# Patient Record
Sex: Female | Born: 1975
Health system: Southern US, Community
[De-identification: ages and names within clinical notes are randomized; demographics above are authoritative.]

## PROBLEM LIST (undated history)

## (undated) DIAGNOSIS — E039 Hypothyroidism, unspecified: Secondary | ICD-10-CM

## (undated) DIAGNOSIS — F329 Major depressive disorder, single episode, unspecified: Secondary | ICD-10-CM

## (undated) DIAGNOSIS — G4730 Sleep apnea, unspecified: Secondary | ICD-10-CM

## (undated) DIAGNOSIS — I10 Essential (primary) hypertension: Secondary | ICD-10-CM

## (undated) DIAGNOSIS — E079 Disorder of thyroid, unspecified: Secondary | ICD-10-CM

## (undated) DIAGNOSIS — IMO0001 Reserved for inherently not codable concepts without codable children: Secondary | ICD-10-CM

## (undated) DIAGNOSIS — F419 Anxiety disorder, unspecified: Secondary | ICD-10-CM

## (undated) DIAGNOSIS — Z460 Encounter for fitting and adjustment of spectacles and contact lenses: Secondary | ICD-10-CM

## (undated) DIAGNOSIS — Z9221 Personal history of antineoplastic chemotherapy: Secondary | ICD-10-CM

## (undated) DIAGNOSIS — IMO0002 Reserved for concepts with insufficient information to code with codable children: Secondary | ICD-10-CM

## (undated) DIAGNOSIS — D219 Benign neoplasm of connective and other soft tissue, unspecified: Secondary | ICD-10-CM

## (undated) DIAGNOSIS — E669 Obesity, unspecified: Secondary | ICD-10-CM

## (undated) DIAGNOSIS — D649 Anemia, unspecified: Secondary | ICD-10-CM

## (undated) DIAGNOSIS — C801 Malignant (primary) neoplasm, unspecified: Secondary | ICD-10-CM

## (undated) DIAGNOSIS — K219 Gastro-esophageal reflux disease without esophagitis: Secondary | ICD-10-CM

## (undated) DIAGNOSIS — F32A Depression, unspecified: Secondary | ICD-10-CM

## (undated) DIAGNOSIS — Z923 Personal history of irradiation: Secondary | ICD-10-CM

## (undated) HISTORY — DX: Reserved for concepts with insufficient information to code with codable children: IMO0002

## (undated) HISTORY — DX: Anemia, unspecified: D64.9

## (undated) HISTORY — PX: BREAST SURGERY: SHX581

## (undated) HISTORY — DX: Reserved for inherently not codable concepts without codable children: IMO0001

## (undated) HISTORY — DX: Disorder of thyroid, unspecified: E07.9

## (undated) HISTORY — DX: Obesity, unspecified: E66.9

## (undated) HISTORY — PX: WISDOM TOOTH EXTRACTION: SHX21

## (undated) HISTORY — DX: Gastro-esophageal reflux disease without esophagitis: K21.9

---

## 2001-01-26 HISTORY — PX: WISDOM TOOTH EXTRACTION: SHX21

## 2007-09-18 ENCOUNTER — Emergency Department (HOSPITAL_COMMUNITY): Admission: EM | Admit: 2007-09-18 | Discharge: 2007-09-18 | Payer: Self-pay | Admitting: Emergency Medicine

## 2009-10-21 ENCOUNTER — Other Ambulatory Visit: Payer: Self-pay | Admitting: Emergency Medicine

## 2009-10-21 ENCOUNTER — Other Ambulatory Visit: Payer: Self-pay | Admitting: Family Medicine

## 2009-10-21 ENCOUNTER — Observation Stay (HOSPITAL_COMMUNITY)
Admission: AD | Admit: 2009-10-21 | Discharge: 2009-10-23 | Payer: Self-pay | Source: Home / Self Care | Admitting: Obstetrics and Gynecology

## 2010-01-26 HISTORY — PX: UTERINE FIBROID SURGERY: SHX826

## 2010-01-26 HISTORY — PX: OTHER SURGICAL HISTORY: SHX169

## 2010-01-28 ENCOUNTER — Encounter
Admission: RE | Admit: 2010-01-28 | Discharge: 2010-01-28 | Payer: Self-pay | Source: Home / Self Care | Attending: Obstetrics and Gynecology | Admitting: Obstetrics and Gynecology

## 2010-02-06 ENCOUNTER — Encounter
Admission: RE | Admit: 2010-02-06 | Discharge: 2010-02-06 | Payer: Self-pay | Source: Home / Self Care | Attending: Obstetrics and Gynecology | Admitting: Obstetrics and Gynecology

## 2010-02-24 ENCOUNTER — Other Ambulatory Visit (HOSPITAL_COMMUNITY): Payer: Self-pay | Admitting: Interventional Radiology

## 2010-02-24 DIAGNOSIS — D219 Benign neoplasm of connective and other soft tissue, unspecified: Secondary | ICD-10-CM

## 2010-02-26 ENCOUNTER — Other Ambulatory Visit (HOSPITAL_COMMUNITY): Payer: Self-pay

## 2010-03-05 ENCOUNTER — Ambulatory Visit (HOSPITAL_COMMUNITY): Admission: RE | Admit: 2010-03-05 | Payer: 59 | Source: Home / Self Care | Admitting: Obstetrics and Gynecology

## 2010-03-07 ENCOUNTER — Ambulatory Visit (HOSPITAL_COMMUNITY)
Admission: RE | Admit: 2010-03-07 | Discharge: 2010-03-07 | Disposition: A | Discharge status: Home / Self Care | Payer: 59 | Source: Ambulatory Visit | Attending: Interventional Radiology | Admitting: Interventional Radiology

## 2010-03-07 DIAGNOSIS — D259 Leiomyoma of uterus, unspecified: Secondary | ICD-10-CM | POA: Insufficient documentation

## 2010-03-07 DIAGNOSIS — Z01812 Encounter for preprocedural laboratory examination: Secondary | ICD-10-CM | POA: Insufficient documentation

## 2010-03-07 LAB — CBC
HCT: 42.1 % (ref 36.0–46.0)
Hemoglobin: 14.4 g/dL (ref 12.0–15.0)
WBC: 5.9 10*3/uL (ref 4.0–10.5)

## 2010-03-07 LAB — CREATININE, SERUM: GFR calc non Af Amer: >60 mL/min (ref >60)

## 2010-03-10 ENCOUNTER — Observation Stay (HOSPITAL_COMMUNITY)
Admission: RE | Admit: 2010-03-10 | Discharge: 2010-03-11 | Disposition: A | Discharge status: Home / Self Care | Payer: 59 | Source: Ambulatory Visit | Attending: Interventional Radiology | Admitting: Interventional Radiology

## 2010-03-10 ENCOUNTER — Encounter (HOSPITAL_COMMUNITY): Payer: Self-pay

## 2010-03-10 DIAGNOSIS — N920 Excessive and frequent menstruation with regular cycle: Secondary | ICD-10-CM | POA: Insufficient documentation

## 2010-03-10 DIAGNOSIS — D259 Leiomyoma of uterus, unspecified: Principal | ICD-10-CM | POA: Insufficient documentation

## 2010-03-10 DIAGNOSIS — D219 Benign neoplasm of connective and other soft tissue, unspecified: Secondary | ICD-10-CM

## 2010-03-10 HISTORY — DX: Benign neoplasm of connective and other soft tissue, unspecified: D21.9

## 2010-03-10 MED ORDER — IOHEXOL 300 MG/ML  SOLN
200.0000 mL | Freq: Once | INTRAMUSCULAR | Status: AC | PRN
  Administered 2010-03-10: 150 mL via INTRAVENOUS

## 2010-03-10 MED ORDER — IOHEXOL 300 MG/ML  SOLN: 150.0000 mL | Freq: Once | INTRAMUSCULAR | Status: AC | PRN

## 2010-03-12 ENCOUNTER — Other Ambulatory Visit: Payer: Self-pay | Admitting: Interventional Radiology

## 2010-03-12 DIAGNOSIS — D219 Benign neoplasm of connective and other soft tissue, unspecified: Secondary | ICD-10-CM

## 2010-03-13 ENCOUNTER — Telehealth: Payer: Self-pay | Admitting: Radiology

## 2010-03-25 ENCOUNTER — Ambulatory Visit
Admission: RE | Admit: 2010-03-25 | Discharge: 2010-03-25 | Disposition: A | Discharge status: Home / Self Care | Payer: 59 | Source: Ambulatory Visit | Attending: Interventional Radiology | Admitting: Interventional Radiology

## 2010-03-25 VITALS — BP 153/89 | HR 62 | Temp 99.3°F | Resp 14

## 2010-03-25 DIAGNOSIS — D219 Benign neoplasm of connective and other soft tissue, unspecified: Secondary | ICD-10-CM

## 2010-03-26 NOTE — Progress Notes (Signed)
Patient states that she has been taking IBU 400 mg x 7 days for mild discomfort.  Has been experiencing some spotting, denies foul odor.  Afebrile.

## 2010-04-10 LAB — URINALYSIS, ROUTINE W REFLEX MICROSCOPIC
Bilirubin Urine: NEGATIVE
Glucose, UA: NEGATIVE mg/dL
Ketones, ur: 15 mg/dL — AB
pH: 6 (ref 5.0–8.0)

## 2010-04-10 LAB — BASIC METABOLIC PANEL
CO2: 15 mEq/L — ABNORMAL LOW (ref 19–32)
Chloride: 110 mEq/L (ref 96–112)
GFR calc Af Amer: >60 mL/min (ref >60)
Potassium: 3.3 mEq/L — ABNORMAL LOW (ref 3.5–5.1)
Sodium: 138 mEq/L (ref 135–145)

## 2010-04-10 LAB — CBC
HCT: 12.3 % — ABNORMAL LOW (ref 36.0–46.0)
HCT: 22.4 % — ABNORMAL LOW (ref 36.0–46.0)
Hemoglobin: 1.7 g/dL — CL (ref 12.0–15.0)
MCH: 13.5 pg — ABNORMAL LOW (ref 26.0–34.0)
MCH: 25.4 pg — ABNORMAL LOW (ref 26.0–34.0)
MCHC: 32.4 g/dL (ref 30.0–36.0)
MCV: 50.8 fL — ABNORMAL LOW (ref 78.0–100.0)
MCV: 77.1 fL — ABNORMAL LOW (ref 78.0–100.0)
Platelets: 353 10*3/uL (ref 150–400)
Platelets: 327 10*3/uL (ref 150–400)
Platelets: 364 10*3/uL (ref 150–400)
RBC: 1.24 MIL/uL — ABNORMAL LOW (ref 3.87–5.11)
RBC: 2.91 MIL/uL — ABNORMAL LOW (ref 3.87–5.11)
RDW: 37.9 % — ABNORMAL HIGH (ref 11.5–15.5)
RDW: 32 % — ABNORMAL HIGH (ref 11.5–15.5)
RDW: 36 % — ABNORMAL HIGH (ref 11.5–15.5)
WBC: 4.3 10*3/uL (ref 4.0–10.5)
WBC: 4.3 10*3/uL (ref 4.0–10.5)
WBC: 4.7 10*3/uL (ref 4.0–10.5)

## 2010-04-10 LAB — DIFFERENTIAL
Basophils Absolute: 0 10*3/uL (ref 0.0–0.1)
Basophils Relative: 0 % (ref 0–1)
Lymphocytes Relative: 26 % (ref 12–46)
Neutro Abs: 3.1 10*3/uL (ref 1.7–7.7)
Neutrophils Relative %: 67 % (ref 43–77)

## 2010-04-10 LAB — COMPREHENSIVE METABOLIC PANEL
Albumin: 3.1 g/dL — ABNORMAL LOW (ref 3.5–5.2)
Alkaline Phosphatase: 49 U/L (ref 39–117)
BUN: 3 mg/dL — ABNORMAL LOW (ref 6–23)
Chloride: 116 mEq/L — ABNORMAL HIGH (ref 96–112)
Creatinine, Ser: 0.72 mg/dL (ref 0.4–1.2)
GFR calc non Af Amer: >60 mL/min (ref >60)
Glucose, Bld: 90 mg/dL (ref 70–99)
Potassium: 3.5 mEq/L (ref 3.5–5.1)
Total Bilirubin: 2.9 mg/dL — ABNORMAL HIGH (ref 0.3–1.2)

## 2010-04-10 LAB — CROSSMATCH
ABO/RH(D): O POS
Antibody Screen: NEGATIVE

## 2010-04-10 LAB — ABO/RH
ABO/RH(D): O POS
ABO/RH(D): O POS

## 2010-04-10 LAB — HEMOGLOBINOPATHY EVALUATION
Hemoglobin Other: 0 % (ref 0.0–0.0)
Hgb A2 Quant: 2.5 % (ref 2.2–3.2)

## 2010-04-10 LAB — PROTIME-INR
INR: 1.14 (ref 0.00–1.49)
Prothrombin Time: 14.8 seconds (ref 11.6–15.2)

## 2010-04-10 LAB — POCT PREGNANCY, URINE: Preg Test, Ur: NEGATIVE

## 2010-05-14 ENCOUNTER — Telehealth: Payer: Self-pay | Admitting: Radiology

## 2010-06-05 ENCOUNTER — Telehealth: Payer: Self-pay | Admitting: Radiology

## 2010-06-05 NOTE — Telephone Encounter (Signed)
Left phone message requesting pt call re: s/p [place]

## 2010-06-10 ENCOUNTER — Other Ambulatory Visit: Payer: Self-pay | Admitting: Interventional Radiology

## 2010-06-10 DIAGNOSIS — D219 Benign neoplasm of connective and other soft tissue, unspecified: Secondary | ICD-10-CM

## 2010-06-17 ENCOUNTER — Ambulatory Visit
Admission: RE | Admit: 2010-06-17 | Discharge: 2010-06-17 | Disposition: A | Discharge status: Home / Self Care | Payer: 59 | Source: Ambulatory Visit | Attending: Interventional Radiology | Admitting: Interventional Radiology

## 2010-06-17 VITALS — BP 140/85 | HR 59 | Resp 14

## 2010-06-17 DIAGNOSIS — D219 Benign neoplasm of connective and other soft tissue, unspecified: Secondary | ICD-10-CM

## 2010-07-04 NOTE — Telephone Encounter (Signed)
See above telephone encounter note

## 2010-07-17 NOTE — Telephone Encounter (Signed)
See above note

## 2010-08-19 ENCOUNTER — Other Ambulatory Visit: Payer: Self-pay | Admitting: Interventional Radiology

## 2010-08-19 DIAGNOSIS — D219 Benign neoplasm of connective and other soft tissue, unspecified: Secondary | ICD-10-CM

## 2010-09-01 ENCOUNTER — Other Ambulatory Visit: Payer: Self-pay | Admitting: Obstetrics and Gynecology

## 2010-09-01 DIAGNOSIS — D219 Benign neoplasm of connective and other soft tissue, unspecified: Secondary | ICD-10-CM

## 2010-09-23 ENCOUNTER — Ambulatory Visit
Admission: RE | Admit: 2010-09-23 | Discharge: 2010-09-23 | Disposition: A | Discharge status: Home / Self Care | Payer: 59 | Source: Ambulatory Visit | Attending: Interventional Radiology | Admitting: Interventional Radiology

## 2010-09-23 ENCOUNTER — Ambulatory Visit
Admission: RE | Admit: 2010-09-23 | Discharge: 2010-09-23 | Disposition: A | Discharge status: Home / Self Care | Payer: 59 | Source: Ambulatory Visit | Attending: Obstetrics and Gynecology | Admitting: Obstetrics and Gynecology

## 2010-09-23 VITALS — BP 155/88 | HR 61 | Temp 98.1°F | Resp 16

## 2010-09-23 DIAGNOSIS — D219 Benign neoplasm of connective and other soft tissue, unspecified: Secondary | ICD-10-CM

## 2010-09-23 MED ORDER — GADOBENATE DIMEGLUMINE 529 MG/ML IV SOLN
20.0000 mL | Freq: Once | INTRAVENOUS | Status: AC | PRN
  Administered 2010-09-23: 20 mL via INTRAVENOUS

## 2010-09-23 NOTE — Progress Notes (Signed)
LMP:  09/11/2010.  Length of menstrual cycles post [place]: 5-6 days, minimal small clots.  Minimal cramping w/ cycles.  Minimal occasional bloating.    States that she often experiences some cramping discomfort the week after cycles for several days.  Takes Ibuprofen as needed.

## 2011-01-27 HISTORY — PX: EYE SURGERY: SHX253

## 2011-01-28 ENCOUNTER — Encounter (INDEPENDENT_AMBULATORY_CARE_PROVIDER_SITE_OTHER): Payer: 59 | Admitting: Ophthalmology

## 2011-01-28 DIAGNOSIS — H43819 Vitreous degeneration, unspecified eye: Secondary | ICD-10-CM

## 2011-01-28 DIAGNOSIS — H33309 Unspecified retinal break, unspecified eye: Secondary | ICD-10-CM

## 2011-01-28 DIAGNOSIS — H2510 Age-related nuclear cataract, unspecified eye: Secondary | ICD-10-CM

## 2011-01-28 DIAGNOSIS — H35419 Lattice degeneration of retina, unspecified eye: Secondary | ICD-10-CM

## 2011-01-28 DIAGNOSIS — H5210 Myopia, unspecified eye: Secondary | ICD-10-CM

## 2011-02-04 ENCOUNTER — Ambulatory Visit (INDEPENDENT_AMBULATORY_CARE_PROVIDER_SITE_OTHER): Payer: 59 | Admitting: Ophthalmology

## 2011-02-04 DIAGNOSIS — H33309 Unspecified retinal break, unspecified eye: Secondary | ICD-10-CM

## 2011-02-09 ENCOUNTER — Encounter (INDEPENDENT_AMBULATORY_CARE_PROVIDER_SITE_OTHER): Payer: 59 | Admitting: Ophthalmology

## 2011-02-09 DIAGNOSIS — H33309 Unspecified retinal break, unspecified eye: Secondary | ICD-10-CM

## 2011-02-19 ENCOUNTER — Other Ambulatory Visit: Payer: Self-pay | Admitting: Obstetrics and Gynecology

## 2011-02-19 ENCOUNTER — Ambulatory Visit (INDEPENDENT_AMBULATORY_CARE_PROVIDER_SITE_OTHER): Payer: 59 | Admitting: Ophthalmology

## 2011-02-19 DIAGNOSIS — H33309 Unspecified retinal break, unspecified eye: Secondary | ICD-10-CM

## 2011-02-19 DIAGNOSIS — R928 Other abnormal and inconclusive findings on diagnostic imaging of breast: Secondary | ICD-10-CM

## 2011-02-20 ENCOUNTER — Ambulatory Visit
Admission: RE | Admit: 2011-02-20 | Discharge: 2011-02-20 | Disposition: A | Discharge status: Home / Self Care | Payer: 59 | Source: Ambulatory Visit | Attending: Obstetrics and Gynecology | Admitting: Obstetrics and Gynecology

## 2011-02-20 DIAGNOSIS — R928 Other abnormal and inconclusive findings on diagnostic imaging of breast: Secondary | ICD-10-CM

## 2011-06-19 ENCOUNTER — Ambulatory Visit (INDEPENDENT_AMBULATORY_CARE_PROVIDER_SITE_OTHER): Payer: 59 | Admitting: Ophthalmology

## 2011-06-26 ENCOUNTER — Ambulatory Visit (INDEPENDENT_AMBULATORY_CARE_PROVIDER_SITE_OTHER): Payer: 59 | Admitting: Ophthalmology

## 2011-06-26 DIAGNOSIS — H33309 Unspecified retinal break, unspecified eye: Secondary | ICD-10-CM

## 2011-06-26 DIAGNOSIS — H2510 Age-related nuclear cataract, unspecified eye: Secondary | ICD-10-CM

## 2011-06-26 DIAGNOSIS — H5210 Myopia, unspecified eye: Secondary | ICD-10-CM

## 2011-06-26 DIAGNOSIS — H43819 Vitreous degeneration, unspecified eye: Secondary | ICD-10-CM

## 2011-08-14 ENCOUNTER — Encounter: Payer: Self-pay | Admitting: Obstetrics and Gynecology

## 2012-06-30 ENCOUNTER — Ambulatory Visit (INDEPENDENT_AMBULATORY_CARE_PROVIDER_SITE_OTHER): Payer: 59 | Admitting: Ophthalmology

## 2012-07-06 ENCOUNTER — Ambulatory Visit (INDEPENDENT_AMBULATORY_CARE_PROVIDER_SITE_OTHER): Payer: 59 | Admitting: Ophthalmology

## 2012-07-06 DIAGNOSIS — H5210 Myopia, unspecified eye: Secondary | ICD-10-CM

## 2012-07-06 DIAGNOSIS — H43819 Vitreous degeneration, unspecified eye: Secondary | ICD-10-CM

## 2012-07-06 DIAGNOSIS — H33309 Unspecified retinal break, unspecified eye: Secondary | ICD-10-CM

## 2012-07-06 DIAGNOSIS — H35419 Lattice degeneration of retina, unspecified eye: Secondary | ICD-10-CM

## 2013-01-26 DIAGNOSIS — C801 Malignant (primary) neoplasm, unspecified: Secondary | ICD-10-CM

## 2013-01-26 HISTORY — DX: Malignant (primary) neoplasm, unspecified: C80.1

## 2013-06-26 ENCOUNTER — Other Ambulatory Visit: Payer: Self-pay | Admitting: Obstetrics and Gynecology

## 2013-06-26 DIAGNOSIS — R928 Other abnormal and inconclusive findings on diagnostic imaging of breast: Secondary | ICD-10-CM

## 2013-07-06 ENCOUNTER — Encounter (INDEPENDENT_AMBULATORY_CARE_PROVIDER_SITE_OTHER): Payer: Self-pay

## 2013-07-06 ENCOUNTER — Other Ambulatory Visit: Payer: Self-pay | Admitting: Obstetrics and Gynecology

## 2013-07-06 ENCOUNTER — Ambulatory Visit
Admission: RE | Admit: 2013-07-06 | Discharge: 2013-07-06 | Disposition: A | Discharge status: Home / Self Care | Payer: 59 | Source: Ambulatory Visit | Attending: Obstetrics and Gynecology | Admitting: Obstetrics and Gynecology

## 2013-07-06 DIAGNOSIS — R928 Other abnormal and inconclusive findings on diagnostic imaging of breast: Secondary | ICD-10-CM

## 2013-07-17 ENCOUNTER — Ambulatory Visit
Admission: RE | Admit: 2013-07-17 | Discharge: 2013-07-17 | Disposition: A | Discharge status: Home / Self Care | Payer: 59 | Source: Ambulatory Visit | Attending: Obstetrics and Gynecology | Admitting: Obstetrics and Gynecology

## 2013-07-17 DIAGNOSIS — R928 Other abnormal and inconclusive findings on diagnostic imaging of breast: Secondary | ICD-10-CM

## 2013-07-18 ENCOUNTER — Other Ambulatory Visit: Payer: Self-pay | Admitting: Obstetrics and Gynecology

## 2013-07-18 DIAGNOSIS — C50919 Malignant neoplasm of unspecified site of unspecified female breast: Secondary | ICD-10-CM

## 2013-07-24 ENCOUNTER — Telehealth: Payer: Self-pay | Admitting: Genetic Counselor

## 2013-07-24 ENCOUNTER — Ambulatory Visit (INDEPENDENT_AMBULATORY_CARE_PROVIDER_SITE_OTHER): Payer: 59 | Admitting: Surgery

## 2013-07-24 ENCOUNTER — Encounter (INDEPENDENT_AMBULATORY_CARE_PROVIDER_SITE_OTHER): Payer: Self-pay | Admitting: Surgery

## 2013-07-24 VITALS — BP 118/85 | HR 77 | Temp 97.8°F | Resp 16 | Ht 69.0 in | Wt 219.0 lb

## 2013-07-24 DIAGNOSIS — D0590 Unspecified type of carcinoma in situ of unspecified breast: Secondary | ICD-10-CM

## 2013-07-24 DIAGNOSIS — D0511 Intraductal carcinoma in situ of right breast: Secondary | ICD-10-CM

## 2013-07-24 NOTE — Patient Instructions (Signed)
Sentinel Lymph Node Biopsy Sentinel lymph node biopsy is a procedure in which a single lymph node is identified, removed, and examined for cancer. Lymph nodes are collections of tissue that help filter infections, cancer cells, and other waste substances from the bloodstream. Certain types of cancer can spread to nearby lymph nodes. The cancer spreads to one lymph node first, and then to others. The first lymph node that your cancer could spread to is called the sentinel lymph node. Examining the sentinel lymph node for cancer can help your caregiver plan future treatment for you. LET YOUR CAREGIVER KNOW ABOUT:   Allergies to food or medicine.  Medicines taken, including vitamins, herbs, eyedrops, over-the-counter medicines, and creams.  Use of steroids (by mouth or creams).  Previous problems with numbing medicines.  History of bleeding problems or blood clots.  Previous surgery.  Other health problems, including diabetes and kidney problems.  Possibility of pregnancy, if this applies. RISKS AND COMPLICATIONS   Infection.  Bleeding.  Allergic reaction to the dye used for the procedure.  Blue staining of the skin where the dye is injected.  Damaged lymph vessels, causing a buildup of fluid (lymphedema).  Pain or bruising at the biopsy site. BEFORE THE PROCEDURE   Stop smoking at least 2 weeks before the procedure. Not smoking will improve your health after the procedure and decrease the chance of getting a wound infection.  You may have blood tests to make sure your blood clots normally.  Ask your caregiver about changing or stopping your regular medicines.  Do not eat or drink anything for 8 hours before the procedure. PROCEDURE   You will be given medicine that makes you sleep (general anesthetic).  A blue, radioactive dye will be injected near the tumor. The dye will then spread into the sentinel lymph node.  A scanner will identify the sentinel lymph node.  A  small cut (incision) will be made, and the sentinel lymph node will be removed.  The sentinel lymph node will be examined in a lab. Sometimes, a sentinel lymph node biopsy is performed during another surgery, such as a mastectomy or lumpectomy for breast cancer.  AFTER THE PROCEDURE   You will go to a recovery room.  You will be monitored for several hours.  If complications do not occur, you will be allowed to go home a few hours after the procedure.  Your urine may be blue for the next 24 hours. This is normal. It is caused by the dye used during the procedure.  Your skin where the dye was injected may be blue for up to 8 weeks. Document Released: 04/06/2011 Document Reviewed: 04/06/2011 [facility] Patient Information 2015 [place]. This information is not intended to replace advice given to you by your health care provider. Make sure you discuss any questions you have with your health care provider. Lumpectomy A lumpectomy is a form of "breast conserving" or "breast preservation" surgery. It may also be referred to as a partial mastectomy. During a lumpectomy, the portion of the breast that contains the cancerous tumor or breast mass (the lump) is removed. Some normal tissue around the lump may also be removed to make sure all the tumor has been removed. This surgery should take 40 minutes or less. LET [facility] CARE PROVIDER KNOW ABOUT:  Any allergies you have.  All medicines you are taking, including vitamins, herbs, eye drops, creams, and over-the-counter medicines.  Previous problems you or members of your family have had with the  use of anesthetics.  Any blood disorders you have.  Previous surgeries you have had.  Medical conditions you have. RISKS AND COMPLICATIONS Generally, this is a safe procedure. However, as with any procedure, complications can occur. Possible complications include:  Bleeding.  Infection.  Pain.  Temporary swelling.  Change in the  shape of the breast, particularly if a large portion is removed. BEFORE THE PROCEDURE  Ask your health care provider about changing or stopping your regular medicines.  Do not eat or drink anything for 7-8 hours before the surgery or as directed by your health care provider. Ask your health care provider if you can take a sip of water with any approved medicines.  On the day of surgery, your healthcare provider will use a mammogram or ultrasound to locate and mark the tumor in your breast. These markings on your breast will show where the cut (incision) will be made. PROCEDURE   An IV tube will be put into one of your veins.  You may be given medicine to help you relax before the surgery (sedative). You will be given one of the following:  A medicine that numbs the area (local anesthesia).  A medicine that makes you go to sleep (general anesthesia).  Your health care provider will use a kind of electric scalpel that uses heat to minimize bleeding (electrocautery knife).  A curved incision (like a smile or frown) that follows the natural curve of your breast is made, to allow for minimal scarring and better healing.  The tumor will be removed with some of the surrounding tissue. This will be sent to the lab for analysis. Your health care provider may also remove your lymph nodes at this time if needed.  Sometimes, but not always, a rubber tube called a drain will be surgically inserted into your breast area or armpit to collect excess fluid that may accumulate in the space where the tumor was. This drain is connected to a plastic bulb on the outside of your body. This drain creates suction to help remove the fluid.  The incisions will be closed with stitches (sutures).  A bandage may be placed over the incisions. AFTER THE PROCEDURE  You will be taken to the recovery area.  You will be given medicine for pain.  A small rubber drain may be placed in the breast for 2-3 days to prevent  a collection of blood (hematoma) from developing in the breast. You will be given instructions on caring for the drain before you go home.  A pressure bandage (dressing) will be applied for 1-2 days to prevent bleeding. Ask your health care provider how to care for your bandage at home. Document Released: 02/23/2006 Document Revised: 09/14/2012 Document Reviewed: 06/17/2012 [facility] Patient Information 2015 [place], [place]. This information is not intended to replace advice given to you by your health care provider. Make sure you discuss any questions you have with your health care provider.

## 2013-07-24 NOTE — Progress Notes (Signed)
Patient ID: Jennifer Frazier, female   DOB: [birth year], 38 y.o.   MRN: [id number]  Chief Complaint  Patient presents with  . Mass    rt breast    HPI Jennifer Frazier is a 38 y.o. female.   HPI Patient sent at the request of Dr. Lovey Newcomer of the [place] for right breast microcalcifications. This is picked up on her second mammogram. The area measures 1 cm in maximal diameter in the upper-outer quadrant. These were felt to be suspicious were biopsied. Results showed DCIS with comedonecrosis high grade. Patient denies any history of breast pain, nipple discharge or breast mass. Patient has first degree and with breast cancer. She is to grandmothers with ovarian cancer.MRI set up for Tuesday.  Past Medical History  Diagnosis Date  . Fibroids     uterine fibroids  . Anemia   . Thyroid disease     Past Surgical History  Procedure Laterality Date  . [place]  2012  . Eye surgery  2013    Family History  Problem Relation Age of Onset  . Cancer Father     colon  . Cancer Maternal Aunt     breast cancer  . Cancer Paternal Aunt     breast cancer  . Cancer Maternal Grandmother     ovarian cancer  . Cancer Paternal Grandmother     ovarian cancer    Social History History  Substance Use Topics  . Smoking status: Never Smoker   . Smokeless tobacco: Not on file  . Alcohol Use: No    Allergies  Allergen Reactions  . Gadolinium      Desc: NAUSEA WITH MRI CONTRAST-NO VOMITING   . Tramadol Hives and Nausea Only    Current Outpatient Prescriptions  Medication Sig Dispense Refill  . Barberry-Oreg Grape-Goldenseal (BERBERINE COMPLEX PO) Take 500 mg by mouth 3 (three) times daily.      . Cholecalciferol (VITAMIN D3) 3000 UNITS TABS Take 10,000 Units by mouth daily.      . magnesium gluconate (MAGONATE) 500 MG tablet Take 400 mg by mouth Nightly.      . progesterone 200 MG SUPP Place 200 mg vaginally at bedtime.      . promethazine (PHENERGAN) 25 MG tablet Take 25 mg by  mouth every 6 (six) hours as needed for nausea or vomiting.      . thyroid (ARMOUR) 130 MG tablet Take 130 mg by mouth daily.      . vitamin B-12 (CYANOCOBALAMIN) 1000 MCG tablet Take 5,000 mcg by mouth daily.       No current facility-administered medications for this visit.    Review of Systems Review of Systems  Constitutional: Negative for fever, chills and unexpected weight change.  HENT: Negative for congestion, hearing loss, sore throat, trouble swallowing and voice change.   Eyes: Negative for visual disturbance.  Respiratory: Negative for cough and wheezing.   Cardiovascular: Negative for chest pain, palpitations and leg swelling.  Gastrointestinal: Negative for nausea, vomiting, abdominal pain, diarrhea, constipation, blood in stool, abdominal distention and anal bleeding.  Genitourinary: Negative for hematuria, vaginal bleeding and difficulty urinating.  Musculoskeletal: Negative for arthralgias.  Skin: Negative for rash and wound.  Neurological: Negative for seizures, syncope and headaches.  Hematological: Negative for adenopathy. Does not bruise/bleed easily.  Psychiatric/Behavioral: Negative for confusion.    Blood pressure 118/85, pulse 77, temperature 97.8 F (36.6 C), resp. rate 16, height 5\' 9"  (1.753 m), weight 219 lb (99.338 kg).  Physical  Exam Physical Exam  Constitutional: She is oriented to person, place, and time. She appears well-developed and well-nourished.  HENT:  Head: Normocephalic and atraumatic.  Eyes: Pupils are equal, round, and reactive to light. No scleral icterus.  Neck: Normal range of motion. Neck supple.  Cardiovascular: Normal rate and regular rhythm.   Pulmonary/Chest: Effort normal and breath sounds normal. Right breast exhibits no inverted nipple, no mass, no nipple discharge, no skin change and no tenderness. Left breast exhibits no inverted nipple, no mass, no nipple discharge, no skin change and no tenderness. Breasts are symmetrical.      Musculoskeletal: Normal range of motion.  Lymphadenopathy:    She has no cervical adenopathy.  Neurological: She is alert and oriented to person, place, and time.  Skin: Skin is warm and dry.  Psychiatric: She has a normal mood and affect. Her behavior is normal. Judgment and thought content normal.    Data Reviewed CLINICAL DATA: Patient recalled from screening for right breast  calcifications.  EXAM:  DIGITAL DIAGNOSTIC RIGHT MAMMOGRAM  COMPARISON: [facility]  ACR Breast Density Category c: The breast tissue is heterogeneously  dense, which may obscure small masses.  FINDINGS:  Within the upper-outer right breast posterior depth there is a 12 x  5 x 4 mm group of a amorphous calcifications, further evaluated with  spot compression magnification views.  IMPRESSION:  Suspicious right breast calcifications.  RECOMMENDATION:  Stereotactic guided core needle biopsy.  Biopsy scheduled for 07/17/2013 at 3 p.m.  I have discussed the findings and recommendations with the patient.  Results were also provided in writing at the conclusion of the  visit. If applicable, a reminder letter will be sent to the patient  regarding the next appointment.  BI-RADS CATEGORY 4: Suspicious.  Electronically Signed  By: Lovey Newcomer M.D.  On: 07/06/2013 15:32    Assessment    Right breast DCIS with comedonecrosis high-grade upper outer quadrant  Family history of breast cancer    Plan    Discussed breast conservation versus mastectomy and reconstruction. I feel she is a good lumpectomy candidate. MRI is for tomorrow. As long as there are no new findings on her MRI, she would like to proceed with breast conservation. We'll set up for right breast lumpectomy seed localized and sentinel lymph node mapping given the location and high-grade features.The procedure has been discussed with the patient. Alternatives to surgery have been discussed with the patient.  Risks of surgery include bleeding,   Infection,  Seroma formation, death,  and the need for further surgery.   The patient understands and wishes to proceed.Sentinel lymph node mapping and dissection has been discussed with the patient.  Risk of bleeding,  Infection,  Seroma formation,  Additional procedures,,  Shoulder weakness ,  Shoulder stiffness,  Nerve and blood vessel injury and reaction to the mapping dyes have been discussed.  Alternatives to surgery have been discussed with the patient.  The patient agrees to proceed.   Needs genetics.     CORNETT,THOMAS A. 07/24/2013, 12:23 PM

## 2013-07-24 NOTE — Telephone Encounter (Signed)
LEFT MESSAGE FOR PATIENT RETURN CALL TO SCHEDULE GENETIC APPT.

## 2013-07-25 ENCOUNTER — Ambulatory Visit
Admission: RE | Admit: 2013-07-25 | Discharge: 2013-07-25 | Disposition: A | Discharge status: Home / Self Care | Payer: 59 | Source: Ambulatory Visit | Attending: Obstetrics and Gynecology | Admitting: Obstetrics and Gynecology

## 2013-07-25 ENCOUNTER — Telehealth: Payer: Self-pay | Admitting: *Deleted

## 2013-07-25 DIAGNOSIS — C50919 Malignant neoplasm of unspecified site of unspecified female breast: Secondary | ICD-10-CM

## 2013-07-25 MED ORDER — GADOBENATE DIMEGLUMINE 529 MG/ML IV SOLN
20.0000 mL | Freq: Once | INTRAVENOUS | Status: AC | PRN
  Administered 2013-07-25: 20 mL via INTRAVENOUS

## 2013-07-25 NOTE — Telephone Encounter (Signed)
Left message for pt to return my call so I can schedule a genetic appt.  

## 2013-07-26 ENCOUNTER — Telehealth: Payer: Self-pay | Admitting: *Deleted

## 2013-07-26 NOTE — Telephone Encounter (Signed)
Pt returned my call and I confirmed 08/02/13 genetic appt w/ pt.  Emailed Arts development officer at [employer] to make her aware.

## 2013-07-31 ENCOUNTER — Other Ambulatory Visit (INDEPENDENT_AMBULATORY_CARE_PROVIDER_SITE_OTHER): Payer: Self-pay | Admitting: Surgery

## 2013-07-31 DIAGNOSIS — D0511 Intraductal carcinoma in situ of right breast: Secondary | ICD-10-CM

## 2013-08-01 ENCOUNTER — Telehealth (INDEPENDENT_AMBULATORY_CARE_PROVIDER_SITE_OTHER): Payer: Self-pay

## 2013-08-01 NOTE — Telephone Encounter (Signed)
Message copied by [name] Coria on Tue Aug 01, 2013  1:57 PM ------      Message from: Joya San      Created: Mon Jul 31, 2013 10:37 AM      Contact: [phone]       She has a question concerning sx as health wise. ------

## 2013-08-01 NOTE — Telephone Encounter (Signed)
Called pt back regarding her concerns. She asked what the seed placement was for. Explained the radiologist will place seed next to the clip that was left behind when she had her biopsy done. When she gets to the OR, Dr Brantley Stage has a special tool that will let him know where the seed it and that is how he will know where the incision is and the area that needs to be taken out. She asked how much time she would need to be out of work. Advised it is normally 1 to 2 weeks but can be sooner if she feels up to it. Asked about her being estrogen positive if she would need her ovaries out. I let her know the oncologists will address that with her when she gets in to see them. Let her know she needs to have someone drive her and take her home after surgery. Advised her to bring FMLA paperwork up here and we would be glad to fill them out. Let her know she could call me back if she has anymore questions.

## 2013-08-02 ENCOUNTER — Other Ambulatory Visit: Payer: 59

## 2013-08-02 ENCOUNTER — Ambulatory Visit (HOSPITAL_BASED_OUTPATIENT_CLINIC_OR_DEPARTMENT_OTHER): Payer: 59 | Admitting: Genetic Counselor

## 2013-08-02 DIAGNOSIS — Z803 Family history of malignant neoplasm of breast: Secondary | ICD-10-CM

## 2013-08-02 DIAGNOSIS — IMO0002 Reserved for concepts with insufficient information to code with codable children: Secondary | ICD-10-CM

## 2013-08-02 DIAGNOSIS — D0510 Intraductal carcinoma in situ of unspecified breast: Secondary | ICD-10-CM | POA: Insufficient documentation

## 2013-08-02 DIAGNOSIS — D0511 Intraductal carcinoma in situ of right breast: Secondary | ICD-10-CM

## 2013-08-02 DIAGNOSIS — D0590 Unspecified type of carcinoma in situ of unspecified breast: Secondary | ICD-10-CM

## 2013-08-02 DIAGNOSIS — Z800 Family history of malignant neoplasm of digestive organs: Secondary | ICD-10-CM

## 2013-08-02 NOTE — Progress Notes (Signed)
HISTORY OF PRESENT ILLNESS: Dr. Hilma Favors requested a cancer genetics consultation for Jennifer Frazier, a 38 y.o. female, due to a personal and family history of cancer.  Jennifer Frazier presents to clinic today to discuss the possibility of a hereditary predisposition to cancer, genetic testing, and to further clarify her future cancer risks, as well as potential cancer risk for family members. Jennifer Frazier was recently diagnosed with DCIS of the right breast. She is planning lumpectomy on 08/08/2013 and radiation will follow. She has no history of other cancer.   Past Medical History  Diagnosis Date   Fibroids     uterine fibroids   Anemia    Thyroid disease     Past Surgical History  Procedure Laterality Date   [place]  2012   Eye surgery  2013    History   Social History   Marital Status: Single    Spouse Name: N/A    Number of Children: N/A   Years of Education: N/A   Social History Main Topics   Smoking status: Never Smoker    Smokeless tobacco: Not on file   Alcohol Use: No   Drug Use: No   Sexual Activity: Not on file   Other Topics Concern   Not on file   Social History Narrative   No narrative on file     FAMILY HISTORY:  During the visit, a 4-generation pedigree was obtained. Significant diagnoses include the following:  Family History  Problem Relation Age of Onset   Cancer Father 17    colon   Cancer Maternal Aunt 39    breast cancer   Cancer Paternal Aunt 69    breast cancer   Cancer Maternal Grandmother 71    ovarian or uterine cancer   Cancer Paternal Grandmother     unknown primary    Jennifer Frazier's ancestry is of African American descent. There is no known Jewish ancestry or consanguinity.  GENETIC COUNSELING ASSESSMENT: Jennifer Frazier is a 38 y.o. female with a personal and family history of cancer suggestive of a hereditary predisposition to cancer. We, therefore, discussed and recommended the following at today's visit.   DISCUSSION: We  reviewed the characteristics, features and inheritance patterns of hereditary cancer syndromes. We also discussed genetic testing, including the appropriate family members to test, the process of testing, insurance coverage and turn-around-time for results. We discussed the implications of a negative, positive and/or variant of uncertain significant result. We recommended Jennifer Frazier pursue genetic testing for the [employer] gene panel.   PLAN: Based on our above recommendation, Jennifer Frazier wished to pursue genetic testing and the blood sample was drawn and will be sent to [facility] for analysis. Results should be available within approximately 6 weeks time, at which point they will be disclosed by telephone to Jennifer Frazier, as will any additional recommendations warranted by these results. We also encouraged Jennifer Frazier to remain in contact with cancer genetics annually so that we can continuously update the family history and inform her of any changes in cancer genetics and testing that may be of benefit for this family. Ms.  Frazier's questions were answered to her satisfaction today. Our contact information was provided should additional questions or concerns arise.   Thank you for the referral and allowing [name] to share in the care of your patient.   The patient was seen for a total of 45 minutes in face-to-face genetic counseling.  This patient was discussed with Magrinat who agrees with the  above.    _______________________________________________________________________ For Office Staff:  Number of people involved in session: 3 Was an Intern/ student involved with case: not applicable

## 2013-08-03 ENCOUNTER — Encounter (HOSPITAL_BASED_OUTPATIENT_CLINIC_OR_DEPARTMENT_OTHER): Payer: Self-pay | Admitting: *Deleted

## 2013-08-03 ENCOUNTER — Ambulatory Visit
Admission: RE | Admit: 2013-08-03 | Discharge: 2013-08-03 | Disposition: A | Discharge status: Home / Self Care | Payer: 59 | Source: Ambulatory Visit | Attending: Surgery | Admitting: Surgery

## 2013-08-03 DIAGNOSIS — D0511 Intraductal carcinoma in situ of right breast: Secondary | ICD-10-CM

## 2013-08-03 HISTORY — PX: BREAST LUMPECTOMY: SHX2

## 2013-08-03 NOTE — Progress Notes (Signed)
To go to [facility] for labs-

## 2013-08-03 NOTE — Progress Notes (Signed)
To bring cpap and overnight bag in case she has to stay

## 2013-08-04 ENCOUNTER — Encounter (HOSPITAL_COMMUNITY)
Admission: RE | Admit: 2013-08-04 | Discharge: 2013-08-04 | Disposition: A | Discharge status: Home / Self Care | Payer: 59 | Source: Ambulatory Visit | Attending: Surgery | Admitting: Surgery

## 2013-08-04 DIAGNOSIS — D0511 Intraductal carcinoma in situ of right breast: Secondary | ICD-10-CM

## 2013-08-04 DIAGNOSIS — Z01812 Encounter for preprocedural laboratory examination: Secondary | ICD-10-CM | POA: Insufficient documentation

## 2013-08-04 LAB — CBC WITH DIFFERENTIAL/PLATELET
BASOS ABS: 0 10*3/uL (ref 0.0–0.1)
Basophils Relative: 0 % (ref 0–1)
Eosinophils Absolute: 0.1 10*3/uL (ref 0.0–0.7)
Eosinophils Relative: 1 % (ref 0–5)
HEMATOCRIT: 41.8 % (ref 36.0–46.0)
Hemoglobin: 14.2 g/dL (ref 12.0–15.0)
LYMPHS ABS: 1.8 10*3/uL (ref 0.7–4.0)
Lymphocytes Relative: 29 % (ref 12–46)
MCH: 28.6 pg (ref 26.0–34.0)
MCHC: 34 g/dL (ref 30.0–36.0)
MCV: 84.1 fL (ref 78.0–100.0)
MONO ABS: 0.3 10*3/uL (ref 0.1–1.0)
Monocytes Relative: 5 % (ref 3–12)
NEUTROS ABS: 3.9 10*3/uL (ref 1.7–7.7)
Neutrophils Relative %: 65 % (ref 43–77)
PLATELETS: 246 10*3/uL (ref 150–400)
RBC: 4.97 MIL/uL (ref 3.87–5.11)
RDW: 13 % (ref 11.5–15.5)
WBC: 6 10*3/uL (ref 4.0–10.5)

## 2013-08-04 LAB — COMPREHENSIVE METABOLIC PANEL
ALT: 12 U/L (ref 0–35)
AST: 15 U/L (ref 0–37)
Albumin: 4 g/dL (ref 3.5–5.2)
Alkaline Phosphatase: 106 U/L (ref 39–117)
Anion gap: 13 (ref 5–15)
BILIRUBIN TOTAL: 0.2 mg/dL — AB (ref 0.3–1.2)
BUN: 20 mg/dL (ref 6–23)
CHLORIDE: 103 meq/L (ref 96–112)
CO2: 23 meq/L (ref 19–32)
Calcium: 10 mg/dL (ref 8.4–10.5)
Creatinine, Ser: 0.68 mg/dL (ref 0.50–1.10)
GFR calc Af Amer: >90 mL/min (ref >90)
Glucose, Bld: 76 mg/dL (ref 70–99)
Potassium: 4.4 mEq/L (ref 3.7–5.3)
SODIUM: 139 meq/L (ref 137–147)
Total Protein: 7.4 g/dL (ref 6.0–8.3)

## 2013-08-08 ENCOUNTER — Encounter (HOSPITAL_BASED_OUTPATIENT_CLINIC_OR_DEPARTMENT_OTHER)
Admission: RE | Disposition: A | Discharge status: Home / Self Care | Payer: Self-pay | Source: Ambulatory Visit | Attending: Surgery

## 2013-08-08 ENCOUNTER — Encounter (HOSPITAL_BASED_OUTPATIENT_CLINIC_OR_DEPARTMENT_OTHER): Payer: Self-pay | Admitting: Anesthesiology

## 2013-08-08 ENCOUNTER — Ambulatory Visit (HOSPITAL_BASED_OUTPATIENT_CLINIC_OR_DEPARTMENT_OTHER)
Admission: RE | Admit: 2013-08-08 | Discharge: 2013-08-08 | Disposition: A | Discharge status: Home / Self Care | Payer: 59 | Source: Ambulatory Visit | Attending: Surgery | Admitting: Surgery

## 2013-08-08 ENCOUNTER — Ambulatory Visit (HOSPITAL_BASED_OUTPATIENT_CLINIC_OR_DEPARTMENT_OTHER): Payer: 59 | Admitting: Anesthesiology

## 2013-08-08 ENCOUNTER — Encounter (HOSPITAL_COMMUNITY)
Admission: RE | Admit: 2013-08-08 | Discharge: 2013-08-08 | Disposition: A | Discharge status: Home / Self Care | Payer: 59 | Source: Ambulatory Visit | Attending: Surgery | Admitting: Surgery

## 2013-08-08 ENCOUNTER — Ambulatory Visit
Admission: RE | Admit: 2013-08-08 | Discharge: 2013-08-08 | Disposition: A | Discharge status: Home / Self Care | Payer: 59 | Source: Ambulatory Visit | Attending: Surgery | Admitting: Surgery

## 2013-08-08 ENCOUNTER — Encounter (HOSPITAL_BASED_OUTPATIENT_CLINIC_OR_DEPARTMENT_OTHER): Payer: 59 | Admitting: Anesthesiology

## 2013-08-08 DIAGNOSIS — D0590 Unspecified type of carcinoma in situ of unspecified breast: Secondary | ICD-10-CM | POA: Insufficient documentation

## 2013-08-08 DIAGNOSIS — E039 Hypothyroidism, unspecified: Secondary | ICD-10-CM | POA: Insufficient documentation

## 2013-08-08 DIAGNOSIS — G4730 Sleep apnea, unspecified: Secondary | ICD-10-CM | POA: Insufficient documentation

## 2013-08-08 DIAGNOSIS — D649 Anemia, unspecified: Secondary | ICD-10-CM | POA: Insufficient documentation

## 2013-08-08 DIAGNOSIS — D0511 Intraductal carcinoma in situ of right breast: Secondary | ICD-10-CM

## 2013-08-08 DIAGNOSIS — Z79899 Other long term (current) drug therapy: Secondary | ICD-10-CM | POA: Insufficient documentation

## 2013-08-08 HISTORY — DX: Sleep apnea, unspecified: G47.30

## 2013-08-08 HISTORY — DX: Encounter for fitting and adjustment of spectacles and contact lenses: Z46.0

## 2013-08-08 SURGERY — RADIOACTIVE SEED GUIDED PARTIAL MASTECTOMY WITH AXILLARY SENTINEL LYMPH NODE BIOPSY
Anesthesia: General | Site: Breast | Laterality: Right

## 2013-08-08 MED ORDER — OXYCODONE-ACETAMINOPHEN 5-325 MG PO TABS: 1.0000 | ORAL_TABLET | ORAL | Status: DC | PRN

## 2013-08-08 MED ORDER — CEFAZOLIN SODIUM-DEXTROSE 2-3 GM-% IV SOLR
INTRAVENOUS | Status: AC
  Filled 2013-08-08: qty 50

## 2013-08-08 MED ORDER — HYDROMORPHONE HCL PF 1 MG/ML IJ SOLN
0.2500 mg | INTRAMUSCULAR | Status: DC | PRN
  Administered 2013-08-08 (×2): 0.25 mg via INTRAVENOUS
  Administered 2013-08-08: 0.5 mg via INTRAVENOUS

## 2013-08-08 MED ORDER — MIDAZOLAM HCL 2 MG/2ML IJ SOLN
INTRAMUSCULAR | Status: AC
  Filled 2013-08-08: qty 2

## 2013-08-08 MED ORDER — DEXAMETHASONE SODIUM PHOSPHATE 4 MG/ML IJ SOLN
INTRAMUSCULAR | Status: DC | PRN
  Administered 2013-08-08: 10 mg via INTRAVENOUS

## 2013-08-08 MED ORDER — LIDOCAINE HCL (CARDIAC) 20 MG/ML IV SOLN
INTRAVENOUS | Status: DC | PRN
  Administered 2013-08-08: 30 mg via INTRAVENOUS

## 2013-08-08 MED ORDER — METHYLENE BLUE 1 % INJ SOLN
INTRAMUSCULAR | Status: AC
Start: 2013-08-08 — End: 2013-08-08
  Filled 2013-08-08: qty 10

## 2013-08-08 MED ORDER — SODIUM CHLORIDE 0.9 % IJ SOLN
INTRAMUSCULAR | Status: AC
  Filled 2013-08-08: qty 10

## 2013-08-08 MED ORDER — CEFAZOLIN SODIUM-DEXTROSE 2-3 GM-% IV SOLR
2.0000 g | INTRAVENOUS | Status: AC
  Administered 2013-08-08: 2 g via INTRAVENOUS

## 2013-08-08 MED ORDER — ONDANSETRON HCL 4 MG/2ML IJ SOLN
INTRAMUSCULAR | Status: DC | PRN
  Administered 2013-08-08: 4 mg via INTRAVENOUS

## 2013-08-08 MED ORDER — FENTANYL CITRATE 0.05 MG/ML IJ SOLN
INTRAMUSCULAR | Status: AC
  Filled 2013-08-08: qty 2

## 2013-08-08 MED ORDER — FENTANYL CITRATE 0.05 MG/ML IJ SOLN
50.0000 ug | INTRAMUSCULAR | Status: DC | PRN
  Administered 2013-08-08 (×2): 50 ug via INTRAVENOUS

## 2013-08-08 MED ORDER — LACTATED RINGERS IV SOLN
INTRAVENOUS | Status: DC
  Administered 2013-08-08 (×2): via INTRAVENOUS

## 2013-08-08 MED ORDER — BUPIVACAINE-EPINEPHRINE (PF) 0.25% -1:200000 IJ SOLN
INTRAMUSCULAR | Status: AC
  Filled 2013-08-08: qty 30

## 2013-08-08 MED ORDER — METOCLOPRAMIDE HCL 5 MG/ML IJ SOLN: 10.0000 mg | Freq: Once | INTRAMUSCULAR | Status: DC | PRN

## 2013-08-08 MED ORDER — FENTANYL CITRATE 0.05 MG/ML IJ SOLN
INTRAMUSCULAR | Status: AC
  Filled 2013-08-08: qty 4

## 2013-08-08 MED ORDER — MIDAZOLAM HCL 5 MG/5ML IJ SOLN
INTRAMUSCULAR | Status: DC | PRN
  Administered 2013-08-08 (×2): 1 mg via INTRAVENOUS

## 2013-08-08 MED ORDER — BUPIVACAINE HCL (PF) 0.5 % IJ SOLN
INTRAMUSCULAR | Status: DC | PRN
  Administered 2013-08-08: 20 mL

## 2013-08-08 MED ORDER — FENTANYL CITRATE 0.05 MG/ML IJ SOLN
INTRAMUSCULAR | Status: DC | PRN
  Administered 2013-08-08: 50 ug via INTRAVENOUS
  Administered 2013-08-08: 25 ug via INTRAVENOUS
  Administered 2013-08-08: 50 ug via INTRAVENOUS
  Administered 2013-08-08: 25 ug via INTRAVENOUS
  Administered 2013-08-08: 50 ug via INTRAVENOUS

## 2013-08-08 MED ORDER — PROPOFOL 10 MG/ML IV BOLUS
INTRAVENOUS | Status: DC | PRN
  Administered 2013-08-08: 200 mg via INTRAVENOUS

## 2013-08-08 MED ORDER — CHLORHEXIDINE GLUCONATE 4 % EX LIQD: 1.0000 "application " | Freq: Once | CUTANEOUS | Status: DC

## 2013-08-08 MED ORDER — HYDROMORPHONE HCL PF 1 MG/ML IJ SOLN
INTRAMUSCULAR | Status: AC
  Filled 2013-08-08: qty 1

## 2013-08-08 MED ORDER — TECHNETIUM TC 99M SULFUR COLLOID FILTERED
1.0000 | Freq: Once | INTRAVENOUS | Status: AC | PRN
Start: 2013-08-08 — End: 2013-08-08
  Administered 2013-08-08: 1 via INTRADERMAL

## 2013-08-08 MED ORDER — OXYCODONE HCL 5 MG PO TABS: 5.0000 mg | ORAL_TABLET | Freq: Once | ORAL | Status: DC | PRN

## 2013-08-08 MED ORDER — PROPOFOL 10 MG/ML IV BOLUS
INTRAVENOUS | Status: AC
  Filled 2013-08-08: qty 20

## 2013-08-08 MED ORDER — OXYCODONE HCL 5 MG/5ML PO SOLN: 5.0000 mg | Freq: Once | ORAL | Status: DC | PRN

## 2013-08-08 MED ORDER — MIDAZOLAM HCL 2 MG/2ML IJ SOLN
1.0000 mg | INTRAMUSCULAR | Status: DC | PRN
  Administered 2013-08-08: 2 mg via INTRAVENOUS

## 2013-08-08 SURGICAL SUPPLY — 53 items
ADH SKN CLS APL DERMABOND .7 (GAUZE/BANDAGES/DRESSINGS) ×1
APPLIER CLIP 9.375 MED OPEN (MISCELLANEOUS) ×2
BINDER BREAST LRG (GAUZE/BANDAGES/DRESSINGS) IMPLANT
BINDER BREAST MEDIUM (GAUZE/BANDAGES/DRESSINGS) IMPLANT
BINDER BREAST XLRG (GAUZE/BANDAGES/DRESSINGS) IMPLANT
BINDER BREAST XXLRG (GAUZE/BANDAGES/DRESSINGS) ×2 IMPLANT
BLADE SURG 15 STRL LF DISP TIS (BLADE) ×2 IMPLANT
BLADE SURG 15 STRL SS (BLADE) ×2
CANISTER SUC SOCK COL 7IN (MISCELLANEOUS) IMPLANT
CANISTER SUCT 1200ML W/VALVE (MISCELLANEOUS) ×2 IMPLANT
CHLORAPREP W/TINT 26ML (MISCELLANEOUS) ×2 IMPLANT
CLIP APPLIE 9.375 MED OPEN (MISCELLANEOUS) ×1 IMPLANT
CLIP TI WIDE RED SMALL 6 (CLIP) ×2 IMPLANT
COVER MAYO STAND STRL (DRAPES) ×2 IMPLANT
COVER PROBE W GEL 5X96 (DRAPES) ×2 IMPLANT
COVER TABLE BACK 60X90 (DRAPES) ×2 IMPLANT
DECANTER SPIKE VIAL GLASS SM (MISCELLANEOUS) IMPLANT
DERMABOND ADVANCED (GAUZE/BANDAGES/DRESSINGS) ×1
DERMABOND ADVANCED .7 DNX12 (GAUZE/BANDAGES/DRESSINGS) ×1 IMPLANT
DEVICE DUBIN W/COMP PLATE 8390 (MISCELLANEOUS) ×2 IMPLANT
DRAPE LAPAROSCOPIC ABDOMINAL (DRAPES) ×2 IMPLANT
DRAPE UTILITY XL STRL (DRAPES) ×2 IMPLANT
ELECT COATED BLADE 2.86 ST (ELECTRODE) ×2 IMPLANT
ELECT REM PT RETURN 9FT ADLT (ELECTROSURGICAL) ×2
ELECTRODE REM PT RTRN 9FT ADLT (ELECTROSURGICAL) ×1 IMPLANT
GLOVE BIO SURGEON STRL SZ 6.5 (GLOVE) ×4 IMPLANT
GLOVE BIOGEL PI IND STRL 7.0 (GLOVE) ×2 IMPLANT
GLOVE BIOGEL PI IND STRL 8 (GLOVE) ×1 IMPLANT
GLOVE BIOGEL PI INDICATOR 7.0 (GLOVE) ×2
GLOVE BIOGEL PI INDICATOR 8 (GLOVE) ×1
GLOVE ECLIPSE 8.0 STRL XLNG CF (GLOVE) ×4 IMPLANT
GOWN STRL REUS W/ TWL LRG LVL3 (GOWN DISPOSABLE) ×3 IMPLANT
GOWN STRL REUS W/TWL LRG LVL3 (GOWN DISPOSABLE) ×3
HEMOSTAT SNOW SURGICEL 2X4 (HEMOSTASIS) ×2 IMPLANT
KIT MARKER MARGIN INK (KITS) ×2 IMPLANT
NDL SAFETY ECLIPSE 18X1.5 (NEEDLE) IMPLANT
NEEDLE HYPO 18GX1.5 SHARP (NEEDLE)
NEEDLE HYPO 25X1 1.5 SAFETY (NEEDLE) ×2 IMPLANT
NS IRRIG 1000ML POUR BTL (IV SOLUTION) ×2 IMPLANT
PACK BASIN DAY SURGERY FS (CUSTOM PROCEDURE TRAY) ×2 IMPLANT
PENCIL BUTTON HOLSTER BLD 10FT (ELECTRODE) ×2 IMPLANT
SLEEVE SCD COMPRESS KNEE MED (MISCELLANEOUS) ×2 IMPLANT
SPONGE LAP 4X18 X RAY DECT (DISPOSABLE) ×4 IMPLANT
STAPLER VISISTAT 35W (STAPLE) IMPLANT
SUT MNCRL AB 4-0 PS2 18 (SUTURE) ×2 IMPLANT
SUT SILK 2 0 SH (SUTURE) IMPLANT
SUT VIC AB 3-0 SH 27 (SUTURE) ×2
SUT VIC AB 3-0 SH 27X BRD (SUTURE) ×2 IMPLANT
SYRINGE CONTROL L 12CC (SYRINGE) ×2 IMPLANT
TOWEL OR 17X24 6PK STRL BLUE (TOWEL DISPOSABLE) ×2 IMPLANT
TOWEL OR NON WOVEN STRL DISP B (DISPOSABLE) ×2 IMPLANT
TUBE CONNECTING 20X1/4 (TUBING) ×2 IMPLANT
YANKAUER SUCT BULB TIP NO VENT (SUCTIONS) ×2 IMPLANT

## 2013-08-08 NOTE — Op Note (Signed)
Preoperative diagnosis: RIGHT breast DCIS 3 CM upper outer quadrant  Postoperative diagnosis: Same  Procedure: right  Partial lumpectomy with seed localization and sentinel lymph node mapping  Surgeon: [name] M.D.  Anesthesia: LMA with 0.25% Sensorcaine with epinephrine  EBL: minimal   IV fluids: 600 cc  Specimens:right  Breast tissue and axillary lymph node plus 6 margins  Drains:none  Indications for procedure: Pt diagnosed  with right breast DCIS Upper outer quadrant.  Discussed breast conservation vs mastectomy and reconstruction.  Pt opted for lumpectomy and SLN mapping due to size and location of DCIS. The procedure has been discussed with the patient. Alternatives to surgery have been discussed with the patient.  Risks of surgery include bleeding,  Infection,  Seroma formation, death,  and the need for further surgery.   The patient understands and wishes to proceed.Sentinel lymph node mapping and dissection has been discussed with the patient.  Risk of bleeding,  Infection,  Seroma formation,  Additional procedures,,  Shoulder weakness ,  Shoulder stiffness,  Nerve and blood vessel injury and reaction to the mapping dyes have been discussed.  Alternatives to surgery have been discussed with the patient.  The patient agrees to proceed.  Description of procedure: The patient was seen in the holding area and identified. Questions are answered and the side was marked as the correct surgical site. Patient underwent seed  localization and injection of technetium sulfur colloid by radiology preoperatively. She was taken back to the operating room and placed supine on the operating room table. After induction of general anesthesia,  Timeout was done and she received preoperative antibiotics. Curvilinear incision was made in the right upper quadrant with the neoprobe as a guide the breast. All tissue around the seed was excised and margins were grossly negative. All margins reexcised and  oriented.  The cavity was irrigated and found to be hemostatic. Lymph node was done next through same incision.  Neoprobe was used and the appropriate hot spot was identified. All nodal basins were examined the neoprobe background counts were taken. 3 cm incision was made in the axilla and dissection was carried down. Hot and were identified times 3 . These were excised. Hemostasis achieved with small clips and electrocautery. These were sent for permanent section. Cavity is irrigated and found to be hemostatic. Wound was closed with 3-0 Vicryl and 4-0 Monocryl. Dermabond applied. All sponge needle and she is found to be correct this portion the case. Patient awoke extubated taken recovery in satisfactory condition.

## 2013-08-08 NOTE — Progress Notes (Signed)
Assisted Dr. Ola Spurr with right, ultrasound guided, pectoralis block. Side rails up, monitors on throughout procedure. See vital signs in flow sheet. Tolerated Procedure well.

## 2013-08-08 NOTE — Anesthesia Procedure Notes (Addendum)
Anesthesia Regional Block:  Pectoralis block  Pre-Anesthetic Checklist: ,, timeout performed, Correct Patient, Correct Site, Correct Laterality, Correct Procedure, Correct Position, site marked, Risks and benefits discussed,  Surgical consent,  Pre-op evaluation,  At surgeon's request and post-op pain management  Laterality: Right  Prep: chloraprep       Needles:  Injection technique: Single-shot  Needle Type: Echogenic Needle     Needle Length: 9cm 9 cm Needle Gauge: 21 and 21 G    Additional Needles:  Procedures: ultrasound guided (picture in chart) Pectoralis block Narrative:  Start time: 08/08/2013 7:08 AM End time: 08/08/2013 7:16 AM Injection made incrementally with aspirations every 5 mL.  Performed by: Personally  Anesthesiologist: Nada Libman, MD  Additional Notes: Ultrasound guidance used to: id relevant anatomy, confirm needle position, local anesthetic spread, avoidance of vascular puncture. Picture saved. No complications. Block performed personally by Jessy Oto. Albertina Parr, MD     Procedure Name: LMA Insertion Date/Time: 08/08/2013 7:45 AM Performed by: Maryella Shivers Pre-anesthesia Checklist: Patient identified, Emergency Drugs available, Suction available and Patient being monitored Patient Re-evaluated:Patient Re-evaluated prior to inductionOxygen Delivery Method: Circle System Utilized Preoxygenation: Pre-oxygenation with 100% oxygen Intubation Type: IV induction Ventilation: Mask ventilation without difficulty LMA: LMA inserted LMA Size: 4.0 Number of attempts: 1 Airway Equipment and Method: bite block Placement Confirmation: positive ETCO2 Tube secured with: Tape Dental Injury: Teeth and Oropharynx as per pre-operative assessment

## 2013-08-08 NOTE — Transfer of Care (Signed)
Immediate Anesthesia Transfer of Care Note  Patient: Jennifer Frazier  Procedure(s) Performed: Procedure(s): RIGHT BREAST RADIOACTIVE SEED GUIDED LUMPECTOMY WITH AXILLARY SENTINEL MAPPING (Right)  Patient Location: PACU  Anesthesia Type:GA combined with regional for post-op pain  Level of Consciousness: sedated  Airway & Oxygen Therapy: Patient Spontanous Breathing and Patient connected to face mask oxygen  Post-op Assessment: Report given to PACU RN and Post -op Vital signs reviewed and stable  Post vital signs: Reviewed and stable  Complications: No apparent anesthesia complications

## 2013-08-08 NOTE — Progress Notes (Signed)
Radiology staff present for nuc med inj. Additional fentanyl given for comfort. Pt tol procedure well. See doc flowsheets for VS. Family called to bedside... Updated and emot support provided

## 2013-08-08 NOTE — Discharge Instructions (Signed)
[facility] Office Phone Number [phone]  BREAST BIOPSY/ Lumpctomy: POST OP INSTRUCTIONS  Always review your discharge instruction sheet given to you by the facility where your surgery was performed.  IF YOU HAVE DISABILITY OR FAMILY LEAVE FORMS, YOU MUST BRING THEM TO THE OFFICE FOR PROCESSING.  DO NOT GIVE THEM TO YOUR DOCTOR.  1. A prescription for pain medication may be given to you upon discharge.  Take your pain medication as prescribed, if needed.  If narcotic pain medicine is not needed, then you may take acetaminophen (Tylenol) or ibuprofen (Advil) as needed. 2. Take your usually prescribed medications unless otherwise directed 3. If you need a refill on your pain medication, please contact your pharmacy.  They will contact our office to request authorization.  Prescriptions will not be filled after 5pm or on week-ends. 4. You should eat very light the first 24 hours after surgery, such as soup, crackers, pudding, etc.  Resume your normal diet the day after surgery. 5. Most patients will experience some swelling and bruising in the breast.  Ice packs and a good support bra will help.  Swelling and bruising can take several days to resolve.  6. It is common to experience some constipation if taking pain medication after surgery.  Increasing fluid intake and taking a stool softener will usually help or prevent this problem from occurring.  A mild laxative (Milk of Magnesia or Miralax) should be taken according to package directions if there are no bowel movements after 48 hours. 7. Unless discharge instructions indicate otherwise, you may remove your bandages 24-48 hours after surgery, and you may shower at that time.  You may have steri-strips (small skin tapes) in place directly over the incision.  These strips should be left on the skin for 7-10 days.  If your surgeon used skin glue on the incision, you may shower in 24 hours.  The glue will flake off over the next 2-3  weeks.  Any sutures or staples will be removed at the office during your follow-up visit. 8. ACTIVITIES:  You may resume regular daily activities (gradually increasing) beginning the next day.  Wearing a good support bra or sports bra minimizes pain and swelling.  You may have sexual intercourse when it is comfortable. a. You may drive when you no longer are taking prescription pain medication, you can comfortably wear a seatbelt, and you can safely maneuver your car and apply brakes. b. RETURN TO WORK:  ______________________________________________________________________________________ 9. You should see your doctor in the office for a follow-up appointment approximately two weeks after your surgery.  Your doctors nurse will typically make your follow-up appointment when she calls you with your pathology report.  Expect your pathology report 2-3 business days after your surgery.  You may call to check if you do not hear from [name] after three days. 10. OTHER INSTRUCTIONS: _______________________________________________________________________________________________ _____________________________________________________________________________________________________________________________________ _____________________________________________________________________________________________________________________________________ _____________________________________________________________________________________________________________________________________  WHEN TO CALL YOUR DOCTOR: 1. Fever over 101.0 2. Nausea and/or vomiting. 3. Extreme swelling or bruising. 4. Continued bleeding from incision. 5. Increased pain, redness, or drainage from the incision.  The clinic staff is available to answer your questions during regular business hours.  Please dont hesitate to call and ask to speak to one of the nurses for clinical concerns.  If you have a medical emergency, go to the nearest emergency  room or call 911.  A surgeon from [facility] is always on call at the hospital.  For further questions, please visit [email or web address]  Post Anesthesia Home Care Instructions ° °Activity: °Get plenty of rest for the remainder of the day. A responsible adult should stay with you for 24 hours following the procedure.  °For the next 24 hours, DO NOT: °-Drive a car °-Operate machinery °-Drink alcoholic beverages °-Take any medication unless instructed by your physician °-Make any legal decisions or sign important papers. ° °Meals: °Start with liquid foods such as gelatin or soup. Progress to regular foods as tolerated. Avoid greasy, spicy, heavy foods. If nausea and/or vomiting occur, drink only clear liquids until the nausea and/or vomiting subsides. Call your physician if vomiting continues. ° °Special Instructions/Symptoms: °Your throat may feel dry or sore from the anesthesia or the breathing tube placed in your throat during surgery. If this causes discomfort, gargle with warm salt water. The discomfort should disappear within 24 hours. ° °

## 2013-08-08 NOTE — Interval H&P Note (Signed)
History and Physical Interval Note:  08/08/2013 7:09 AM  Jennifer Frazier  has presented today for surgery, with the diagnosis of right dcis  The various methods of treatment have been discussed with the patient and family. After consideration of risks, benefits and other options for treatment, the patient has consented to  Procedure(s): RIGHT BREAST RADIOACTIVE SEED GUIUIDED LUMPECTOMY WITH AXILLARY SENTINEL MAPPING (Right) as a surgical intervention .  The patient's history has been reviewed, patient examined, no change in status, stable for surgery.  I have reviewed the patient's chart and labs.  Questions were answered to the patient's satisfaction.     Nihar Klus A.

## 2013-08-08 NOTE — H&P (View-Only) (Signed)
Patient ID: Jennifer Frazier, female   DOB: [birth year], 38 y.o.   MRN: [id number]  Chief Complaint  Patient presents with  . Mass    rt breast    HPI Jennifer Frazier is a 38 y.o. female.   HPI Patient sent at the request of Dr. Lovey Newcomer of the [facility] for right breast microcalcifications. This is picked up on her second mammogram. The area measures 1 cm in maximal diameter in the upper-outer quadrant. These were felt to be suspicious were biopsied. Results showed DCIS with comedonecrosis high grade. Patient denies any history of breast pain, nipple discharge or breast mass. Patient has first degree and with breast cancer. She is to grandmothers with ovarian cancer.MRI set up for Tuesday.  Past Medical History  Diagnosis Date  . Fibroids     uterine fibroids  . Anemia   . Thyroid disease     Past Surgical History  Procedure Laterality Date  . [place]  2012  . Eye surgery  2013    Family History  Problem Relation Age of Onset  . Cancer Father     colon  . Cancer Maternal Aunt     breast cancer  . Cancer Paternal Aunt     breast cancer  . Cancer Maternal Grandmother     ovarian cancer  . Cancer Paternal Grandmother     ovarian cancer    Social History History  Substance Use Topics  . Smoking status: Never Smoker   . Smokeless tobacco: Not on file  . Alcohol Use: No    Allergies  Allergen Reactions  . Gadolinium      Desc: NAUSEA WITH MRI CONTRAST-NO VOMITING   . Tramadol Hives and Nausea Only    Current Outpatient Prescriptions  Medication Sig Dispense Refill  . Barberry-Oreg Grape-Goldenseal (BERBERINE COMPLEX PO) Take 500 mg by mouth 3 (three) times daily.      . Cholecalciferol (VITAMIN D3) 3000 UNITS TABS Take 10,000 Units by mouth daily.      . magnesium gluconate (MAGONATE) 500 MG tablet Take 400 mg by mouth Nightly.      . progesterone 200 MG SUPP Place 200 mg vaginally at bedtime.      . promethazine (PHENERGAN) 25 MG tablet Take 25 mg by  mouth every 6 (six) hours as needed for nausea or vomiting.      . thyroid (ARMOUR) 130 MG tablet Take 130 mg by mouth daily.      . vitamin B-12 (CYANOCOBALAMIN) 1000 MCG tablet Take 5,000 mcg by mouth daily.       No current facility-administered medications for this visit.    Review of Systems Review of Systems  Constitutional: Negative for fever, chills and unexpected weight change.  HENT: Negative for congestion, hearing loss, sore throat, trouble swallowing and voice change.   Eyes: Negative for visual disturbance.  Respiratory: Negative for cough and wheezing.   Cardiovascular: Negative for chest pain, palpitations and leg swelling.  Gastrointestinal: Negative for nausea, vomiting, abdominal pain, diarrhea, constipation, blood in stool, abdominal distention and anal bleeding.  Genitourinary: Negative for hematuria, vaginal bleeding and difficulty urinating.  Musculoskeletal: Negative for arthralgias.  Skin: Negative for rash and wound.  Neurological: Negative for seizures, syncope and headaches.  Hematological: Negative for adenopathy. Does not bruise/bleed easily.  Psychiatric/Behavioral: Negative for confusion.    Blood pressure 118/85, pulse 77, temperature 97.8 F (36.6 C), resp. rate 16, height 5\' 9"  (1.753 m), weight 219 lb (99.338 kg).  Physical  Exam Physical Exam  Constitutional: She is oriented to person, place, and time. She appears well-developed and well-nourished.  HENT:  Head: Normocephalic and atraumatic.  Eyes: Pupils are equal, round, and reactive to light. No scleral icterus.  Neck: Normal range of motion. Neck supple.  Cardiovascular: Normal rate and regular rhythm.   Pulmonary/Chest: Effort normal and breath sounds normal. Right breast exhibits no inverted nipple, no mass, no nipple discharge, no skin change and no tenderness. Left breast exhibits no inverted nipple, no mass, no nipple discharge, no skin change and no tenderness. Breasts are symmetrical.      Musculoskeletal: Normal range of motion.  Lymphadenopathy:    She has no cervical adenopathy.  Neurological: She is alert and oriented to person, place, and time.  Skin: Skin is warm and dry.  Psychiatric: She has a normal mood and affect. Her behavior is normal. Judgment and thought content normal.    Data Reviewed CLINICAL DATA: Patient recalled from screening for right breast  calcifications.  EXAM:  DIGITAL DIAGNOSTIC RIGHT MAMMOGRAM  COMPARISON: [facility]  ACR Breast Density Category c: The breast tissue is heterogeneously  dense, which may obscure small masses.  FINDINGS:  Within the upper-outer right breast posterior depth there is a 12 x  5 x 4 mm group of a amorphous calcifications, further evaluated with  spot compression magnification views.  IMPRESSION:  Suspicious right breast calcifications.  RECOMMENDATION:  Stereotactic guided core needle biopsy.  Biopsy scheduled for 07/17/2013 at 3 p.m.  I have discussed the findings and recommendations with the patient.  Results were also provided in writing at the conclusion of the  visit. If applicable, a reminder letter will be sent to the patient  regarding the next appointment.  BI-RADS CATEGORY 4: Suspicious.  Electronically Signed  By: Lovey Newcomer M.D.  On: 07/06/2013 15:32    Assessment    Right breast DCIS with comedonecrosis high-grade upper outer quadrant  Family history of breast cancer    Plan    Discussed breast conservation versus mastectomy and reconstruction. I feel she is a good lumpectomy candidate. MRI is for tomorrow. As long as there are no new findings on her MRI, she would like to proceed with breast conservation. We'll set up for right breast lumpectomy seed localized and sentinel lymph node mapping given the location and high-grade features.The procedure has been discussed with the patient. Alternatives to surgery have been discussed with the patient.  Risks of surgery include bleeding,   Infection,  Seroma formation, death,  and the need for further surgery.   The patient understands and wishes to proceed.Sentinel lymph node mapping and dissection has been discussed with the patient.  Risk of bleeding,  Infection,  Seroma formation,  Additional procedures,,  Shoulder weakness ,  Shoulder stiffness,  Nerve and blood vessel injury and reaction to the mapping dyes have been discussed.  Alternatives to surgery have been discussed with the patient.  The patient agrees to proceed.   Needs genetics.     CORNETT,THOMAS A. 07/24/2013, 12:23 PM

## 2013-08-08 NOTE — Anesthesia Postprocedure Evaluation (Signed)
Anesthesia Post Note  Patient: Jennifer Frazier  Procedure(s) Performed: Procedure(s) (LRB): RIGHT BREAST RADIOACTIVE SEED GUIDED LUMPECTOMY WITH AXILLARY SENTINEL MAPPING (Right)  Anesthesia type: General  Patient location: PACU  Post pain: Pain level controlled  Post assessment: Patient's Cardiovascular Status Stable  Last Vitals:  Filed Vitals:   08/08/13 0945  BP: 147/79  Pulse: 74  Temp:   Resp: 15    Post vital signs: Reviewed and stable  Level of consciousness: alert  Complications: No apparent anesthesia complications

## 2013-08-08 NOTE — Anesthesia Preprocedure Evaluation (Signed)
Anesthesia Evaluation  Patient identified by MRN, date of birth, ID band Patient awake    Reviewed: Allergy & Precautions, H&P , NPO status , Patient's Chart, lab work & pertinent test results, reviewed documented beta blocker date and time   Airway Mallampati: II TM Distance: >3 FB Neck ROM: full    Dental   Pulmonary neg pulmonary ROS, sleep apnea and Continuous Positive Airway Pressure Ventilation ,  breath sounds clear to auscultation        Cardiovascular negative cardio ROS  Rhythm:regular     Neuro/Psych negative neurological ROS  negative psych ROS   GI/Hepatic negative GI ROS, Neg liver ROS,   Endo/Other  Hypothyroidism   Renal/GU negative Renal ROS  negative genitourinary   Musculoskeletal   Abdominal   Peds  Hematology  (+) anemia ,   Anesthesia Other Findings See surgeon's H&P   Reproductive/Obstetrics negative OB ROS                           Anesthesia Physical Anesthesia Plan  ASA: III  Anesthesia Plan: General   Post-op Pain Management:    Induction: Intravenous  Airway Management Planned: LMA  Additional Equipment:   Intra-op Plan:   Post-operative Plan:   Informed Consent: I have reviewed the patients History and Physical, chart, labs and discussed the procedure including the risks, benefits and alternatives for the proposed anesthesia with the patient or authorized representative who has indicated his/her understanding and acceptance.   Dental Advisory Given  Plan Discussed with: CRNA and Surgeon  Anesthesia Plan Comments:         Anesthesia Quick Evaluation

## 2013-08-10 ENCOUNTER — Telehealth (INDEPENDENT_AMBULATORY_CARE_PROVIDER_SITE_OTHER): Payer: Self-pay

## 2013-08-10 ENCOUNTER — Other Ambulatory Visit (INDEPENDENT_AMBULATORY_CARE_PROVIDER_SITE_OTHER): Payer: Self-pay | Admitting: Surgery

## 2013-08-10 DIAGNOSIS — D0511 Intraductal carcinoma in situ of right breast: Secondary | ICD-10-CM

## 2013-08-10 NOTE — Telephone Encounter (Signed)
LMOM> path shows neg margins and nodes. Will put referrals in. Cancer center will call with appts.

## 2013-08-10 NOTE — Telephone Encounter (Signed)
Message copied by [name] Coria on Thu Aug 10, 2013  3:22 PM ------      Message from: [name] A      Created: Wed Aug 09, 2013  5:16 PM       Margins negative node negative.  Needs referral to medical and rad onc ------

## 2013-08-11 ENCOUNTER — Telehealth (INDEPENDENT_AMBULATORY_CARE_PROVIDER_SITE_OTHER): Payer: Self-pay | Admitting: General Surgery

## 2013-08-11 ENCOUNTER — Telehealth: Payer: Self-pay | Admitting: *Deleted

## 2013-08-11 ENCOUNTER — Encounter: Payer: Self-pay | Admitting: *Deleted

## 2013-08-11 DIAGNOSIS — Z170 Estrogen receptor positive status [ER+]: Secondary | ICD-10-CM | POA: Insufficient documentation

## 2013-08-11 DIAGNOSIS — C50411 Malignant neoplasm of upper-outer quadrant of right female breast: Secondary | ICD-10-CM | POA: Insufficient documentation

## 2013-08-11 NOTE — Telephone Encounter (Signed)
Pt left message stating discharge from wound.  Attempted to call back but phone number unable to connect.

## 2013-08-11 NOTE — Telephone Encounter (Signed)
Pt called stating brown tinged drainage.  No erythema or fevers.  Reassured pt.  Will notify if develops signs of infection

## 2013-08-11 NOTE — Telephone Encounter (Signed)
Spoke with patient and confirmed new patient appointment for 08/24/13 at 1230pm for fin, lab and Dr. Jana Hakim. Instructions and contact information given to patient.  Mailed letter and packet.  Paperwork to medical records for chart.

## 2013-08-13 ENCOUNTER — Telehealth (INDEPENDENT_AMBULATORY_CARE_PROVIDER_SITE_OTHER): Payer: Self-pay | Admitting: General Surgery

## 2013-08-13 NOTE — Telephone Encounter (Signed)
Patient called stating wound edge was yellowish green in color.  Denied erythema or edema.  Having some brown discharge.  Recommended she have the wound evaluated in the office tomorrow.

## 2013-08-14 ENCOUNTER — Encounter (INDEPENDENT_AMBULATORY_CARE_PROVIDER_SITE_OTHER): Payer: 59 | Admitting: General Surgery

## 2013-08-14 ENCOUNTER — Encounter (INDEPENDENT_AMBULATORY_CARE_PROVIDER_SITE_OTHER): Payer: 59 | Admitting: Surgery

## 2013-08-14 NOTE — Telephone Encounter (Signed)
Calling pt to see if she would like to come in today to see Dr Brantley Stage. Pt states that she went to the urgent clinic. Pt states that they told her that wound looks good and no sign of infection. Pt states they has not been any swelling, redness or tenderness. Informed pt that if she notices any redness, swelling, drainage or tenderness. Pt verbalized understanding.

## 2013-08-15 ENCOUNTER — Encounter (INDEPENDENT_AMBULATORY_CARE_PROVIDER_SITE_OTHER): Payer: Self-pay | Admitting: General Surgery

## 2013-08-15 ENCOUNTER — Other Ambulatory Visit: Payer: Self-pay | Admitting: *Deleted

## 2013-08-15 ENCOUNTER — Encounter: Payer: Self-pay | Admitting: *Deleted

## 2013-08-15 ENCOUNTER — Ambulatory Visit (INDEPENDENT_AMBULATORY_CARE_PROVIDER_SITE_OTHER): Payer: 59 | Admitting: General Surgery

## 2013-08-15 ENCOUNTER — Other Ambulatory Visit (INDEPENDENT_AMBULATORY_CARE_PROVIDER_SITE_OTHER): Payer: Self-pay | Admitting: General Surgery

## 2013-08-15 VITALS — BP 118/74 | HR 80 | Temp 97.5°F | Ht 69.0 in | Wt 221.0 lb

## 2013-08-15 DIAGNOSIS — T792XXA Traumatic secondary and recurrent hemorrhage and seroma, initial encounter: Secondary | ICD-10-CM

## 2013-08-15 DIAGNOSIS — N611 Abscess of the breast and nipple: Secondary | ICD-10-CM

## 2013-08-15 DIAGNOSIS — T888XXA Other specified complications of surgical and medical care, not elsewhere classified, initial encounter: Principal | ICD-10-CM

## 2013-08-15 DIAGNOSIS — C50411 Malignant neoplasm of upper-outer quadrant of right female breast: Secondary | ICD-10-CM

## 2013-08-15 DIAGNOSIS — IMO0002 Reserved for concepts with insufficient information to code with codable children: Secondary | ICD-10-CM

## 2013-08-15 NOTE — Progress Notes (Signed)
Completed chart, labs entered, added to spreadsheet and placed in Dr. Magrinat's box.  

## 2013-08-15 NOTE — Patient Instructions (Signed)
Expect some blood tinged or straw colored drainage on gauze. Change gauze as needed for drainage  Seroma A seroma is a collection of fluid that looks like swelling or a mass on the body. Seromas form on the body where tissue has been injured or cut. They are most common after surgeries. Seromas vary in size. Some are small and painless. Others may become large and cause pain or discomfort. Many seromas go away on their own; the fluid is naturally absorbed by the body. Some may require the fluid to be drained through medical procedures.  CAUSES  Seromas form as the result of damage to tissue or the removal of tissue. This tissue damage may occur during surgery or because of an injury or trauma. When tissue is disrupted or removed, empty space is created. The body's natural defense system causes fluid to enter the empty space and form a seroma. SYMPTOMS   Swelling at the site of a surgical cut (incision) or an injury.  Drainage of clear fluid at the surgery or injury site.  Possible discomfort or pain. DIAGNOSIS  Your caregiver will perform a physical exam. During the exam, the caregiver will press on the seroma using a hand or fingers (palpation). Various tests may be ordered to help confirm the diagnosis. These tests may include:  Blood tests.  Imaging tests such as ultrasonography or computed tomography (CT). TREATMENT  Sometimes seromas resolve on their own and drain naturally in the body. Your caregiver may monitor you to make sure the seroma does not cause any complications. If your seroma does not resolve on its own, treatment may include:  Using a needle to drain the fluid from the seroma (needle aspiration).  Inserting a flexible tube (catheter) to drain the fluid.  Applying a dressing, such as an elastic bandage or binder.  Use of antibiotic medicines if the seroma becomes infected.  In rare cases, surgery may be done to remove the seroma and repair the area. HOME CARE  INSTRUCTIONS  Follow your caregiver's instructions regarding activity levels and any limitations on movements.  Only take over-the-counter or prescription medicines as directed by your caregiver.  If your caregiver prescribes antibiotics, take them as directed. Finish them even if you start to feel better.  Check your seroma every day for redness, warmth, or yellow drainage.  Follow up with your caregiver as directed. SEEK MEDICAL CARE IF:  You develop a fever.  You have pain, tenderness, redness, or warmth at the site of the seroma.  You notice yellow drainage coming from the site of the seroma.  Your seroma is getting bigger. Document Released: 05/09/2012 Document Reviewed: 05/09/2012 [facility] Patient Information 2015 [place]. This information is not intended to replace advice given to you by your health care provider. Make sure you discuss any questions you have with your health care provider.

## 2013-08-15 NOTE — Progress Notes (Signed)
Completed chart, labs entered, added to spreadsheet & placed in Dr. Magrinat's box.  

## 2013-08-15 NOTE — Progress Notes (Signed)
Subjective:     Patient ID: TAMIEKA RANCOURT, female   DOB: [birth year], 38 y.o.   MRN: [id number]  HPI 38 year old female comes in because of swelling and tightness around her right breast lumpectomy incision in the upper outer quadrant of her breast. She had surgery done on July 14 by Dr. Brantley Stage. She denies any fevers or chills. She went to an urgent clinic yesterday and was told there was no evidence of infection. However she developed worsening tightness and swelling the area.  Review of Systems     Objective:   Physical Exam BP 118/74  Pulse 80  Temp(Src) 97.5 F (36.4 C)  Ht 5\' 9"  (1.753 m)  Wt 221 lb (100.245 kg)  BMI 32.62 kg/m2  LMP 08/07/2013 Alert, no apparent distress Incision in the upper-outer quadrant of right breast. No cellulitis, induration or fluctuance. She has a small amount of serous drainage from the lateral aspect of the incision it feels a bit boggy consistent with a seroma    Assessment:     Status post right breast lumpectomy with sentinel lymph node biopsy Postoperative seroma     Plan:     After obtaining verbal consent, the area was prepped with chlor prep. 2 cc of 1% Xylocaine was infiltrated. 100 cc of serosanguineous fluid was drained. On the initial aspiration there was some yellowish cloudy fluid. Therefore I am sending the fluid off for analysis. Probably consistent with more like fat necrosis. Keep appointment with Dr. Brantley Stage. We discussed local wound care. The patient was given educational material.  Leighton Ruff. Redmond Pulling, MD, FACS General, Bariatric, & Minimally Invasive Surgery [facility], [place]

## 2013-08-15 NOTE — Telephone Encounter (Signed)
Spoke to patient regarding incision concerns.  Patient scheduled in urgent office today @ 3:45pm w/Dr. Redmond Pulling

## 2013-08-16 ENCOUNTER — Telehealth (INDEPENDENT_AMBULATORY_CARE_PROVIDER_SITE_OTHER): Payer: Self-pay | Admitting: General Surgery

## 2013-08-16 NOTE — Telephone Encounter (Signed)
Pt called for wound care advice.  Advised her to shower and let the warm water help loosen the tape (she is reluctant to pull it loose.)  If necessary, can trim tape with scissors and let the remainder come off later.  Cover with gauze for comfort.  Pt understands and will comply.

## 2013-08-18 ENCOUNTER — Other Ambulatory Visit (INDEPENDENT_AMBULATORY_CARE_PROVIDER_SITE_OTHER): Payer: Self-pay | Admitting: Surgery

## 2013-08-18 ENCOUNTER — Telehealth (INDEPENDENT_AMBULATORY_CARE_PROVIDER_SITE_OTHER): Payer: Self-pay

## 2013-08-18 LAB — WOUND CULTURE
GRAM STAIN: NONE SEEN
Gram Stain: NONE SEEN

## 2013-08-18 MED ORDER — CIPROFLOXACIN HCL 500 MG PO TABS: 500.0000 mg | ORAL_TABLET | Freq: Two times a day (BID) | ORAL | Status: DC

## 2013-08-18 NOTE — Telephone Encounter (Signed)
Pt notified of rx called in. Pt will call if drainage or wd changes or does not improve on antibiotic.

## 2013-08-18 NOTE — Telephone Encounter (Signed)
Message copied by Dois Davenport on Fri Aug 18, 2013  9:55 AM ------      Message from: Jennifer Frazier      Created: Fri Aug 18, 2013  9:43 AM       Please call and let her know that we are starting this antibiotic.       thx ------

## 2013-08-18 NOTE — Progress Notes (Signed)
Culture grew out pseudomonas from aspiration.  Sensitive to many drugs.  Place on cipro 500 mg po bid.  For 10 days.

## 2013-08-24 ENCOUNTER — Encounter: Payer: Self-pay | Admitting: *Deleted

## 2013-08-24 ENCOUNTER — Encounter: Payer: Self-pay | Admitting: Oncology

## 2013-08-24 ENCOUNTER — Ambulatory Visit: Payer: 59

## 2013-08-24 ENCOUNTER — Ambulatory Visit (HOSPITAL_BASED_OUTPATIENT_CLINIC_OR_DEPARTMENT_OTHER): Payer: 59 | Admitting: Oncology

## 2013-08-24 ENCOUNTER — Other Ambulatory Visit (HOSPITAL_BASED_OUTPATIENT_CLINIC_OR_DEPARTMENT_OTHER): Payer: 59

## 2013-08-24 ENCOUNTER — Telehealth: Payer: Self-pay | Admitting: Oncology

## 2013-08-24 VITALS — BP 124/79 | HR 91 | Temp 98.1°F | Resp 18 | Ht 69.0 in | Wt 222.8 lb

## 2013-08-24 DIAGNOSIS — C50419 Malignant neoplasm of upper-outer quadrant of unspecified female breast: Secondary | ICD-10-CM

## 2013-08-24 DIAGNOSIS — C50411 Malignant neoplasm of upper-outer quadrant of right female breast: Secondary | ICD-10-CM

## 2013-08-24 DIAGNOSIS — Z170 Estrogen receptor positive status [ER+]: Secondary | ICD-10-CM

## 2013-08-24 DIAGNOSIS — D0511 Intraductal carcinoma in situ of right breast: Secondary | ICD-10-CM

## 2013-08-24 LAB — CBC WITH DIFFERENTIAL/PLATELET
BASO%: 0.4 % (ref 0.0–2.0)
Basophils Absolute: 0 10*3/uL (ref 0.0–0.1)
EOS%: 1.1 % (ref 0.0–7.0)
Eosinophils Absolute: 0.1 10*3/uL (ref 0.0–0.5)
HCT: 43.3 % (ref 34.8–46.6)
HGB: 13.9 g/dL (ref 11.6–15.9)
LYMPH%: 25.5 % (ref 14.0–49.7)
MCH: 27 pg (ref 25.1–34.0)
MCHC: 32 g/dL (ref 31.5–36.0)
MCV: 84.3 fL (ref 79.5–101.0)
MONO#: 0.2 10*3/uL (ref 0.1–0.9)
MONO%: 4.2 % (ref 0.0–14.0)
NEUT#: 4.1 10*3/uL (ref 1.5–6.5)
NEUT%: 68.8 % (ref 38.4–76.8)
PLATELETS: 275 10*3/uL (ref 145–400)
RBC: 5.14 10*6/uL (ref 3.70–5.45)
RDW: 13.4 % (ref 11.2–14.5)
WBC: 5.9 10*3/uL (ref 3.9–10.3)
lymph#: 1.5 10*3/uL (ref 0.9–3.3)

## 2013-08-24 LAB — COMPREHENSIVE METABOLIC PANEL (CC13)
ALK PHOS: 89 U/L (ref 40–150)
ALT: 9 U/L (ref 0–55)
AST: 14 U/L (ref 5–34)
Albumin: 3.7 g/dL (ref 3.5–5.0)
Anion Gap: 9 mEq/L (ref 3–11)
BILIRUBIN TOTAL: 0.37 mg/dL (ref 0.20–1.20)
BUN: 17.3 mg/dL (ref 7.0–26.0)
CO2: 22 mEq/L (ref 22–29)
Calcium: 9.9 mg/dL (ref 8.4–10.4)
Chloride: 108 mEq/L (ref 98–109)
Creatinine: 0.9 mg/dL (ref 0.6–1.1)
Glucose: 79 mg/dl (ref 70–140)
Potassium: 3.9 mEq/L (ref 3.5–5.1)
SODIUM: 139 meq/L (ref 136–145)
TOTAL PROTEIN: 7.8 g/dL (ref 6.4–8.3)

## 2013-08-24 NOTE — Progress Notes (Signed)
Checked in new patient with no financial issues prior to seeing the dr. She has appt card and has not been out of the country. I advised her of the alight fund if/when she has to have treatment.

## 2013-08-24 NOTE — Progress Notes (Signed)
[place]  [phone] Fax:([phone]     ID: ZARIYA MINNER DOB: 15-Jun-1975  MR#: [id number]  WEX#:[id number]  Patient Care Team: Halford Chessman, MD as PCP - General (Family Medicine) Chauncey Cruel, MD as Consulting Physician (Oncology) Thea Silversmith, MD as Consulting Physician (Radiation Oncology) Joyice Faster. Cornett, MD as Consulting Physician (General Surgery) OTHER MD:  CHIEF COMPLAINT: Estrogen receptor positive stage I breast cancer  CURRENT TREATMENT: Awaiting definitive local and systemic therapy   BREAST CANCER HISTORY: The patient had screening mammography at [facility] showing calcifications in the right breast, and was referred to the breast Center 07/06/2013 for additional views. Right diagnostic mammography confirmed a group of amorphous calcifications in the upper outer right breast measuring 1.2 cm.  Biopsy of this area 07/17/2013 showed ([id number]) ductal carcinoma in situ, high-grade, estrogen receptor 100% positive, and progesterone receptor 96% positive, both with strong staining intensity.  The patient was then referred to surgery and on 07/25/2013 underwent bilateral breast MRI. This showed a breast density category CEA. In the right breast there was an area of clumped nodular enhancement measuring 3 cm. There was also a 1.5 cm circumscribed oval mass in the medial portion of the right breast consistent with a fibroadenoma. The left breast was unremarkable, and there were no abnormal appearing lymph nodes.  On 08/08/2013 the patient underwent right lumpectomy and sentinel lymph node sampling. The pathology ([id number] was (showed, in addition to ductal carcinoma in situ, invasive ductal carcinoma, measuring 0.7 cm, grade 3, estrogen receptor 86% positive, progesterone receptor 89% positive, both with strong staining intensity, with an MIB-1 of 62%, and no HER-2 amplification. Both sentinel lymph nodes were clear. Margins  were ample.   The patient's subsequent history is as detailed below  INTERVAL HISTORY: When he was evaluated in the breast clinic 08/24/2013 accompanied by her mother. Her case was also discussed in the multidisciplinary breast cancer conference 08/23/2013  REVIEW OF SYSTEMS: She tolerated her lumpectomy well, with no significant pain, bleeding, or fever. She is concerned about her weight. She is not exercising except occasionally, mostly by walking. Otherwise there were no specific symptoms leading to the original mammogram, which was routinely scheduled. The patient denies unusual headaches, visual changes, nausea, vomiting, stiff neck, dizziness, or gait imbalance. There has been no cough, phlegm production, or pleurisy, no chest pain or pressure, and no change in bowel or bladder habits. The patient denies fever, rash, bleeding, unexplained fatigue or unexplained weight loss. A detailed review of systems was otherwise entirely negative.  PAST MEDICAL HISTORY: Past Medical History  Diagnosis Date  . Fibroids     uterine fibroids  . Anemia   . Thyroid disease   . Contact lens/glasses fitting     wears contacts or glasses  . Sleep apnea     has a cpap-does not always use it    PAST SURGICAL HISTORY: Past Surgical History  Procedure Laterality Date  . [place]  2012    urerine ablasion  . Eye surgery  2013    retinal tear-lazer-both  . Wisdom tooth extraction      FAMILY HISTORY Family History  Problem Relation Age of Onset  . Cancer Father 51    colon  . Cancer Maternal Aunt 50    breast cancer  . Cancer Paternal Aunt 13    breast cancer  . Cancer Maternal Grandmother 13    ovarian or uterine cancer  . Cancer Paternal Grandmother  unknown primary   the patient's father died at the age of 40, been diagnosed with colon cancer at the age of 40. The patient's mother is living, currently 67 years old. The patient has one brother, no sisters. One of the patient's mother's  sisters was diagnosed with breast cancer at the age of 50 in one of the patient's father's mother was diagnosed with breast cancer at the age of 34. There is no history of ovarian cancer in the family.  GYNECOLOGIC HISTORY:  Patient's last menstrual period was 08/07/2013. Menarche age 49, for the patient is GX P0. She still having regular periods. 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 She is single and her mother lives with her.(She is wheelchair-bound secondary to her prior stroke).    ADVANCED DIRECTIVES: Not in place; at the 08/24/2013 visit the patient was given the appropriate documents to come feel notarize associate may declare healthcare power of attorney.   HEALTH MAINTENANCE: History  Substance Use Topics  . Smoking status: Never Smoker   . Smokeless tobacco: Not on file  . Alcohol Use: No     Colonoscopy:  PAP:  Bone density:  Lipid panel:  Allergies  Allergen Reactions  . Gadolinium      Desc: NAUSEA WITH MRI CONTRAST-NO VOMITING   . Tramadol Hives and Nausea Only    Current Outpatient Prescriptions  Medication Sig Dispense Refill  . Barberry-Oreg Grape-Goldenseal (BERBERINE COMPLEX PO) Take 500 mg by mouth 3 (three) times daily.      . Cholecalciferol (VITAMIN D3) 3000 UNITS TABS Take 10,000 Units by mouth daily.      . ciprofloxacin (CIPRO) 500 MG tablet Take 1 tablet (500 mg total) by mouth 2 (two) times daily.  20 tablet  0  . magnesium gluconate (MAGONATE) 500 MG tablet Take 400 mg by mouth Nightly.      . progesterone 200 MG SUPP Place 200 mg vaginally at bedtime.      . promethazine (PHENERGAN) 25 MG tablet Take 25 mg by mouth every 6 (six) hours as needed for nausea or vomiting.      . thyroid (ARMOUR) 130 MG tablet Take 130 mg by mouth daily.      [name] UNABLE TO FIND Take 1 tablet by mouth daily. DIM - per pt - obtain  supplement at [employer]- used to " get rid of extra estrogen in the body "      . vitamin B-12 (CYANOCOBALAMIN) 1000 MCG tablet Take 5,000 mcg by mouth daily.       No current facility-administered medications for this visit.    OBJECTIVE: Middle-aged [place] American woman in no acute distress Filed Vitals:   08/24/13 1311  BP: 124/79  Pulse: 91  Temp: 98.1 F (36.7 C)  Resp: 18     Body mass index is 32.89 kg/(m^2).    ECOG FS:0 - Asymptomatic  Ocular: Sclerae unicteric, pupils equal, round and reactive to light Ear-nose-throat: Oropharynx clear and moist Lymphatic: No cervical or supraclavicular adenopathy Lungs no rales or rhonchi, good excursion bilaterally Heart regular rate and rhythm, no murmur appreciated Abd soft, obese, nontender, positive bowel sounds MSK no focal spinal tenderness, no joint edema Neuro: non-focal, well-oriented, appropriate affect Breasts: The right breast is status post recent lumpectomy. The cosmetic result is excellent. There is no dehiscence, erythema, swelling, or unusual tenderness. The right axilla is benign. The left breast is  unremarkable   LAB RESULTS:  CMP     Component Value Date/Time   NA 139 08/24/2013 1229   NA 139 08/04/2013 1245   K 3.9 08/24/2013 1229   K 4.4 08/04/2013 1245   CL 103 08/04/2013 1245   CO2 22 08/24/2013 1229   CO2 23 08/04/2013 1245   GLUCOSE 79 08/24/2013 1229   GLUCOSE 76 08/04/2013 1245   BUN 17.3 08/24/2013 1229   BUN 20 08/04/2013 1245   CREATININE 0.9 08/24/2013 1229   CREATININE 0.68 08/04/2013 1245   CALCIUM 9.9 08/24/2013 1229   CALCIUM 10.0 08/04/2013 1245   PROT 7.8 08/24/2013 1229   PROT 7.4 08/04/2013 1245   ALBUMIN 3.7 08/24/2013 1229   ALBUMIN 4.0 08/04/2013 1245   AST 14 08/24/2013 1229   AST 15 08/04/2013 1245   ALT 9 08/24/2013 1229   ALT 12 08/04/2013 1245   ALKPHOS 89 08/24/2013 1229   ALKPHOS 106 08/04/2013 1245   BILITOT 0.37 08/24/2013 1229   BILITOT 0.2* 08/04/2013 1245   GFRNONAA >90 08/04/2013  1245   GFRAA >90 08/04/2013 1245    I No results found for this basename: SPEP,  UPEP,   kappa and lambda light chains    Lab Results  Component Value Date   WBC 5.9 08/24/2013   NEUTROABS 4.1 08/24/2013   HGB 13.9 08/24/2013   HCT 43.3 08/24/2013   MCV 84.3 08/24/2013   PLT 275 08/24/2013      Chemistry      Component Value Date/Time   NA 139 08/24/2013 1229   NA 139 08/04/2013 1245   K 3.9 08/24/2013 1229   K 4.4 08/04/2013 1245   CL 103 08/04/2013 1245   CO2 22 08/24/2013 1229   CO2 23 08/04/2013 1245   BUN 17.3 08/24/2013 1229   BUN 20 08/04/2013 1245   CREATININE 0.9 08/24/2013 1229   CREATININE 0.68 08/04/2013 1245      Component Value Date/Time   CALCIUM 9.9 08/24/2013 1229   CALCIUM 10.0 08/04/2013 1245   ALKPHOS 89 08/24/2013 1229   ALKPHOS 106 08/04/2013 1245   AST 14 08/24/2013 1229   AST 15 08/04/2013 1245   ALT 9 08/24/2013 1229   ALT 12 08/04/2013 1245   BILITOT 0.37 08/24/2013 1229   BILITOT 0.2* 08/04/2013 1245       No results found for this basename: LABCA2    No components found with this basename: LABCA125    No results found for this basename: INR,  in the last 168 hours  Urinalysis    Component Value Date/Time   COLORURINE YELLOW 10/21/2009 1440   APPEARANCEUR CLEAR 10/21/2009 1440   LABSPEC 1.012 10/21/2009 1440   PHURINE 6.0 10/21/2009 1440   GLUCOSEU NEGATIVE 10/21/2009 1440   HGBUR NEGATIVE 10/21/2009 1440   BILIRUBINUR NEGATIVE 10/21/2009 1440   KETONESUR 15* 10/21/2009 1440   PROTEINUR NEGATIVE 10/21/2009 1440   UROBILINOGEN 0.2 10/21/2009 1440   NITRITE NEGATIVE 10/21/2009 1440   LEUKOCYTESUR NEGATIVE MICROSCOPIC NOT DONE ON URINES WITH NEGATIVE PROTEIN, BLOOD, LEUKOCYTES, NITRITE, OR GLUCOSE <1000 mg/dL. 10/21/2009 1440    STUDIES: Mr Breast Bilateral W Wo Contrast  07/25/2013   CLINICAL DATA:  Recently diagnosed right breast DCIS on stereotactic core biopsy. Pre-surgical evaluation.  EXAM: BILATERAL BREAST MRI WITH AND WITHOUT CONTRAST  TECHNIQUE:  Multiplanar, multisequence MR images of both breasts were obtained prior to and following the intravenous administration of 20 Dml of MultiHance  THREE-DIMENSIONAL MR IMAGE RENDERING ON INDEPENDENT WORKSTATION:  Three-dimensional MR images were rendered by post-processing of the original MR data on an independent workstation. The three-dimensional MR images were interpreted, and findings are reported in the following complete MRI report for this study. Three dimensional images were evaluated at the independent DynaCad workstation  COMPARISON:  07/17/2013, 07/06/2013, 06/22/2013 mammograms.  FINDINGS: Breast composition: c:  Heterogeneous fibroglandular tissue  Background parenchymal enhancement: Marked  Right breast: There is clumped nodular enhancement with central clip artifact associated with the posterior 1/3 of the upper-outer quadrant of the right breast. This is in the area the patient's recent stereotactic core biopsy. The enhancement measures 3.0 x 1.7 x 1.5 cm in size. There is marked background parenchymal enhancement. There is a 1.5 cm circumscribed, oval mass located within the medial portion of the right breast with central dense calcification on the mammogram. This does not significantly enhance and is consistent with a benign fibroadenoma.  Left breast: There is marked background parenchymal enhancement. There are no worrisome enhancing foci.  Lymph nodes: No abnormal appearing lymph nodes.  Ancillary findings:  None.  IMPRESSION: 1. 3.0 cm area of clumped nodular non mass enhancement with associated central clip artifact located within the posterior 1/3 of the right upper outer quadrant corresponding to the recently diagnosed right breast DCIS. There is marked bilateral parenchymal background enhancement. There are no additional worrisome enhancing foci and there is no evidence for adenopathy.  RECOMMENDATION: Treatment plan  BI-RADS CATEGORY  6: Known biopsy-proven malignancy.   Electronically  Signed   By: [name] Robertson M.D.   On: 07/25/2013 11:49   Nm Sentinel Node Inj-no Rpt (breast)  08/08/2013   CLINICAL DATA: dcis   Sulfur colloid was injected intradermally by the nuclear medicine  technologist for breast cancer sentinel node localization.    Mm Breast Surgical Specimen  08/08/2013   CLINICAL DATA:  Intraoperative localization, Previous radioactive seed placement  EXAM: SPECIMEN RADIOGRAPH OF THE right BREAST  COMPARISON:  Previous exam(s)  FINDINGS: Status post excision of the right breast. The radioactive seed and biopsy marker clip are present and are marked for pathology.  IMPRESSION: Specimen radiograph of the right breast.   Electronically Signed   By: Conchita Paris M.D.   On: 08/08/2013 08:25   Mm Rt Plc Breast Loc Dev   1st Lesion  Inc Mammo Guide  08/03/2013   CLINICAL DATA:  Patient with recent diagnosis right breast ductal carcinoma in situ, for preoperative localization.  EXAM: MAMMOGRAPHIC GUIDED RADIOACTIVE SEED LOCALIZATION OF THE RIGHT BREAST  COMPARISON:  Previous exam(s)  FINDINGS: Patient presents for radioactive seed localization prior to right breast lumpectomy. I met with the patient and we discussed the procedure of seed localization including benefits and alternatives. We discussed the high likelihood of a successful procedure. We discussed the risks of the procedure including infection, bleeding, tissue injury and further surgery. We discussed the low dose of radioactivity involved in the procedure. Informed, written consent was given.  The usual time-out protocol was performed immediately prior to the procedure.  Using mammographic guidance, sterile technique, 2% lidocaine and an I-125 radioactive seed, biopsy marking clip within the lateral right breast was localized using a lateral approach. The follow-up mammogram images confirm the seed in the expected location and are marked for Dr. Brantley Stage.  Follow-up survey of the patient confirms presence of radioactive  seed adjacent to the biopsy marking clip.  Order number of I-125 seed:  [id number].  Dose of I-125 seed:  0.252 mCi.  The patient tolerated the  procedure well and was released from the Breast Center. She was given instructions regarding seed removal.  IMPRESSION: Radioactive seed localization right breast. No apparent complications.   Electronically Signed   By: Lovey Newcomer M.D.   On: 08/03/2013 13:33    ASSESSMENT: 38 y.o. [place] woman status post right breast biopsy 07/17/2013 for high-grade ductal carcinoma in situ, estrogen and progesterone receptor positive  (1) status post right lumpectomy and sentinel lymph node sampling 08/08/2013 for a pT1b pN0, stage IA invasive ductal carcinoma, grade 3, estrogen and progesterone receptor positive, with an MIB-1 of 62% and no HER-2 amplification  (2) genetics testing sent 08/02/2013, results pending  (3) Oncotype sent 08/24/2013, results pending  PLAN: We spent approximately 1 hour going over when these situation in detail today. She had understood her tumor to be noninvasive, and she had been told somewhere that she would need no treatment other than radiation. He was very distressing for her to learn that she had invasive disease and the chemotherapy, while unlikely, was not out of the question.  She understands the difference between local and systemic treatment, and also that surgery and radiation while providing excellent local control would not control any microscopic disease that might already be present elsewhere in her body. For that reason she needs to consider systemic therapy. Certainly she will benefit from antiestrogen 6 grade we briefly discussed the possible toxicities, side effects and complications of tamoxifen versus the aromatase inhibitors, and of course with her being premenopausal tamoxifen would be the drug of choice.  The decision chemotherapy is more complex. She is a young woman with an aggressive but small breast cancer. NCCN  guidelines suggest an Oncotype, and this was sent today. If it shows an intermediate score, then we will consider chemotherapy, but I would think the benefit would be marginal. Of course if it is in the low risk "box" she would not get chemotherapy and if it falls in the high-risk box she would need chemotherapy.  Kitana has a good understanding of all this. She already has an appointment with Dr. Pablo Ledger to discuss radiation therapy, which of course would start after her chemotherapy if chemotherapy turns out to be necessary. I have made the patient's a return appointment with me in approximately 8 weeks, with the expectation that she will not need chemotherapy, but we will discuss the results of her Oncotype as soon as it becomes available.  There is also genetic testing in process. If positive of course she will need bilateral salpingo-oophorectomy and that would opened door to the possibility of using aromatase inhibitors.  The patient has a good understanding of the overall plan. She agrees with it. She knows the goal of treatment in her case is cure. She will call with any problems that may develop before her next visit here.  Chauncey Cruel, MD   08/24/2013 6:51 PM

## 2013-08-24 NOTE — Progress Notes (Signed)
Location of Breast Cancer:Right breast  Invasive ductal carcinoma right upper quadrant 10 o'clock  Histology per Pathology Report: 08/08/2013 Diagnosis 1. Breast, lumpectomy, Right - INVASIVE DUCTAL CARCINOMA, SEE COMMENT. - NEGATIVE FOR LYMPH VASCULAR INVASION. - INVASIVE TUMOR IS 1.2 CM FROM THE NEAREST MARGIN (LATERAL). - HIGH GRADE DUCTAL CARCINOMA IN SITU. - PREVIOUS BIOPSY SITE. - SEE TUMOR SYNOPTIC TEMPLATE BELOW. 2. Breast, excision, Right anterior additional margin - BENIGN BREAST TISSUE, SEE COMMENT. - NEGATIVE FOR ATYPIA OR MALIGNANCY. - SURGICAL MARGIN, NEGATIVE FOR ATYPIA OR MALIGNANCY. 3. Breast, excision, Right posterior additional margin - BENIGN BREAST TISSUE, SEE COMMENT. - NEGATIVE FOR ATYPIA OR MALIGNANCY. - SURGICAL MARGIN, NEGATIVE FOR ATYPIA OR MALIGNANCY. 4. Breast, excision, Right superior additional margin - BENIGN BREAST TISSUE, SEE COMMENT. - NEGATIVE FOR ATYPIA OR MALIGNANCY. - SURGICAL MARGINS, NEGATIVE FOR ATYPIA OR MALIGNANCY. 5. Breast, excision, Right inferior additional margin - BENIGN BREAST TISSUE, SEE COMMENT. - NEGATIVE FOR ATYPIA OR MALIGNANCY. - SURGICAL MARGIN, NEGATIVE FOR ATYPIA OR MALIGNANCY. 6. Breast, excision, Right medial additional margin - BENIGN BREAST TISSUE, SEE COMMENT. - NEGATIVE FOR ATYPIA OR MALIGNANCY. - SURGICAL MARGIN, NEGATIVE FOR ATYPIA OR MALIGNANCY. 7. Breast, excision, Right lateral additional margin - DUCTAL CARCINOMA IN SITU, SEE COMMENT. - IN SITU CARCINOMA IS 6 MM FROM THE NEAREST MARGIN. 8. Lymph node, sentinel, biopsy, Right axillary - ONE LYMPH NODE, NEGATIVE FOR TUMOR (0/1). 9. Lymph node, sentinel, biopsy, right axillary - ONE LYMPH NODE, NEGATIVE FOR TUMOR (0/1).  07/17/2013 Diagnosis Breast, right, needle core biopsy, 10 o'clock, posterior - HIGH GRADE DUCTAL CARCINOMA IN SITU WITH COMEDO-TYPE NECROSIS. SEE COMMENT.  Receptor Status: ER(+), PR (+), Her2-neu ( no amplification)  Did patient  present with symptoms (if so, please note symptoms) or was this found on screening mammography?:Screening mamography performed t [facility]  Past/Anticipated interventions by surgeon, if any:08/08/2013 RADIOACTIVE SEED GUIDED PARTIAL MASTECTOMY WITH AXILLARY SENTINEL LYMPH NODE BIOPSY by Dr.Thomas Cornett.  Past/Anticipated interventions by medical oncology, if any: Chemotherapy  Oncotype pending, sent on 08/24/13. Genetic testing sent 08/02/13.  Lymphedema issues, if any:No  Pain issues, if any: No   SAFETY ISSUES:  Prior radiation? No  Pacemaker/ICD? No  Possible current pregnancy?No last menstrual period 08/07/13  Is the patient on methotrexate?No   Current Complaints / other details: Single,Menarche age 18, GX PO.Last menstrual period 08/07/13.  Allergies:Galladium and tramadol Maternal aunt diagnosed with breast cancer in her 29's. Right breast seroma drained with 10 cc's blood-tinged  fluid on 08/28/13 by Dr.Cornett, additional week of treatment with cipro.    Arlyss Repress, RN 08/24/2013,10:51 AM

## 2013-08-24 NOTE — Assessment & Plan Note (Signed)
38 y.o.  [place] woman status post right breast biopsy 07/17/2013 for high-grade ductal carcinoma in situ, estrogen and progesterone receptor positive  (1) status post right lumpectomy and sentinel lymph node sampling 08/08/2013 for a pT1b pN0, stage IA invasive ductal carcinoma, grade 3, estrogen and progesterone receptor positive, with an MIB-1 of 62% and no HER-2 amplification  (2) genetics testing sent 08/02/2013, results pending  (3) Oncotype sent 08/24/2013, results pending

## 2013-08-24 NOTE — Progress Notes (Signed)
Received order from Dr. Jana Hakim for oncotype testing. Requisition sent to pathology.

## 2013-08-25 ENCOUNTER — Telehealth (INDEPENDENT_AMBULATORY_CARE_PROVIDER_SITE_OTHER): Payer: Self-pay

## 2013-08-25 NOTE — Telephone Encounter (Signed)
Pt calling in b/c she is wondering if it's to be expected to still have drainage coming from the rt br seroma.Pt is a  s/p rt seed lumpectomy done 7/14 by Dr Brantley Stage and ended up seeing Dr Redmond Pulling on 7/21 to have a br seroma drained of 100cc's. The pt has a f/u appt with Dr Brantley Stage on 8/3 but the pt said she notices sometimes that when she moves around more the drainage will be increased. The pt said she when she lies down at night she can her the sloshing sound of the fluid in the breast. I asked if the pt felt any discomfort today with the drainage that she would need to be seen by one of the partners but pt felt fine waiting till Monday. I explained to pt that sometimes with a seroma it takes a while for the cavity to close down to completely go away. I advised pt that Dr Brantley Stage will access the issue when she comes in Monday and he will advise her if the seroma needs to be aspirated again. Pt understands.

## 2013-08-28 ENCOUNTER — Encounter (INDEPENDENT_AMBULATORY_CARE_PROVIDER_SITE_OTHER): Payer: Self-pay | Admitting: Surgery

## 2013-08-28 ENCOUNTER — Ambulatory Visit (INDEPENDENT_AMBULATORY_CARE_PROVIDER_SITE_OTHER): Payer: 59 | Admitting: Surgery

## 2013-08-28 VITALS — BP 124/82 | HR 78 | Resp 14 | Ht 69.0 in | Wt 224.0 lb

## 2013-08-28 DIAGNOSIS — T792XXA Traumatic secondary and recurrent hemorrhage and seroma, initial encounter: Secondary | ICD-10-CM

## 2013-08-28 DIAGNOSIS — IMO0002 Reserved for concepts with insufficient information to code with codable children: Secondary | ICD-10-CM

## 2013-08-28 DIAGNOSIS — T888XXA Other specified complications of surgical and medical care, not elsewhere classified, initial encounter: Principal | ICD-10-CM

## 2013-08-28 MED ORDER — CIPROFLOXACIN HCL 500 MG PO TABS: 500.0000 mg | ORAL_TABLET | Freq: Two times a day (BID) | ORAL | Status: DC

## 2013-08-28 NOTE — Progress Notes (Signed)
Patient returns 3 weeks after right breast lumpectomy and sentinel lymph node mapping for stage I right breast cancer and DCIS. She has issues with postoperative seroma and this was drained by Dr. Redmond Pulling 2 weeks ago. Cultures did grow Pseudomonas which was pansensitive and she was placed on Cipro. The wound had been draining but now postop. She is doing much better. She is followed by medical oncology and Oncotype and genetics pending.   Exam: Right breast incision clean dry and intact. Mild seroma noted.  This was aspirated a sterile conditions and 10 cc of blood-tinged fluid noted. No evidence of redness or fluctuance.  Pathology:1. Breast, lumpectomy, Right - INVASIVE DUCTAL CARCINOMA, SEE COMMENT. - NEGATIVE FOR LYMPH VASCULAR INVASION. - INVASIVE TUMOR IS 1.2 CM FROM THE NEAREST MARGIN (LATERAL). - HIGH GRADE DUCTAL CARCINOMA IN SITU. - PREVIOUS BIOPSY SITE. - SEE TUMOR SYNOPTIC TEMPLATE BELOW. 2. Breast, excision, Right anterior additional margin - BENIGN BREAST TISSUE, SEE COMMENT. - NEGATIVE FOR ATYPIA OR MALIGNANCY. - SURGICAL MARGIN, NEGATIVE FOR ATYPIA OR MALIGNANCY. 3. Breast, excision, Right posterior additional margin - BENIGN BREAST TISSUE, SEE COMMENT. - NEGATIVE FOR ATYPIA OR MALIGNANCY. - SURGICAL MARGIN, NEGATIVE FOR ATYPIA OR MALIGNANCY. 4. Breast, excision, Right superior additional margin - BENIGN BREAST TISSUE, SEE COMMENT. - NEGATIVE FOR ATYPIA OR MALIGNANCY. - SURGICAL MARGINS, NEGATIVE FOR ATYPIA OR MALIGNANCY. 5. Breast, excision, Right inferior additional margin - BENIGN BREAST TISSUE, SEE COMMENT. - NEGATIVE FOR ATYPIA OR MALIGNANCY. - SURGICAL MARGIN, NEGATIVE FOR ATYPIA OR MALIGNANCY. 6. Breast, excision, Right medial additional margin - BENIGN BREAST TISSUE, SEE COMMENT. - NEGATIVE FOR ATYPIA OR MALIGNANCY. - SURGICAL MARGIN, NEGATIVE FOR ATYPIA OR MALIGNANCY. 7. Breast, excision, Right lateral additional margin - DUCTAL CARCINOMA IN SITU, SEE  COMMENT. - IN SITU CARCINOMA IS 6 MM FROM THE NEAREST MARGIN. 8. Lymph node, sentinel, biopsy, Right axillary - ONE LYMPH NODE, NEGATIVE FOR TUMOR (0/1). 9. Lymph node, sentinel, biopsy, right axillary - ONE LYMPH NODE, NEGATIVE FOR TUMOR (0/1). Microscopic Comment 1. BREAST, INVASIVE TUMOR, WITH LYMPH NODE SAMPLING Specimen, including laterality and lymph node sampling (sentinel, non-sentinel): Right breast with sentinel lymph node sampling 2 of 5 FINAL for DORRENE, BENTLY D ([id number]) Microscopic Comment(continued) Procedure: Lumpectomy Histologic type: Ductal Grade: III of III Tubule formation: 3 Nuclear pleomorphism: 2 Mitotic: 2 Tumor size (glass slide measurement): 0.7 cm Margins: Invasive, distance to closest margin: 1.2 cm In-situ, distance to closest margin: 1.2 cm (lateral) If margin positive, focally or broadly: N/A Lymphovascular invasion: Absent Ductal carcinoma in situ: Present Grade: III of III Extensive intraductal component: Absent Lobular neoplasia: Absent Tumor focality: Unifocal Treatment effect: None If present, treatment effect in breast tissue, lymph nodes or both: N/A Extent of tumor: Skin: N/A Nipple: N/A Skeletal muscle: N/A Lymph nodes: Examined: 2 Sentinel 0 Non-sentinel 2 Total Lymph nodes with metastasis: 0 Isolated tumor cells (< 0.2 mm): N/A Micrometastasis: (> 0.2 mm and < 2.0 mm): N/A Macrometastasis: (> 2.0 mm): N/A Extracapsular extension: N/A Breast prognostic profile: (performed on invasive carcinoma) Estrogen receptor: Pending and will be reported in an addendum. Progesterone receptor: Pending and will be reported in an addendum. Her 2 neu: Pending and will be reported in an addendum. Ki-67: Pending and will be reported in an addendum. Non-neoplastic breast: Previous biopsy site, fibrocystic change, columnar cell hyperplasia/change without atypia. TNM: pT1b, pN0, pMX Comments: Within the 2.0 cm localized area grossly  identified, there is a 7 mm focus of high grade invasive ductal carcinoma associated with high  grade ductal carcinoma in situ with comedo-type necrosis. Please see parts 2-7 for the additional anterior, posterior, superior, inferior, medial and lateral margins. (CR:kh 08-09-13) 2. The surgical resection margin(s) of the specimen were inked and microscopically evaluated. 3. The surgical resection margin(s) of the specimen were inked and microscopically evaluated. 4. The surgical resection margin(s) of the specimen were inked and microscopically evaluated. 6. The surgical resection margin(s) of the specimen were inked and microscopically evaluated. 7. There is no mass grossly identified. Multiple representative sections demonstrate a solitary focus of high grade ductal carcinoma in situ (slide 7C). The focus is 1 mm in greatest dimension and is 6 mm from the nearest surgical margin. [place]i RUND DO Pathologist, Electronic Signature (Case signed 08/09/2013)       Impression: Stage I right breast cancer and DCIS 3 weeks status post lumpectomy and sentinel lymph node mapping  Plan: Return in 2 weeks. Increase activity as tolerated. 5 more days of Cipro. Follow up with oncology.

## 2013-08-28 NOTE — Patient Instructions (Signed)
Ok to resume activity as tolerated.  5 more days of cipro. Return 2 weeks.

## 2013-08-30 ENCOUNTER — Ambulatory Visit
Admission: RE | Admit: 2013-08-30 | Discharge: 2013-08-30 | Disposition: A | Discharge status: Home / Self Care | Payer: 59 | Source: Ambulatory Visit | Attending: Radiation Oncology | Admitting: Radiation Oncology

## 2013-08-30 ENCOUNTER — Encounter: Payer: Self-pay | Admitting: Radiation Oncology

## 2013-08-30 VITALS — BP 135/82 | HR 83 | Temp 98.1°F | Wt 223.3 lb

## 2013-08-30 DIAGNOSIS — C50419 Malignant neoplasm of upper-outer quadrant of unspecified female breast: Secondary | ICD-10-CM | POA: Insufficient documentation

## 2013-08-30 DIAGNOSIS — Z510 Encounter for antineoplastic radiation therapy: Secondary | ICD-10-CM | POA: Insufficient documentation

## 2013-08-30 DIAGNOSIS — Z803 Family history of malignant neoplasm of breast: Secondary | ICD-10-CM | POA: Diagnosis not present

## 2013-08-30 DIAGNOSIS — C50411 Malignant neoplasm of upper-outer quadrant of right female breast: Secondary | ICD-10-CM

## 2013-08-30 DIAGNOSIS — Z170 Estrogen receptor positive status [ER+]: Secondary | ICD-10-CM | POA: Diagnosis not present

## 2013-08-30 NOTE — Progress Notes (Signed)
Radiation Oncology         [phone]) [phone] ________________________________  Initial outpatient Consultation - Date: 08/30/2013   Name: Jennifer Frazier MRN: [id number]   DOB: [birth year]  REFERRING PHYSICIAN: Brantley Stage Joyice Faster., MD  DIAGNOSIS:    ICD-9-CM  1. Breast cancer of upper-outer quadrant of right female breast 174.4    STAGE: T1bN0 Invasive Ductal Carcinoma of the Right Breast  HISTORY OF PRESENT ILLNESS::Jennifer Frazier is a 38 y.o. female  Who underwent a screening mammogram and was found to have calcifications. These were biopsied and found to be DCIS which was high grade with comedonecrosis. She elected for breast conservation and underwent a lumpectomy and sentinel lymph node biopsy on 08/08/13. Margins were negative and a 7 mm area of invasive disease was found with associated high grade DCIS. The tumor was strongly ER and PR psotive but the Ki67 was 64%. She has been genetically tested and an Oncotype has been sent. She is accompanied by her mother today who is wheelchair bound since a stroke.  She is GxP0 and is currently menstruating. She had menarche at age 62. She relays difficulties with "high estrogen levels" and did not respond to Lupron when she was given this injection for fibroids.  She is also followed by [facility].Her postoperative course has been complicated by a seroma cavity infected with pseudomonas for which she is finishing antibiotics.   PREVIOUS RADIATION THERAPY: No  PAST MEDICAL HISTORY:  has a past medical history of Fibroids; Anemia; Thyroid disease; Contact lens/glasses fitting; and Sleep apnea.    PAST SURGICAL HISTORY: Past Surgical History  Procedure Laterality Date  . [place]  2012    urerine ablasion  . Eye surgery  2013    retinal tear-lazer-both  . Wisdom tooth extraction    . Breast surgery      lumpectomy 7/14    FAMILY HISTORY:  Family History  Problem Relation Age of Onset  . Cancer Father 71    colon  . Cancer Maternal Aunt 50      breast cancer  . Cancer Paternal Aunt 41    breast cancer  . Cancer Maternal Grandmother 72    ovarian or uterine cancer  . Cancer Paternal Grandmother     unknown primary    SOCIAL HISTORY:  History  Substance Use Topics  . Smoking status: Never Smoker   . Smokeless tobacco: Not on file  . Alcohol Use: No    ALLERGIES: Gadolinium and Tramadol  MEDICATIONS:  Current Outpatient Prescriptions  Medication Sig Dispense Refill  . Barberry-Oreg Grape-Goldenseal (BERBERINE COMPLEX PO) Take 500 mg by mouth 3 (three) times daily.      . Cholecalciferol (VITAMIN D3) 3000 UNITS TABS Take 10,000 Units by mouth daily.      . ciprofloxacin (CIPRO) 500 MG tablet Take 1 tablet (500 mg total) by mouth 2 (two) times daily.  10 tablet  0  . magnesium gluconate (MAGONATE) 500 MG tablet Take 400 mg by mouth Nightly.      . Omega-3 Fatty Acids (FISH OIL CONCENTRATE PO) Take by mouth.      . progesterone 200 MG SUPP Place 200 mg vaginally at bedtime.      . promethazine (PHENERGAN) 25 MG tablet Take 25 mg by mouth every 6 (six) hours as needed for nausea or vomiting.      . thyroid (ARMOUR) 130 MG tablet Take 130 mg by mouth daily.      [name] UNABLE TO FIND Take 1  tablet by mouth daily. DIM - per pt - obtain supplement at [facility]- used to " get rid of extra estrogen in the body "      . vitamin B-12 (CYANOCOBALAMIN) 1000 MCG tablet Take 5,000 mcg by mouth daily.       No current facility-administered medications for this encounter.    REVIEW OF SYSTEMS:  A 15 point review of systems is documented in the electronic medical record. This was obtained by the nursing staff. However, I reviewed this with the patient to discuss relevant findings and make appropriate changes.  Pertinent items are noted in HPI.  PHYSICAL EXAM:  Filed Vitals:   08/30/13 1045  BP: 135/82  Pulse: 83  Temp: 98.1 F (36.7 C)  .223 lb 4.8 oz (101.288 kg). Pleasant female. Right breast is slightly swollen. Bandage in  place over incision. No palpable abnormalities of the left breast. No palpable axillary adenopathy. Alert and oriented.   LABORATORY DATA:  Lab Results  Component Value Date   WBC 5.9 08/24/2013   HGB 13.9 08/24/2013   HCT 43.3 08/24/2013   MCV 84.3 08/24/2013   PLT 275 08/24/2013   Lab Results  Component Value Date   NA 139 08/24/2013   K 3.9 08/24/2013   CL 103 08/04/2013   CO2 22 08/24/2013   Lab Results  Component Value Date   ALT 9 08/24/2013   AST 14 08/24/2013   ALKPHOS 89 08/24/2013   BILITOT 0.37 08/24/2013     RADIOGRAPHY: Nm Sentinel Node Inj-no Rpt (breast)  08/08/2013   CLINICAL DATA: dcis   Sulfur colloid was injected intradermally by the nuclear medicine  technologist for breast cancer sentinel node localization.    Mm Breast Surgical Specimen  08/08/2013   CLINICAL DATA:  Intraoperative localization, Previous radioactive seed placement  EXAM: SPECIMEN RADIOGRAPH OF THE right BREAST  COMPARISON:  Previous exam(s)  FINDINGS: Status post excision of the right breast. The radioactive seed and biopsy marker clip are present and are marked for pathology.  IMPRESSION: Specimen radiograph of the right breast.   Electronically Signed   By: Conchita Paris M.D.   On: 08/08/2013 08:25   Mm Rt Plc Breast Loc Dev   1st Lesion  Inc Mammo Guide  08/03/2013   CLINICAL DATA:  Patient with recent diagnosis right breast ductal carcinoma in situ, for preoperative localization.  EXAM: MAMMOGRAPHIC GUIDED RADIOACTIVE SEED LOCALIZATION OF THE RIGHT BREAST  COMPARISON:  Previous exam(s)  FINDINGS: Patient presents for radioactive seed localization prior to right breast lumpectomy. I met with the patient and we discussed the procedure of seed localization including benefits and alternatives. We discussed the high likelihood of a successful procedure. We discussed the risks of the procedure including infection, bleeding, tissue injury and further surgery. We discussed the low dose of radioactivity involved  in the procedure. Informed, written consent was given.  The usual time-out protocol was performed immediately prior to the procedure.  Using mammographic guidance, sterile technique, 2% lidocaine and an I-125 radioactive seed, biopsy marking clip within the lateral right breast was localized using a lateral approach. The follow-up mammogram images confirm the seed in the expected location and are marked for Dr. Brantley Stage.  Follow-up survey of the patient confirms presence of radioactive seed adjacent to the biopsy marking clip.  Order number of I-125 seed:  [id number].  Dose of I-125 seed:  0.252 mCi.  The patient tolerated the procedure well and was released from the [facility]. She was given  instructions regarding seed removal.  IMPRESSION: Radioactive seed localization right breast. No apparent complications.   Electronically Signed   By: Lovey Newcomer M.D.   On: 08/03/2013 13:33      IMPRESSION: T1bN0 Invasive Ductal Carcinoma of the Right Breast  PLAN:I spoke to the patient today regarding her diagnosis and options for treatment. We discussed the equivalence in terms of survival and local failure between mastectomy and breast conservation. We discussed the role of radiation in decreasing local failures in patients who undergo lumpectomy. We discussed the process of simulation and the placement tattoos. We discussed 4-6 weeks of treatment as an outpatient. We discussed the possibility of asymptomatic lung damage. We discussed the low likelihood of secondary malignancies. We discussed the possible side effects including but not limited to skin redness, fatigue, permanent skin darkening, and breast swelling.I will see her back after her Oncotype score. I did clarify with her that if she needed chemotherapy this would be performed prior to radiation. We also discussed that if her genetic testing came back positive and she elected for a mastectomy, she would not require post mastectomy radiation. I will plan on  seeing her back after her surgery.  I spent 40 minutes face to face with the patient and more than 50% of that time was spent in counseling and/or coordination of care.   ------------------------------------------------  Thea Silversmith, MD

## 2013-08-30 NOTE — Progress Notes (Signed)
Please see the Nurse Progress Note in the MD Initial Consult Encounter for this patient. 

## 2013-09-01 ENCOUNTER — Telehealth: Payer: Self-pay | Admitting: *Deleted

## 2013-09-01 NOTE — Telephone Encounter (Signed)
Left message for a return phone.  Awaiting patient response.  

## 2013-09-06 ENCOUNTER — Encounter: Payer: Self-pay | Admitting: Genetic Counselor

## 2013-09-06 DIAGNOSIS — D0511 Intraductal carcinoma in situ of right breast: Secondary | ICD-10-CM

## 2013-09-06 DIAGNOSIS — Z800 Family history of malignant neoplasm of digestive organs: Secondary | ICD-10-CM

## 2013-09-06 DIAGNOSIS — Z803 Family history of malignant neoplasm of breast: Secondary | ICD-10-CM

## 2013-09-06 NOTE — Progress Notes (Signed)
HPI:  Jennifer Frazier was previously seen in the [facility] clinic due to a personal and family history of cancer and concerns regarding a hereditary predisposition to cancer. Please refer to our prior cancer genetics clinic note for more information regarding Jennifer Frazier's medical, social and family histories, and our assessment and recommendations, at the time. Jennifer Frazier's recent genetic test results were disclosed to her, as were recommendations warranted by these results. These results and recommendations are discussed in more detail below.  GENETIC TEST RESULTS: At the time of Jennifer Frazier's visit, we recommended she pursue genetic testing of the OvaNext gene panel. This test, which included sequencing and deletion/duplication analysis of the genes, was performed at [facility]. Genetic testing was normal, and did not reveal a deleterious mutation in these genes. A complete list of all genes tested is located on the test report scanned into EPIC.  Genetic testing did identify a variant of uncertain significance called BRCA2, [id number]. At this time, it is unknown if this variant is associated with an increased risk for cancer or if this is a normal finding. With time, we suspect the lab will reclassify this variant and when they do, we will try to re-contact Jennifer Frazier to discuss the reclassification further.   We discussed with Jennifer Frazier that since the current genetic testing is not perfect, it is possible there may be a gene mutation in one of these genes that current testing cannot detect, but that chance is small.  We also discussed, that it is possible that another gene that has not yet been discovered, or that we have not yet tested, is responsible for the cancer diagnoses in the family, and it is, therefore, important to remain in touch with cancer genetics in the future so that we can continue to offer Jennifer Frazier the most up to date genetic testing.   CANCER SCREENING  RECOMMENDATIONS: This result is generally reassuring and suggests that Jennifer Frazier's cancer was most likely not due to an inherited predisposition associated with one of these genes.  Most cancers happen by chance and this negative test suggests that her cancer falls into this category.  We, therefore, recommended she continue to follow the cancer management and screening guidelines provided by her oncology and primary providers.   RECOMMENDATIONS FOR FAMILY MEMBERS:  Women in this family might be at some increased risk of developing cancer, over the general population risk, simply due to the family history of cancer.  We recommended women in this family have a yearly mammogram beginning at age 24, an an annual clinical breast exam, and perform monthly breast self-exams. Women in this family should also have a gynecological exam as recommended by their primary provider. All family members should have a colonoscopy by age 49.  FOLLOW-UP: Lastly, we discussed with Jennifer Frazier that cancer genetics is a rapidly advancing field and it is possible that new genetic tests will be appropriate for her and/or her family members in the future. We encouraged her to remain in contact with cancer genetics on an annual basis so we can update her personal and family histories and let her know of advances in cancer genetics that may benefit this family.   Our contact number was provided. Jennifer Frazier's questions were answered to her satisfaction, and she knows she is welcome to call us at anytime with additional questions or concerns.   Catherine A. Fine, MS, CGC Certified Genetic Counseor [email or web address]

## 2013-09-11 ENCOUNTER — Encounter (HOSPITAL_COMMUNITY): Payer: Self-pay

## 2013-09-11 ENCOUNTER — Encounter: Payer: Self-pay | Admitting: *Deleted

## 2013-09-11 NOTE — Progress Notes (Signed)
Received Oncotype Dx results of 23.  Emailed Dr. Jana Hakim results and placed a copy in his box. Took a copy to HIM to scan.

## 2013-09-12 ENCOUNTER — Other Ambulatory Visit: Payer: Self-pay | Admitting: Oncology

## 2013-09-12 ENCOUNTER — Telehealth: Payer: Self-pay | Admitting: Oncology

## 2013-09-12 ENCOUNTER — Encounter: Payer: Self-pay | Admitting: *Deleted

## 2013-09-12 NOTE — Telephone Encounter (Signed)
, °

## 2013-09-13 ENCOUNTER — Ambulatory Visit (INDEPENDENT_AMBULATORY_CARE_PROVIDER_SITE_OTHER): Payer: 59 | Admitting: Surgery

## 2013-09-13 ENCOUNTER — Encounter (INDEPENDENT_AMBULATORY_CARE_PROVIDER_SITE_OTHER): Payer: Self-pay | Admitting: Surgery

## 2013-09-13 VITALS — BP 160/100 | HR 73 | Temp 98.2°F | Ht 69.0 in | Wt 222.2 lb

## 2013-09-13 DIAGNOSIS — Z9889 Other specified postprocedural states: Secondary | ICD-10-CM

## 2013-09-13 NOTE — Patient Instructions (Signed)
Return 3 months

## 2013-09-13 NOTE — Progress Notes (Signed)
Patient returns 3 weeks after right breast lumpectomy and sentinel lymph node mapping for stage I right breast cancer and DCIS. She has issues with postoperative seroma and this was draine. Cultures did grow Pseudomonas which was pansensitive and she was placed on Cipro. No drainage noted. She is doing much better. She is followed by medical oncology and Oncotype score is 23  Exam: Right breast incision clean dry and intact. No seroma noted. No evidence of redness or fluctuance.   Pathology  :1. Breast, lumpectomy, Right - INVASIVE DUCTAL CARCINOMA, SEE COMMENT. - NEGATIVE FOR LYMPH VASCULAR INVASION. - INVASIVE TUMOR IS 1.2 CM FROM THE NEAREST MARGIN (LATERAL). - HIGH GRADE DUCTAL CARCINOMA IN SITU. - PREVIOUS BIOPSY SITE. - SEE TUMOR SYNOPTIC TEMPLATE BELOW. 2. Breast, excision, Right anterior additional margin - BENIGN BREAST TISSUE, SEE COMMENT. - NEGATIVE FOR ATYPIA OR MALIGNANCY. - SURGICAL MARGIN, NEGATIVE FOR ATYPIA OR MALIGNANCY. 3. Breast, excision, Right posterior additional margin - BENIGN BREAST TISSUE, SEE COMMENT. - NEGATIVE FOR ATYPIA OR MALIGNANCY. - SURGICAL MARGIN, NEGATIVE FOR ATYPIA OR MALIGNANCY. 4. Breast, excision, Right superior additional margin - BENIGN BREAST TISSUE, SEE COMMENT. - NEGATIVE FOR ATYPIA OR MALIGNANCY. - SURGICAL MARGINS, NEGATIVE FOR ATYPIA OR MALIGNANCY. 5. Breast, excision, Right inferior additional margin - BENIGN BREAST TISSUE, SEE COMMENT. - NEGATIVE FOR ATYPIA OR MALIGNANCY. - SURGICAL MARGIN, NEGATIVE FOR ATYPIA OR MALIGNANCY. 6. Breast, excision, Right medial additional margin - BENIGN BREAST TISSUE, SEE COMMENT. - NEGATIVE FOR ATYPIA OR MALIGNANCY. - SURGICAL MARGIN, NEGATIVE FOR ATYPIA OR MALIGNANCY. 7. Breast, excision, Right lateral additional margin - DUCTAL CARCINOMA IN SITU, SEE COMMENT. - IN SITU CARCINOMA IS 6 MM FROM THE NEAREST MARGIN. 8. Lymph node, sentinel, biopsy, Right axillary - ONE LYMPH NODE, NEGATIVE FOR TUMOR  (0/1). 9. Lymph node, sentinel, biopsy, right axillary - ONE LYMPH NODE, NEGATIVE FOR TUMOR (0/1). Microscopic Comment 1. BREAST, INVASIVE TUMOR, WITH LYMPH NODE SAMPLING Specimen, including laterality and lymph node sampling (sentinel, non-sentinel): Right breast with sentinel lymph node sampling 2 of 5 FINAL for Jennifer Frazier, Jennifer Frazier ([id number]) Microscopic Comment(continued) Procedure: Lumpectomy Histologic type: Ductal Grade: III of III Tubule formation: 3 Nuclear pleomorphism: 2 Mitotic: 2 Tumor size (glass slide measurement): 0.7 cm Margins: Invasive, distance to closest margin: 1.2 cm In-situ, distance to closest margin: 1.2 cm (lateral) If margin positive, focally or broadly: N/A Lymphovascular invasion: Absent Ductal carcinoma in situ: Present Grade: III of III Extensive intraductal component: Absent Lobular neoplasia: Absent Tumor focality: Unifocal Treatment effect: None If present, treatment effect in breast tissue, lymph nodes or both: N/A Extent of tumor: Skin: N/A Nipple: N/A Skeletal muscle: N/A Lymph nodes: Examined: 2 Sentinel 0 Non-sentinel 2 Total Lymph nodes with metastasis: 0 Isolated tumor cells (< 0.2 mm): N/A Micrometastasis: (> 0.2 mm and < 2.0 mm): N/A Macrometastasis: (> 2.0 mm): N/A Extracapsular extension: N/A Breast prognostic profile: (performed on invasive carcinoma) Estrogen receptor: Pending and will be reported in an addendum. Progesterone receptor: Pending and will be reported in an addendum. Her 2 neu: Pending and will be reported in an addendum. Ki-67: Pending and will be reported in an addendum. Non-neoplastic breast: Previous biopsy site, fibrocystic change, columnar cell hyperplasia/change without atypia. TNM: pT1b, pN0, pMX Comments: Within the 2.0 cm localized area grossly identified, there is a 7 mm focus of high grade invasive ductal carcinoma associated with high grade ductal carcinoma in situ with comedo-type  necrosis. Please see parts 2-7 for the additional anterior, posterior, superior, inferior, medial and lateral margins. (  CR:kh 08-09-13) 2. The surgical resection margin(s) of the specimen were inked and microscopically evaluated. 3. The surgical resection margin(s) of the specimen were inked and microscopically evaluated. 4. The surgical resection margin(s) of the specimen were inked and microscopically evaluated. 6. The surgical resection margin(s) of the specimen were inked and microscopically evaluated. 7. There is no mass grossly identified. Multiple representative sections demonstrate a solitary focus of high grade ductal carcinoma in situ (slide 7C). The focus is 1 mm in greatest dimension and is 6 mm from the nearest surgical margin. Jennifer RUND DO Pathologist, Electronic Signature (Case signed 08/09/2013)       Impression: Stage I right breast cancer and DCIS 3 weeks status post lumpectomy and sentinel lymph node mapping Oncotype 23.  Plan: Return in 3 months.  Follow up with oncology.

## 2013-09-15 ENCOUNTER — Telehealth: Payer: Self-pay | Admitting: *Deleted

## 2013-09-15 ENCOUNTER — Ambulatory Visit (HOSPITAL_BASED_OUTPATIENT_CLINIC_OR_DEPARTMENT_OTHER): Payer: 59 | Admitting: Oncology

## 2013-09-15 DIAGNOSIS — C50411 Malignant neoplasm of upper-outer quadrant of right female breast: Secondary | ICD-10-CM

## 2013-09-15 DIAGNOSIS — Z170 Estrogen receptor positive status [ER+]: Secondary | ICD-10-CM

## 2013-09-15 DIAGNOSIS — Z803 Family history of malignant neoplasm of breast: Secondary | ICD-10-CM

## 2013-09-15 DIAGNOSIS — Z800 Family history of malignant neoplasm of digestive organs: Secondary | ICD-10-CM

## 2013-09-15 DIAGNOSIS — C50419 Malignant neoplasm of upper-outer quadrant of unspecified female breast: Secondary | ICD-10-CM

## 2013-09-15 DIAGNOSIS — D0510 Intraductal carcinoma in situ of unspecified breast: Secondary | ICD-10-CM

## 2013-09-15 NOTE — Telephone Encounter (Signed)
Per staff message and POF I have scheduled appts. Advised scheduler of appts. JMW  

## 2013-09-15 NOTE — Progress Notes (Signed)
Jennifer Frazier  [phone] Fax:([phone]) [phone]     ID: Jennifer Frazier DOB: 1975/11/05  MR#: [id number]  YPP#:[id number]  Patient Care Team: Sharilyn Sites, MD as PCP - General (Family Medicine) Chauncey Cruel, MD as Consulting Physician (Oncology) Thea Silversmith, MD as Consulting Physician (Radiation Oncology) Joyice Faster. Cornett, MD as Consulting Physician (General Surgery)   CHIEF COMPLAINT: Estrogen receptor positive stage I breast cancer  CURRENT TREATMENT: To start systemic adjuvant chemotherapy   BREAST CANCER HISTORY: From the original intake note:  The patient had screening mammography at [facility] showing calcifications in the right breast, and was referred to the breast Center 07/06/2013 for additional views. Right diagnostic mammography confirmed a group of amorphous calcifications in the upper outer right breast measuring 1.2 cm.  Biopsy of this area 07/17/2013 showed ([id number]) ductal carcinoma in situ, high-grade, estrogen receptor 100% positive, and progesterone receptor 96% positive, both with strong staining intensity.  The patient was then referred to surgery and on 07/25/2013 underwent bilateral breast MRI. This showed a breast density category CEA. In the right breast there was an area of clumped nodular enhancement measuring 3 cm. There was also a 1.5 cm circumscribed oval mass in the medial portion of the right breast consistent with a fibroadenoma. The left breast was unremarkable, and there were no abnormal appearing lymph nodes.  On 08/08/2013 the patient underwent right lumpectomy and sentinel lymph node sampling. The pathology ([id number] was (showed, in addition to ductal carcinoma in situ, invasive ductal carcinoma, measuring 0.7 cm, grade 3, estrogen receptor 86% positive, progesterone receptor 89% positive, both with strong staining intensity, with an MIB-1 of 62%, and no HER-2 amplification. Both sentinel lymph nodes  were clear. Margins were ample.   The patient's subsequent history is as detailed below  INTERVAL HISTORY: Jennifer Frazier returns today for followup of her breast cancer accompanied by her mother. Since her last visit here she underwent right lumpectomy and sentinel lymph node sampling (08/08/2013, [id number]) showing a 0.7 cm invasive ductal carcinoma, grade 3, with ample margins. Both sentinel lymph nodes were clear. The prognostic profile was repeated, the estrogen receptor being 86% positive, progesterone receptor 89% positive, both with strong staining intensity, the MIB-1 was 62%, and HER-2 was again negative, with this signals ratio of 1.07 and the number per cell 1.60.  The patient also had an Oncotype since from the lumpectomy sample. This showed a score of 23, predicting a 15% chance of recurrence outside the breast within the next 10 years if the patient's only systemic treatment is tamoxifen for 5 years.  REVIEW OF SYSTEMS: [name] has done well with her surgery. She is concerned that her scar is slightly raised secondary to keloids and wonders if that will resolve on its own or if it requires some intervention. She describes herself as mildly fatigued. She has occasional headaches. She feels anxious, but not depressed. She tells me her periods are now pretty irregular, and scant. She is having occasional mild hot flashes. A detailed review of systems today was otherwise stable  PAST MEDICAL HISTORY: Past Medical History  Diagnosis Date  . Fibroids     uterine fibroids  . Anemia   . Thyroid disease   . Contact lens/glasses fitting     wears contacts or glasses  . Sleep apnea     has a cpap-does not always use it    PAST SURGICAL HISTORY: Past Surgical History  Procedure Laterality Date  . [place]  2012    urerine ablasion  . Eye surgery  2013    retinal tear-lazer-both  . Wisdom tooth extraction    . Breast surgery      lumpectomy 7/14    FAMILY HISTORY Family History  Problem  Relation Age of Onset  . Cancer Father 20    colon  . Cancer Maternal Aunt 50    breast cancer  . Cancer Paternal Aunt 73    breast cancer  . Cancer Maternal Grandmother 21    ovarian or uterine cancer  . Cancer Paternal Grandmother     unknown primary   the patient's father died at the age of 15, been diagnosed with colon cancer at the age of 59. The patient's mother is living, currently 12 years old. The patient has one brother, no sisters. One of the patient's mother's sisters was diagnosed with breast cancer at the age of 76 in one of the patient's father's mother was diagnosed with breast cancer at the age of 61. There is no history of ovarian cancer in the family.  GYNECOLOGIC HISTORY:  No LMP recorded. Menarche age 41, for the patient is GX P0. She still having regular periods. 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 She is single and her mother lives with her.(The patient's mother is wheelchair-bound secondary to a stroke).    ADVANCED DIRECTIVES: Not in place; at the 08/24/2013 visit the patient was given the appropriate documents to come feel notarize associate may declare healthcare power of attorney.   HEALTH MAINTENANCE: History  Substance Use Topics  . Smoking status: Never Smoker   . Smokeless tobacco: Not on file  . Alcohol Use: No     Colonoscopy:  PAP:  Bone density:  Lipid panel:  Allergies  Allergen Reactions  . Gadolinium      Desc: NAUSEA WITH MRI CONTRAST-NO VOMITING   . Tramadol Hives and Nausea Only    Current Outpatient Prescriptions  Medication Sig Dispense Refill  . Barberry-Oreg Grape-Goldenseal (BERBERINE COMPLEX PO) Take 500 mg by mouth 3 (three) times daily.      . Cholecalciferol (VITAMIN D3) 3000 UNITS TABS Take 10,000 Units by mouth daily.      [name] ibuprofen (ADVIL,MOTRIN) 100 MG/5ML suspension Take 200 mg by  mouth every 4 (four) hours as needed.      . magnesium gluconate (MAGONATE) 500 MG tablet Take 400 mg by mouth Nightly.      . Omega-3 Fatty Acids (FISH OIL CONCENTRATE PO) Take by mouth.      . progesterone 200 MG SUPP Place 200 mg vaginally at bedtime.      . promethazine (PHENERGAN) 25 MG tablet Take 25 mg by mouth every 6 (six) hours as needed for nausea or vomiting.      . thyroid (ARMOUR) 130 MG tablet Take 130 mg by mouth daily.      [name] UNABLE TO FIND Take 1 tablet by mouth daily. DIM - per pt - obtain supplement [facility]- used to " get rid of extra estrogen in the body "      . vitamin B-12 (CYANOCOBALAMIN) 1000 MCG tablet Take 5,000 mcg by mouth daily.       No current facility-administered medications for this visit.    OBJECTIVE: Middle-aged [place] American woman in no acute distress There were no vitals filed for this visit.   There is no weight on  file to calculate BMI.    ECOG FS:1 - Symptomatic but completely ambulatory Vitals - 1 value per visit 1/69/4503  SYSTOLIC 888  DIASTOLIC 280  Pulse 73  Temperature 98.2  Respirations   Weight (lb) 222.25  Height '5\' 9"'   BMI 32.81  VISIT REPORT    Sclerae unicteric, pupils equal and reactive Oropharynx clear and moist No cervical or supraclavicular adenopathy Lungs no rales or rhonchi Heart regular rate and rhythm Abd soft, obese, nontender, positive bowel sounds MSK no focal spinal tenderness, no right upper extremity lymphedema Neuro: nonfocal, well oriented, appropriate affect Breasts: The right breast is status post recent surgery. The cosmetic result is good. The incisions are healing nicely, with mild to moderate keloid formation, but no dehiscence, swelling, or erythema. The right axilla is benign. Left breast is unremarkable  LAB RESULTS:  CMP     Component Value Date/Time   NA 139 08/24/2013 1229   NA 139 08/04/2013 1245   K 3.9 08/24/2013 1229   K 4.4 08/04/2013 1245   CL 103 08/04/2013 1245   CO2 22  08/24/2013 1229   CO2 23 08/04/2013 1245   GLUCOSE 79 08/24/2013 1229   GLUCOSE 76 08/04/2013 1245   BUN 17.3 08/24/2013 1229   BUN 20 08/04/2013 1245   CREATININE 0.9 08/24/2013 1229   CREATININE 0.68 08/04/2013 1245   CALCIUM 9.9 08/24/2013 1229   CALCIUM 10.0 08/04/2013 1245   PROT 7.8 08/24/2013 1229   PROT 7.4 08/04/2013 1245   ALBUMIN 3.7 08/24/2013 1229   ALBUMIN 4.0 08/04/2013 1245   AST 14 08/24/2013 1229   AST 15 08/04/2013 1245   ALT 9 08/24/2013 1229   ALT 12 08/04/2013 1245   ALKPHOS 89 08/24/2013 1229   ALKPHOS 106 08/04/2013 1245   BILITOT 0.37 08/24/2013 1229   BILITOT 0.2* 08/04/2013 1245   GFRNONAA >90 08/04/2013 1245   GFRAA >90 08/04/2013 1245    I No results found for this basename: SPEP,  UPEP,   kappa and lambda light chains    Lab Results  Component Value Date   WBC 5.9 08/24/2013   NEUTROABS 4.1 08/24/2013   HGB 13.9 08/24/2013   HCT 43.3 08/24/2013   MCV 84.3 08/24/2013   PLT 275 08/24/2013      Chemistry      Component Value Date/Time   NA 139 08/24/2013 1229   NA 139 08/04/2013 1245   K 3.9 08/24/2013 1229   K 4.4 08/04/2013 1245   CL 103 08/04/2013 1245   CO2 22 08/24/2013 1229   CO2 23 08/04/2013 1245   BUN 17.3 08/24/2013 1229   BUN 20 08/04/2013 1245   CREATININE 0.9 08/24/2013 1229   CREATININE 0.68 08/04/2013 1245      Component Value Date/Time   CALCIUM 9.9 08/24/2013 1229   CALCIUM 10.0 08/04/2013 1245   ALKPHOS 89 08/24/2013 1229   ALKPHOS 106 08/04/2013 1245   AST 14 08/24/2013 1229   AST 15 08/04/2013 1245   ALT 9 08/24/2013 1229   ALT 12 08/04/2013 1245   BILITOT 0.37 08/24/2013 1229   BILITOT 0.2* 08/04/2013 1245       No results found for this basename: LABCA2    No components found with this basename: LABCA125    No results found for this basename: INR,  in the last 168 hours  Urinalysis    Component Value Date/Time   COLORURINE YELLOW 10/21/2009 1440   APPEARANCEUR CLEAR 10/21/2009 1440   LABSPEC 1.012 10/21/2009 1440  PHURINE 6.0 10/21/2009  1440   GLUCOSEU NEGATIVE 10/21/2009 1440   HGBUR NEGATIVE 10/21/2009 1440   BILIRUBINUR NEGATIVE 10/21/2009 1440   KETONESUR 15* 10/21/2009 1440   PROTEINUR NEGATIVE 10/21/2009 1440   UROBILINOGEN 0.2 10/21/2009 1440   NITRITE NEGATIVE 10/21/2009 1440   LEUKOCYTESUR NEGATIVE MICROSCOPIC NOT DONE ON URINES WITH NEGATIVE PROTEIN, BLOOD, LEUKOCYTES, NITRITE, OR GLUCOSE <1000 mg/dL. 10/21/2009 1440    STUDIES: No results found.   ASSESSMENT: 38 y.o. BRCA negative  woman status post right breast biopsy 07/17/2013 for high-grade ductal carcinoma in situ, estrogen and progesterone receptor positive  (1) status post right lumpectomy and sentinel lymph node sampling 08/08/2013 for a pT1b pN0, stage IA invasive ductal carcinoma, grade 3, estrogen and progesterone receptor positive, with an MIB-1 of 62% and no HER-2 amplification  (2) genetics testing sent 08/02/2013 was normal but did identify a variant of uncertain significance called BRCA2, [id number].  (3) Oncotype score of 23 predicts a risk of outside the breast recurrence within 10 years of 15% if the patient's only systemic treatment is tamoxifen for 5 years. Adjuvant chemotherapy was recommended  (4) doxorubicin and cyclophosphamide in dose dense fashion x4 with Neulasta support to start 10/05/2013, to be followed by paclitaxel weekly x12  (5) adjuvant radiation to follow chemotherapy  (6) anti-estrogens for 5-10 years to follow radiation  PLAN: We spent approximately 1 hour going over Arryanna's situation today. She understands chemotherapy generally reduces risk by about one third, and therefore in a, based on the Oncotype results, this would bring her risk down from 15% to 10%. We might be able to do a little better if instead of using tamoxifen for 5 years when we get to the antiestrogen therapy we do in either tamoxifen for 10 years or some combination of tamoxifen and aromatase inhibitors. She understands that recurrence outside of the  breast mean stage IV disease and that that would be incurable.  Even though some patients might consider a 5% risk reduction marginal, and that would not be unreasonable, nevertheless our standard of care in [place] is to recommend chemotherapy if the benefit is expected to be 5% or greater. Certainly NA 38 year old woman more is likely to be better. All this was discussed extensively with Zanae and she has a good understanding of the possible toxicities, side effects and complications of chemotherapy including concerns regarding weakening the heart muscle, peripheral neuropathy, and temporary compromise of her immune system.  Given all this, Haya is very clear in her mind that she wants to do everything possible to reduce her risk of distant recurrence. She understands chemotherapy may well make her permanently infertile. We have given her information regarding fertility preservation and she will followup on this at her discretion. Also she is concerned regarding self image issues. This shows a good deal of inside in this young woman. Today we wrote her prescriptions for cranial prostheses and offered the patient counseling, which she declines at present.  She will need an echocardiogram and a port and this is being set up. Our targets start date is 10/05/2013, and she will come to chemotherapy school prior to that date. She will see [name] again 10/03/2013 and at that visit we will discuss antinausea and other supportive meds, give her a "map" on how to take them, and also place all the relevant prescriptions.  The patient has a good understanding of the overall plan. She agrees with it. She knows the goal of treatment in her case is cure. She will  call with any problems that may develop before her next visit here.  Chauncey Cruel, MD   09/16/2013 9:05 AM

## 2013-09-18 ENCOUNTER — Other Ambulatory Visit (INDEPENDENT_AMBULATORY_CARE_PROVIDER_SITE_OTHER): Payer: Self-pay | Admitting: Surgery

## 2013-09-19 ENCOUNTER — Telehealth: Payer: Self-pay | Admitting: *Deleted

## 2013-09-19 ENCOUNTER — Encounter: Payer: Self-pay | Admitting: *Deleted

## 2013-09-19 ENCOUNTER — Other Ambulatory Visit: Payer: 59

## 2013-09-20 ENCOUNTER — Telehealth: Payer: Self-pay | Admitting: Oncology

## 2013-09-20 DIAGNOSIS — N83202 Unspecified ovarian cyst, left side: Secondary | ICD-10-CM | POA: Insufficient documentation

## 2013-09-22 ENCOUNTER — Telehealth: Payer: Self-pay | Admitting: Oncology

## 2013-09-22 NOTE — Telephone Encounter (Signed)
Error

## 2013-09-22 NOTE — Telephone Encounter (Signed)
s/w pt are appt for echo 9/1 @ [facility]. also confirmed next appt for 9/8 @ 1:45pm and pt will get new schedule when she comes in. pre auth for echo #[id number].

## 2013-09-25 ENCOUNTER — Telehealth: Payer: Self-pay | Admitting: *Deleted

## 2013-09-25 NOTE — Telephone Encounter (Signed)
Received cal from patient stating she may be interested in a second opinion.  She also requests to see Dr. Jana Hakim tomorrow if possible. She has a lot of questions that she would like to discuss with Dr. Jana Hakim.  Confirmed appointment with Dr. Jana Hakim 09/26/13 at 2pm.  Informed her she could also get his thoughts of who to see for a second opinion and we could make that referral.  She verbalized understanding.

## 2013-09-26 ENCOUNTER — Ambulatory Visit (HOSPITAL_COMMUNITY)
Admission: RE | Admit: 2013-09-26 | Discharge: 2013-09-26 | Disposition: A | Discharge status: Home / Self Care | Payer: 59 | Source: Ambulatory Visit | Attending: Oncology | Admitting: Oncology

## 2013-09-26 ENCOUNTER — Telehealth: Payer: Self-pay | Admitting: Oncology

## 2013-09-26 ENCOUNTER — Encounter (HOSPITAL_BASED_OUTPATIENT_CLINIC_OR_DEPARTMENT_OTHER): Payer: Self-pay | Admitting: *Deleted

## 2013-09-26 ENCOUNTER — Ambulatory Visit (HOSPITAL_BASED_OUTPATIENT_CLINIC_OR_DEPARTMENT_OTHER): Payer: 59 | Admitting: Oncology

## 2013-09-26 VITALS — BP 149/83 | HR 63 | Temp 98.0°F | Resp 18 | Ht 69.0 in | Wt 222.4 lb

## 2013-09-26 DIAGNOSIS — I517 Cardiomegaly: Secondary | ICD-10-CM

## 2013-09-26 DIAGNOSIS — D0590 Unspecified type of carcinoma in situ of unspecified breast: Secondary | ICD-10-CM | POA: Diagnosis not present

## 2013-09-26 DIAGNOSIS — D0510 Intraductal carcinoma in situ of unspecified breast: Secondary | ICD-10-CM

## 2013-09-26 DIAGNOSIS — C50419 Malignant neoplasm of upper-outer quadrant of unspecified female breast: Secondary | ICD-10-CM | POA: Diagnosis not present

## 2013-09-26 DIAGNOSIS — Z01818 Encounter for other preprocedural examination: Secondary | ICD-10-CM | POA: Insufficient documentation

## 2013-09-26 DIAGNOSIS — Z170 Estrogen receptor positive status [ER+]: Secondary | ICD-10-CM

## 2013-09-26 DIAGNOSIS — C50919 Malignant neoplasm of unspecified site of unspecified female breast: Secondary | ICD-10-CM

## 2013-09-26 DIAGNOSIS — C50411 Malignant neoplasm of upper-outer quadrant of right female breast: Secondary | ICD-10-CM

## 2013-09-26 NOTE — Telephone Encounter (Signed)
per pof to CX pt appts-per pt she decided that enroute to sch"after using the bathroom" she decided to move forward with trmts and ALL sch appts as is-Adv Val that pt wanted ALL appts to stay-gave pt copy of sch

## 2013-09-26 NOTE — Progress Notes (Signed)
Jennifer Frazier  [phone] [phone]     ID: Jennifer Frazier DOB: 12/03/1975  MR#: [id number]  TGY#:[id number]  Patient Care Team: Sharilyn Sites, MD as PCP - General (Family Medicine) Chauncey Cruel, MD as Consulting Physician (Oncology) Thea Silversmith, MD as Consulting Physician (Radiation Oncology) Joyice Faster. Cornett, MD as Consulting Physician (General Surgery)   CHIEF COMPLAINT: Estrogen receptor positive stage I breast cancer  CURRENT TREATMENT: To start systemic adjuvant chemotherapy   BREAST CANCER HISTORY: From the original intake note:  The patient had screening mammography at [facility] showing calcifications in the right breast, and was referred to the breast Center 07/06/2013 for additional views. Right diagnostic mammography confirmed a group of amorphous calcifications in the upper outer right breast measuring 1.2 cm.  Biopsy of this area 07/17/2013 showed ([id number]) ductal carcinoma in situ, high-grade, estrogen receptor 100% positive, and progesterone receptor 96% positive, both with strong staining intensity.  The patient was then referred to surgery and on 07/25/2013 underwent bilateral breast MRI. This showed a breast density category CEA. In the right breast there was an area of clumped nodular enhancement measuring 3 cm. There was also a 1.5 cm circumscribed oval mass in the medial portion of the right breast consistent with a fibroadenoma. The left breast was unremarkable, and there were no abnormal appearing lymph nodes.  On 08/08/2013 the patient underwent right lumpectomy and sentinel lymph node sampling. The pathology ([id number] was (showed, in addition to ductal carcinoma in situ, invasive ductal carcinoma, measuring 0.7 cm, grade 3, estrogen receptor 86% positive, progesterone receptor 89% positive, both with strong staining intensity, with an MIB-1 of 62%, and no HER-2 amplification. Both sentinel lymph nodes  were clear. Margins were ample.   The patient's subsequent history is as detailed below  INTERVAL HISTORY: Jennifer Frazier returns today for followup of her breast cancer accompanied by her mother. After the visit here she has become very anxious and worried. She had many questions today. She was given information on fertility preservation at the last visit, and she understands she may become permanently infertile through chemotherapy, but at this point she is "accepting that". She feels very insecure, she tells me that it is very difficult for her to make decisions, and she wondered if she should not have a second opinion before starting her treatments  REVIEW OF SYSTEMS: A detailed review of systems today was negative except for concerns regarding mild fatigue, insomnia, anxiety, and depression.  PAST MEDICAL HISTORY: Past Medical History  Diagnosis Date  . Fibroids     uterine fibroids  . Anemia   . Thyroid disease   . Contact lens/glasses fitting     wears contacts or glasses  . Sleep apnea     has a cpap-does not always use it    PAST SURGICAL HISTORY: Past Surgical History  Procedure Laterality Date  . [place]  2012    urerine ablasion  . Eye surgery  2013    retinal tear-lazer-both  . Wisdom tooth extraction    . Breast surgery      lumpectomy 7/14    FAMILY HISTORY Family History  Problem Relation Age of Onset  . Cancer Father 55    colon  . Cancer Maternal Aunt 50    breast cancer  . Cancer Paternal Aunt 81    breast cancer  . Cancer Maternal Grandmother 21    ovarian or uterine cancer  . Cancer Paternal Grandmother  unknown primary   the patient's father died at the age of 65, been diagnosed with colon cancer at the age of 81. The patient's mother is living, currently 80 years old. The patient has one brother, no sisters. One of the patient's mother's sisters was diagnosed with breast cancer at the age of 69 in one of the patient's father's mother was diagnosed with  breast cancer at the age of 19. There is no history of ovarian cancer in the family.  GYNECOLOGIC HISTORY:  No LMP recorded. Menarche age 63, for the patient is GX P0. She still having regular periods. 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 She is single and her mother lives with her.(The patient's mother is wheelchair-bound secondary to a stroke).    ADVANCED DIRECTIVES: Not in place; at the 08/24/2013 visit the patient was given the appropriate documents to come feel notarize associate may declare healthcare power of attorney.   HEALTH MAINTENANCE: History  Substance Use Topics  . Smoking status: Never Smoker   . Smokeless tobacco: Not on file  . Alcohol Use: No     Colonoscopy:  PAP:  Bone density:  Lipid panel:  Allergies  Allergen Reactions  . Gadolinium      Desc: NAUSEA WITH MRI CONTRAST-NO VOMITING   . Tramadol Hives and Nausea Only    Current Outpatient Prescriptions  Medication Sig Dispense Refill  . Barberry-Oreg Grape-Goldenseal (BERBERINE COMPLEX PO) Take 500 mg by mouth 3 (three) times daily.      . Cholecalciferol (VITAMIN D3) 3000 UNITS TABS Take 10,000 Units by mouth daily.      [name] ibuprofen (ADVIL,MOTRIN) 100 MG/5ML suspension Take 200 mg by mouth every 4 (four) hours as needed.      . magnesium gluconate (MAGONATE) 500 MG tablet Take 400 mg by mouth Nightly.      . Omega-3 Fatty Acids (FISH OIL CONCENTRATE PO) Take by mouth.      . progesterone 200 MG SUPP Place 200 mg vaginally at bedtime.      . promethazine (PHENERGAN) 25 MG tablet Take 25 mg by mouth every 6 (six) hours as needed for nausea or vomiting.      . thyroid (ARMOUR) 130 MG tablet Take 130 mg by mouth daily.      [name] UNABLE TO FIND Take 1 tablet by mouth daily. DIM - per pt - obtain supplement [facility]- used to " get rid of extra estrogen in the body "        . vitamin B-12 (CYANOCOBALAMIN) 1000 MCG tablet Take 5,000 mcg by mouth daily.       No current facility-administered medications for this visit.    OBJECTIVE: Middle-aged [place] American woman who appears stated age 38 Vitals:   09/26/13 1409  BP: 149/83  Pulse: 63  Temp: 98 F (36.7 C)  Resp: 18     Body mass index is 32.83 kg/(m^2).    ECOG FS:1 - Symptomatic but completely ambulatory  Sclerae unicteric, EOMs intact Oropharynx clear and moist No cervical or supraclavicular adenopathy Lungs no rales or rhonchi Heart regular rate and rhythm Abd soft, obese, nontender, positive bowel sounds MSK no focal spinal tenderness, no right upper extremity lymphedema Neuro: nonfocal, well oriented, anxious affect Breasts: Deferred  LAB RESULTS:  CMP     Component Value Date/Time   NA 139 08/24/2013 1229   NA 139 08/04/2013 1245  K 3.9 08/24/2013 1229   K 4.4 08/04/2013 1245   CL 103 08/04/2013 1245   CO2 22 08/24/2013 1229   CO2 23 08/04/2013 1245   GLUCOSE 79 08/24/2013 1229   GLUCOSE 76 08/04/2013 1245   BUN 17.3 08/24/2013 1229   BUN 20 08/04/2013 1245   CREATININE 0.9 08/24/2013 1229   CREATININE 0.68 08/04/2013 1245   CALCIUM 9.9 08/24/2013 1229   CALCIUM 10.0 08/04/2013 1245   PROT 7.8 08/24/2013 1229   PROT 7.4 08/04/2013 1245   ALBUMIN 3.7 08/24/2013 1229   ALBUMIN 4.0 08/04/2013 1245   AST 14 08/24/2013 1229   AST 15 08/04/2013 1245   ALT 9 08/24/2013 1229   ALT 12 08/04/2013 1245   ALKPHOS 89 08/24/2013 1229   ALKPHOS 106 08/04/2013 1245   BILITOT 0.37 08/24/2013 1229   BILITOT 0.2* 08/04/2013 1245   GFRNONAA >90 08/04/2013 1245   GFRAA >90 08/04/2013 1245    I No results found for this basename: SPEP,  UPEP,   kappa and lambda light chains    Lab Results  Component Value Date   WBC 5.9 08/24/2013   NEUTROABS 4.1 08/24/2013   HGB 13.9 08/24/2013   HCT 43.3 08/24/2013   MCV 84.3 08/24/2013   PLT 275 08/24/2013      Chemistry      Component Value Date/Time   NA 139  08/24/2013 1229   NA 139 08/04/2013 1245   K 3.9 08/24/2013 1229   K 4.4 08/04/2013 1245   CL 103 08/04/2013 1245   CO2 22 08/24/2013 1229   CO2 23 08/04/2013 1245   BUN 17.3 08/24/2013 1229   BUN 20 08/04/2013 1245   CREATININE 0.9 08/24/2013 1229   CREATININE 0.68 08/04/2013 1245      Component Value Date/Time   CALCIUM 9.9 08/24/2013 1229   CALCIUM 10.0 08/04/2013 1245   ALKPHOS 89 08/24/2013 1229   ALKPHOS 106 08/04/2013 1245   AST 14 08/24/2013 1229   AST 15 08/04/2013 1245   ALT 9 08/24/2013 1229   ALT 12 08/04/2013 1245   BILITOT 0.37 08/24/2013 1229   BILITOT 0.2* 08/04/2013 1245       No results found for this basename: LABCA2    No components found with this basename: LABCA125    No results found for this basename: INR,  in the last 168 hours  Urinalysis    Component Value Date/Time   COLORURINE YELLOW 10/21/2009 1440   APPEARANCEUR CLEAR 10/21/2009 1440   LABSPEC 1.012 10/21/2009 1440   PHURINE 6.0 10/21/2009 1440   GLUCOSEU NEGATIVE 10/21/2009 1440   HGBUR NEGATIVE 10/21/2009 1440   BILIRUBINUR NEGATIVE 10/21/2009 1440   KETONESUR 15* 10/21/2009 1440   PROTEINUR NEGATIVE 10/21/2009 1440   UROBILINOGEN 0.2 10/21/2009 1440   NITRITE NEGATIVE 10/21/2009 1440   LEUKOCYTESUR NEGATIVE MICROSCOPIC NOT DONE ON URINES WITH NEGATIVE PROTEIN, BLOOD, LEUKOCYTES, NITRITE, OR GLUCOSE <1000 mg/dL. 10/21/2009 1440    STUDIES: No new results found.   ASSESSMENT: 38 y.o. BRCA negative [place] woman status post right breast biopsy 07/17/2013 for high-grade ductal carcinoma in situ, estrogen and progesterone receptor positive  (1) status post right lumpectomy and sentinel lymph node sampling 08/08/2013 for a pT1b pN0, stage IA invasive ductal carcinoma, grade 3, estrogen and progesterone receptor positive, with an MIB-1 of 62% and no HER-2 amplification  (2) genetics sent 08/02/2013 was normal but did identify a variant of uncertain significance called BRCA2, [id number].  (3) Oncotype score of  23 predicts a  risk of outside the breast recurrence within 10 years of 15% if the patient's only systemic treatment is tamoxifen for 5 years. Adjuvant chemotherapy was recommended  (4) doxorubicin and cyclophosphamide in dose dense fashion x4 with Neulasta support to start 10/05/2013, to be followed by paclitaxel weekly x12  (5) adjuvant radiation to follow chemotherapy  (6) anti-estrogens for 5-10 years to follow radiation  PLAN: We spent approximately one hour today reviewing when the situation. She had many questions regarding her treatment plan. She had thought that her cancer had become invasive between the time of the initial diagnosis of ductal carcinoma in situ in her lumpectomy. We cleared that misunderstanding. She was afraid she might be allergic to some other cancer medications. But of course as a possibility, especially with carboplatin, but it is unusual and we do skin testing as per the usual protocol.  She is terrified that she may have cancer in other parts of her body, and of course she is at risk of having microscopic spread of disease elsewhere, which is the reason we recommend systemic therapy. She understands that we generally do not recommend extensive staging studies except for stage III patients because of the risk of false positives and radiation exposure. For her however it would be an enormous relief to have some scans that do not show stage IV disease. I think this is reasonable and will help her and I am writing the orders. I cautioned her that a negative PET scan does not mean she does not have microscopic disease. She understands that.  She requested a second opinion, and I reassured her that that is perfectly legitimate. She wished to postpone her treatment and I went ahead and wrote the appropriate orders. She has been given information on fertility preservation, but she has not followed up on that and has accepted the fact that she very likely will not have children.  If she overdoes [facility] she plans to adopt.  ADDENDUM: I placed a referral to Dr. Philipp Ovens at [facility] for a second opinion and canceled the patient's treatment orders. However later on the same day the patient called to tell me that she had changed her mind, decided she really did need to chemotherapy, did not want to delay starting therapy, and did not want a second opinion. Accordingly we are back to the original plan. She will see [name] again 10/03/2013 to go over her nausea and other supportive medications, and she will start her chemotherapy 10/05/2013.    Chauncey Cruel, MD   09/26/2013 2:31 PM

## 2013-09-26 NOTE — Progress Notes (Signed)
  Echocardiogram 2D Echocardiogram has been performed.  Jennifer Frazier 09/26/2013, 12:38 PM

## 2013-09-28 ENCOUNTER — Telehealth: Payer: Self-pay | Admitting: Oncology

## 2013-09-28 NOTE — Telephone Encounter (Signed)
per pof to CX inj on 9/9-appt CX

## 2013-09-29 ENCOUNTER — Encounter (HOSPITAL_BASED_OUTPATIENT_CLINIC_OR_DEPARTMENT_OTHER)
Admission: RE | Disposition: A | Discharge status: Home / Self Care | Payer: Self-pay | Source: Ambulatory Visit | Attending: Surgery

## 2013-09-29 ENCOUNTER — Encounter (HOSPITAL_BASED_OUTPATIENT_CLINIC_OR_DEPARTMENT_OTHER): Payer: 59 | Admitting: Anesthesiology

## 2013-09-29 ENCOUNTER — Ambulatory Visit (HOSPITAL_BASED_OUTPATIENT_CLINIC_OR_DEPARTMENT_OTHER)
Admission: RE | Admit: 2013-09-29 | Discharge: 2013-09-29 | Disposition: A | Discharge status: Home / Self Care | Payer: 59 | Source: Ambulatory Visit | Attending: Surgery | Admitting: Surgery

## 2013-09-29 ENCOUNTER — Ambulatory Visit (HOSPITAL_BASED_OUTPATIENT_CLINIC_OR_DEPARTMENT_OTHER): Payer: 59 | Admitting: Anesthesiology

## 2013-09-29 ENCOUNTER — Ambulatory Visit (HOSPITAL_COMMUNITY): Payer: 59

## 2013-09-29 ENCOUNTER — Encounter (HOSPITAL_BASED_OUTPATIENT_CLINIC_OR_DEPARTMENT_OTHER): Payer: Self-pay | Admitting: *Deleted

## 2013-09-29 DIAGNOSIS — C50411 Malignant neoplasm of upper-outer quadrant of right female breast: Secondary | ICD-10-CM

## 2013-09-29 DIAGNOSIS — C50919 Malignant neoplasm of unspecified site of unspecified female breast: Secondary | ICD-10-CM | POA: Insufficient documentation

## 2013-09-29 HISTORY — PX: PORTACATH PLACEMENT: SHX2246

## 2013-09-29 SURGERY — INSERTION, TUNNELED CENTRAL VENOUS DEVICE, WITH PORT
Anesthesia: General | Site: Neck | Laterality: Right

## 2013-09-29 MED ORDER — ONDANSETRON HCL 4 MG/2ML IJ SOLN: 4.0000 mg | Freq: Once | INTRAMUSCULAR | Status: DC | PRN

## 2013-09-29 MED ORDER — FENTANYL CITRATE 0.05 MG/ML IJ SOLN
INTRAMUSCULAR | Status: AC
  Filled 2013-09-29: qty 4

## 2013-09-29 MED ORDER — CEFAZOLIN SODIUM-DEXTROSE 2-3 GM-% IV SOLR
INTRAVENOUS | Status: AC
Start: 2013-09-29 — End: 2013-09-29
  Filled 2013-09-29: qty 50

## 2013-09-29 MED ORDER — HEPARIN (PORCINE) IN NACL 2-0.9 UNIT/ML-% IJ SOLN
INTRAMUSCULAR | Status: DC | PRN
  Administered 2013-09-29: 500 mL via INTRAVENOUS

## 2013-09-29 MED ORDER — CEFAZOLIN SODIUM-DEXTROSE 2-3 GM-% IV SOLR
2.0000 g | INTRAVENOUS | Status: AC
  Administered 2013-09-29: 2 g via INTRAVENOUS

## 2013-09-29 MED ORDER — LIDOCAINE HCL (CARDIAC) 20 MG/ML IV SOLN
INTRAVENOUS | Status: DC | PRN
  Administered 2013-09-29: 80 mg via INTRAVENOUS

## 2013-09-29 MED ORDER — BUPIVACAINE-EPINEPHRINE (PF) 0.25% -1:200000 IJ SOLN
INTRAMUSCULAR | Status: AC
Start: 2013-09-29 — End: 2013-09-29
  Filled 2013-09-29: qty 30

## 2013-09-29 MED ORDER — HEPARIN (PORCINE) IN NACL 2-0.9 UNIT/ML-% IJ SOLN
INTRAMUSCULAR | Status: AC
  Filled 2013-09-29: qty 500

## 2013-09-29 MED ORDER — DEXAMETHASONE SODIUM PHOSPHATE 4 MG/ML IJ SOLN
INTRAMUSCULAR | Status: DC | PRN
  Administered 2013-09-29: 10 mg via INTRAVENOUS

## 2013-09-29 MED ORDER — MIDAZOLAM HCL 2 MG/2ML IJ SOLN: 1.0000 mg | INTRAMUSCULAR | Status: DC | PRN

## 2013-09-29 MED ORDER — OXYCODONE HCL 5 MG/5ML PO SOLN: 5.0000 mg | Freq: Once | ORAL | Status: DC | PRN

## 2013-09-29 MED ORDER — MIDAZOLAM HCL 5 MG/5ML IJ SOLN
INTRAMUSCULAR | Status: DC | PRN
  Administered 2013-09-29: 2 mg via INTRAVENOUS

## 2013-09-29 MED ORDER — ONDANSETRON HCL 4 MG/2ML IJ SOLN
INTRAMUSCULAR | Status: DC | PRN
  Administered 2013-09-29: 4 mg via INTRAVENOUS

## 2013-09-29 MED ORDER — LACTATED RINGERS IV SOLN
INTRAVENOUS | Status: DC
  Administered 2013-09-29 (×2): via INTRAVENOUS

## 2013-09-29 MED ORDER — OXYCODONE HCL 5 MG PO TABS: 5.0000 mg | ORAL_TABLET | Freq: Once | ORAL | Status: DC | PRN

## 2013-09-29 MED ORDER — PROPOFOL 10 MG/ML IV BOLUS
INTRAVENOUS | Status: DC | PRN
  Administered 2013-09-29: 200 mg via INTRAVENOUS

## 2013-09-29 MED ORDER — FENTANYL CITRATE 0.05 MG/ML IJ SOLN
INTRAMUSCULAR | Status: DC | PRN
Start: 2013-09-29 — End: 2013-09-29
  Administered 2013-09-29: 100 ug via INTRAVENOUS

## 2013-09-29 MED ORDER — HEPARIN SOD (PORK) LOCK FLUSH 100 UNIT/ML IV SOLN
INTRAVENOUS | Status: AC
  Filled 2013-09-29: qty 5

## 2013-09-29 MED ORDER — MIDAZOLAM HCL 2 MG/2ML IJ SOLN
INTRAMUSCULAR | Status: AC
  Filled 2013-09-29: qty 2

## 2013-09-29 MED ORDER — HYDROMORPHONE HCL PF 1 MG/ML IJ SOLN: 0.2500 mg | INTRAMUSCULAR | Status: DC | PRN

## 2013-09-29 MED ORDER — BUPIVACAINE-EPINEPHRINE 0.25% -1:200000 IJ SOLN
INTRAMUSCULAR | Status: DC | PRN
  Administered 2013-09-29: 10 mL

## 2013-09-29 MED ORDER — FENTANYL CITRATE 0.05 MG/ML IJ SOLN: 50.0000 ug | INTRAMUSCULAR | Status: DC | PRN

## 2013-09-29 MED ORDER — OXYCODONE-ACETAMINOPHEN 5-325 MG PO TABS: 1.0000 | ORAL_TABLET | ORAL | Status: DC | PRN

## 2013-09-29 MED ORDER — HEPARIN SOD (PORK) LOCK FLUSH 100 UNIT/ML IV SOLN
INTRAVENOUS | Status: DC | PRN
  Administered 2013-09-29: 500 [IU] via INTRAVENOUS

## 2013-09-29 MED ORDER — EPHEDRINE SULFATE 50 MG/ML IJ SOLN
INTRAMUSCULAR | Status: DC | PRN
  Administered 2013-09-29: 10 mg via INTRAVENOUS

## 2013-09-29 SURGICAL SUPPLY — 58 items
ADH SKN CLS APL DERMABOND .7 (GAUZE/BANDAGES/DRESSINGS) ×1
APL SKNCLS STERI-STRIP NONHPOA (GAUZE/BANDAGES/DRESSINGS)
BAG DECANTER FOR FLEXI CONT (MISCELLANEOUS) ×2 IMPLANT
BENZOIN TINCTURE PRP APPL 2/3 (GAUZE/BANDAGES/DRESSINGS) IMPLANT
BLADE SURG 11 STRL SS (BLADE) ×2 IMPLANT
BLADE SURG 15 STRL LF DISP TIS (BLADE) ×1 IMPLANT
BLADE SURG 15 STRL SS (BLADE) ×2
CANISTER SUCT 1200ML W/VALVE (MISCELLANEOUS) IMPLANT
CHLORAPREP W/TINT 26ML (MISCELLANEOUS) ×4 IMPLANT
CLEANER CAUTERY TIP 5X5 PAD (MISCELLANEOUS) ×1 IMPLANT
COVER MAYO STAND STRL (DRAPES) ×2 IMPLANT
COVER PROBE 5X48 (MISCELLANEOUS) ×2
COVER TABLE BACK 60X90 (DRAPES) ×2 IMPLANT
DECANTER SPIKE VIAL GLASS SM (MISCELLANEOUS) IMPLANT
DERMABOND ADVANCED (GAUZE/BANDAGES/DRESSINGS) ×1
DERMABOND ADVANCED .7 DNX12 (GAUZE/BANDAGES/DRESSINGS) ×1 IMPLANT
DRAPE C-ARM 42X72 X-RAY (DRAPES) ×2 IMPLANT
DRAPE LAPAROSCOPIC ABDOMINAL (DRAPES) ×2 IMPLANT
DRAPE UTILITY XL STRL (DRAPES) ×2 IMPLANT
DRSG TEGADERM 2-3/8X2-3/4 SM (GAUZE/BANDAGES/DRESSINGS) IMPLANT
ELECT REM PT RETURN 9FT ADLT (ELECTROSURGICAL) ×2
ELECTRODE REM PT RTRN 9FT ADLT (ELECTROSURGICAL) ×1 IMPLANT
GLOVE BIO SURGEON STRL SZ7 (GLOVE) ×2 IMPLANT
GLOVE BIOGEL PI IND STRL 8 (GLOVE) ×1 IMPLANT
GLOVE BIOGEL PI INDICATOR 8 (GLOVE) ×1
GLOVE ECLIPSE 8.0 STRL XLNG CF (GLOVE) ×2 IMPLANT
GOWN STRL REUS W/ TWL LRG LVL3 (GOWN DISPOSABLE) ×2 IMPLANT
GOWN STRL REUS W/TWL LRG LVL3 (GOWN DISPOSABLE) ×4
IV KIT MINILOC 20X1 SAFETY (NEEDLE) IMPLANT
KIT CVR 48X5XPRB PLUP LF (MISCELLANEOUS) ×1 IMPLANT
KIT PORT POWER 8FR ISP CVUE (Catheter) ×2 IMPLANT
NDL SAFETY ECLIPSE 18X1.5 (NEEDLE) IMPLANT
NEEDLE HYPO 18GX1.5 SHARP (NEEDLE)
NEEDLE HYPO 22GX1.5 SAFETY (NEEDLE) IMPLANT
NEEDLE HYPO 25X1 1.5 SAFETY (NEEDLE) ×2 IMPLANT
NEEDLE SPNL 22GX3.5 QUINCKE BK (NEEDLE) IMPLANT
PACK BASIN DAY SURGERY FS (CUSTOM PROCEDURE TRAY) ×2 IMPLANT
PAD CLEANER CAUTERY TIP 5X5 (MISCELLANEOUS) ×1
PENCIL BUTTON HOLSTER BLD 10FT (ELECTRODE) ×2 IMPLANT
SET SHEATH INTRODUCER 10FR (MISCELLANEOUS) IMPLANT
SHEATH COOK PEEL AWAY SET 9F (SHEATH) IMPLANT
SLEEVE SCD COMPRESS KNEE MED (MISCELLANEOUS) ×2 IMPLANT
SPONGE GAUZE 4X4 12PLY STER LF (GAUZE/BANDAGES/DRESSINGS) IMPLANT
SPONGE LAP 4X18 X RAY DECT (DISPOSABLE) ×2 IMPLANT
STRIP CLOSURE SKIN 1/2X4 (GAUZE/BANDAGES/DRESSINGS) IMPLANT
SUT MON AB 4-0 PC3 18 (SUTURE) ×2 IMPLANT
SUT PROLENE 2 0 CT2 30 (SUTURE) IMPLANT
SUT PROLENE 2 0 SH DA (SUTURE) ×2 IMPLANT
SUT SILK 2 0 TIES 17X18 (SUTURE)
SUT SILK 2-0 18XBRD TIE BLK (SUTURE) IMPLANT
SUT VIC AB 3-0 SH 27 (SUTURE) ×2
SUT VIC AB 3-0 SH 27X BRD (SUTURE) ×1 IMPLANT
SYR 5ML LUER SLIP (SYRINGE) ×2 IMPLANT
SYR CONTROL 10ML LL (SYRINGE) ×2 IMPLANT
TOWEL OR 17X24 6PK STRL BLUE (TOWEL DISPOSABLE) ×2 IMPLANT
TOWEL OR NON WOVEN STRL DISP B (DISPOSABLE) IMPLANT
TUBE CONNECTING 20X1/4 (TUBING) IMPLANT
YANKAUER SUCT BULB TIP NO VENT (SUCTIONS) IMPLANT

## 2013-09-29 NOTE — Interval H&P Note (Signed)
History and Physical Interval Note:  09/29/2013 7:04 AM  Jennifer Frazier  has presented today for surgery, with the diagnosis of poor venous access  The various methods of treatment have been discussed with the patient and family. After consideration of risks, benefits and other options for treatment, the patient has consented to  Procedure(s): INSERTION PORT-A-CATH WITH ULTRA SOUND (N/A) as a surgical intervention .  The patient's history has been reviewed, patient examined, no change in status, stable for surgery.  I have reviewed the patient's chart and labs.  Questions were answered to the patient's satisfaction.   Pt in need of chemotherapy.  Discussed port placement.  Risk of bleeding,  Infection,  Pneumothorax,  Hemothorax,  Death ,  Catheter malfunction,  Migration, puncture of adjacent structures and need for other operations.  She agrees to proceed.   Bernette Seeman A.

## 2013-09-29 NOTE — H&P (View-Only) (Signed)
Patient returns 3 weeks after right breast lumpectomy and sentinel lymph node mapping for stage I right breast cancer and DCIS. She has issues with postoperative seroma and this was draine. Cultures did grow Pseudomonas which was pansensitive and she was placed on Cipro. No drainage noted. She is doing much better. She is followed by medical oncology and Oncotype score is 23  Exam: Right breast incision clean dry and intact. No seroma noted. No evidence of redness or fluctuance.   Pathology  :1. Breast, lumpectomy, Right - INVASIVE DUCTAL CARCINOMA, SEE COMMENT. - NEGATIVE FOR LYMPH VASCULAR INVASION. - INVASIVE TUMOR IS 1.2 CM FROM THE NEAREST MARGIN (LATERAL). - HIGH GRADE DUCTAL CARCINOMA IN SITU. - PREVIOUS BIOPSY SITE. - SEE TUMOR SYNOPTIC TEMPLATE BELOW. 2. Breast, excision, Right anterior additional margin - BENIGN BREAST TISSUE, SEE COMMENT. - NEGATIVE FOR ATYPIA OR MALIGNANCY. - SURGICAL MARGIN, NEGATIVE FOR ATYPIA OR MALIGNANCY. 3. Breast, excision, Right posterior additional margin - BENIGN BREAST TISSUE, SEE COMMENT. - NEGATIVE FOR ATYPIA OR MALIGNANCY. - SURGICAL MARGIN, NEGATIVE FOR ATYPIA OR MALIGNANCY. 4. Breast, excision, Right superior additional margin - BENIGN BREAST TISSUE, SEE COMMENT. - NEGATIVE FOR ATYPIA OR MALIGNANCY. - SURGICAL MARGINS, NEGATIVE FOR ATYPIA OR MALIGNANCY. 5. Breast, excision, Right inferior additional margin - BENIGN BREAST TISSUE, SEE COMMENT. - NEGATIVE FOR ATYPIA OR MALIGNANCY. - SURGICAL MARGIN, NEGATIVE FOR ATYPIA OR MALIGNANCY. 6. Breast, excision, Right medial additional margin - BENIGN BREAST TISSUE, SEE COMMENT. - NEGATIVE FOR ATYPIA OR MALIGNANCY. - SURGICAL MARGIN, NEGATIVE FOR ATYPIA OR MALIGNANCY. 7. Breast, excision, Right lateral additional margin - DUCTAL CARCINOMA IN SITU, SEE COMMENT. - IN SITU CARCINOMA IS 6 MM FROM THE NEAREST MARGIN. 8. Lymph node, sentinel, biopsy, Right axillary - ONE LYMPH NODE, NEGATIVE FOR TUMOR  (0/1). 9. Lymph node, sentinel, biopsy, right axillary - ONE LYMPH NODE, NEGATIVE FOR TUMOR (0/1). Microscopic Comment 1. BREAST, INVASIVE TUMOR, WITH LYMPH NODE SAMPLING Specimen, including laterality and lymph node sampling (sentinel, non-sentinel): Right breast with sentinel lymph node sampling 2 of 5 FINAL for Mette, Keslee D ([id number]) Microscopic Comment(continued) Procedure: Lumpectomy Histologic type: Ductal Grade: III of III Tubule formation: 3 Nuclear pleomorphism: 2 Mitotic: 2 Tumor size (glass slide measurement): 0.7 cm Margins: Invasive, distance to closest margin: 1.2 cm In-situ, distance to closest margin: 1.2 cm (lateral) If margin positive, focally or broadly: N/A Lymphovascular invasion: Absent Ductal carcinoma in situ: Present Grade: III of III Extensive intraductal component: Absent Lobular neoplasia: Absent Tumor focality: Unifocal Treatment effect: None If present, treatment effect in breast tissue, lymph nodes or both: N/A Extent of tumor: Skin: N/A Nipple: N/A Skeletal muscle: N/A Lymph nodes: Examined: 2 Sentinel 0 Non-sentinel 2 Total Lymph nodes with metastasis: 0 Isolated tumor cells (< 0.2 mm): N/A Micrometastasis: (> 0.2 mm and < 2.0 mm): N/A Macrometastasis: (> 2.0 mm): N/A Extracapsular extension: N/A Breast prognostic profile: (performed on invasive carcinoma) Estrogen receptor: Pending and will be reported in an addendum. Progesterone receptor: Pending and will be reported in an addendum. Her 2 neu: Pending and will be reported in an addendum. Ki-67: Pending and will be reported in an addendum. Non-neoplastic breast: Previous biopsy site, fibrocystic change, columnar cell hyperplasia/change without atypia. TNM: pT1b, pN0, pMX Comments: Within the 2.0 cm localized area grossly identified, there is a 7 mm focus of high grade invasive ductal carcinoma associated with high grade ductal carcinoma in situ with comedo-type  necrosis. Please see parts 2-7 for the additional anterior, posterior, superior, inferior, medial and lateral margins. (  CR:kh 08-09-13) 2. The surgical resection margin(s) of the specimen were inked and microscopically evaluated. 3. The surgical resection margin(s) of the specimen were inked and microscopically evaluated. 4. The surgical resection margin(s) of the specimen were inked and microscopically evaluated. 6. The surgical resection margin(s) of the specimen were inked and microscopically evaluated. 7. There is no mass grossly identified. Multiple representative sections demonstrate a solitary focus of high grade ductal carcinoma in situ (slide 7C). The focus is 1 mm in greatest dimension and is 6 mm from the nearest surgical margin. CHAD RUND DO Pathologist, Electronic Signature (Case signed 08/09/2013)       Impression: Stage I right breast cancer and DCIS 3 weeks status post lumpectomy and sentinel lymph node mapping Oncotype 23.  Plan: Return in 3 months.  Follow up with oncology.  

## 2013-09-29 NOTE — Transfer of Care (Signed)
Immediate Anesthesia Transfer of Care Note  Patient: Jennifer Frazier  Procedure(s) Performed: Procedure(s): INSERTION PORT-A-CATH WITH ULTRA SOUND (Right)  Patient Location: PACU  Anesthesia Type:General  Level of Consciousness: awake, alert  and oriented  Airway & Oxygen Therapy: Patient Spontanous Breathing and Patient connected to face mask oxygen  Post-op Assessment: Report given to PACU RN and Post -op Vital signs reviewed and stable  Post vital signs: Reviewed and stable  Complications: No apparent anesthesia complications

## 2013-09-29 NOTE — Anesthesia Preprocedure Evaluation (Addendum)
Anesthesia Evaluation  Patient identified by MRN, date of birth, ID band Patient awake    Reviewed: Allergy & Precautions, H&P , NPO status , Patient's Chart, lab work & pertinent test results  History of Anesthesia Complications Negative for: history of anesthetic complications  Airway       Dental   Pulmonary sleep apnea ,          Cardiovascular negative cardio ROS      Neuro/Psych negative neurological ROS  negative psych ROS   GI/Hepatic   Endo/Other    Renal/GU      Musculoskeletal   Abdominal   Peds  Hematology   Anesthesia Other Findings   Reproductive/Obstetrics                           Anesthesia Physical Anesthesia Plan Anesthesia Quick Evaluation

## 2013-09-29 NOTE — Anesthesia Procedure Notes (Signed)
Procedure Name: LMA Insertion Date/Time: 09/29/2013 7:41 AM Performed by: Maryella Shivers Pre-anesthesia Checklist: Patient identified, Emergency Drugs available, Suction available and Patient being monitored Patient Re-evaluated:Patient Re-evaluated prior to inductionOxygen Delivery Method: Circle System Utilized Preoxygenation: Pre-oxygenation with 100% oxygen Intubation Type: IV induction Ventilation: Mask ventilation without difficulty LMA: LMA inserted LMA Size: 4.0 Number of attempts: 1 Airway Equipment and Method: bite block Placement Confirmation: positive ETCO2 Tube secured with: Tape Dental Injury: Teeth and Oropharynx as per pre-operative assessment

## 2013-09-29 NOTE — Op Note (Signed)
[facility] D Chaudoin 1975-03-18 [id number] 09/29/2013   Preoperative diagnosis: PAC needed  Postoperative diagnosis: Same  Procedure: Portacath Placement 8 F clearview with U/S and fluoroscopic guidance right internal jugular vein.  Surgeon: Turner Daniels, MD, FACS  Anesthesia: LMA with 0.25 % local   Clinical History and Indications: The patient is getting ready to begin chemotherapy for her cancer. She  needs a Port-A-Cath for venous access.  Description of Procedure: I have seen the patient in the holding area and confirmed the plans for the procedure as noted above. I reviewed the risks and complications again and the patient has no further questions. She wishes to proceed.   The patient was then taken to the operating room. After satisfactory LMA anesthesia had been obtained the upper chest and lower neck were prepped and draped as a sterile field. The timeout was done. Pt placed into Trendelenberg position.   The right INTERNAL JUGULAR  vein was  Visualize using  10 MEGAHERTZ  Probe and entered under U/S guidence with  the guidewire threaded into the superior vena cava right atrial area under fluoroscopic guidance. An incision was then made on the anterior chest wall and a subcutaneous pocket fashioned for the port reservoir.  The port tubing was then brought through a subcutaneous tunnel from the port site to the guidewire site. The dilator and peel-away sheath were then advanced over the guidewire while monitoring this with fluoroscopy. The guidewire and dilator were removed and the tubing threaded to approximately 20 cm. The peel-away sheath was then removed. The catheter aspirated and flushed easily. Using fluoroscopy the tip was backed out into the superior vena cava right atrial junction area. It aspirated and flushed easily. The reservoir was attached and the locking mechanism engaged. That aspirated and flushed easily.  The reservoir was secured to the fascia with 2 sutures of 2-0  Prolene. A final check with fluoroscopy was done to make sure we had no kinks and good positioning of the tip of the catheter. Everything appeared to be okay. The catheter was aspirated, flushed with dilute heparin and then concentrated aqueous heparin.  The incision was then closed with interrupted 3-0 Vicryl, and 4-0 Monocryl subcuticular with Dermabond on the skin.  There were no operative complications. Estimated blood loss was minimal. All counts were correct. The patient tolerated the procedure well.  Turner Daniels, MD, FACS 09/29/2013 8:38 AM

## 2013-09-29 NOTE — Anesthesia Postprocedure Evaluation (Signed)
  Anesthesia Post-op Note  Patient: Jennifer Frazier  Procedure(s) Performed: Procedure(s): INSERTION PORT-A-CATH WITH ULTRA SOUND (Right)  Patient Location: PACU  Anesthesia Type: General   Level of Consciousness: awake, alert  and oriented  Airway and Oxygen Therapy: Patient Spontanous Breathing  Post-op Pain: mild  Post-op Assessment: Post-op Vital signs reviewed  Post-op Vital Signs: Reviewed  Last Vitals:  Filed Vitals:   09/29/13 0900  BP: 155/83  Pulse: 83  Temp:   Resp: 19    Complications: No apparent anesthesia complications

## 2013-09-29 NOTE — Discharge Instructions (Signed)
Implanted Port Insertion, Care After °Refer to this sheet in the next few weeks. These instructions provide you with information on caring for yourself after your procedure. Your health care provider may also give you more specific instructions. Your treatment has been planned according to current medical practices, but problems sometimes occur. Call your health care provider if you have any problems or questions after your procedure. °WHAT TO EXPECT AFTER THE PROCEDURE °After your procedure, it is typical to have the following:  °· Discomfort at the port insertion site. Ice packs to the area will help. °· Bruising on the skin over the port. This will subside in 3-4 days. °HOME CARE INSTRUCTIONS °· After your port is placed, you will get a manufacturer's information card. The card has information about your port. Keep this card with you at all times.   °· Know what kind of port you have. There are many types of ports available.   °· Wear a medical alert bracelet in case of an emergency. This can help alert health care workers that you have a port.   °· The port can stay in for as long as your health care provider believes it is necessary.   °· A home health care nurse may give medicines and take care of the port.   °· You or a family member can get special training and directions for giving medicine and taking care of the port at home.   °SEEK MEDICAL CARE IF:  °· Your port does not flush or you are unable to get a blood return.   °· You have a fever or chills. °SEEK IMMEDIATE MEDICAL CARE IF: °· You have new fluid or pus coming from your incision.   °· You notice a bad smell coming from your incision site.   °· You have swelling, pain, or more redness at the incision or port site.   °· You have chest pain or shortness of breath. °Document Released: 11/02/2012 Document Revised: 01/17/2013 Document Reviewed: 11/02/2012 °ExitCare® Patient Information ©2015 [employer]. This information is not intended to replace  advice given to you by your health care provider. Make sure you discuss any questions you have with your health care provider. ° ° °Post Anesthesia Home Care Instructions ° °Activity: °Get plenty of rest for the remainder of the day. A responsible adult should stay with you for 24 hours following the procedure.  °For the next 24 hours, DO NOT: °-Drive a car °-Operate machinery °-Drink alcoholic beverages °-Take any medication unless instructed by your physician °-Make any legal decisions or sign important papers. ° °Meals: °Start with liquid foods such as gelatin or soup. Progress to regular foods as tolerated. Avoid greasy, spicy, heavy foods. If nausea and/or vomiting occur, drink only clear liquids until the nausea and/or vomiting subsides. Call your physician if vomiting continues. ° °Special Instructions/Symptoms: °Your throat may feel dry or sore from the anesthesia or the breathing tube placed in your throat during surgery. If this causes discomfort, gargle with warm salt water. The discomfort should disappear within 24 hours. ° °

## 2013-10-01 ENCOUNTER — Telehealth (INDEPENDENT_AMBULATORY_CARE_PROVIDER_SITE_OTHER): Payer: Self-pay | Admitting: General Surgery

## 2013-10-01 NOTE — Telephone Encounter (Signed)
She had a Port-A-Cath placed 2 days ago. She took a Percocet pill at 6:30 this morning. She wants to know when she can drive. I told her is okay to drive four hours after taking Percocet. I told her, also, she should be able to turn her head without pain before driving.

## 2013-10-03 ENCOUNTER — Encounter (HOSPITAL_BASED_OUTPATIENT_CLINIC_OR_DEPARTMENT_OTHER): Payer: Self-pay | Admitting: Surgery

## 2013-10-03 ENCOUNTER — Ambulatory Visit (HOSPITAL_BASED_OUTPATIENT_CLINIC_OR_DEPARTMENT_OTHER): Payer: 59 | Admitting: Nurse Practitioner

## 2013-10-03 ENCOUNTER — Other Ambulatory Visit (HOSPITAL_BASED_OUTPATIENT_CLINIC_OR_DEPARTMENT_OTHER): Payer: 59

## 2013-10-03 VITALS — BP 139/82 | HR 70 | Temp 98.0°F | Resp 18 | Ht 69.0 in | Wt 222.8 lb

## 2013-10-03 DIAGNOSIS — C50411 Malignant neoplasm of upper-outer quadrant of right female breast: Secondary | ICD-10-CM

## 2013-10-03 DIAGNOSIS — D0510 Intraductal carcinoma in situ of unspecified breast: Secondary | ICD-10-CM

## 2013-10-03 DIAGNOSIS — C50419 Malignant neoplasm of upper-outer quadrant of unspecified female breast: Secondary | ICD-10-CM

## 2013-10-03 DIAGNOSIS — Z170 Estrogen receptor positive status [ER+]: Secondary | ICD-10-CM

## 2013-10-03 DIAGNOSIS — Z800 Family history of malignant neoplasm of digestive organs: Secondary | ICD-10-CM

## 2013-10-03 DIAGNOSIS — Z803 Family history of malignant neoplasm of breast: Secondary | ICD-10-CM

## 2013-10-03 LAB — CBC WITH DIFFERENTIAL/PLATELET
BASO%: 0.3 % (ref 0.0–2.0)
Basophils Absolute: 0 10*3/uL (ref 0.0–0.1)
EOS%: 1 % (ref 0.0–7.0)
Eosinophils Absolute: 0.1 10*3/uL (ref 0.0–0.5)
HEMATOCRIT: 40.1 % (ref 34.8–46.6)
HGB: 13.3 g/dL (ref 11.6–15.9)
LYMPH#: 1.9 10*3/uL (ref 0.9–3.3)
LYMPH%: 27.8 % (ref 14.0–49.7)
MCH: 27.1 pg (ref 25.1–34.0)
MCHC: 33.2 g/dL (ref 31.5–36.0)
MCV: 81.7 fL (ref 79.5–101.0)
MONO#: 0.4 10*3/uL (ref 0.1–0.9)
MONO%: 5.7 % (ref 0.0–14.0)
NEUT#: 4.4 10*3/uL (ref 1.5–6.5)
NEUT%: 65.2 % (ref 38.4–76.8)
PLATELETS: 220 10*3/uL (ref 145–400)
RBC: 4.91 10*6/uL (ref 3.70–5.45)
RDW: 12.8 % (ref 11.2–14.5)
WBC: 6.7 10*3/uL (ref 3.9–10.3)

## 2013-10-03 MED ORDER — LIDOCAINE-PRILOCAINE 2.5-2.5 % EX CREA: TOPICAL_CREAM | CUTANEOUS | Status: DC | PRN

## 2013-10-03 MED ORDER — ONDANSETRON HCL 8 MG PO TABS: 8.0000 mg | ORAL_TABLET | Freq: Two times a day (BID) | ORAL | Status: DC | PRN

## 2013-10-03 MED ORDER — PROCHLORPERAZINE MALEATE 10 MG PO TABS: 10.0000 mg | ORAL_TABLET | Freq: Four times a day (QID) | ORAL | Status: DC | PRN

## 2013-10-03 MED ORDER — DEXAMETHASONE 4 MG PO TABS: ORAL_TABLET | ORAL | Status: DC

## 2013-10-03 MED ORDER — LORAZEPAM 0.5 MG PO TABS: 0.5000 mg | ORAL_TABLET | Freq: Four times a day (QID) | ORAL | Status: DC | PRN

## 2013-10-03 NOTE — Progress Notes (Signed)
[place]  Telephone:([phone]) [phone] [phone]) [phone]     ID: [id number] FLAVELL DOB: [birth year]  MR#: [id number]  HQI#:[id number]  Patient Care Team: Sharilyn Sites, MD as PCP - General (Family Medicine) Chauncey Cruel, MD as Consulting Physician (Oncology) Thea Silversmith, MD as Consulting Physician (Radiation Oncology) [name], MD as Consulting Physician (General Surgery)   CHIEF COMPLAINT: Estrogen receptor positive stage I breast cancer  CURRENT TREATMENT: To start systemic adjuvant chemotherapy   BREAST CANCER HISTORY: From the original intake note:  The patient had screening mammography at [facility] showing calcifications in the right breast, and was referred to the breast Center 07/06/2013 for additional views. Right diagnostic mammography confirmed a group of amorphous calcifications in the upper outer right breast measuring 1.2 cm.  Biopsy of this area 07/17/2013 showed ([id number]) ductal carcinoma in situ, high-grade, estrogen receptor 100% positive, and progesterone receptor 96% positive, both with strong staining intensity.  The patient was then referred to surgery and on 07/25/2013 underwent bilateral breast MRI. This showed a breast density category CEA. In the right breast there was an area of clumped nodular enhancement measuring 3 cm. There was also a 1.5 cm circumscribed oval mass in the medial portion of the right breast consistent with a fibroadenoma. The left breast was unremarkable, and there were no abnormal appearing lymph nodes.  On 08/08/2013 the patient underwent right lumpectomy and sentinel lymph node sampling. The pathology ([id number] was (showed, in addition to ductal carcinoma in situ, invasive ductal carcinoma, measuring 0.7 cm, grade 3, estrogen receptor 86% positive, progesterone receptor 89% positive, both with strong staining intensity, with an MIB-1 of 62%, and no HER-2 amplification. Both sentinel lymph nodes were  clear. Margins were ample.   The patient's subsequent history is as detailed below  INTERVAL HISTORY: Jennifer Frazier returns to clinic today for follow up of her breast cancer, accompanied by her mother. Her first round of doxorubicin and cyclophosphamide starts on Thursday. She is very anxious about this and has lots of questions. She is worried about her fertility and loss of hair. She states she will be taking the next few weeks off from work to adjust, though she is aware that patients commonly work while on treatment.   REVIEW OF SYSTEMS: Jennifer Frazier endorses insomnia, anxiety, and depression. A detailed review of systems was otherwise entirely negative.  PAST MEDICAL HISTORY: Past Medical History  Diagnosis Date  . Fibroids     uterine fibroids  . Anemia   . Thyroid disease   . Contact lens/glasses fitting     wears contacts or glasses  . Sleep apnea     has a cpap-does not always use it    PAST SURGICAL HISTORY: Past Surgical History  Procedure Laterality Date  . [place]  2012    urerine ablasion  . Eye surgery  2013    retinal tear-lazer-both  . Wisdom tooth extraction    . Breast surgery      lumpectomy 7/14  . Portacath placement Right 09/29/2013    Procedure: INSERTION PORT-A-CATH WITH ULTRA SOUND;  Surgeon: [name], MD;  Location: [facility];  Service: General;  Laterality: Right;    FAMILY HISTORY Family History  Problem Relation Age of Onset  . Cancer Father 44    colon  . Cancer Maternal Aunt 50    breast cancer  . Cancer Paternal Aunt 10    breast cancer  . Cancer Maternal Grandmother 88    ovarian  or uterine cancer  . Cancer Paternal Grandmother     unknown primary   the patient's father died at the age of 6, been diagnosed with colon cancer at the age of 6. The patient's mother is living, currently 14 years old. The patient has one brother, no sisters. One of the patient's mother's sisters was diagnosed with breast cancer at the age of 22 in one  of the patient's father's mother was diagnosed with breast cancer at the age of 13. There is no history of ovarian cancer in the family.  GYNECOLOGIC HISTORY:  Patient's last menstrual period was 09/11/2013. Menarche age 57, for the patient is GX P0. She still having regular periods. 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    ADVANCED DIRECTIVES: Not in place; at the 08/24/2013 visit the patient was given the appropriate documents to come feel notarize associate may declare healthcare power of attorney.   HEALTH MAINTENANCE: History  Substance Use Topics  . Smoking status: Never Smoker   . Smokeless tobacco: Not on file  . Alcohol Use: No     Colonoscopy:  PAP:  Bone density:  Lipid panel:  Allergies  Allergen Reactions  . Gadolinium      Desc: NAUSEA WITH MRI CONTRAST-NO VOMITING   . Tramadol Hives and Nausea Only    Current Outpatient Prescriptions  Medication Sig Dispense Refill  . Barberry-Oreg Grape-Goldenseal (BERBERINE COMPLEX PO) Take 500 mg by mouth 3 (three) times daily.      . Cholecalciferol (VITAMIN D3) 3000 UNITS TABS Take 10,000 Units by mouth daily.      [name] thyroid (ARMOUR) 130 MG tablet Take 130 mg by mouth daily.      . vitamin B-12 (CYANOCOBALAMIN) 1000 MCG tablet Take 5,000 mcg by mouth daily.      [name] dexamethasone (DECADRON) 4 MG tablet Take 2 tablets by mouth once a day on the day after chemotherapy and then take 2 tablets two times a day for 2 days. Take with food.  30 tablet  1  . ibuprofen (ADVIL,MOTRIN) 100 MG/5ML suspension Take 200 mg by mouth every 4 (four) hours as needed.      [name] LORazepam (ATIVAN) 0.5 MG tablet Take 1 tablet (0.5 mg total) by mouth every 6 (six) hours as needed (Nausea or vomiting).  30 tablet   0  . magnesium gluconate (MAGONATE) 500 MG tablet Take 400 mg by mouth Nightly.      . Omega-3 Fatty Acids (FISH OIL CONCENTRATE PO) Take by mouth.      . ondansetron (ZOFRAN) 8 MG tablet Take 1 tablet (8 mg total) by mouth 2 (two) times daily as needed. Start on the third day after chemotherapy.  30 tablet  1  . oxyCODONE-acetaminophen (ROXICET) 5-325 MG per tablet Take 1 tablet by mouth every 4 (four) hours as needed.  30 tablet  0  . prochlorperazine (COMPAZINE) 10 MG tablet Take 1 tablet (10 mg total) by mouth every 6 (six) hours as needed (Nausea or vomiting).  30 tablet  1  . promethazine (PHENERGAN) 25 MG tablet Take 25 mg by mouth every 6 (six) hours as needed for nausea or vomiting.       Current Facility-Administered Medications  Medication Dose Route Frequency Provider Last Rate Last Dose  . lidocaine-prilocaine (EMLA) cream  Topical PRN Jennifer Duster, NP        OBJECTIVE: Middle-aged [place] American woman who appears stated age 38 Vitals:   10/03/13 1426  BP: 139/82  Pulse: 70  Temp: 98 F (36.7 C)  Resp: 18     Body mass index is 32.89 kg/(m^2).    ECOG FS:1 - Symptomatic but completely ambulatory  Skin: warm, dry  HEENT: sclerae anicteric, conjunctivae pink, oropharynx clear. No thrush or mucositis.  Lymph Nodes: No cervical or supraclavicular lymphadenopathy  Lungs: clear to auscultation bilaterally, no rales, wheezes, or rhonci  Heart: regular rate and rhythm  Abdomen: round, soft, non tender, positive bowel sounds  Musculoskeletal: No focal spinal tenderness, no peripheral edema  Neuro: non focal, well oriented, anxious affect  Breasts: deferred  LAB RESULTS:  CMP     Component Value Date/Time   NA 139 08/24/2013 1229   NA 139 08/04/2013 1245   K 3.9 08/24/2013 1229   K 4.4 08/04/2013 1245   CL 103 08/04/2013 1245   CO2 22 08/24/2013 1229   CO2 23 08/04/2013 1245   GLUCOSE 79 08/24/2013 1229   GLUCOSE 76 08/04/2013 1245   BUN 17.3 08/24/2013 1229   BUN  20 08/04/2013 1245   CREATININE 0.9 08/24/2013 1229   CREATININE 0.68 08/04/2013 1245   CALCIUM 9.9 08/24/2013 1229   CALCIUM 10.0 08/04/2013 1245   PROT 7.8 08/24/2013 1229   PROT 7.4 08/04/2013 1245   ALBUMIN 3.7 08/24/2013 1229   ALBUMIN 4.0 08/04/2013 1245   AST 14 08/24/2013 1229   AST 15 08/04/2013 1245   ALT 9 08/24/2013 1229   ALT 12 08/04/2013 1245   ALKPHOS 89 08/24/2013 1229   ALKPHOS 106 08/04/2013 1245   BILITOT 0.37 08/24/2013 1229   BILITOT 0.2* 08/04/2013 1245   GFRNONAA >90 08/04/2013 1245   GFRAA >90 08/04/2013 1245    I No results found for this basename: SPEP,  UPEP,   kappa and lambda light chains    Lab Results  Component Value Date   WBC 6.7 10/03/2013   NEUTROABS 4.4 10/03/2013   HGB 13.3 10/03/2013   HCT 40.1 10/03/2013   MCV 81.7 10/03/2013   PLT 220 10/03/2013      Chemistry      Component Value Date/Time   NA 139 08/24/2013 1229   NA 139 08/04/2013 1245   K 3.9 08/24/2013 1229   K 4.4 08/04/2013 1245   CL 103 08/04/2013 1245   CO2 22 08/24/2013 1229   CO2 23 08/04/2013 1245   BUN 17.3 08/24/2013 1229   BUN 20 08/04/2013 1245   CREATININE 0.9 08/24/2013 1229   CREATININE 0.68 08/04/2013 1245      Component Value Date/Time   CALCIUM 9.9 08/24/2013 1229   CALCIUM 10.0 08/04/2013 1245   ALKPHOS 89 08/24/2013 1229   ALKPHOS 106 08/04/2013 1245   AST 14 08/24/2013 1229   AST 15 08/04/2013 1245   ALT 9 08/24/2013 1229   ALT 12 08/04/2013 1245   BILITOT 0.37 08/24/2013 1229   BILITOT 0.2* 08/04/2013 1245       No results found for this basename: LABCA2    No components found with this basename: LABCA125    No results found for this basename: INR,  in the last 168 hours  Urinalysis    Component Value Date/Time   COLORURINE YELLOW 10/21/2009 1440   APPEARANCEUR CLEAR 10/21/2009 1440   LABSPEC 1.012 10/21/2009 1440   PHURINE 6.0 10/21/2009 1440  GLUCOSEU NEGATIVE 10/21/2009 1440   HGBUR NEGATIVE 10/21/2009 1440   BILIRUBINUR NEGATIVE 10/21/2009 1440   KETONESUR 15* 10/21/2009  1440   PROTEINUR NEGATIVE 10/21/2009 1440   UROBILINOGEN 0.2 10/21/2009 1440   NITRITE NEGATIVE 10/21/2009 1440   LEUKOCYTESUR NEGATIVE MICROSCOPIC NOT DONE ON URINES WITH NEGATIVE PROTEIN, BLOOD, LEUKOCYTES, NITRITE, OR GLUCOSE <1000 mg/dL. 10/21/2009 1440    STUDIES: No new results found.  ASSESSMENT: 38 y.o. BRCA negative [place] woman status post right breast biopsy 07/17/2013 for high-grade ductal carcinoma in situ, estrogen and progesterone receptor positive  (1) status post right lumpectomy and sentinel lymph node sampling 08/08/2013 for a pT1b pN0, stage IA invasive ductal carcinoma, grade 3, estrogen and progesterone receptor positive, with an MIB-1 of 62% and no HER-2 amplification  (2) genetics sent 08/02/2013 was normal but did identify a variant of uncertain significance called BRCA2, [id number].  (3) Oncotype score of 23 predicts a risk of outside the breast recurrence within 10 years of 15% if the patient's only systemic treatment is tamoxifen for 5 years. Adjuvant chemotherapy was recommended  (4) doxorubicin and cyclophosphamide in dose dense fashion x4 with Neulasta support to start 10/05/2013, to be followed by paclitaxel weekly x12  (5) adjuvant radiation to follow chemotherapy  (6) anti-estrogens for 5-10 years to follow radiation  PLAN: Jennifer Frazier and I spent about 30 minutes today reviewing her chemo regimen and its most common side effects. We also went over the home anti-emetic road map in detail and a copy was given to her to take home. All of the prescriptions listed were sent to her pharmacy. A paper copy of her prescription for ativan was given as well. I advised her on how and when to apply the EMLA cream before her chemo appointment. We talked also about the use of Claritin daily x5 while on the neulasta injections.   Jennifer Frazier had lots of questions regarding hair loss and fertility. She has been given information regarding fertility preservation at a previous office  visit, but has not quite accepted that she will be unlikely to retain the ability to reproduce. As for hair loss I explained that it would not all fall out at once, but more in patches, with some areas thinning out more than others.We discussed that most patients end up cutting the remaining hair strands very low to the scalp and referred her to options such as beanies, wigs, and bandanas in the meantime. We also reviewed that body hair would also begin to thin out as well.   Jennifer Frazier will proceed with day 1, cycle 1 of doxorubicin and cyclophosphamide this Thursday, and receive neulasta on Friday. She understands and agrees with this plan. She has been encouraged to call with any issues that might arise before her next visit here.   Jennifer Duster, NP   10/03/2013 4:46 PM

## 2013-10-04 ENCOUNTER — Ambulatory Visit: Payer: 59

## 2013-10-04 ENCOUNTER — Other Ambulatory Visit: Payer: Self-pay | Admitting: Nurse Practitioner

## 2013-10-04 DIAGNOSIS — C50411 Malignant neoplasm of upper-outer quadrant of right female breast: Secondary | ICD-10-CM

## 2013-10-05 ENCOUNTER — Other Ambulatory Visit: Payer: Self-pay | Admitting: Emergency Medicine

## 2013-10-05 ENCOUNTER — Telehealth: Payer: Self-pay | Admitting: Oncology

## 2013-10-05 ENCOUNTER — Other Ambulatory Visit (HOSPITAL_BASED_OUTPATIENT_CLINIC_OR_DEPARTMENT_OTHER): Payer: 59

## 2013-10-05 ENCOUNTER — Encounter: Payer: Self-pay | Admitting: Oncology

## 2013-10-05 ENCOUNTER — Ambulatory Visit (HOSPITAL_BASED_OUTPATIENT_CLINIC_OR_DEPARTMENT_OTHER): Payer: 59

## 2013-10-05 VITALS — BP 135/68 | HR 77 | Temp 97.4°F | Resp 18

## 2013-10-05 DIAGNOSIS — C50419 Malignant neoplasm of upper-outer quadrant of unspecified female breast: Secondary | ICD-10-CM

## 2013-10-05 DIAGNOSIS — C50411 Malignant neoplasm of upper-outer quadrant of right female breast: Secondary | ICD-10-CM

## 2013-10-05 DIAGNOSIS — Z803 Family history of malignant neoplasm of breast: Secondary | ICD-10-CM

## 2013-10-05 DIAGNOSIS — Z800 Family history of malignant neoplasm of digestive organs: Secondary | ICD-10-CM

## 2013-10-05 DIAGNOSIS — D0510 Intraductal carcinoma in situ of unspecified breast: Secondary | ICD-10-CM

## 2013-10-05 DIAGNOSIS — Z170 Estrogen receptor positive status [ER+]: Secondary | ICD-10-CM

## 2013-10-05 LAB — CBC WITH DIFFERENTIAL/PLATELET
BASO%: 0.2 % (ref 0.0–2.0)
Basophils Absolute: 0 10*3/uL (ref 0.0–0.1)
EOS%: 1.5 % (ref 0.0–7.0)
Eosinophils Absolute: 0.1 10*3/uL (ref 0.0–0.5)
HCT: 41.4 % (ref 34.8–46.6)
HGB: 13.6 g/dL (ref 11.6–15.9)
LYMPH%: 28.3 % (ref 14.0–49.7)
MCH: 26.8 pg (ref 25.1–34.0)
MCHC: 32.9 g/dL (ref 31.5–36.0)
MCV: 81.5 fL (ref 79.5–101.0)
MONO#: 0.3 10*3/uL (ref 0.1–0.9)
MONO%: 6.2 % (ref 0.0–14.0)
NEUT#: 3.4 10*3/uL (ref 1.5–6.5)
NEUT%: 63.8 % (ref 38.4–76.8)
PLATELETS: 233 10*3/uL (ref 145–400)
RBC: 5.08 10*6/uL (ref 3.70–5.45)
RDW: 12.9 % (ref 11.2–14.5)
WBC: 5.3 10*3/uL (ref 3.9–10.3)
lymph#: 1.5 10*3/uL (ref 0.9–3.3)

## 2013-10-05 LAB — COMPREHENSIVE METABOLIC PANEL (CC13)
ALT: 8 U/L (ref 0–55)
AST: 12 U/L (ref 5–34)
Albumin: 3.8 g/dL (ref 3.5–5.0)
Alkaline Phosphatase: 96 U/L (ref 40–150)
Anion Gap: 9 mEq/L (ref 3–11)
BILIRUBIN TOTAL: 0.37 mg/dL (ref 0.20–1.20)
BUN: 15.6 mg/dL (ref 7.0–26.0)
CO2: 22 mEq/L (ref 22–29)
Calcium: 9.9 mg/dL (ref 8.4–10.4)
Chloride: 109 mEq/L (ref 98–109)
Creatinine: 0.9 mg/dL (ref 0.6–1.1)
Glucose: 88 mg/dl (ref 70–140)
Potassium: 3.7 mEq/L (ref 3.5–5.1)
SODIUM: 140 meq/L (ref 136–145)
TOTAL PROTEIN: 7.8 g/dL (ref 6.4–8.3)

## 2013-10-05 MED ORDER — HEPARIN SOD (PORK) LOCK FLUSH 100 UNIT/ML IV SOLN
500.0000 [IU] | Freq: Once | INTRAVENOUS | Status: AC | PRN
  Administered 2013-10-05: 500 [IU]
  Filled 2013-10-05: qty 5

## 2013-10-05 MED ORDER — CYCLOPHOSPHAMIDE CHEMO INJECTION 1 GM
600.0000 mg/m2 | Freq: Once | INTRAMUSCULAR | Status: AC
  Administered 2013-10-05: 1340 mg via INTRAVENOUS
  Filled 2013-10-05: qty 67

## 2013-10-05 MED ORDER — SODIUM CHLORIDE 0.9 % IV SOLN
150.0000 mg | Freq: Once | INTRAVENOUS | Status: AC
  Administered 2013-10-05: 150 mg via INTRAVENOUS
  Filled 2013-10-05: qty 5

## 2013-10-05 MED ORDER — DOXORUBICIN HCL CHEMO IV INJECTION 2 MG/ML
60.0000 mg/m2 | Freq: Once | INTRAVENOUS | Status: AC
  Administered 2013-10-05: 134 mg via INTRAVENOUS
  Filled 2013-10-05: qty 67

## 2013-10-05 MED ORDER — LIDOCAINE-PRILOCAINE 2.5-2.5 % EX CREA: 1.0000 "application " | TOPICAL_CREAM | CUTANEOUS | Status: DC | PRN

## 2013-10-05 MED ORDER — ACETAMINOPHEN 325 MG PO TABS
ORAL_TABLET | ORAL | Status: AC
Start: 2013-10-05 — End: 2013-10-05
  Filled 2013-10-05: qty 2

## 2013-10-05 MED ORDER — PALONOSETRON HCL INJECTION 0.25 MG/5ML
0.2500 mg | Freq: Once | INTRAVENOUS | Status: AC
  Administered 2013-10-05: 0.25 mg via INTRAVENOUS

## 2013-10-05 MED ORDER — DEXAMETHASONE SODIUM PHOSPHATE 20 MG/5ML IJ SOLN
12.0000 mg | Freq: Once | INTRAMUSCULAR | Status: AC
  Administered 2013-10-05: 12 mg via INTRAVENOUS

## 2013-10-05 MED ORDER — SODIUM CHLORIDE 0.9 % IV SOLN
Freq: Once | INTRAVENOUS | Status: AC
  Administered 2013-10-05: 13:00:00 via INTRAVENOUS

## 2013-10-05 MED ORDER — ACETAMINOPHEN 325 MG PO TABS
650.0000 mg | ORAL_TABLET | Freq: Once | ORAL | Status: AC
  Administered 2013-10-05: 650 mg via ORAL

## 2013-10-05 MED ORDER — DEXAMETHASONE SODIUM PHOSPHATE 20 MG/5ML IJ SOLN
INTRAMUSCULAR | Status: AC
  Filled 2013-10-05: qty 5

## 2013-10-05 MED ORDER — PALONOSETRON HCL INJECTION 0.25 MG/5ML
INTRAVENOUS | Status: AC
  Filled 2013-10-05: qty 5

## 2013-10-05 MED ORDER — SODIUM CHLORIDE 0.9 % IJ SOLN
10.0000 mL | INTRAMUSCULAR | Status: DC | PRN
  Administered 2013-10-05: 10 mL
  Filled 2013-10-05: qty 10

## 2013-10-05 NOTE — Progress Notes (Signed)
Per Lattie Haw with [facility] no Josem Kaufmann is needed for jcodes [id number] [id number] ref [phone]

## 2013-10-05 NOTE — Telephone Encounter (Signed)
Pt. Declined referral to Dr. Philipp Ovens

## 2013-10-05 NOTE — Patient Instructions (Signed)
[facility] Discharge Instructions for Patients Receiving Chemotherapy  Today you received the following chemotherapy agents Adriamycin and Cytoxan.  To help prevent nausea and vomiting after your treatment, we encourage you to take your nausea medication.   If you develop nausea and vomiting that is not controlled by your nausea medication, call the clinic.   BELOW ARE SYMPTOMS THAT SHOULD BE REPORTED IMMEDIATELY:  *FEVER GREATER THAN 100.5 F  *CHILLS WITH OR WITHOUT FEVER  NAUSEA AND VOMITING THAT IS NOT CONTROLLED WITH YOUR NAUSEA MEDICATION  *UNUSUAL SHORTNESS OF BREATH  *UNUSUAL BRUISING OR BLEEDING  TENDERNESS IN MOUTH AND THROAT WITH OR WITHOUT PRESENCE OF ULCERS  *URINARY PROBLEMS  *BOWEL PROBLEMS  UNUSUAL RASH Items with * indicate a potential emergency and should be followed up as soon as possible.  Feel free to call the clinic you have any questions or concerns. The clinic phone number is [phone].    

## 2013-10-06 ENCOUNTER — Telehealth: Payer: Self-pay | Admitting: *Deleted

## 2013-10-06 ENCOUNTER — Encounter: Payer: Self-pay | Admitting: Oncology

## 2013-10-06 ENCOUNTER — Ambulatory Visit (HOSPITAL_BASED_OUTPATIENT_CLINIC_OR_DEPARTMENT_OTHER): Payer: 59

## 2013-10-06 VITALS — BP 144/78 | HR 72 | Temp 98.6°F

## 2013-10-06 DIAGNOSIS — C50411 Malignant neoplasm of upper-outer quadrant of right female breast: Secondary | ICD-10-CM

## 2013-10-06 DIAGNOSIS — Z5189 Encounter for other specified aftercare: Secondary | ICD-10-CM

## 2013-10-06 DIAGNOSIS — C50419 Malignant neoplasm of upper-outer quadrant of unspecified female breast: Secondary | ICD-10-CM

## 2013-10-06 MED ORDER — PEGFILGRASTIM INJECTION 6 MG/0.6ML
6.0000 mg | Freq: Once | SUBCUTANEOUS | Status: AC
  Administered 2013-10-06: 6 mg via SUBCUTANEOUS
  Filled 2013-10-06: qty 0.6

## 2013-10-06 NOTE — Telephone Encounter (Signed)
Jennifer Frazier here for Neulasta injection following 1st AC chemo treatment.  States that she is doing OK.  No nausea, vomiting or diarrhea.  Does have a little feeling of fullness, but is trying to eat small amounts and drinking her fluids.  All questions answered.  Knows to call if she has any problems or concerns.

## 2013-10-06 NOTE — Progress Notes (Signed)
Faxed clinical information to [facility] @ [phone]

## 2013-10-06 NOTE — Patient Instructions (Signed)
Pegfilgrastim injection What is this medicine? PEGFILGRASTIM (peg fil GRA stim) is a long-acting granulocyte colony-stimulating factor that stimulates the growth of neutrophils, a type of white blood cell important in the body's fight against infection. It is used to reduce the incidence of fever and infection in patients with certain types of cancer who are receiving chemotherapy that affects the bone marrow. This medicine may be used for other purposes; ask your health care provider or pharmacist if you have questions. COMMON BRAND NAME(S): Neulasta What should I tell my health care provider before I take this medicine? They need to know if you have any of these conditions: -latex allergy -ongoing radiation therapy -sickle cell disease -skin reactions to acrylic adhesives (On-Body Injector only) -an unusual or allergic reaction to pegfilgrastim, filgrastim, other medicines, foods, dyes, or preservatives -pregnant or trying to get pregnant -breast-feeding How should I use this medicine? This medicine is for injection under the skin. If you get this medicine at home, you will be taught how to prepare and give the pre-filled syringe or how to use the On-body Injector. Refer to the patient Instructions for Use for detailed instructions. Use exactly as directed. Take your medicine at regular intervals. Do not take your medicine more often than directed. It is important that you put your used needles and syringes in a special sharps container. Do not put them in a trash can. If you do not have a sharps container, call your pharmacist or healthcare provider to get one. Talk to your pediatrician regarding the use of this medicine in children. Special care may be needed. Overdosage: If you think you have taken too much of this medicine contact a poison control center or emergency room at once. NOTE: This medicine is only for you. Do not share this medicine with others. What if I miss a dose? It is  important not to miss your dose. Call your doctor or health care professional if you miss your dose. If you miss a dose due to an On-body Injector failure or leakage, a new dose should be administered as soon as possible using a single prefilled syringe for manual use. What may interact with this medicine? Interactions have not been studied. Give your health care provider a list of all the medicines, herbs, non-prescription drugs, or dietary supplements you use. Also tell them if you smoke, drink alcohol, or use illegal drugs. Some items may interact with your medicine. This list may not describe all possible interactions. Give your health care provider a list of all the medicines, herbs, non-prescription drugs, or dietary supplements you use. Also tell them if you smoke, drink alcohol, or use illegal drugs. Some items may interact with your medicine. What should I watch for while using this medicine? You may need blood work done while you are taking this medicine. If you are going to need a MRI, CT scan, or other procedure, tell your doctor that you are using this medicine (On-Body Injector only). What side effects may I notice from receiving this medicine? Side effects that you should report to your doctor or health care professional as soon as possible: -allergic reactions like skin rash, itching or hives, swelling of the face, lips, or tongue -dizziness -fever -pain, redness, or irritation at site where injected -pinpoint red spots on the skin -shortness of breath or breathing problems -stomach or side pain, or pain at the shoulder -swelling -tiredness -trouble passing urine Side effects that usually do not require medical attention (report to your doctor   or health care professional if they continue or are bothersome): -bone pain -muscle pain This list may not describe all possible side effects. Call your doctor for medical advice about side effects. You may report side effects to FDA at  1-800-FDA-1088. Where should I keep my medicine? Keep out of the reach of children. Store pre-filled syringes in a refrigerator between 2 and 8 degrees C (36 and 46 degrees F). Do not freeze. Keep in carton to protect from light. Throw away this medicine if it is left out of the refrigerator for more than 48 hours. Throw away any unused medicine after the expiration date. NOTE: This sheet is a summary. It may not cover all possible information. If you have questions about this medicine, talk to your doctor, pharmacist, or health care provider.  2015, [employer]vier/[employer] Standard. (2013-04-13 16:14:05)  

## 2013-10-09 ENCOUNTER — Ambulatory Visit (HOSPITAL_COMMUNITY): Payer: 59

## 2013-10-10 ENCOUNTER — Other Ambulatory Visit: Payer: Self-pay | Admitting: Nurse Practitioner

## 2013-10-10 ENCOUNTER — Ambulatory Visit (HOSPITAL_COMMUNITY)
Admission: RE | Admit: 2013-10-10 | Discharge: 2013-10-10 | Disposition: A | Discharge status: Home / Self Care | Payer: 59 | Source: Ambulatory Visit | Attending: Nurse Practitioner | Admitting: Nurse Practitioner

## 2013-10-10 DIAGNOSIS — C50411 Malignant neoplasm of upper-outer quadrant of right female breast: Secondary | ICD-10-CM

## 2013-10-10 DIAGNOSIS — Z79899 Other long term (current) drug therapy: Secondary | ICD-10-CM | POA: Insufficient documentation

## 2013-10-10 DIAGNOSIS — C50419 Malignant neoplasm of upper-outer quadrant of unspecified female breast: Secondary | ICD-10-CM | POA: Insufficient documentation

## 2013-10-10 MED ORDER — IOHEXOL 300 MG/ML  SOLN
100.0000 mL | Freq: Once | INTRAMUSCULAR | Status: AC | PRN
  Administered 2013-10-10: 100 mL via INTRAVENOUS

## 2013-10-12 ENCOUNTER — Other Ambulatory Visit (HOSPITAL_BASED_OUTPATIENT_CLINIC_OR_DEPARTMENT_OTHER): Payer: 59

## 2013-10-12 ENCOUNTER — Telehealth: Payer: Self-pay | Admitting: Oncology

## 2013-10-12 ENCOUNTER — Ambulatory Visit (HOSPITAL_BASED_OUTPATIENT_CLINIC_OR_DEPARTMENT_OTHER): Payer: 59 | Admitting: Oncology

## 2013-10-12 VITALS — BP 131/77 | HR 66 | Temp 98.5°F | Resp 20 | Ht 69.0 in | Wt 228.3 lb

## 2013-10-12 DIAGNOSIS — Z170 Estrogen receptor positive status [ER+]: Secondary | ICD-10-CM

## 2013-10-12 DIAGNOSIS — C50411 Malignant neoplasm of upper-outer quadrant of right female breast: Secondary | ICD-10-CM

## 2013-10-12 DIAGNOSIS — C50419 Malignant neoplasm of upper-outer quadrant of unspecified female breast: Secondary | ICD-10-CM

## 2013-10-12 DIAGNOSIS — D0510 Intraductal carcinoma in situ of unspecified breast: Secondary | ICD-10-CM

## 2013-10-12 LAB — CBC WITH DIFFERENTIAL/PLATELET
BASO%: 0.2 % (ref 0.0–2.0)
BASOS ABS: 0 10*3/uL (ref 0.0–0.1)
EOS%: 3 % (ref 0.0–7.0)
Eosinophils Absolute: 0.1 10*3/uL (ref 0.0–0.5)
HCT: 36.3 % (ref 34.8–46.6)
HEMOGLOBIN: 11.4 g/dL — AB (ref 11.6–15.9)
LYMPH#: 0.8 10*3/uL — AB (ref 0.9–3.3)
LYMPH%: 37.3 % (ref 14.0–49.7)
MCH: 26.1 pg (ref 25.1–34.0)
MCHC: 31.5 g/dL (ref 31.5–36.0)
MCV: 82.8 fL (ref 79.5–101.0)
MONO#: 0 10*3/uL — ABNORMAL LOW (ref 0.1–0.9)
MONO%: 1.7 % (ref 0.0–14.0)
NEUT#: 1.2 10*3/uL — ABNORMAL LOW (ref 1.5–6.5)
NEUT%: 57.8 % (ref 38.4–76.8)
Platelets: 113 10*3/uL — ABNORMAL LOW (ref 145–400)
RBC: 4.39 10*6/uL (ref 3.70–5.45)
RDW: 13.3 % (ref 11.2–14.5)
WBC: 2.1 10*3/uL — ABNORMAL LOW (ref 3.9–10.3)

## 2013-10-12 LAB — COMPREHENSIVE METABOLIC PANEL
ALK PHOS: 93 U/L (ref 39–117)
ALT: 16 U/L (ref 0–35)
AST: 15 U/L (ref 0–37)
Albumin: 3.3 g/dL — ABNORMAL LOW (ref 3.5–5.2)
BUN: 15 mg/dL (ref 6–23)
CO2: 26 mEq/L (ref 19–32)
CREATININE: 0.74 mg/dL (ref 0.50–1.10)
Calcium: 9.6 mg/dL (ref 8.4–10.5)
Chloride: 104 mEq/L (ref 96–112)
Glucose, Bld: 89 mg/dL (ref 70–99)
Potassium: 4.3 mEq/L (ref 3.5–5.3)
Sodium: 140 mEq/L (ref 135–145)
Total Bilirubin: 0.4 mg/dL (ref 0.2–1.2)
Total Protein: 6.7 g/dL (ref 6.0–8.3)

## 2013-10-12 NOTE — Progress Notes (Signed)
Jennifer Frazier  [phone] [phone]     ID: Jennifer Frazier DOB: 01/20/1976  MR#: [id number]  CSN#:[id number]  Patient Care Team: Jennifer Golding, MD as PCP - General (Family Medicine) Jennifer C Magrinat, MD as Consulting Physician (Oncology) Jennifer Wentworth, MD as Consulting Physician (Radiation Oncology) Jennifer Cornett, MD as Consulting Physician (General Surgery)   CHIEF COMPLAINT: Estrogen receptor positive stage I breast cancer  CURRENT TREATMENT: receiving adjuvant chemotherapy   BREAST CANCER HISTORY: From the original intake note:  The patient had screening mammography at [facility] showing calcifications in the right breast, and Frazier referred to the breast Frazier 07/06/2013 for additional views. Right diagnostic mammography confirmed a group of amorphous calcifications in the upper outer right breast measuring 1.2 cm.  Biopsy of this area 07/17/2013 showed ([id number]) ductal carcinoma in situ, high-grade, estrogen receptor 100% positive, and progesterone receptor 96% positive, both with strong staining intensity.  The patient Frazier then referred to surgery and on 07/25/2013 underwent bilateral breast MRI. This showed a breast density category CEA. In the right breast there Frazier an area of clumped nodular enhancement measuring 3 cm. There Frazier also a 1.5 cm circumscribed oval mass in the medial portion of the right breast consistent with a fibroadenoma. The left breast Frazier unremarkable, and there were no abnormal appearing lymph nodes.  On 08/08/2013 the patient underwent right lumpectomy and sentinel lymph node sampling. The pathology ([id number] Frazier (showed, in addition to ductal carcinoma in situ, invasive ductal carcinoma, measuring 0.7 cm, grade 3, estrogen receptor 86% positive, progesterone receptor 89% positive, both with strong staining intensity, with an MIB-1 of 62%, and no HER-2 amplification. Both sentinel lymph nodes were clear.  Margins were ample.   The patient's subsequent history is as detailed below  INTERVAL HISTORY: Jennifer Frazier returns to clinic today for follow up of her breast cancer, accompanied by her mother. Today is day 8 cycle 1 of 4 planned cycles of cyclophosphamide and doxorubicin, to be followed by weekly paclitaxel x12.  REVIEW OF SYSTEMS: Jennifer Frazier did well on day 1 of chemotherapy and her port worked fine. She had a little bit of turning sensation when the Cytoxan Frazier being given. Jennifer Frazier slowed down and that helped. She got a headache about an hour to into the treatment. That Frazier controlled with Tylenol. She went from her to the vitamin store and then home. She Frazier feeling sleepy but could drive. She had absolutely no nausea then or over the next few days.  On day 2 she received the Neulasta in the afternoon. Perhaps because of that she had absolutely no aches or pains. She did fine on day 3, Saturday. On day 4 however, her back her a little and she felt strange, like her left side Frazier awake but her right side Frazier asleep and this is a day when she stopped taking steroids. She also noted a taste alteration. It feels to her like there is a film in her mouth. Jennifer Frazier slightly constipated until day 5. She slept very poorly the first night, a little better the next 2 nights, and now she is sleeping back to her normal pattern. Her eyes have been drive to the point that she cannot use contact.  This morning though she started on her treadmill and Frazier on it for an hour. A detailed review of systems today Frazier otherwise noncontributory  PAST MEDICAL HISTORY: Past Medical History  Diagnosis Date  . Fibroids       uterine fibroids  . Anemia   . Thyroid disease   . Contact lens/glasses fitting     wears contacts or glasses  . Sleep apnea     has a cpap-does not always use it    PAST SURGICAL HISTORY: Past Surgical History  Procedure Laterality Date  . [place]  2012    urerine ablasion  . Eye surgery  2013    retinal  tear-lazer-both  . Wisdom tooth extraction    . Breast surgery      lumpectomy 7/14  . Portacath placement Right 09/29/2013    Procedure: INSERTION PORT-A-CATH WITH ULTRA SOUND;  Surgeon: [name], MD;  Location: [facility];  Service: General;  Laterality: Right;    FAMILY HISTORY Family History  Problem Relation Age of Onset  . Cancer Father 45    colon  . Cancer Maternal Aunt 50    breast cancer  . Cancer Paternal Aunt 41    breast cancer  . Cancer Maternal Grandmother 65    ovarian or uterine cancer  . Cancer Paternal Grandmother     unknown primary   the patient's father died at the age of 76, been diagnosed with colon cancer at the age of 21. The patient's mother is living, currently 44 years old. The patient has one brother, no sisters. One of the patient's mother's sisters Frazier diagnosed with breast cancer at the age of 5 in one of the patient's father's mother Frazier diagnosed with breast cancer at the age of 74. There is no history of ovarian cancer in the family.  GYNECOLOGIC HISTORY:  Patient's last menstrual period Frazier 09/11/2013. Menarche age 44, for the patient is GX P0. She still having regular periods. 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    ADVANCED DIRECTIVES: Not in place; at the 08/24/2013 visit the patient Frazier given the appropriate documents to come feel notarize associate may declare healthcare power of attorney.   HEALTH MAINTENANCE: History  Substance Use Topics  . Smoking status: Never Smoker   . Smokeless tobacco: Not on file  . Alcohol Use: No     Colonoscopy:  PAP:  Bone density:  Lipid panel:  Allergies  Allergen Reactions  . Gadolinium      Desc: NAUSEA WITH MRI CONTRAST-NO VOMITING    . Tramadol Hives and Nausea Only    Current Outpatient Prescriptions  Medication Sig Dispense Refill  . Barberry-Oreg Grape-Goldenseal (BERBERINE COMPLEX PO) Take 500 mg by mouth 3 (three) times daily.      . Cholecalciferol (VITAMIN D3) 3000 UNITS TABS Take 10,000 Units by mouth daily.      [name] dexamethasone (DECADRON) 4 MG tablet Take 2 tablets by mouth once a day on the day after chemotherapy and then take 2 tablets two times a day for 2 days. Take with food.  30 tablet  1  . ibuprofen (ADVIL,MOTRIN) 100 MG/5ML suspension Take 200 mg by mouth every 4 (four) hours as needed.      . lidocaine-prilocaine (EMLA) cream Apply 1 application topically as needed.  30 g  0  . LORazepam (ATIVAN) 0.5 MG tablet Take 1 tablet (0.5 mg total) by mouth every 6 (six) hours as needed (Nausea or vomiting).  30 tablet  0  . magnesium gluconate (MAGONATE) 500 MG tablet  Take 400 mg by mouth Nightly.      . Omega-3 Fatty Acids (FISH OIL CONCENTRATE PO) Take by mouth.      . ondansetron (ZOFRAN) 8 MG tablet Take 1 tablet (8 mg total) by mouth 2 (two) times daily as needed. Start on the third day after chemotherapy.  30 tablet  1  . oxyCODONE-acetaminophen (ROXICET) 5-325 MG per tablet Take 1 tablet by mouth every 4 (four) hours as needed.  30 tablet  0  . prochlorperazine (COMPAZINE) 10 MG tablet Take 1 tablet (10 mg total) by mouth every 6 (six) hours as needed (Nausea or vomiting).  30 tablet  1  . promethazine (PHENERGAN) 25 MG tablet Take 25 mg by mouth every 6 (six) hours as needed for nausea or vomiting.      . thyroid (ARMOUR) 130 MG tablet Take 130 mg by mouth daily.      . vitamin B-12 (CYANOCOBALAMIN) 1000 MCG tablet Take 5,000 mcg by mouth daily.       No current facility-administered medications for this visit.    OBJECTIVE: Middle-aged [place] American woman in no acute distress Filed Vitals:   10/12/13 1650  BP: 131/77  Pulse: 66  Temp: 98.5 F (36.9 C)  Resp: 20     Body mass index is 33.7  kg/(m^2).    ECOG FS:1 - Symptomatic but completely ambulatory  Sclerae unicteric, eyes appear normal Jennifer moist Oropharynx clear, teeth in good repair No cervical or supraclavicular adenopathy Lungs no rales or rhonchi Heart regular rate and rhythm Abd soft, obese, nontender, positive bowel sounds MSK no focal spinal tenderness, no upper extremity lymphedema Neuro: nonfocal, well oriented, appropriate affect Breasts: The abnormality in the right breast is easily palpable in the upper outer quadrant. It is unchanged from baseline at this point. There is no erythema or nipple retraction. The right axilla is benign to the left breast is unremarkable.    LAB RESULTS:  CMP     Component Value Date/Time   NA 140 10/05/2013 1045   NA 139 08/04/2013 1245   K 3.7 10/05/2013 1045   K 4.4 08/04/2013 1245   CL 103 08/04/2013 1245   CO2 22 10/05/2013 1045   CO2 23 08/04/2013 1245   GLUCOSE 88 10/05/2013 1045   GLUCOSE 76 08/04/2013 1245   BUN 15.6 10/05/2013 1045   BUN 20 08/04/2013 1245   CREATININE 0.9 10/05/2013 1045   CREATININE 0.68 08/04/2013 1245   CALCIUM 9.9 10/05/2013 1045   CALCIUM 10.0 08/04/2013 1245   PROT 7.8 10/05/2013 1045   PROT 7.4 08/04/2013 1245   ALBUMIN 3.8 10/05/2013 1045   ALBUMIN 4.0 08/04/2013 1245   AST 12 10/05/2013 1045   AST 15 08/04/2013 1245   ALT 8 10/05/2013 1045   ALT 12 08/04/2013 1245   ALKPHOS 96 10/05/2013 1045   ALKPHOS 106 08/04/2013 1245   BILITOT 0.37 10/05/2013 1045   BILITOT 0.2* 08/04/2013 1245   GFRNONAA >90 08/04/2013 1245   GFRAA >90 08/04/2013 1245    I No results found for this basename: SPEP,  UPEP,   kappa and lambda light chains    Lab Results  Component Value Date   WBC 2.1* 10/12/2013   NEUTROABS 1.2* 10/12/2013   HGB 11.4* 10/12/2013   HCT 36.3 10/12/2013   MCV 82.8 10/12/2013   PLT 113* 10/12/2013      Chemistry      Component Value Date/Time   NA 140 10/05/2013 1045   NA 139 08/04/2013  1245   K 3.7 10/05/2013 1045   K 4.4 08/04/2013 1245    CL 103 08/04/2013 1245   CO2 22 10/05/2013 1045   CO2 23 08/04/2013 1245   BUN 15.6 10/05/2013 1045   BUN 20 08/04/2013 1245   CREATININE 0.9 10/05/2013 1045   CREATININE 0.68 08/04/2013 1245      Component Value Date/Time   CALCIUM 9.9 10/05/2013 1045   CALCIUM 10.0 08/04/2013 1245   ALKPHOS 96 10/05/2013 1045   ALKPHOS 106 08/04/2013 1245   AST 12 10/05/2013 1045   AST 15 08/04/2013 1245   ALT 8 10/05/2013 1045   ALT 12 08/04/2013 1245   BILITOT 0.37 10/05/2013 1045   BILITOT 0.2* 08/04/2013 1245       No results found for this basename: LABCA2    No components found with this basename: LABCA125    No results found for this basename: INR,  in the last 168 hours  Urinalysis    Component Value Date/Time   COLORURINE YELLOW 10/21/2009 1440   APPEARANCEUR CLEAR 10/21/2009 1440   LABSPEC 1.012 10/21/2009 1440   PHURINE 6.0 10/21/2009 1440   GLUCOSEU NEGATIVE 10/21/2009 1440   HGBUR NEGATIVE 10/21/2009 1440   BILIRUBINUR NEGATIVE 10/21/2009 1440   KETONESUR 15* 10/21/2009 1440   PROTEINUR NEGATIVE 10/21/2009 1440   UROBILINOGEN 0.2 10/21/2009 1440   NITRITE NEGATIVE 10/21/2009 1440   LEUKOCYTESUR NEGATIVE MICROSCOPIC NOT DONE ON URINES WITH NEGATIVE PROTEIN, BLOOD, LEUKOCYTES, NITRITE, OR GLUCOSE <1000 mg/dL. 10/21/2009 1440    STUDIES: Ct Chest W Contrast  10/10/2013   CLINICAL DATA:  New diagnosis breast cancer. Chemotherapy ongoing. Left breast cancer  EXAM: CT CHEST, ABDOMEN, AND PELVIS WITH CONTRAST  TECHNIQUE: Multidetector CT imaging of the chest, abdomen and pelvis Frazier performed following the standard protocol during bolus administration of intravenous contrast.  CONTRAST:  100mL OMNIPAQUE IOHEXOL 300 MG/ML  SOLN  COMPARISON:  None.  FINDINGS: CT CHEST FINDINGS  There is a port in the right chest wall. There is no axillary lymphadenopathy. There are lymphadenectomy clips in the right axilla. No mediastinal or hilar lymphadenopathy. No pericardial fluid. No central pulmonary embolism.  Review  of the lung parenchyma new demonstrates no pulmonary nodules. Airways are normal.  CT ABDOMEN AND PELVIS FINDINGS  Hepatobiliary: No focal hepatic lesion.  Gallbladder is normal.  Spleen:  Spleen is normal.  Pancreas: Pancreas is normal.  Stomach/Bowel: Stomach, small bowel wall, appendix, and colon are normal. Moderate volume stool throughout the colon. No obstructing lesion.  Adrenals/urinary tract: Adrenal glands and kidneys are normal.  Vascular/Lymphatic: Abdominal is normal caliber. No retroperitoneal or periportal lymphadenopathy. No mesenteric adenopathy. No peritoneal implants.  Muskuloskeletal: No aggressive osseous lesion.  Other: Fibroid uterus. The ovaries are normal. No pelvic lymphadenopathy.  IMPRESSION: Chest Impression:  1. No evidence of thoracic metastasis. 2. Lymphadenectomy clips in the right axilla.  Abdomen / Pelvis Impression:  1. No evidence metastasis in the abdomen or pelvis. 2. Calcified uterine fibroids.   Electronically Signed   By: Stewart  Edmunds M.D.   On: 10/10/2013 08:32   Ct Abdomen Pelvis W Contrast  10/10/2013   CLINICAL DATA:  New diagnosis breast cancer. Chemotherapy ongoing. Left breast cancer  EXAM: CT CHEST, ABDOMEN, AND PELVIS WITH CONTRAST  TECHNIQUE: Multidetector CT imaging of the chest, abdomen and pelvis Frazier performed following the standard protocol during bolus administration of intravenous contrast.  CONTRAST:  100mL OMNIPAQUE IOHEXOL 300 MG/ML  SOLN  COMPARISON:  None.  FINDINGS: CT CHEST FINDINGS    There is a port in the right chest wall. There is no axillary lymphadenopathy. There are lymphadenectomy clips in the right axilla. No mediastinal or hilar lymphadenopathy. No pericardial fluid. No central pulmonary embolism.  Review of the lung parenchyma new demonstrates no pulmonary nodules. Airways are normal.  CT ABDOMEN AND PELVIS FINDINGS  Hepatobiliary: No focal hepatic lesion.  Gallbladder is normal.  Spleen:  Spleen is normal.  Pancreas: Pancreas is  normal.  Stomach/Bowel: Stomach, small bowel wall, appendix, and colon are normal. Moderate volume stool throughout the colon. No obstructing lesion.  Adrenals/urinary tract: Adrenal glands and kidneys are normal.  Vascular/Lymphatic: Abdominal is normal caliber. No retroperitoneal or periportal lymphadenopathy. No mesenteric adenopathy. No peritoneal implants.  Muskuloskeletal: No aggressive osseous lesion.  Other: Fibroid uterus. The ovaries are normal. No pelvic lymphadenopathy.  IMPRESSION: Chest Impression:  1. No evidence of thoracic metastasis. 2. Lymphadenectomy clips in the right axilla.  Abdomen / Pelvis Impression:  1. No evidence metastasis in the abdomen or pelvis. 2. Calcified uterine fibroids.   Electronically Signed   By: Suzy Bouchard M.D.   On: 10/10/2013 08:32   Dg Chest Port 1 View  09/29/2013   CLINICAL DATA:  Line placement  EXAM: PORTABLE CHEST - 1 VIEW  COMPARISON:  Portable exam 0858 hr without priors for comparison  FINDINGS: RIGHT jugular power port with tip projecting over cavoatrial junction.  Normal heart size, mediastinal contours and pulmonary vascularity.  Lungs clear.  No pleural effusion or pneumothorax.  Bones unremarkable.  IMPRESSION: RIGHT jugular power port without pneumothorax.   Electronically Signed   By: Lavonia Dana M.D.   On: 09/29/2013 09:25   Dg Fluoro Guide Cv Line-no Report  09/29/2013   CLINICAL DATA: port placement   FLOURO GUIDE CV LINE  Fluoroscopy Frazier utilized by the requesting physician.  No radiographic  interpretation.    ASSESSMENT: 38 y.o. BRCA negative [place] woman status post right breast biopsy 07/17/2013 for high-grade ductal carcinoma in situ, estrogen and progesterone receptor positive  (1) status post right lumpectomy and sentinel lymph node sampling 08/08/2013 for a pT1b pN0, stage IA invasive ductal carcinoma, grade 3, estrogen and progesterone receptor positive, with an MIB-1 of 62% and no HER-2 amplification  (2) genetics sent  08/02/2013 Frazier normal but did identify a variant of uncertain significance called BRCA2, [id number].  (3) Oncotype score of 23 predicts a risk of outside the breast recurrence within 10 years of 15% if the patient's only systemic treatment is tamoxifen for 5 years. Adjuvant chemotherapy Frazier recommended  (4) doxorubicin and cyclophosphamide in dose dense fashion x4 with Neulasta support to start 10/05/2013, to be followed by paclitaxel weekly x12  (5) adjuvant radiation to follow chemotherapy  (6) anti-estrogens for 5-10 years to follow radiation  PLAN: Deloma did remarkably well with her first cycle and we are only making very minor changes before the second one. Since she had some headache from the palonosetron she will likely have it each time so she might as well plan to take Tylenol when she comes here for her treatment. (I am adding that to her pre-meds.).  Also she Frazier moderately constipated for about 3 days. She is using Ignacia Marvel these for this but I suggested she also try a stool softeners.  Her eyes are a little bit dry. That is going to happen again. I think the best thing to do is to just plan to use her glasses instead of contact for a few days after chemotherapy. Unfortunately  the altered sense of taste is not going to go away for a while.  She is very concerned that her weight is up 6 pounds. I suspect most of that is fluid secondary to her steroids. However she admits that yesterday she 8 all 4 of the "lights", namely past I, bread, Risa, and potatoes. We went over the optimal diet for her and she is a severely going to have to avoid those if she does not want to get a significant amount of weight, which later on we'll be hard for her to lose.  She really has her appointment for the next several weeks and she will see me again as she finishes the first part of her chemotherapy, the cyclophosphamide and doxorubicin, and before starting the paclitaxel. At this point I am very  encouraged that she will be able to complete treatment without significant delays or dose reductions.  She knows to call for any problems that may develop before the next visit. Chauncey Cruel, MD   10/12/2013 4:54 PM

## 2013-10-12 NOTE — Telephone Encounter (Signed)
per pof to CX inj 9/23 per GM-gave pt updated copy of sch

## 2013-10-18 ENCOUNTER — Ambulatory Visit: Payer: 59

## 2013-10-19 ENCOUNTER — Encounter: Payer: Self-pay | Admitting: Nurse Practitioner

## 2013-10-19 ENCOUNTER — Ambulatory Visit (HOSPITAL_BASED_OUTPATIENT_CLINIC_OR_DEPARTMENT_OTHER): Payer: 59 | Admitting: Nurse Practitioner

## 2013-10-19 ENCOUNTER — Ambulatory Visit: Payer: 59

## 2013-10-19 ENCOUNTER — Other Ambulatory Visit (HOSPITAL_BASED_OUTPATIENT_CLINIC_OR_DEPARTMENT_OTHER): Payer: 59

## 2013-10-19 ENCOUNTER — Other Ambulatory Visit: Payer: 59

## 2013-10-19 ENCOUNTER — Ambulatory Visit (HOSPITAL_BASED_OUTPATIENT_CLINIC_OR_DEPARTMENT_OTHER): Payer: 59

## 2013-10-19 VITALS — BP 139/90 | HR 75 | Temp 98.0°F | Resp 18 | Ht 69.0 in | Wt 222.1 lb

## 2013-10-19 DIAGNOSIS — Z803 Family history of malignant neoplasm of breast: Secondary | ICD-10-CM

## 2013-10-19 DIAGNOSIS — Z800 Family history of malignant neoplasm of digestive organs: Secondary | ICD-10-CM

## 2013-10-19 DIAGNOSIS — D0510 Intraductal carcinoma in situ of unspecified breast: Secondary | ICD-10-CM

## 2013-10-19 DIAGNOSIS — Z170 Estrogen receptor positive status [ER+]: Secondary | ICD-10-CM

## 2013-10-19 DIAGNOSIS — C50419 Malignant neoplasm of upper-outer quadrant of unspecified female breast: Secondary | ICD-10-CM

## 2013-10-19 DIAGNOSIS — Z5111 Encounter for antineoplastic chemotherapy: Secondary | ICD-10-CM

## 2013-10-19 DIAGNOSIS — C50411 Malignant neoplasm of upper-outer quadrant of right female breast: Secondary | ICD-10-CM

## 2013-10-19 LAB — CBC WITH DIFFERENTIAL/PLATELET
BASO%: 0.5 % (ref 0.0–2.0)
BASOS ABS: 0 10*3/uL (ref 0.0–0.1)
EOS%: 0.2 % (ref 0.0–7.0)
Eosinophils Absolute: 0 10*3/uL (ref 0.0–0.5)
HEMATOCRIT: 39.2 % (ref 34.8–46.6)
HEMOGLOBIN: 12.4 g/dL (ref 11.6–15.9)
LYMPH%: 19.9 % (ref 14.0–49.7)
MCH: 26.1 pg (ref 25.1–34.0)
MCHC: 31.6 g/dL (ref 31.5–36.0)
MCV: 82.3 fL (ref 79.5–101.0)
MONO#: 0.3 10*3/uL (ref 0.1–0.9)
MONO%: 7.3 % (ref 0.0–14.0)
NEUT#: 2.9 10*3/uL (ref 1.5–6.5)
NEUT%: 72.1 % (ref 38.4–76.8)
PLATELETS: 213 10*3/uL (ref 145–400)
RBC: 4.76 10*6/uL (ref 3.70–5.45)
RDW: 13.5 % (ref 11.2–14.5)
WBC: 4 10*3/uL (ref 3.9–10.3)
lymph#: 0.8 10*3/uL — ABNORMAL LOW (ref 0.9–3.3)

## 2013-10-19 LAB — COMPREHENSIVE METABOLIC PANEL (CC13)
ALK PHOS: 94 U/L (ref 40–150)
ALT: 12 U/L (ref 0–55)
AST: 15 U/L (ref 5–34)
Albumin: 3.9 g/dL (ref 3.5–5.0)
Anion Gap: 11 mEq/L (ref 3–11)
BUN: 13 mg/dL (ref 7.0–26.0)
CALCIUM: 10.2 mg/dL (ref 8.4–10.4)
CHLORIDE: 109 meq/L (ref 98–109)
CO2: 23 mEq/L (ref 22–29)
Creatinine: 0.9 mg/dL (ref 0.6–1.1)
Glucose: 71 mg/dl (ref 70–140)
Potassium: 3.5 mEq/L (ref 3.5–5.1)
Sodium: 143 mEq/L (ref 136–145)
Total Bilirubin: 0.37 mg/dL (ref 0.20–1.20)
Total Protein: 7.7 g/dL (ref 6.4–8.3)

## 2013-10-19 MED ORDER — PALONOSETRON HCL INJECTION 0.25 MG/5ML
INTRAVENOUS | Status: AC
  Filled 2013-10-19: qty 5

## 2013-10-19 MED ORDER — ACETAMINOPHEN 325 MG PO TABS
ORAL_TABLET | ORAL | Status: AC
  Filled 2013-10-19: qty 2

## 2013-10-19 MED ORDER — ACETAMINOPHEN 325 MG PO TABS
650.0000 mg | ORAL_TABLET | Freq: Once | ORAL | Status: AC
  Administered 2013-10-19: 650 mg via ORAL

## 2013-10-19 MED ORDER — DEXAMETHASONE SODIUM PHOSPHATE 20 MG/5ML IJ SOLN
INTRAMUSCULAR | Status: AC
  Filled 2013-10-19: qty 5

## 2013-10-19 MED ORDER — PALONOSETRON HCL INJECTION 0.25 MG/5ML
0.2500 mg | Freq: Once | INTRAVENOUS | Status: AC
  Administered 2013-10-19: 0.25 mg via INTRAVENOUS

## 2013-10-19 MED ORDER — SODIUM CHLORIDE 0.9 % IJ SOLN
10.0000 mL | INTRAMUSCULAR | Status: DC | PRN
  Administered 2013-10-19: 10 mL
  Filled 2013-10-19: qty 10

## 2013-10-19 MED ORDER — HEPARIN SOD (PORK) LOCK FLUSH 100 UNIT/ML IV SOLN
500.0000 [IU] | Freq: Once | INTRAVENOUS | Status: AC | PRN
  Administered 2013-10-19: 500 [IU]
  Filled 2013-10-19: qty 5

## 2013-10-19 MED ORDER — DEXAMETHASONE SODIUM PHOSPHATE 20 MG/5ML IJ SOLN
12.0000 mg | Freq: Once | INTRAMUSCULAR | Status: AC
  Administered 2013-10-19: 12 mg via INTRAVENOUS

## 2013-10-19 MED ORDER — DOXORUBICIN HCL CHEMO IV INJECTION 2 MG/ML
60.0000 mg/m2 | Freq: Once | INTRAVENOUS | Status: AC
  Administered 2013-10-19: 134 mg via INTRAVENOUS
  Filled 2013-10-19: qty 67

## 2013-10-19 MED ORDER — SODIUM CHLORIDE 0.9 % IV SOLN
Freq: Once | INTRAVENOUS | Status: AC
  Administered 2013-10-19: 13:00:00 via INTRAVENOUS

## 2013-10-19 MED ORDER — SODIUM CHLORIDE 0.9 % IV SOLN
150.0000 mg | Freq: Once | INTRAVENOUS | Status: AC
  Administered 2013-10-19: 150 mg via INTRAVENOUS
  Filled 2013-10-19: qty 5

## 2013-10-19 MED ORDER — SODIUM CHLORIDE 0.9 % IV SOLN
600.0000 mg/m2 | Freq: Once | INTRAVENOUS | Status: AC
  Administered 2013-10-19: 1340 mg via INTRAVENOUS
  Filled 2013-10-19: qty 67

## 2013-10-19 NOTE — Patient Instructions (Signed)
[facility] Discharge Instructions for Patients Receiving Chemotherapy  Today you received the following chemotherapy agents Adriamycin and Cytoxan  To help prevent nausea and vomiting after your treatment, we encourage you to take your nausea medication  Compazine and Zofran as directed   If you develop nausea and vomiting that is not controlled by your nausea medication, call the clinic.   BELOW ARE SYMPTOMS THAT SHOULD BE REPORTED IMMEDIATELY:  *FEVER GREATER THAN 100.5 F  *CHILLS WITH OR WITHOUT FEVER  NAUSEA AND VOMITING THAT IS NOT CONTROLLED WITH YOUR NAUSEA MEDICATION  *UNUSUAL SHORTNESS OF BREATH  *UNUSUAL BRUISING OR BLEEDING  TENDERNESS IN MOUTH AND THROAT WITH OR WITHOUT PRESENCE OF ULCERS  *URINARY PROBLEMS  *BOWEL PROBLEMS  UNUSUAL RASH Items with * indicate a potential emergency and should be followed up as soon as possible.  Feel free to call the clinic you have any questions or concerns. The clinic phone number is [phone].

## 2013-10-19 NOTE — Progress Notes (Signed)
[place]  [phone] Fax:([phone]) [phone]     ID: [name] DOB: 18-Nov-1975  MR#: [id number]  UDJ#:[id number]  Patient Care Team: Sharilyn Sites, MD as PCP - General (Family Medicine) Chauncey Cruel, MD as Consulting Physician (Oncology) Thea Silversmith, MD as Consulting Physician (Radiation Oncology) [name], MD as Consulting Physician (General Surgery)   CHIEF COMPLAINT: Estrogen receptor positive stage I breast cancer  CURRENT TREATMENT: receiving adjuvant chemotherapy   BREAST CANCER HISTORY: From the original intake note:  The patient had screening mammography at [facility] showing calcifications in the right breast, and was referred to the breast Center 07/06/2013 for additional views. Right diagnostic mammography confirmed a group of amorphous calcifications in the upper outer right breast measuring 1.2 cm.  Biopsy of this area 07/17/2013 showed ([id number]) ductal carcinoma in situ, high-grade, estrogen receptor 100% positive, and progesterone receptor 96% positive, both with strong staining intensity.  The patient was then referred to surgery and on 07/25/2013 underwent bilateral breast MRI. This showed a breast density category CEA. In the right breast there was an area of clumped nodular enhancement measuring 3 cm. There was also a 1.5 cm circumscribed oval mass in the medial portion of the right breast consistent with a fibroadenoma. The left breast was unremarkable, and there were no abnormal appearing lymph nodes.  On 08/08/2013 the patient underwent right lumpectomy and sentinel lymph node sampling. The pathology ([id number] was (showed, in addition to ductal carcinoma in situ, invasive ductal carcinoma, measuring 0.7 cm, grade 3, estrogen receptor 86% positive, progesterone receptor 89% positive, both with strong staining intensity, with an MIB-1 of 62%, and no HER-2 amplification. Both sentinel lymph nodes were clear.  Margins were ample.   The patient's subsequent history is as detailed below  INTERVAL HISTORY: [name] returns to clinic today for follow up of her breast cancer, accompanied by her mother. Today is day 1 cycle 2 of 4 planned cycles of cyclophosphamide and doxorubicin, to be followed by weekly paclitaxel x12.  REVIEW OF SYSTEMS: Delcia tolerating her first round of chemo well. She had some fatigue and then body aches from the neulasta. She had a headache during the cytoxan, but tylenol will be given before chemo starts this round. She denies fevers, chills, nausea, vomiting, or changes in bowel or bladder habits. She has no shortness of breath, chest pain, cough, or palpitations. She had some difficulty sleeping, likely secondary to the steroids. A detailed review of systems is otherwise noncontributory.   PAST MEDICAL HISTORY: Past Medical History  Diagnosis Date  . Fibroids     uterine fibroids  . Anemia   . Thyroid disease   . Contact lens/glasses fitting     wears contacts or glasses  . Sleep apnea     has a cpap-does not always use it    PAST SURGICAL HISTORY: Past Surgical History  Procedure Laterality Date  . [place]  2012    urerine ablasion  . Eye surgery  2013    retinal tear-lazer-both  . Wisdom tooth extraction    . Breast surgery      lumpectomy 7/14  . Portacath placement Right 09/29/2013    Procedure: INSERTION PORT-A-CATH WITH ULTRA SOUND;  Surgeon: [name], MD;  Location: [facility];  Service: General;  Laterality: Right;    FAMILY HISTORY Family History  Problem Relation Age of Onset  . Cancer Father 59    colon  . Cancer Maternal Aunt 50  breast cancer  . Cancer Paternal Aunt 52    breast cancer  . Cancer Maternal Grandmother 16    ovarian or uterine cancer  . Cancer Paternal Grandmother     unknown primary   the patient's father died at the age of 26, been diagnosed with colon cancer at the age of 45. The patient's mother is living,  currently 47 years old. The patient has one brother, no sisters. One of the patient's mother's sisters was diagnosed with breast cancer at the age of 35 in one of the patient's father's mother was diagnosed with breast cancer at the age of 20. There is no history of ovarian cancer in the family.  GYNECOLOGIC HISTORY:  Patient's last menstrual period was 09/11/2013. Menarche age 5, for the patient is GX P0. She still having regular periods. 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 She is single and her mother lives with her.(The patient's mother is wheelchair-bound secondary to a stroke).    ADVANCED DIRECTIVES: Not in place; at the 08/24/2013 visit the patient was given the appropriate documents to come feel notarize associate may declare healthcare power of attorney.   HEALTH MAINTENANCE: History  Substance Use Topics  . Smoking status: Never Smoker   . Smokeless tobacco: Not on file  . Alcohol Use: No     Colonoscopy:  PAP:  Bone density:  Lipid panel:  Allergies  Allergen Reactions  . Gadolinium      Desc: NAUSEA WITH MRI CONTRAST-NO VOMITING   . Tramadol Hives and Nausea Only    Current Outpatient Prescriptions  Medication Sig Dispense Refill  . Cholecalciferol (VITAMIN D3) 3000 UNITS TABS Take 10,000 Units by mouth daily.      [name] dexamethasone (DECADRON) 4 MG tablet Take 2 tablets by mouth once a day on the day after chemotherapy and then take 2 tablets two times a day for 2 days. Take with food.  30 tablet  1  . ibuprofen (ADVIL,MOTRIN) 100 MG/5ML suspension Take 200 mg by mouth every 4 (four) hours as needed.      . lidocaine-prilocaine (EMLA) cream Apply 1 application topically as needed.  30 g  0  . LORazepam (ATIVAN) 0.5 MG tablet Take 1 tablet (0.5 mg total) by mouth every 6 (six) hours as needed (Nausea or vomiting).  30 tablet  0  .  ondansetron (ZOFRAN) 8 MG tablet Take 1 tablet (8 mg total) by mouth 2 (two) times daily as needed. Start on the third day after chemotherapy.  30 tablet  1  . oxyCODONE-acetaminophen (ROXICET) 5-325 MG per tablet Take 1 tablet by mouth every 4 (four) hours as needed.  30 tablet  0  . prochlorperazine (COMPAZINE) 10 MG tablet Take 1 tablet (10 mg total) by mouth every 6 (six) hours as needed (Nausea or vomiting).  30 tablet  1  . promethazine (PHENERGAN) 25 MG tablet Take 25 mg by mouth every 6 (six) hours as needed for nausea or vomiting.      . thyroid (ARMOUR) 130 MG tablet Take 130 mg by mouth daily.      . vitamin B-12 (CYANOCOBALAMIN) 1000 MCG tablet Take 5,000 mcg by mouth daily.      Jolyne Loa Grape-Goldenseal (BERBERINE COMPLEX PO) Take 500 mg by mouth 3 (three) times daily.      . magnesium gluconate (MAGONATE) 500 MG tablet Take 400 mg by mouth Nightly.      [name]  Omega-3 Fatty Acids (FISH OIL CONCENTRATE PO) Take by mouth.       No current facility-administered medications for this visit.   Facility-Administered Medications Ordered in Other Visits  Medication Dose Route Frequency Provider Last Rate Last Dose  . cyclophosphamide (CYTOXAN) 1,340 mg in sodium chloride 0.9 % 250 mL chemo infusion  600 mg/m2 (Treatment Plan Actual) Intravenous Once Chauncey Cruel, MD      . DOXOrubicin (ADRIAMYCIN) chemo injection 134 mg  60 mg/m2 (Treatment Plan Actual) Intravenous Once Chauncey Cruel, MD      . fosaprepitant (EMEND) 150 mg in sodium chloride 0.9 % 145 mL IVPB  150 mg Intravenous Once Chauncey Cruel, MD 300 mL/hr at 10/19/13 1304 150 mg at 10/19/13 1304  . heparin lock flush 100 unit/mL  500 Units Intracatheter Once PRN Chauncey Cruel, MD      . sodium chloride 0.9 % injection 10 mL  10 mL Intracatheter PRN Chauncey Cruel, MD        OBJECTIVE: Middle-aged African American woman in no acute distress Filed Vitals:   10/19/13 1150  BP: 139/90  Pulse: 75  Temp: 98 F  (36.7 C)  Resp: 18     Body mass index is 32.78 kg/(m^2).    ECOG FS:1 - Symptomatic but completely ambulatory  Skin: warm, dry, hyperpigmentation to nail beds and palms starting HEENT: sclerae anicteric, conjunctivae pink, oropharynx clear. No thrush or mucositis.  Lymph Nodes: No cervical or supraclavicular lymphadenopathy  Lungs: clear to auscultation bilaterally, no rales, wheezes, or rhonci  Heart: regular rate and rhythm  Abdomen: round, soft, non tender, positive bowel sounds  Musculoskeletal: No focal spinal tenderness, no peripheral edema  Neuro: non focal, well oriented, positive affect  Breasts: deferred   LAB RESULTS:  CMP     Component Value Date/Time   NA 143 10/19/2013 1125   NA 140 10/12/2013 1628   K 3.5 10/19/2013 1125   K 4.3 10/12/2013 1628   CL 104 10/12/2013 1628   CO2 23 10/19/2013 1125   CO2 26 10/12/2013 1628   GLUCOSE 71 10/19/2013 1125   GLUCOSE 89 10/12/2013 1628   BUN 13.0 10/19/2013 1125   BUN 15 10/12/2013 1628   CREATININE 0.9 10/19/2013 1125   CREATININE 0.74 10/12/2013 1628   CALCIUM 10.2 10/19/2013 1125   CALCIUM 9.6 10/12/2013 1628   PROT 7.7 10/19/2013 1125   PROT 6.7 10/12/2013 1628   ALBUMIN 3.9 10/19/2013 1125   ALBUMIN 3.3* 10/12/2013 1628   AST 15 10/19/2013 1125   AST 15 10/12/2013 1628   ALT 12 10/19/2013 1125   ALT 16 10/12/2013 1628   ALKPHOS 94 10/19/2013 1125   ALKPHOS 93 10/12/2013 1628   BILITOT 0.37 10/19/2013 1125   BILITOT 0.4 10/12/2013 1628   GFRNONAA >90 08/04/2013 1245   GFRAA >90 08/04/2013 1245    I No results found for this basename: SPEP,  UPEP,   kappa and lambda light chains    Lab Results  Component Value Date   WBC 4.0 10/19/2013   NEUTROABS 2.9 10/19/2013   HGB 12.4 10/19/2013   HCT 39.2 10/19/2013   MCV 82.3 10/19/2013   PLT 213 10/19/2013      Chemistry      Component Value Date/Time   NA 143 10/19/2013 1125   NA 140 10/12/2013 1628   K 3.5 10/19/2013 1125   K 4.3 10/12/2013 1628   CL 104 10/12/2013 1628   CO2 23  10/19/2013 1125  CO2 26 10/12/2013 1628   BUN 13.0 10/19/2013 1125   BUN 15 10/12/2013 1628   CREATININE 0.9 10/19/2013 1125   CREATININE 0.74 10/12/2013 1628      Component Value Date/Time   CALCIUM 10.2 10/19/2013 1125   CALCIUM 9.6 10/12/2013 1628   ALKPHOS 94 10/19/2013 1125   ALKPHOS 93 10/12/2013 1628   AST 15 10/19/2013 1125   AST 15 10/12/2013 1628   ALT 12 10/19/2013 1125   ALT 16 10/12/2013 1628   BILITOT 0.37 10/19/2013 1125   BILITOT 0.4 10/12/2013 1628       No results found for this basename: LABCA2    No components found with this basename: LABCA125    No results found for this basename: INR,  in the last 168 hours  Urinalysis    Component Value Date/Time   COLORURINE YELLOW 10/21/2009 1440   APPEARANCEUR CLEAR 10/21/2009 1440   LABSPEC 1.012 10/21/2009 1440   PHURINE 6.0 10/21/2009 1440   GLUCOSEU NEGATIVE 10/21/2009 1440   HGBUR NEGATIVE 10/21/2009 1440   BILIRUBINUR NEGATIVE 10/21/2009 1440   KETONESUR 15* 10/21/2009 1440   PROTEINUR NEGATIVE 10/21/2009 1440   UROBILINOGEN 0.2 10/21/2009 1440   NITRITE NEGATIVE 10/21/2009 1440   LEUKOCYTESUR NEGATIVE MICROSCOPIC NOT DONE ON URINES WITH NEGATIVE PROTEIN, BLOOD, LEUKOCYTES, NITRITE, OR GLUCOSE <1000 mg/dL. 10/21/2009 1440    STUDIES: Ct Chest W Contrast  10/10/2013   CLINICAL DATA:  New diagnosis breast cancer. Chemotherapy ongoing. Left breast cancer  EXAM: CT CHEST, ABDOMEN, AND PELVIS WITH CONTRAST  TECHNIQUE: Multidetector CT imaging of the chest, abdomen and pelvis was performed following the standard protocol during bolus administration of intravenous contrast.  CONTRAST:  123m OMNIPAQUE IOHEXOL 300 MG/ML  SOLN  COMPARISON:  None.  FINDINGS: CT CHEST FINDINGS  There is a port in the right chest wall. There is no axillary lymphadenopathy. There are lymphadenectomy clips in the right axilla. No mediastinal or hilar lymphadenopathy. No pericardial fluid. No central pulmonary embolism.  Review of the lung parenchyma new  demonstrates no pulmonary nodules. Airways are normal.  CT ABDOMEN AND PELVIS FINDINGS  Hepatobiliary: No focal hepatic lesion.  Gallbladder is normal.  Spleen:  Spleen is normal.  Pancreas: Pancreas is normal.  Stomach/Bowel: Stomach, small bowel wall, appendix, and colon are normal. Moderate volume stool throughout the colon. No obstructing lesion.  Adrenals/urinary tract: Adrenal glands and kidneys are normal.  Vascular/Lymphatic: Abdominal is normal caliber. No retroperitoneal or periportal lymphadenopathy. No mesenteric adenopathy. No peritoneal implants.  Muskuloskeletal: No aggressive osseous lesion.  Other: Fibroid uterus. The ovaries are normal. No pelvic lymphadenopathy.  IMPRESSION: Chest Impression:  1. No evidence of thoracic metastasis. 2. Lymphadenectomy clips in the right axilla.  Abdomen / Pelvis Impression:  1. No evidence metastasis in the abdomen or pelvis. 2. Calcified uterine fibroids.   Electronically Signed   By: SSuzy BouchardM.Jennifer.   On: 10/10/2013 08:32   Ct Abdomen Pelvis W Contrast  10/10/2013   CLINICAL DATA:  New diagnosis breast cancer. Chemotherapy ongoing. Left breast cancer  EXAM: CT CHEST, ABDOMEN, AND PELVIS WITH CONTRAST  TECHNIQUE: Multidetector CT imaging of the chest, abdomen and pelvis was performed following the standard protocol during bolus administration of intravenous contrast.  CONTRAST:  1016mOMNIPAQUE IOHEXOL 300 MG/ML  SOLN  COMPARISON:  None.  FINDINGS: CT CHEST FINDINGS  There is a port in the right chest wall. There is no axillary lymphadenopathy. There are lymphadenectomy clips in the right axilla. No mediastinal or hilar lymphadenopathy.  No pericardial fluid. No central pulmonary embolism.  Review of the lung parenchyma new demonstrates no pulmonary nodules. Airways are normal.  CT ABDOMEN AND PELVIS FINDINGS  Hepatobiliary: No focal hepatic lesion.  Gallbladder is normal.  Spleen:  Spleen is normal.  Pancreas: Pancreas is normal.  Stomach/Bowel: Stomach,  small bowel wall, appendix, and colon are normal. Moderate volume stool throughout the colon. No obstructing lesion.  Adrenals/urinary tract: Adrenal glands and kidneys are normal.  Vascular/Lymphatic: Abdominal is normal caliber. No retroperitoneal or periportal lymphadenopathy. No mesenteric adenopathy. No peritoneal implants.  Muskuloskeletal: No aggressive osseous lesion.  Other: Fibroid uterus. The ovaries are normal. No pelvic lymphadenopathy.  IMPRESSION: Chest Impression:  1. No evidence of thoracic metastasis. 2. Lymphadenectomy clips in the right axilla.  Abdomen / Pelvis Impression:  1. No evidence metastasis in the abdomen or pelvis. 2. Calcified uterine fibroids.   Electronically Signed   By: Suzy Bouchard M.Jennifer.   On: 10/10/2013 08:32   Dg Chest Port 1 View  09/29/2013   CLINICAL DATA:  Line placement  EXAM: PORTABLE CHEST - 1 VIEW  COMPARISON:  Portable exam 0858 hr without priors for comparison  FINDINGS: RIGHT jugular power port with tip projecting over cavoatrial junction.  Normal heart size, mediastinal contours and pulmonary vascularity.  Lungs clear.  No pleural effusion or pneumothorax.  Bones unremarkable.  IMPRESSION: RIGHT jugular power port without pneumothorax.   Electronically Signed   By: Lavonia Dana M.Jennifer.   On: 09/29/2013 09:25   Dg Fluoro Guide Cv Line-no Report  09/29/2013   CLINICAL DATA: port placement   FLOURO GUIDE CV LINE  Fluoroscopy was utilized by the requesting physician.  No radiographic  interpretation.     ASSESSMENT: 38 y.o. BRCA negative [place] woman status post right breast biopsy 07/17/2013 for high-grade ductal carcinoma in situ, estrogen and progesterone receptor positive  (1) status post right lumpectomy and sentinel lymph node sampling 08/08/2013 for a pT1b pN0, stage IA invasive ductal carcinoma, grade 3, estrogen and progesterone receptor positive, with an MIB-1 of 62% and no HER-2 amplification  (2) genetics sent 08/02/2013 was normal but did  identify a variant of uncertain significance called BRCA2, [id number].  (3) Oncotype score of 23 predicts a risk of outside the breast recurrence within 10 years of 15% if the patient's only systemic treatment is tamoxifen for 5 years. Adjuvant chemotherapy was recommended  (4) doxorubicin and cyclophosphamide in dose dense fashion x4 with Neulasta support to start 10/05/2013, to be followed by paclitaxel weekly x12  (5) adjuvant radiation to follow chemotherapy  (6) anti-estrogens for 5-10 years to follow radiation  PLAN: Sharlynn feels and looks great today. The labs were reviewed in detail and were entirely stable. As mentioned previously, tylenol will be given as a premed before this dose of chemotherapy.   Talisa will return next week for her nadir visit. She understands and agrees with this plan. She knows the goal of treatment in her case is cure. She has been encouraged to call with any issues that might arise before her next visit here.   Marcelino Duster, NP   10/19/2013 1:29 PM

## 2013-10-20 ENCOUNTER — Ambulatory Visit (HOSPITAL_BASED_OUTPATIENT_CLINIC_OR_DEPARTMENT_OTHER): Payer: 59

## 2013-10-20 VITALS — BP 135/67 | HR 66 | Temp 98.0°F

## 2013-10-20 DIAGNOSIS — C50419 Malignant neoplasm of upper-outer quadrant of unspecified female breast: Secondary | ICD-10-CM

## 2013-10-20 DIAGNOSIS — Z5189 Encounter for other specified aftercare: Secondary | ICD-10-CM

## 2013-10-20 DIAGNOSIS — C50411 Malignant neoplasm of upper-outer quadrant of right female breast: Secondary | ICD-10-CM

## 2013-10-20 MED ORDER — PEGFILGRASTIM INJECTION 6 MG/0.6ML
6.0000 mg | Freq: Once | SUBCUTANEOUS | Status: AC
  Administered 2013-10-20: 6 mg via SUBCUTANEOUS
  Filled 2013-10-20: qty 0.6

## 2013-10-26 ENCOUNTER — Encounter: Payer: Self-pay | Admitting: Nurse Practitioner

## 2013-10-26 ENCOUNTER — Telehealth: Payer: Self-pay | Admitting: Nurse Practitioner

## 2013-10-26 ENCOUNTER — Other Ambulatory Visit (HOSPITAL_BASED_OUTPATIENT_CLINIC_OR_DEPARTMENT_OTHER): Payer: 59

## 2013-10-26 ENCOUNTER — Ambulatory Visit (HOSPITAL_BASED_OUTPATIENT_CLINIC_OR_DEPARTMENT_OTHER): Payer: 59 | Admitting: Nurse Practitioner

## 2013-10-26 ENCOUNTER — Ambulatory Visit: Payer: 59 | Admitting: Oncology

## 2013-10-26 VITALS — BP 136/78 | HR 70 | Temp 98.2°F | Resp 18 | Ht 69.0 in | Wt 224.6 lb

## 2013-10-26 DIAGNOSIS — G4700 Insomnia, unspecified: Secondary | ICD-10-CM

## 2013-10-26 DIAGNOSIS — D0511 Intraductal carcinoma in situ of right breast: Secondary | ICD-10-CM

## 2013-10-26 DIAGNOSIS — Z170 Estrogen receptor positive status [ER+]: Secondary | ICD-10-CM

## 2013-10-26 DIAGNOSIS — R5383 Other fatigue: Secondary | ICD-10-CM

## 2013-10-26 DIAGNOSIS — C50411 Malignant neoplasm of upper-outer quadrant of right female breast: Secondary | ICD-10-CM

## 2013-10-26 DIAGNOSIS — Z803 Family history of malignant neoplasm of breast: Secondary | ICD-10-CM

## 2013-10-26 DIAGNOSIS — D0510 Intraductal carcinoma in situ of unspecified breast: Secondary | ICD-10-CM

## 2013-10-26 DIAGNOSIS — Z800 Family history of malignant neoplasm of digestive organs: Secondary | ICD-10-CM

## 2013-10-26 LAB — CBC WITH DIFFERENTIAL/PLATELET
BASO%: 0.2 % (ref 0.0–2.0)
BASOS ABS: 0 10*3/uL (ref 0.0–0.1)
EOS ABS: 0 10*3/uL (ref 0.0–0.5)
EOS%: 2 % (ref 0.0–7.0)
HCT: 34.6 % — ABNORMAL LOW (ref 34.8–46.6)
HEMOGLOBIN: 11 g/dL — AB (ref 11.6–15.9)
LYMPH#: 0.4 10*3/uL — AB (ref 0.9–3.3)
LYMPH%: 20.8 % (ref 14.0–49.7)
MCH: 26.1 pg (ref 25.1–34.0)
MCHC: 31.8 g/dL (ref 31.5–36.0)
MCV: 82.1 fL (ref 79.5–101.0)
MONO#: 0.1 10*3/uL (ref 0.1–0.9)
MONO%: 2.8 % (ref 0.0–14.0)
NEUT%: 74.2 % (ref 38.4–76.8)
NEUTROS ABS: 1.4 10*3/uL — AB (ref 1.5–6.5)
Platelets: 104 10*3/uL — ABNORMAL LOW (ref 145–400)
RBC: 4.21 10*6/uL (ref 3.70–5.45)
RDW: 14.1 % (ref 11.2–14.5)
WBC: 1.9 10*3/uL — ABNORMAL LOW (ref 3.9–10.3)

## 2013-10-26 NOTE — Progress Notes (Signed)
Jennifer Frazier  Telephone:(336) [phone] Fax:(336) [phone]     ID: [name] DOB: 10-17-1975  MR#: [id number]  NKN#:[id number]  Patient Care Team: Sharilyn Sites, MD as PCP - General (Family Medicine) Chauncey Cruel, MD as Consulting Physician (Oncology) Thea Silversmith, MD as Consulting Physician (Radiation Oncology) [name], MD as Consulting Physician (General Surgery)   CHIEF COMPLAINT: Estrogen receptor positive stage I breast cancer  CURRENT TREATMENT: receiving adjuvant chemotherapy   BREAST CANCER HISTORY: From the original intake note:  The patient had screening mammography at [facility] showing calcifications in the right breast, and was referred to the breast Center 07/06/2013 for additional views. Right diagnostic mammography confirmed a group of amorphous calcifications in the upper outer right breast measuring 1.2 cm.  Biopsy of this area 07/17/2013 showed ([id number]) ductal carcinoma in situ, high-grade, estrogen receptor 100% positive, and progesterone receptor 96% positive, both with strong staining intensity.  The patient was then referred to surgery and on 07/25/2013 underwent bilateral breast MRI. This showed a breast density category CEA. In the right breast there was an area of clumped nodular enhancement measuring 3 cm. There was also a 1.5 cm circumscribed oval mass in the medial portion of the right breast consistent with a fibroadenoma. The left breast was unremarkable, and there were no abnormal appearing lymph nodes.  On 08/08/2013 the patient underwent right lumpectomy and sentinel lymph node sampling. The pathology ([id number] was (showed, in addition to ductal carcinoma in situ, invasive ductal carcinoma, measuring 0.7 cm, grade 3, estrogen receptor 86% positive, progesterone receptor 89% positive, both with strong staining intensity, with an MIB-1 of 62%, and no HER-2 amplification. Both sentinel lymph nodes were clear.  Margins were ample.   The patient's subsequent history is as detailed below  INTERVAL HISTORY: Jennifer Frazier returns to clinic today for follow up of her breast cancer, accompanied by her mother. Today is day 8, cycle 2 of 4 planned cycles of cyclophosphamide and doxorubicin, with neulasta on day 2 given for granulocyte support. She has a new wig and is adjusting her her bare scalp.  REVIEW OF SYSTEMS: Jennifer Frazier continues to tolerate chemo well. Her fatigue is starting to weigh her down more. She isn't sleeping well even on lorazepam, likely because of steroid use and her recent fixation on [facility]. She is trying to break her habit of sugary sodas, but her taste changes has made water taste like "rolaids." She denies mouth sores, rashes, or peripheral neuropathy symptoms. She has no nausea, vomiting, or changes in bowel or bladder habits. She has no shortness of breath, chest pain, or cough.   PAST MEDICAL HISTORY: Past Medical History  Diagnosis Date  . Fibroids     uterine fibroids  . Anemia   . Thyroid disease   . Contact lens/glasses fitting     wears contacts or glasses  . Sleep apnea     has a cpap-does not always use it    PAST SURGICAL HISTORY: Past Surgical History  Procedure Laterality Date  . [place]  2012    urerine ablasion  . Eye surgery  2013    retinal tear-lazer-both  . Wisdom tooth extraction    . Breast surgery      lumpectomy 7/14  . Portacath placement Right 09/29/2013    Procedure: INSERTION PORT-A-CATH WITH ULTRA SOUND;  Surgeon: [name], MD;  Location: [facility];  Service: General;  Laterality: Right;    FAMILY HISTORY Family History  Problem Relation Age of Onset  . Cancer Father 45    colon  . Cancer Maternal Aunt 50    breast cancer  . Cancer Paternal Aunt 48    breast cancer  . Cancer Maternal Grandmother 39    ovarian or uterine cancer  . Cancer Paternal Grandmother     unknown primary   the patient's father died at the age of  23, been diagnosed with colon cancer at the age of 16. The patient's mother is living, currently 23 years old. The patient has one brother, no sisters. One of the patient's mother's sisters was diagnosed with breast cancer at the age of 55 in one of the patient's father's mother was diagnosed with breast cancer at the age of 69. There is no history of ovarian cancer in the family.  GYNECOLOGIC HISTORY:  No LMP recorded. Menarche age 49, for the patient is GX P0. She still having regular periods. 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    ADVANCED DIRECTIVES: Not in place; at the 08/24/2013 visit the patient was given the appropriate documents to come feel notarize associate may declare healthcare power of attorney.   HEALTH MAINTENANCE: History  Substance Use Topics  . Smoking status: Never Smoker   . Smokeless tobacco: Not on file  . Alcohol Use: No     Colonoscopy:  PAP:  Bone density:  Lipid panel:  Allergies  Allergen Reactions  . Gadolinium      Desc: NAUSEA WITH MRI CONTRAST-NO VOMITING   . Tramadol Hives and Nausea Only    Current Outpatient Prescriptions  Medication Sig Dispense Refill  . Cholecalciferol (VITAMIN D3) 3000 UNITS TABS Take 10,000 Units by mouth daily.      [name] dexamethasone (DECADRON) 4 MG tablet Take 2 tablets by mouth once a day on the day after chemotherapy and then take 2 tablets two times a day for 2 days. Take with food.  30 tablet  1  . ibuprofen (ADVIL,MOTRIN) 100 MG/5ML suspension Take 200 mg by mouth every 4 (four) hours as needed.      . lidocaine-prilocaine (EMLA) cream Apply 1 application topically as needed.  30 g  0  . LORazepam (ATIVAN) 0.5 MG tablet Take 1 tablet (0.5 mg total) by mouth every 6 (six)  hours as needed (Nausea or vomiting).  30 tablet  0  . ondansetron (ZOFRAN) 8 MG tablet Take 1 tablet (8 mg total) by mouth 2 (two) times daily as needed. Start on the third day after chemotherapy.  30 tablet  1  . prochlorperazine (COMPAZINE) 10 MG tablet Take 1 tablet (10 mg total) by mouth every 6 (six) hours as needed (Nausea or vomiting).  30 tablet  1  . promethazine (PHENERGAN) 25 MG tablet Take 25 mg by mouth every 6 (six) hours as needed for nausea or vomiting.      . thyroid (ARMOUR) 130 MG tablet Take 130 mg by mouth daily.      . vitamin B-12 (CYANOCOBALAMIN) 1000 MCG tablet Take 5,000 mcg by mouth daily.       No current facility-administered medications for this visit.    OBJECTIVE: Middle-aged [place] American woman in no acute distress Filed Vitals:   10/26/13 1431  BP: 136/78  Pulse: 70  Temp: 98.2 F (36.8 C)  Resp: 18     Body mass index is 33.15 kg/(m^2).    ECOG FS:1 - Symptomatic but completely ambulatory  Sclerae unicteric, pupils equal and reactive Oropharynx clear and moist-- no thrush No cervical or supraclavicular adenopathy Lungs no rales or rhonchi Heart regular rate and rhythm Abd soft, nontender, positive bowel sounds MSK no focal spinal tenderness, no upper extremity lymphedema Neuro: nonfocal, well oriented, appropriate affect Breasts: deferred  LAB RESULTS:  CMP     Component Value Date/Time   NA 143 10/19/2013 1125   NA 140 10/12/2013 1628   K 3.5 10/19/2013 1125   K 4.3 10/12/2013 1628   CL 104 10/12/2013 1628   CO2 23 10/19/2013 1125   CO2 26 10/12/2013 1628   GLUCOSE 71 10/19/2013 1125   GLUCOSE 89 10/12/2013 1628   BUN 13.0 10/19/2013 1125   BUN 15 10/12/2013 1628   CREATININE 0.9 10/19/2013 1125   CREATININE 0.74 10/12/2013 1628   CALCIUM 10.2 10/19/2013 1125   CALCIUM 9.6 10/12/2013 1628   PROT 7.7 10/19/2013 1125   PROT 6.7 10/12/2013 1628   ALBUMIN 3.9 10/19/2013 1125   ALBUMIN 3.3* 10/12/2013 1628   AST 15 10/19/2013 1125   AST 15  10/12/2013 1628   ALT 12 10/19/2013 1125   ALT 16 10/12/2013 1628   ALKPHOS 94 10/19/2013 1125   ALKPHOS 93 10/12/2013 1628   BILITOT 0.37 10/19/2013 1125   BILITOT 0.4 10/12/2013 1628   GFRNONAA >90 08/04/2013 1245   GFRAA >90 08/04/2013 1245    I No results found for this basename: SPEP,  UPEP,   kappa and lambda light chains    Lab Results  Component Value Date   WBC 1.9* 10/26/2013   NEUTROABS 1.4* 10/26/2013   HGB 11.0* 10/26/2013   HCT 34.6* 10/26/2013   MCV 82.1 10/26/2013   PLT 104* 10/26/2013      Chemistry      Component Value Date/Time   NA 143 10/19/2013 1125   NA 140 10/12/2013 1628   K 3.5 10/19/2013 1125   K 4.3 10/12/2013 1628   CL 104 10/12/2013 1628   CO2 23 10/19/2013 1125   CO2 26 10/12/2013 1628   BUN 13.0 10/19/2013 1125   BUN 15 10/12/2013 1628   CREATININE 0.9 10/19/2013 1125   CREATININE 0.74 10/12/2013 1628      Component Value Date/Time   CALCIUM 10.2 10/19/2013 1125   CALCIUM 9.6 10/12/2013 1628   ALKPHOS 94 10/19/2013 1125   ALKPHOS 93 10/12/2013 1628   AST 15 10/19/2013 1125   AST 15 10/12/2013 1628   ALT 12 10/19/2013 1125   ALT 16 10/12/2013 1628   BILITOT 0.37 10/19/2013 1125   BILITOT 0.4 10/12/2013 1628       No results found for this basename: LABCA2    No components found with this basename: LABCA125    No results found for this basename: INR,  in the last 168 hours  Urinalysis    Component Value Date/Time   COLORURINE YELLOW 10/21/2009 1440   APPEARANCEUR CLEAR 10/21/2009 1440   LABSPEC 1.012 10/21/2009 1440   PHURINE 6.0 10/21/2009 1440   GLUCOSEU NEGATIVE 10/21/2009 1440   HGBUR NEGATIVE 10/21/2009 1440   BILIRUBINUR NEGATIVE 10/21/2009 1440   KETONESUR 15* 10/21/2009 1440   PROTEINUR NEGATIVE 10/21/2009 1440   UROBILINOGEN 0.2 10/21/2009 1440   NITRITE NEGATIVE 10/21/2009 1440   LEUKOCYTESUR NEGATIVE MICROSCOPIC NOT DONE ON URINES WITH NEGATIVE PROTEIN, BLOOD, LEUKOCYTES, NITRITE, OR GLUCOSE <1000 mg/dL. 10/21/2009  1440    STUDIES: Ct Chest W  Contrast  10/10/2013   CLINICAL DATA:  New diagnosis breast cancer. Chemotherapy ongoing. Left breast cancer  EXAM: CT CHEST, ABDOMEN, AND PELVIS WITH CONTRAST  TECHNIQUE: Multidetector CT imaging of the chest, abdomen and pelvis was performed following the standard protocol during bolus administration of intravenous contrast.  CONTRAST:  198m OMNIPAQUE IOHEXOL 300 MG/ML  SOLN  COMPARISON:  None.  FINDINGS: CT CHEST FINDINGS  There is a port in the right chest wall. There is no axillary lymphadenopathy. There are lymphadenectomy clips in the right axilla. No mediastinal or hilar lymphadenopathy. No pericardial fluid. No central pulmonary embolism.  Review of the lung parenchyma new demonstrates no pulmonary nodules. Airways are normal.  CT ABDOMEN AND PELVIS FINDINGS  Hepatobiliary: No focal hepatic lesion.  Gallbladder is normal.  Spleen:  Spleen is normal.  Pancreas: Pancreas is normal.  Stomach/Bowel: Stomach, small bowel wall, appendix, and colon are normal. Moderate volume stool throughout the colon. No obstructing lesion.  Adrenals/urinary tract: Adrenal glands and kidneys are normal.  Vascular/Lymphatic: Abdominal is normal caliber. No retroperitoneal or periportal lymphadenopathy. No mesenteric adenopathy. No peritoneal implants.  Muskuloskeletal: No aggressive osseous lesion.  Other: Fibroid uterus. The ovaries are normal. No pelvic lymphadenopathy.  IMPRESSION: Chest Impression:  1. No evidence of thoracic metastasis. 2. Lymphadenectomy clips in the right axilla.  Abdomen / Pelvis Impression:  1. No evidence metastasis in the abdomen or pelvis. 2. Calcified uterine fibroids.   Electronically Signed   By: SSuzy BouchardM.D.   On: 10/10/2013 08:32   Ct Abdomen Pelvis W Contrast  10/10/2013   CLINICAL DATA:  New diagnosis breast cancer. Chemotherapy ongoing. Left breast cancer  EXAM: CT CHEST, ABDOMEN, AND PELVIS WITH CONTRAST  TECHNIQUE: Multidetector CT imaging of the chest, abdomen and pelvis was  performed following the standard protocol during bolus administration of intravenous contrast.  CONTRAST:  1049mOMNIPAQUE IOHEXOL 300 MG/ML  SOLN  COMPARISON:  None.  FINDINGS: CT CHEST FINDINGS  There is a port in the right chest wall. There is no axillary lymphadenopathy. There are lymphadenectomy clips in the right axilla. No mediastinal or hilar lymphadenopathy. No pericardial fluid. No central pulmonary embolism.  Review of the lung parenchyma new demonstrates no pulmonary nodules. Airways are normal.  CT ABDOMEN AND PELVIS FINDINGS  Hepatobiliary: No focal hepatic lesion.  Gallbladder is normal.  Spleen:  Spleen is normal.  Pancreas: Pancreas is normal.  Stomach/Bowel: Stomach, small bowel wall, appendix, and colon are normal. Moderate volume stool throughout the colon. No obstructing lesion.  Adrenals/urinary tract: Adrenal glands and kidneys are normal.  Vascular/Lymphatic: Abdominal is normal caliber. No retroperitoneal or periportal lymphadenopathy. No mesenteric adenopathy. No peritoneal implants.  Muskuloskeletal: No aggressive osseous lesion.  Other: Fibroid uterus. The ovaries are normal. No pelvic lymphadenopathy.  IMPRESSION: Chest Impression:  1. No evidence of thoracic metastasis. 2. Lymphadenectomy clips in the right axilla.  Abdomen / Pelvis Impression:  1. No evidence metastasis in the abdomen or pelvis. 2. Calcified uterine fibroids.   Electronically Signed   By: StSuzy Bouchard.D.   On: 10/10/2013 08:32   Dg Chest Port 1 View  09/29/2013   CLINICAL DATA:  Line placement  EXAM: PORTABLE CHEST - 1 VIEW  COMPARISON:  Portable exam 0858 hr without priors for comparison  FINDINGS: RIGHT jugular power port with tip projecting over cavoatrial junction.  Normal heart size, mediastinal contours and pulmonary vascularity.  Lungs clear.  No pleural effusion or pneumothorax.  Bones unremarkable.  IMPRESSION: RIGHT jugular power port without pneumothorax.   Electronically Signed   By: Lavonia Dana  M.D.   On: 09/29/2013 09:25   Dg Fluoro Guide Cv Line-no Report  09/29/2013   CLINICAL DATA: port placement   FLOURO GUIDE CV LINE  Fluoroscopy was utilized by the requesting physician.  No radiographic  interpretation.     ASSESSMENT: 38 y.o. BRCA negative  woman status post right breast biopsy 07/17/2013 for high-grade ductal carcinoma in situ, estrogen and progesterone receptor positive  (1) status post right lumpectomy and sentinel lymph node sampling 08/08/2013 for a pT1b pN0, stage IA invasive ductal carcinoma, grade 3, estrogen and progesterone receptor positive, with an MIB-1 of 62% and no HER-2 amplification  (2) genetics sent 08/02/2013 was normal but did identify a variant of uncertain significance called BRCA2, [id number].  (3) Oncotype score of 23 predicts a risk of outside the breast recurrence within 10 years of 15% if the patient's only systemic treatment is tamoxifen for 5 years. Adjuvant chemotherapy was recommended  (4) doxorubicin and cyclophosphamide in dose dense fashion x4 with Neulasta support to start 10/05/2013, to be followed by paclitaxel weekly x12  (5) adjuvant radiation to follow chemotherapy  (6) anti-estrogens for 5-10 years to follow radiation  PLAN: Jennifer Frazier continues to do a fantastic job tolerating her chemo. The labs were reviewed in detail and were entirely stable. Jennifer Frazier will cut her steroid dose in half next week to see if she will be able to sleep better, as she has not experienced any nausea whatsoever.   Jennifer Frazier will return next week for the start of cycle 3. She understands and agrees with this plan. She knows the goal of treatment in her case is cure. She has been encouraged to call with any issues that might arise before her next visit here.   Marcelino Duster, NP   10/26/2013 2:46 PM

## 2013-10-26 NOTE — Telephone Encounter (Signed)
per pof to sch appt-gafe pt copy of sch

## 2013-11-02 ENCOUNTER — Other Ambulatory Visit: Payer: Self-pay | Admitting: *Deleted

## 2013-11-02 ENCOUNTER — Other Ambulatory Visit (HOSPITAL_BASED_OUTPATIENT_CLINIC_OR_DEPARTMENT_OTHER): Payer: 59

## 2013-11-02 ENCOUNTER — Ambulatory Visit (HOSPITAL_BASED_OUTPATIENT_CLINIC_OR_DEPARTMENT_OTHER): Payer: 59

## 2013-11-02 ENCOUNTER — Encounter: Payer: Self-pay | Admitting: Nurse Practitioner

## 2013-11-02 ENCOUNTER — Ambulatory Visit (HOSPITAL_COMMUNITY)
Admission: RE | Admit: 2013-11-02 | Discharge: 2013-11-02 | Disposition: A | Discharge status: Home / Self Care | Payer: 59 | Source: Ambulatory Visit | Attending: Oncology | Admitting: Oncology

## 2013-11-02 ENCOUNTER — Ambulatory Visit (HOSPITAL_BASED_OUTPATIENT_CLINIC_OR_DEPARTMENT_OTHER): Payer: 59 | Admitting: Nurse Practitioner

## 2013-11-02 ENCOUNTER — Other Ambulatory Visit: Payer: Self-pay | Admitting: Oncology

## 2013-11-02 VITALS — BP 139/78 | HR 66 | Temp 98.6°F | Resp 18 | Ht 69.0 in | Wt 224.2 lb

## 2013-11-02 DIAGNOSIS — C50411 Malignant neoplasm of upper-outer quadrant of right female breast: Secondary | ICD-10-CM

## 2013-11-02 DIAGNOSIS — Z800 Family history of malignant neoplasm of digestive organs: Secondary | ICD-10-CM

## 2013-11-02 DIAGNOSIS — Z803 Family history of malignant neoplasm of breast: Secondary | ICD-10-CM

## 2013-11-02 DIAGNOSIS — Z170 Estrogen receptor positive status [ER+]: Secondary | ICD-10-CM

## 2013-11-02 DIAGNOSIS — Z5111 Encounter for antineoplastic chemotherapy: Secondary | ICD-10-CM

## 2013-11-02 DIAGNOSIS — D0510 Intraductal carcinoma in situ of unspecified breast: Secondary | ICD-10-CM

## 2013-11-02 LAB — CBC WITH DIFFERENTIAL/PLATELET
BASO%: 0.5 % (ref 0.0–2.0)
Basophils Absolute: 0 10*3/uL (ref 0.0–0.1)
EOS ABS: 0 10*3/uL (ref 0.0–0.5)
EOS%: 0.2 % (ref 0.0–7.0)
HCT: 35.6 % (ref 34.8–46.6)
HEMOGLOBIN: 11.7 g/dL (ref 11.6–15.9)
LYMPH%: 14.4 % (ref 14.0–49.7)
MCH: 26.8 pg (ref 25.1–34.0)
MCHC: 32.9 g/dL (ref 31.5–36.0)
MCV: 81.7 fL (ref 79.5–101.0)
MONO#: 0.4 10*3/uL (ref 0.1–0.9)
MONO%: 9.1 % (ref 0.0–14.0)
NEUT#: 3.2 10*3/uL (ref 1.5–6.5)
NEUT%: 75.8 % (ref 38.4–76.8)
PLATELETS: 168 10*3/uL (ref 145–400)
RBC: 4.36 10*6/uL (ref 3.70–5.45)
RDW: 14.9 % — ABNORMAL HIGH (ref 11.2–14.5)
WBC: 4.2 10*3/uL (ref 3.9–10.3)
lymph#: 0.6 10*3/uL — ABNORMAL LOW (ref 0.9–3.3)
nRBC: 1 % — ABNORMAL HIGH (ref 0–0)

## 2013-11-02 LAB — COMPREHENSIVE METABOLIC PANEL (CC13)
ALT: 13 U/L (ref 0–55)
ANION GAP: 5 meq/L (ref 3–11)
AST: 13 U/L (ref 5–34)
Albumin: 3.5 g/dL (ref 3.5–5.0)
Alkaline Phosphatase: 86 U/L (ref 40–150)
BUN: 10.5 mg/dL (ref 7.0–26.0)
CO2: 27 meq/L (ref 22–29)
CREATININE: 0.9 mg/dL (ref 0.6–1.1)
Calcium: 10 mg/dL (ref 8.4–10.4)
Chloride: 108 mEq/L (ref 98–109)
GLUCOSE: 85 mg/dL (ref 70–140)
Potassium: 3.7 mEq/L (ref 3.5–5.1)
Sodium: 141 mEq/L (ref 136–145)
Total Bilirubin: 0.28 mg/dL (ref 0.20–1.20)
Total Protein: 7.1 g/dL (ref 6.4–8.3)

## 2013-11-02 MED ORDER — PALONOSETRON HCL INJECTION 0.25 MG/5ML
INTRAVENOUS | Status: AC
  Filled 2013-11-02: qty 5

## 2013-11-02 MED ORDER — SODIUM CHLORIDE 0.9 % IV SOLN
600.0000 mg/m2 | Freq: Once | INTRAVENOUS | Status: AC
  Administered 2013-11-02: 1340 mg via INTRAVENOUS
  Filled 2013-11-02: qty 67

## 2013-11-02 MED ORDER — SODIUM CHLORIDE 0.9 % IV SOLN
150.0000 mg | Freq: Once | INTRAVENOUS | Status: AC
  Administered 2013-11-02: 150 mg via INTRAVENOUS
  Filled 2013-11-02: qty 5

## 2013-11-02 MED ORDER — ACETAMINOPHEN 325 MG PO TABS
650.0000 mg | ORAL_TABLET | Freq: Once | ORAL | Status: AC
  Administered 2013-11-02: 650 mg via ORAL

## 2013-11-02 MED ORDER — DEXAMETHASONE SODIUM PHOSPHATE 20 MG/5ML IJ SOLN
12.0000 mg | Freq: Once | INTRAMUSCULAR | Status: AC
  Administered 2013-11-02: 12 mg via INTRAVENOUS

## 2013-11-02 MED ORDER — PALONOSETRON HCL INJECTION 0.25 MG/5ML
0.2500 mg | Freq: Once | INTRAVENOUS | Status: AC
  Administered 2013-11-02: 0.25 mg via INTRAVENOUS

## 2013-11-02 MED ORDER — DEXAMETHASONE SODIUM PHOSPHATE 20 MG/5ML IJ SOLN
INTRAMUSCULAR | Status: AC
  Filled 2013-11-02: qty 5

## 2013-11-02 MED ORDER — SODIUM CHLORIDE 0.9 % IV SOLN
Freq: Once | INTRAVENOUS | Status: AC
  Administered 2013-11-02: 15:00:00 via INTRAVENOUS

## 2013-11-02 MED ORDER — ACETAMINOPHEN 325 MG PO TABS
ORAL_TABLET | ORAL | Status: AC
  Filled 2013-11-02: qty 2

## 2013-11-02 MED ORDER — DOXORUBICIN HCL CHEMO IV INJECTION 2 MG/ML
60.0000 mg/m2 | Freq: Once | INTRAVENOUS | Status: AC
  Administered 2013-11-02: 134 mg via INTRAVENOUS
  Filled 2013-11-02: qty 67

## 2013-11-02 NOTE — Patient Instructions (Signed)
[facility] Discharge Instructions for Patients Receiving Chemotherapy  Today you received the following chemotherapy agents Adriamycin, Cytoxan  To help prevent nausea and vomiting after your treatment, we encourage you to take your nausea medication as directed.   If you develop nausea and vomiting that is not controlled by your nausea medication, call the clinic.   BELOW ARE SYMPTOMS THAT SHOULD BE REPORTED IMMEDIATELY:  *FEVER GREATER THAN 100.5 F  *CHILLS WITH OR WITHOUT FEVER  NAUSEA AND VOMITING THAT IS NOT CONTROLLED WITH YOUR NAUSEA MEDICATION  *UNUSUAL SHORTNESS OF BREATH  *UNUSUAL BRUISING OR BLEEDING  TENDERNESS IN MOUTH AND THROAT WITH OR WITHOUT PRESENCE OF ULCERS  *URINARY PROBLEMS  *BOWEL PROBLEMS  UNUSUAL RASH Items with * indicate a potential emergency and should be followed up as soon as possible.  Feel free to call the clinic you have any questions or concerns. The clinic phone number is [phone].    

## 2013-11-02 NOTE — Progress Notes (Signed)
1345 Pt back from radiology.

## 2013-11-02 NOTE — Progress Notes (Signed)
This RN contacted radiology per need to do dye study per reading of port stating " in normal position "  Note nurses in treatment room could not access and feel port may have " flipped"- as well as noted movement of port when palpating. Pt states she feels a " fullness in my neck especially when I get up in the morning ".  This RN assessed site and palpated port site and noted movement possible with gentle manipulation. Also noted port felt as a flat triangle vs usual geographic of round port with palpable soft inner port.  Per discussion with MD and review of actual films and pt receiving vesicant drug- need to obtain dye study for further evaluation.  Order obtained and scheduled.  Pt made aware of above.

## 2013-11-02 NOTE — Progress Notes (Signed)
Jennifer Frazier  [phone] [phone]     ID: Jennifer Frazier DOB: 05-Oct-1975  MR#: [id number]  RAQ#:[id number]  Patient Care Team: Sharilyn Sites, MD as PCP - General (Family Medicine) Chauncey Cruel, MD as Consulting Physician (Oncology) Thea Silversmith, MD as Consulting Physician (Radiation Oncology) [name], MD as Consulting Physician (General Surgery)   CHIEF COMPLAINT: Estrogen receptor positive stage I breast cancer  CURRENT TREATMENT: receiving adjuvant chemotherapy   BREAST CANCER HISTORY: From the original intake note:  The patient had screening mammography at [facility] showing calcifications in the right breast, and was referred to the breast Center 07/06/2013 for additional views. Right diagnostic mammography confirmed a group of amorphous calcifications in the upper outer right breast measuring 1.2 cm.  Biopsy of this area 07/17/2013 showed ([id number]) ductal carcinoma in situ, high-grade, estrogen receptor 100% positive, and progesterone receptor 96% positive, both with strong staining intensity.  The patient was then referred to surgery and on 07/25/2013 underwent bilateral breast MRI. This showed a breast density category CEA. In the right breast there was an area of clumped nodular enhancement measuring 3 cm. There was also a 1.5 cm circumscribed oval mass in the medial portion of the right breast consistent with a fibroadenoma. The left breast was unremarkable, and there were no abnormal appearing lymph nodes.  On 08/08/2013 the patient underwent right lumpectomy and sentinel lymph node sampling. The pathology ([id number] was (showed, in addition to ductal carcinoma in situ, invasive ductal carcinoma, measuring 0.7 cm, grade 3, estrogen receptor 86% positive, progesterone receptor 89% positive, both with strong staining intensity, with an MIB-1 of 62%, and no HER-2 amplification. Both sentinel lymph nodes were clear.  Margins were ample.   The patient's subsequent history is as detailed below  INTERVAL HISTORY: Jennifer Frazier returns to clinic today for follow up of her breast cancer, accompanied by her mother. Today is day 1, cycle 3 of 4 planned cycles of cyclophosphamide and doxorubicin, with neulasta on day 2 given for granulocyte support.   REVIEW OF SYSTEMS: Jennifer Frazier is tolerating her treatments well. She continues to have insomnia on some nights, lorazepam isn't always helpful. She has some fatigue, but it is manageable. She denies fevers, chills, nausea, vomiting, or changes in bowel or bladder habits. She has no mouth sores or rashes. She has no shortness of breath, chest pain, or cough. A detailed review of systems is otherwise noncontributory.   PAST MEDICAL HISTORY: Past Medical History  Diagnosis Date  . Fibroids     uterine fibroids  . Anemia   . Thyroid disease   . Contact lens/glasses fitting     wears contacts or glasses  . Sleep apnea     has a cpap-does not always use it    PAST SURGICAL HISTORY: Past Surgical History  Procedure Laterality Date  . [place]  2012    urerine ablasion  . Eye surgery  2013    retinal tear-lazer-both  . Wisdom tooth extraction    . Breast surgery      lumpectomy 7/14  . Portacath placement Right 09/29/2013    Procedure: INSERTION PORT-A-CATH WITH ULTRA SOUND;  Surgeon: [name], MD;  Location: [facility];  Service: General;  Laterality: Right;    FAMILY HISTORY Family History  Problem Relation Age of Onset  . Cancer Father 91    colon  . Cancer Maternal Aunt 50    breast cancer  . Cancer Paternal Aunt 64  breast cancer  . Cancer Maternal Grandmother 41    ovarian or uterine cancer  . Cancer Paternal Grandmother     unknown primary   the patient's father died at the age of 58, been diagnosed with colon cancer at the age of 62. The patient's mother is living, currently 30 years old. The patient has one brother, no sisters. One of  the patient's mother's sisters was diagnosed with breast cancer at the age of 34 in one of the patient's father's mother was diagnosed with breast cancer at the age of 23. There is no history of ovarian cancer in the family.  GYNECOLOGIC HISTORY:  Patient's last menstrual period was 10/08/2013. Menarche age 45, for the patient is GX P0. She still having regular periods. 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    ADVANCED DIRECTIVES: Not in place; at the 08/24/2013 visit the patient was given the appropriate documents to come feel notarize associate may declare healthcare power of attorney.   HEALTH MAINTENANCE: History  Substance Use Topics  . Smoking status: Never Smoker   . Smokeless tobacco: Not on file  . Alcohol Use: No     Colonoscopy:  PAP:  Bone density:  Lipid panel:  Allergies  Allergen Reactions  . Gadolinium      Desc: NAUSEA WITH MRI CONTRAST-NO VOMITING   . Tramadol Hives and Nausea Only    Current Outpatient Prescriptions  Medication Sig Dispense Refill  . Cholecalciferol (VITAMIN D3) 3000 UNITS TABS Take 10,000 Units by mouth daily.      [name] dexamethasone (DECADRON) 4 MG tablet Take 2 tablets by mouth once a day on the day after chemotherapy and then take 2 tablets two times a day for 2 days. Take with food.  30 tablet  1  . ibuprofen (ADVIL,MOTRIN) 100 MG/5ML suspension Take 200 mg by mouth every 4 (four) hours as needed.      . lidocaine-prilocaine (EMLA) cream Apply 1 application topically as needed.  30 g  0  . LORazepam (ATIVAN) 0.5 MG tablet Take 1 tablet (0.5 mg total) by mouth every 6 (six) hours as needed (Nausea or vomiting).  30 tablet  0  . ondansetron (ZOFRAN) 8 MG tablet Take 1 tablet (8 mg total) by mouth 2 (two)  times daily as needed. Start on the third day after chemotherapy.  30 tablet  1  . prochlorperazine (COMPAZINE) 10 MG tablet Take 1 tablet (10 mg total) by mouth every 6 (six) hours as needed (Nausea or vomiting).  30 tablet  1  . promethazine (PHENERGAN) 25 MG tablet Take 25 mg by mouth every 6 (six) hours as needed for nausea or vomiting.      . thyroid (ARMOUR) 130 MG tablet Take 130 mg by mouth daily.      . vitamin B-12 (CYANOCOBALAMIN) 1000 MCG tablet Take 5,000 mcg by mouth daily.       No current facility-administered medications for this visit.   Facility-Administered Medications Ordered in Other Visits  Medication Dose Route Frequency Provider Last Rate Last Dose  . cyclophosphamide (CYTOXAN) 1,340 mg in sodium chloride 0.9 % 250 mL chemo infusion  600 mg/m2 (Treatment Plan Actual) Intravenous Once Chauncey Cruel, MD      . DOXOrubicin (ADRIAMYCIN) chemo injection 134 mg  60 mg/m2 (Treatment  Plan Actual) Intravenous Once Chauncey Cruel, MD        OBJECTIVE: Middle-aged African American woman in no acute distress Filed Vitals:   11/02/13 1156  BP: 139/78  Pulse: 66  Temp: 98.6 F (37 C)  Resp: 18     Body mass index is 33.09 kg/(m^2).    ECOG FS:1 - Symptomatic but completely ambulatory  Skin: warm, dry  HEENT: sclerae anicteric, conjunctivae pink, oropharynx clear. No thrush or mucositis.  Lymph Nodes: No cervical or supraclavicular lymphadenopathy  Lungs: clear to auscultation bilaterally, no rales, wheezes, or rhonci  Heart: regular rate and rhythm  Abdomen: round, soft, non tender, positive bowel sounds  Musculoskeletal: No focal spinal tenderness, no peripheral edema  Neuro: non focal, well oriented, positive affect  Breasts: deferred  LAB RESULTS:  CMP     Component Value Date/Time   NA 141 11/02/2013 1126   NA 140 10/12/2013 1628   K 3.7 11/02/2013 1126   K 4.3 10/12/2013 1628   CL 104 10/12/2013 1628   CO2 27 11/02/2013 1126   CO2 26 10/12/2013 1628    GLUCOSE 85 11/02/2013 1126   GLUCOSE 89 10/12/2013 1628   BUN 10.5 11/02/2013 1126   BUN 15 10/12/2013 1628   CREATININE 0.9 11/02/2013 1126   CREATININE 0.74 10/12/2013 1628   CALCIUM 10.0 11/02/2013 1126   CALCIUM 9.6 10/12/2013 1628   PROT 7.1 11/02/2013 1126   PROT 6.7 10/12/2013 1628   ALBUMIN 3.5 11/02/2013 1126   ALBUMIN 3.3* 10/12/2013 1628   AST 13 11/02/2013 1126   AST 15 10/12/2013 1628   ALT 13 11/02/2013 1126   ALT 16 10/12/2013 1628   ALKPHOS 86 11/02/2013 1126   ALKPHOS 93 10/12/2013 1628   BILITOT 0.28 11/02/2013 1126   BILITOT 0.4 10/12/2013 1628   GFRNONAA >90 08/04/2013 1245   GFRAA >90 08/04/2013 1245    I No results found for this basename: SPEP,  UPEP,   kappa and lambda light chains    Lab Results  Component Value Date   WBC 4.2 11/02/2013   NEUTROABS 3.2 11/02/2013   HGB 11.7 11/02/2013   HCT 35.6 11/02/2013   MCV 81.7 11/02/2013   PLT 168 11/02/2013      Chemistry      Component Value Date/Time   NA 141 11/02/2013 1126   NA 140 10/12/2013 1628   K 3.7 11/02/2013 1126   K 4.3 10/12/2013 1628   CL 104 10/12/2013 1628   CO2 27 11/02/2013 1126   CO2 26 10/12/2013 1628   BUN 10.5 11/02/2013 1126   BUN 15 10/12/2013 1628   CREATININE 0.9 11/02/2013 1126   CREATININE 0.74 10/12/2013 1628      Component Value Date/Time   CALCIUM 10.0 11/02/2013 1126   CALCIUM 9.6 10/12/2013 1628   ALKPHOS 86 11/02/2013 1126   ALKPHOS 93 10/12/2013 1628   AST 13 11/02/2013 1126   AST 15 10/12/2013 1628   ALT 13 11/02/2013 1126   ALT 16 10/12/2013 1628   BILITOT 0.28 11/02/2013 1126   BILITOT 0.4 10/12/2013 1628       No results found for this basename: LABCA2    No components found with this basename: LABCA125    No results found for this basename: INR,  in the last 168 hours  Urinalysis    Component Value Date/Time   COLORURINE YELLOW 10/21/2009 1440   APPEARANCEUR CLEAR 10/21/2009 1440   LABSPEC 1.012 10/21/2009 1440   PHURINE 6.0 10/21/2009 1440  GLUCOSEU NEGATIVE 10/21/2009 1440    HGBUR NEGATIVE 10/21/2009 1440   BILIRUBINUR NEGATIVE 10/21/2009 1440   KETONESUR 15* 10/21/2009 1440   PROTEINUR NEGATIVE 10/21/2009 1440   UROBILINOGEN 0.2 10/21/2009 1440   NITRITE NEGATIVE 10/21/2009 1440   LEUKOCYTESUR NEGATIVE MICROSCOPIC NOT DONE ON URINES WITH NEGATIVE PROTEIN, BLOOD, LEUKOCYTES, NITRITE, OR GLUCOSE <1000 mg/dL. 10/21/2009 1440    STUDIES: Dg Chest 2 View  11/02/2013   CLINICAL DATA:  Evaluation of Port-A-Cath.  Breast cancer.  EXAM: CHEST  2 VIEW  COMPARISON:  Chest CT 10/10/2013 chest radiograph 09/29/2013 ring and  FINDINGS: Right-sided IJ power Port-A-Cath is present. The distal tip projects over the expected location of the mid superior vena cava on the frontal and lateral views. The Port-A-Cath appears appropriately positioned. Heart and mediastinal contours are stable and within normal limits. Surgical clips project over the right breast and right axillary region. Negative for pleural effusion or pneumothorax. Negative for airspace disease.  Stable mild convex right scoliosis of the mid to lower thoracic spine.  IMPRESSION: Right IJ power Port-A-Cath appears appropriately positioned, and projects over the mid superior vena cava.  No acute cardiopulmonary disease.   Electronically Signed   By: Curlene Dolphin M.D.   On: 11/02/2013 14:47   Ct Chest W Contrast  10/10/2013   CLINICAL DATA:  New diagnosis breast cancer. Chemotherapy ongoing. Left breast cancer  EXAM: CT CHEST, ABDOMEN, AND PELVIS WITH CONTRAST  TECHNIQUE: Multidetector CT imaging of the chest, abdomen and pelvis was performed following the standard protocol during bolus administration of intravenous contrast.  CONTRAST:  159m OMNIPAQUE IOHEXOL 300 MG/ML  SOLN  COMPARISON:  None.  FINDINGS: CT CHEST FINDINGS  There is a port in the right chest wall. There is no axillary lymphadenopathy. There are lymphadenectomy clips in the right axilla. No mediastinal or hilar lymphadenopathy. No pericardial fluid. No central  pulmonary embolism.  Review of the lung parenchyma new demonstrates no pulmonary nodules. Airways are normal.  CT ABDOMEN AND PELVIS FINDINGS  Hepatobiliary: No focal hepatic lesion.  Gallbladder is normal.  Spleen:  Spleen is normal.  Pancreas: Pancreas is normal.  Stomach/Bowel: Stomach, small bowel wall, appendix, and colon are normal. Moderate volume stool throughout the colon. No obstructing lesion.  Adrenals/urinary tract: Adrenal glands and kidneys are normal.  Vascular/Lymphatic: Abdominal is normal caliber. No retroperitoneal or periportal lymphadenopathy. No mesenteric adenopathy. No peritoneal implants.  Muskuloskeletal: No aggressive osseous lesion.  Other: Fibroid uterus. The ovaries are normal. No pelvic lymphadenopathy.  IMPRESSION: Chest Impression:  1. No evidence of thoracic metastasis. 2. Lymphadenectomy clips in the right axilla.  Abdomen / Pelvis Impression:  1. No evidence metastasis in the abdomen or pelvis. 2. Calcified uterine fibroids.   Electronically Signed   By: SSuzy BouchardM.D.   On: 10/10/2013 08:32   Ct Abdomen Pelvis W Contrast  10/10/2013   CLINICAL DATA:  New diagnosis breast cancer. Chemotherapy ongoing. Left breast cancer  EXAM: CT CHEST, ABDOMEN, AND PELVIS WITH CONTRAST  TECHNIQUE: Multidetector CT imaging of the chest, abdomen and pelvis was performed following the standard protocol during bolus administration of intravenous contrast.  CONTRAST:  1066mOMNIPAQUE IOHEXOL 300 MG/ML  SOLN  COMPARISON:  None.  FINDINGS: CT CHEST FINDINGS  There is a port in the right chest wall. There is no axillary lymphadenopathy. There are lymphadenectomy clips in the right axilla. No mediastinal or hilar lymphadenopathy. No pericardial fluid. No central pulmonary embolism.  Review of the lung parenchyma new demonstrates no pulmonary  nodules. Airways are normal.  CT ABDOMEN AND PELVIS FINDINGS  Hepatobiliary: No focal hepatic lesion.  Gallbladder is normal.  Spleen:  Spleen is normal.   Pancreas: Pancreas is normal.  Stomach/Bowel: Stomach, small bowel wall, appendix, and colon are normal. Moderate volume stool throughout the colon. No obstructing lesion.  Adrenals/urinary tract: Adrenal glands and kidneys are normal.  Vascular/Lymphatic: Abdominal is normal caliber. No retroperitoneal or periportal lymphadenopathy. No mesenteric adenopathy. No peritoneal implants.  Muskuloskeletal: No aggressive osseous lesion.  Other: Fibroid uterus. The ovaries are normal. No pelvic lymphadenopathy.  IMPRESSION: Chest Impression:  1. No evidence of thoracic metastasis. 2. Lymphadenectomy clips in the right axilla.  Abdomen / Pelvis Impression:  1. No evidence metastasis in the abdomen or pelvis. 2. Calcified uterine fibroids.   Electronically Signed   By: Suzy Bouchard M.D.   On: 10/10/2013 08:32    ASSESSMENT: 38 y.o. BRCA negative  woman status post right breast biopsy 07/17/2013 for high-grade ductal carcinoma in situ, estrogen and progesterone receptor positive  (1) status post right lumpectomy and sentinel lymph node sampling 08/08/2013 for a pT1b pN0, stage IA invasive ductal carcinoma, grade 3, estrogen and progesterone receptor positive, with an MIB-1 of 62% and no HER-2 amplification  (2) genetics sent 08/02/2013 was normal but did identify a variant of uncertain significance called BRCA2, [id number].  (3) Oncotype score of 23 predicts a risk of outside the breast recurrence within 10 years of 15% if the patient's only systemic treatment is tamoxifen for 5 years. Adjuvant chemotherapy was recommended  (4) doxorubicin and cyclophosphamide in dose dense fashion x4 with Neulasta support to start 10/05/2013, to be followed by paclitaxel weekly x12  (5) adjuvant radiation to follow chemotherapy  (6) anti-estrogens for 5-10 years to follow radiation  PLAN: Jennifer Frazier is doing well today. The labs were reviewed in detail and were entirely stable. We will proceed with day 1, cycle 3 of  doxorubicin and cyclophosphamide.   Cintya will return next week for her nadir visit. She understands and agrees with this plan. She knows the goal of treatment in her case is cure. She has been encouraged to call with any issues that might arise before her next visit here.    Marcelino Duster, NP   11/02/2013 3:28 PM

## 2013-11-03 ENCOUNTER — Ambulatory Visit (HOSPITAL_BASED_OUTPATIENT_CLINIC_OR_DEPARTMENT_OTHER): Payer: 59

## 2013-11-03 ENCOUNTER — Other Ambulatory Visit: Payer: Self-pay | Admitting: Oncology

## 2013-11-03 ENCOUNTER — Ambulatory Visit (HOSPITAL_COMMUNITY)
Admission: RE | Admit: 2013-11-03 | Discharge: 2013-11-03 | Disposition: A | Discharge status: Home / Self Care | Payer: 59 | Source: Ambulatory Visit | Attending: Oncology | Admitting: Oncology

## 2013-11-03 VITALS — BP 144/83 | HR 69 | Temp 98.2°F

## 2013-11-03 DIAGNOSIS — C50411 Malignant neoplasm of upper-outer quadrant of right female breast: Secondary | ICD-10-CM

## 2013-11-03 DIAGNOSIS — Z5189 Encounter for other specified aftercare: Secondary | ICD-10-CM

## 2013-11-03 MED ORDER — PEGFILGRASTIM INJECTION 6 MG/0.6ML
6.0000 mg | Freq: Once | SUBCUTANEOUS | Status: AC
  Administered 2013-11-03: 6 mg via SUBCUTANEOUS
  Filled 2013-11-03: qty 0.6

## 2013-11-03 NOTE — Procedures (Signed)
Patient came to IR with portacath septum flipped upside down.  I was able to manipulate the port and successfully flip the portacath septum back into proper position.  Dr. Kathlene Cote was notified and after looking at the previous chest xray did not feel that a contrast injection of the catheter was needed.  Patient was instructed to sleep with head elevated and was discharged to the cancer center.

## 2013-11-09 ENCOUNTER — Other Ambulatory Visit (HOSPITAL_BASED_OUTPATIENT_CLINIC_OR_DEPARTMENT_OTHER): Payer: 59

## 2013-11-09 ENCOUNTER — Ambulatory Visit (HOSPITAL_BASED_OUTPATIENT_CLINIC_OR_DEPARTMENT_OTHER): Payer: 59 | Admitting: Nurse Practitioner

## 2013-11-09 ENCOUNTER — Encounter: Payer: Self-pay | Admitting: Nurse Practitioner

## 2013-11-09 ENCOUNTER — Telehealth: Payer: Self-pay | Admitting: Nurse Practitioner

## 2013-11-09 ENCOUNTER — Telehealth: Payer: Self-pay | Admitting: *Deleted

## 2013-11-09 VITALS — BP 131/77 | HR 87 | Temp 98.7°F | Resp 18 | Ht 69.0 in | Wt 224.6 lb

## 2013-11-09 DIAGNOSIS — Z803 Family history of malignant neoplasm of breast: Secondary | ICD-10-CM

## 2013-11-09 DIAGNOSIS — Z170 Estrogen receptor positive status [ER+]: Secondary | ICD-10-CM

## 2013-11-09 DIAGNOSIS — C50411 Malignant neoplasm of upper-outer quadrant of right female breast: Secondary | ICD-10-CM

## 2013-11-09 DIAGNOSIS — G4700 Insomnia, unspecified: Secondary | ICD-10-CM

## 2013-11-09 DIAGNOSIS — D0510 Intraductal carcinoma in situ of unspecified breast: Secondary | ICD-10-CM

## 2013-11-09 DIAGNOSIS — Z800 Family history of malignant neoplasm of digestive organs: Secondary | ICD-10-CM

## 2013-11-09 LAB — CBC WITH DIFFERENTIAL/PLATELET
BASO%: 0.9 % (ref 0.0–2.0)
Basophils Absolute: 0 10*3/uL (ref 0.0–0.1)
EOS%: 0.8 % (ref 0.0–7.0)
Eosinophils Absolute: 0 10*3/uL (ref 0.0–0.5)
HCT: 33.9 % — ABNORMAL LOW (ref 34.8–46.6)
HEMOGLOBIN: 10.8 g/dL — AB (ref 11.6–15.9)
LYMPH%: 11.1 % — ABNORMAL LOW (ref 14.0–49.7)
MCH: 26.1 pg (ref 25.1–34.0)
MCHC: 31.9 g/dL (ref 31.5–36.0)
MCV: 82 fL (ref 79.5–101.0)
MONO#: 0 10*3/uL — ABNORMAL LOW (ref 0.1–0.9)
MONO%: 2.4 % (ref 0.0–14.0)
NEUT#: 1.7 10*3/uL (ref 1.5–6.5)
NEUT%: 84.8 % — ABNORMAL HIGH (ref 38.4–76.8)
Platelets: 169 10*3/uL (ref 145–400)
RBC: 4.14 10*6/uL (ref 3.70–5.45)
RDW: 15.7 % — AB (ref 11.2–14.5)
WBC: 2 10*3/uL — ABNORMAL LOW (ref 3.9–10.3)
lymph#: 0.2 10*3/uL — ABNORMAL LOW (ref 0.9–3.3)

## 2013-11-09 LAB — COMPREHENSIVE METABOLIC PANEL (CC13)
ALBUMIN: 3.6 g/dL (ref 3.5–5.0)
ALK PHOS: 115 U/L (ref 40–150)
ALT: 8 U/L (ref 0–55)
AST: 12 U/L (ref 5–34)
Anion Gap: 12 mEq/L — ABNORMAL HIGH (ref 3–11)
BUN: 12.4 mg/dL (ref 7.0–26.0)
CO2: 22 mEq/L (ref 22–29)
Calcium: 10 mg/dL (ref 8.4–10.4)
Chloride: 108 mEq/L (ref 98–109)
Creatinine: 0.8 mg/dL (ref 0.6–1.1)
Glucose: 91 mg/dl (ref 70–140)
POTASSIUM: 4.2 meq/L (ref 3.5–5.1)
Sodium: 142 mEq/L (ref 136–145)
Total Bilirubin: 0.62 mg/dL (ref 0.20–1.20)
Total Protein: 7 g/dL (ref 6.4–8.3)

## 2013-11-09 NOTE — Telephone Encounter (Signed)
Per staff message and POF I have scheduled appts. Advised scheduler of appts. JMW  

## 2013-11-09 NOTE — Telephone Encounter (Signed)
m °

## 2013-11-09 NOTE — Progress Notes (Signed)
[place]  [phone] [phone]     ID: RAYEL SANTIZO DOB: 03/07/1975  MR#: [id number]  GTX#:[id number]  Patient Care Team: Sharilyn Sites, MD as PCP - General (Family Medicine) Chauncey Cruel, MD as Consulting Physician (Oncology) Thea Silversmith, MD as Consulting Physician (Radiation Oncology) [name], MD as Consulting Physician (General Surgery)   CHIEF COMPLAINT: Estrogen receptor positive stage I breast cancer  CURRENT TREATMENT: receiving adjuvant chemotherapy   BREAST CANCER HISTORY: From the original intake note:  The patient had screening [facility]ography at [facility] showing calcifications in the right breast, and was referred to the breast Center 07/06/2013 for additional views. Right diagnostic mammography confirmed a group of amorphous calcifications in the upper outer right breast measuring 1.2 cm.  Biopsy of this area 07/17/2013 showed ([id number]) ductal carcinoma in situ, high-grade, estrogen receptor 100% positive, and progesterone receptor 96% positive, both with strong staining intensity.  The patient was then referred to surgery and on 07/25/2013 underwent bilateral breast MRI. This showed a breast density category CEA. In the right breast there was an area of clumped nodular enhancement measuring 3 cm. There was also a 1.5 cm circumscribed oval mass in the medial portion of the right breast consistent with a fibroadenoma. The left breast was unremarkable, and there were no abnormal appearing lymph nodes.  On 08/08/2013 the patient underwent right lumpectomy and sentinel lymph node sampling. The pathology ([id number] was (showed, in addition to ductal carcinoma in situ, invasive ductal carcinoma, measuring 0.7 cm, grade 3, estrogen receptor 86% positive, progesterone receptor 89% positive, both with strong staining intensity, with an MIB-1 of 62%, and no HER-2 amplification. Both sentinel lymph nodes were clear.  Margins were ample.   The patient's subsequent history is as detailed below  INTERVAL HISTORY: Raechelle returns to clinic today for follow up of her breast cancer, accompanied by her mother. Today is day 8, cycle 3 of 4 planned cycles of cyclophosphamide and doxorubicin, with neulasta on day 2 given for granulocyte support.   REVIEW OF SYSTEMS: Gitty is tolerating her treatments well. Her main complaint is continued insomnia. She has tried 0.68m lorazepam and tylenol PM with no relief. She sleeps about 1-2 hrs at a time at night, and is typically unable to fall back asleep in a timely manner. She didn't take any dexamethasone at home this round, but had no nausea. She denies fevers, chills, or changes in bowel or bladder habits. She has no mouth sores or rashes. She denies shortness of breath, chest pain, cough, or palpitations. A detailed review of systems is otherwise noncontributory.  PAST MEDICAL HISTORY: Past Medical History  Diagnosis Date  . Fibroids     uterine fibroids  . Anemia   . Thyroid disease   . Contact lens/glasses fitting     wears contacts or glasses  . Sleep apnea     has a cpap-does not always use it    PAST SURGICAL HISTORY: Past Surgical History  Procedure Laterality Date  . [place] 2012    urerine ablasion  . Eye surgery  2013    retinal tear-lazer-both  . Wisdom tooth extraction    . Breast surgery      lumpectomy 7/14  . Portacath placement Right 09/29/2013    Procedure: INSERTION PORT-A-CATH WITH ULTRA SOUND;  Surgeon: TErroll Luna MD;  [facility]  Service: General;  Laterality: Right;    FAMILY HISTORY Family History  Problem Relation Age  of Onset  . Cancer Father 59    colon  . Cancer Maternal Aunt 50    breast cancer  . Cancer Paternal Aunt 21    breast cancer  . Cancer Maternal Grandmother 64    ovarian or uterine cancer  . Cancer Paternal Grandmother     unknown primary   the patient's father died at the age of 64,  been diagnosed with colon cancer at the age of 106. The patient's mother is living, currently 74 years old. The patient has one brother, no sisters. One of the patient's mother's sisters was diagnosed with breast cancer at the age of 38 in one of the patient's father's mother was diagnosed with breast cancer at the age of 71. There is no history of ovarian cancer in the family.  GYNECOLOGIC HISTORY:  Patient's last menstrual period was 10/08/2013. Menarche age 51, for the patient is GX P0. She still having regular periods. 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 She is single and her mother lives with her.(The patient's mother is wheelchair-bound secondary to a stroke).    ADVANCED DIRECTIVES: Not in place; at the 08/24/2013 visit the patient was given the appropriate documents to come feel notarize associate may declare healthcare power of attorney.   HEALTH MAINTENANCE: History  Substance Use Topics  . Smoking status: Never Smoker   . Smokeless tobacco: Not on file  . Alcohol Use: No     Colonoscopy:  PAP:  Bone density:  Lipid panel:  Allergies  Allergen Reactions  . Gadolinium      Desc: NAUSEA WITH MRI CONTRAST-NO VOMITING   . Tramadol Hives and Nausea Only    Current Outpatient Prescriptions  Medication Sig Dispense Refill  . Cholecalciferol (VITAMIN D3) 3000 UNITS TABS Take 10,000 Units by mouth daily.      [name] dexamethasone (DECADRON) 4 MG tablet Take 2 tablets by mouth once a day on the day after chemotherapy and then take 2 tablets two times a day for 2 days. Take with food.  30 tablet  1  . ibuprofen (ADVIL,MOTRIN) 100 MG/5ML suspension Take 200 mg by mouth every 4 (four) hours as needed.      . lidocaine-prilocaine (EMLA) cream Apply 1 application topically as needed.  30 g  0  . LORazepam (ATIVAN) 0.5 MG tablet Take 1 tablet (0.5 mg total)  by mouth every 6 (six) hours as needed (Nausea or vomiting).  30 tablet  0  . ondansetron (ZOFRAN) 8 MG tablet Take 1 tablet (8 mg total) by mouth 2 (two) times daily as needed. Start on the third day after chemotherapy.  30 tablet  1  . prochlorperazine (COMPAZINE) 10 MG tablet Take 1 tablet (10 mg total) by mouth every 6 (six) hours as needed (Nausea or vomiting).  30 tablet  1  . promethazine (PHENERGAN) 25 MG tablet Take 25 mg by mouth every 6 (six) hours as needed for nausea or vomiting.      . thyroid (ARMOUR) 130 MG tablet Take 130 mg by mouth daily.      . vitamin B-12 (CYANOCOBALAMIN) 1000 MCG tablet Take 5,000 mcg by mouth daily.       No current facility-administered medications for this visit.    OBJECTIVE: Middle-aged [place] American woman in no acute distress Filed Vitals:   11/09/13 1133  BP: 131/77  Pulse: 87  Temp: 98.7 F (37.1 C)  Resp: 18     Body mass index is 33.15 kg/(m^2).    ECOG FS:1 - Symptomatic but completely ambulatory  Skin warm and dry, palms and nail beds hyperpigmented Sclerae unicteric, pupils equal and reactive Oropharynx clear and moist-- no thrush No cervical or supraclavicular adenopathy Lungs no rales or rhonchi Heart regular rate and rhythm Abd soft, nontender, positive bowel sounds MSK no focal spinal tenderness, no upper extremity lymphedema Neuro: nonfocal, well oriented, appropriate affect Breasts: deferred, axilla benign.    LAB RESULTS:  CMP     Component Value Date/Time   NA 142 11/09/2013 1118   NA 140 10/12/2013 1628   K 4.2 11/09/2013 1118   K 4.3 10/12/2013 1628   CL 104 10/12/2013 1628   CO2 22 11/09/2013 1118   CO2 26 10/12/2013 1628   GLUCOSE 91 11/09/2013 1118   GLUCOSE 89 10/12/2013 1628   BUN 12.4 11/09/2013 1118   BUN 15 10/12/2013 1628   CREATININE 0.8 11/09/2013 1118   CREATININE 0.74 10/12/2013 1628   CALCIUM 10.0 11/09/2013 1118   CALCIUM 9.6 10/12/2013 1628   PROT 7.0 11/09/2013 1118   PROT 6.7 10/12/2013  1628   ALBUMIN 3.6 11/09/2013 1118   ALBUMIN 3.3* 10/12/2013 1628   AST 12 11/09/2013 1118   AST 15 10/12/2013 1628   ALT 8 11/09/2013 1118   ALT 16 10/12/2013 1628   ALKPHOS 115 11/09/2013 1118   ALKPHOS 93 10/12/2013 1628   BILITOT 0.62 11/09/2013 1118   BILITOT 0.4 10/12/2013 1628   GFRNONAA >90 08/04/2013 1245   GFRAA >90 08/04/2013 1245    I No results found for this basename: SPEP,  UPEP,   kappa and lambda light chains    Lab Results  Component Value Date   WBC 2.0* 11/09/2013   NEUTROABS 1.7 11/09/2013   HGB 10.8* 11/09/2013   HCT 33.9* 11/09/2013   MCV 82.0 11/09/2013   PLT 169 11/09/2013      Chemistry      Component Value Date/Time   NA 142 11/09/2013 1118   NA 140 10/12/2013 1628   K 4.2 11/09/2013 1118   K 4.3 10/12/2013 1628   CL 104 10/12/2013 1628   CO2 22 11/09/2013 1118   CO2 26 10/12/2013 1628   BUN 12.4 11/09/2013 1118   BUN 15 10/12/2013 1628   CREATININE 0.8 11/09/2013 1118   CREATININE 0.74 10/12/2013 1628      Component Value Date/Time   CALCIUM 10.0 11/09/2013 1118   CALCIUM 9.6 10/12/2013 1628   ALKPHOS 115 11/09/2013 1118   ALKPHOS 93 10/12/2013 1628   AST 12 11/09/2013 1118   AST 15 10/12/2013 1628   ALT 8 11/09/2013 1118   ALT 16 10/12/2013 1628   BILITOT 0.62 11/09/2013 1118   BILITOT 0.4 10/12/2013 1628       No results found for this basename: LABCA2    No components found with this basename: LABCA125    No results found for this basename: INR,  in the last 168 hours  Urinalysis    Component Value Date/Time   COLORURINE YELLOW 10/21/2009 1440   APPEARANCEUR CLEAR 10/21/2009 1440   LABSPEC 1.012 10/21/2009 1440   PHURINE 6.0 10/21/2009 1440   GLUCOSEU NEGATIVE 10/21/2009 1440   HGBUR NEGATIVE 10/21/2009 1440   BILIRUBINUR NEGATIVE 10/21/2009 1440   KETONESUR 15* 10/21/2009 1440   PROTEINUR NEGATIVE 10/21/2009 1440   UROBILINOGEN 0.2 10/21/2009 1440   NITRITE NEGATIVE 10/21/2009 1440   LEUKOCYTESUR NEGATIVE MICROSCOPIC NOT DONE  ON  URINES WITH NEGATIVE PROTEIN, BLOOD, LEUKOCYTES, NITRITE, OR GLUCOSE <1000 mg/dL. 10/21/2009 1440    STUDIES: Dg Chest 2 View  11/02/2013   CLINICAL DATA:  Evaluation of Port-A-Cath.  Breast cancer.  EXAM: CHEST  2 VIEW  COMPARISON:  Chest CT 10/10/2013 chest radiograph 09/29/2013 ring and  FINDINGS: Right-sided IJ power Port-A-Cath is present. The distal tip projects over the expected location of the mid superior vena cava on the frontal and lateral views. The Port-A-Cath appears appropriately positioned. Heart and mediastinal contours are stable and within normal limits. Surgical clips project over the right breast and right axillary region. Negative for pleural effusion or pneumothorax. Negative for airspace disease.  Stable mild convex right scoliosis of the mid to lower thoracic spine.  IMPRESSION: Right IJ power Port-A-Cath appears appropriately positioned, and projects over the mid superior vena cava.  No acute cardiopulmonary disease.   Electronically Signed   By: Curlene Dolphin M.D.   On: 11/02/2013 14:47   Ct Chest W Contrast  10/10/2013   CLINICAL DATA:  New diagnosis breast cancer. Chemotherapy ongoing. Left breast cancer  EXAM: CT CHEST, ABDOMEN, AND PELVIS WITH CONTRAST  TECHNIQUE: Multidetector CT imaging of the chest, abdomen and pelvis was performed following the standard protocol during bolus administration of intravenous contrast.  CONTRAST:  15m OMNIPAQUE IOHEXOL 300 MG/ML  SOLN  COMPARISON:  None.  FINDINGS: CT CHEST FINDINGS  There is a port in the right chest wall. There is no axillary lymphadenopathy. There are lymphadenectomy clips in the right axilla. No mediastinal or hilar lymphadenopathy. No pericardial fluid. No central pulmonary embolism.  Review of the lung parenchyma new demonstrates no pulmonary nodules. Airways are normal.  CT ABDOMEN AND PELVIS FINDINGS  Hepatobiliary: No focal hepatic lesion.  Gallbladder is normal.  Spleen:  Spleen is normal.  Pancreas: Pancreas is  normal.  Stomach/Bowel: Stomach, small bowel wall, appendix, and colon are normal. Moderate volume stool throughout the colon. No obstructing lesion.  Adrenals/urinary tract: Adrenal glands and kidneys are normal.  Vascular/Lymphatic: Abdominal is normal caliber. No retroperitoneal or periportal lymphadenopathy. No mesenteric adenopathy. No peritoneal implants.  Muskuloskeletal: No aggressive osseous lesion.  Other: Fibroid uterus. The ovaries are normal. No pelvic lymphadenopathy.  IMPRESSION: Chest Impression:  1. No evidence of thoracic metastasis. 2. Lymphadenectomy clips in the right axilla.  Abdomen / Pelvis Impression:  1. No evidence metastasis in the abdomen or pelvis. 2. Calcified uterine fibroids.   Electronically Signed   By: SSuzy BouchardM.D.   On: 10/10/2013 08:32   Ct Abdomen Pelvis W Contrast  10/10/2013   CLINICAL DATA:  New diagnosis breast cancer. Chemotherapy ongoing. Left breast cancer  EXAM: CT CHEST, ABDOMEN, AND PELVIS WITH CONTRAST  TECHNIQUE: Multidetector CT imaging of the chest, abdomen and pelvis was performed following the standard protocol during bolus administration of intravenous contrast.  CONTRAST:  1053mOMNIPAQUE IOHEXOL 300 MG/ML  SOLN  COMPARISON:  None.  FINDINGS: CT CHEST FINDINGS  There is a port in the right chest wall. There is no axillary lymphadenopathy. There are lymphadenectomy clips in the right axilla. No mediastinal or hilar lymphadenopathy. No pericardial fluid. No central pulmonary embolism.  Review of the lung parenchyma new demonstrates no pulmonary nodules. Airways are normal.  CT ABDOMEN AND PELVIS FINDINGS  Hepatobiliary: No focal hepatic lesion.  Gallbladder is normal.  Spleen:  Spleen is normal.  Pancreas: Pancreas is normal.  Stomach/Bowel: Stomach, small bowel wall, appendix, and colon are normal. Moderate volume stool throughout the  colon. No obstructing lesion.  Adrenals/urinary tract: Adrenal glands and kidneys are normal.  Vascular/Lymphatic:  Abdominal is normal caliber. No retroperitoneal or periportal lymphadenopathy. No mesenteric adenopathy. No peritoneal implants.  Muskuloskeletal: No aggressive osseous lesion.  Other: Fibroid uterus. The ovaries are normal. No pelvic lymphadenopathy.  IMPRESSION: Chest Impression:  1. No evidence of thoracic metastasis. 2. Lymphadenectomy clips in the right axilla.  Abdomen / Pelvis Impression:  1. No evidence metastasis in the abdomen or pelvis. 2. Calcified uterine fibroids.   Electronically Signed   By: Suzy Bouchard M.D.   On: 10/10/2013 08:32    ASSESSMENT: 38 y.o. BRCA negative  woman status post right breast biopsy 07/17/2013 for high-grade ductal carcinoma in situ, estrogen and progesterone receptor positive  (1) status post right lumpectomy and sentinel lymph node sampling 08/08/2013 for a pT1b pN0, stage IA invasive ductal carcinoma, grade 3, estrogen and progesterone receptor positive, with an MIB-1 of 62% and no HER-2 amplification  (2) genetics sent 08/02/2013 was normal but did identify a variant of uncertain significance called BRCA2, [id number].  (3) Oncotype score of 23 predicts a risk of outside the breast recurrence within 10 years of 15% if the patient's only systemic treatment is tamoxifen for 5 years. Adjuvant chemotherapy was recommended  (4) doxorubicin and cyclophosphamide in dose dense fashion x4 with Neulasta support to start 10/05/2013, to be followed by paclitaxel weekly x12  (5) adjuvant radiation to follow chemotherapy  (6) anti-estrogens for 5-10 years to follow radiation  PLAN: Suzann looks and feels well today. The CBC was reviewed in detail and was stable. For her insomnia, I suggested she up her dose to 22m lorazepam nightly (using 2 tablets of her current 0.577mprescription) and also look into starting 75m50melatonin QHS. She should "wind down" and make good choices regarding her bedtime routine, turning off cell phone and tv, etc.   WenAyannall  return next week for there 4th and final cycle of doxorubicin and cyclophosphamide. She understands and agrees with this plan. She knows the goal of treatment in her case is cure. She has been encouraged to call with any issues that might arise before her next visit here.   FerMarcelino DusterP   11/09/2013 12:15 PM

## 2013-11-16 ENCOUNTER — Ambulatory Visit (HOSPITAL_BASED_OUTPATIENT_CLINIC_OR_DEPARTMENT_OTHER): Payer: 59 | Admitting: Nurse Practitioner

## 2013-11-16 ENCOUNTER — Ambulatory Visit: Payer: 59

## 2013-11-16 ENCOUNTER — Other Ambulatory Visit: Payer: Self-pay | Admitting: *Deleted

## 2013-11-16 ENCOUNTER — Encounter: Payer: Self-pay | Admitting: Nurse Practitioner

## 2013-11-16 ENCOUNTER — Ambulatory Visit (HOSPITAL_BASED_OUTPATIENT_CLINIC_OR_DEPARTMENT_OTHER): Payer: 59 | Admitting: *Deleted

## 2013-11-16 VITALS — BP 136/92 | HR 78 | Temp 98.2°F | Resp 18 | Ht 69.0 in | Wt 225.5 lb

## 2013-11-16 DIAGNOSIS — Z800 Family history of malignant neoplasm of digestive organs: Secondary | ICD-10-CM

## 2013-11-16 DIAGNOSIS — Z170 Estrogen receptor positive status [ER+]: Secondary | ICD-10-CM

## 2013-11-16 DIAGNOSIS — C50411 Malignant neoplasm of upper-outer quadrant of right female breast: Secondary | ICD-10-CM

## 2013-11-16 DIAGNOSIS — G4700 Insomnia, unspecified: Secondary | ICD-10-CM

## 2013-11-16 DIAGNOSIS — Z5111 Encounter for antineoplastic chemotherapy: Secondary | ICD-10-CM

## 2013-11-16 DIAGNOSIS — Z803 Family history of malignant neoplasm of breast: Secondary | ICD-10-CM

## 2013-11-16 DIAGNOSIS — D0510 Intraductal carcinoma in situ of unspecified breast: Secondary | ICD-10-CM

## 2013-11-16 LAB — CBC WITH DIFFERENTIAL/PLATELET
BASO%: 0.7 % (ref 0.0–2.0)
Basophils Absolute: 0 10*3/uL (ref 0.0–0.1)
EOS ABS: 0 10*3/uL (ref 0.0–0.5)
EOS%: 0.5 % (ref 0.0–7.0)
HCT: 34.4 % — ABNORMAL LOW (ref 34.8–46.6)
HGB: 11.3 g/dL — ABNORMAL LOW (ref 11.6–15.9)
LYMPH%: 10.9 % — ABNORMAL LOW (ref 14.0–49.7)
MCH: 26.8 pg (ref 25.1–34.0)
MCHC: 32.8 g/dL (ref 31.5–36.0)
MCV: 81.5 fL (ref 79.5–101.0)
MONO#: 0.6 10*3/uL (ref 0.1–0.9)
MONO%: 9.2 % (ref 0.0–14.0)
NEUT%: 78.7 % — ABNORMAL HIGH (ref 38.4–76.8)
NEUTROS ABS: 4.8 10*3/uL (ref 1.5–6.5)
PLATELETS: 169 10*3/uL (ref 145–400)
RBC: 4.22 10*6/uL (ref 3.70–5.45)
RDW: 16.1 % — ABNORMAL HIGH (ref 11.2–14.5)
WBC: 6.1 10*3/uL (ref 3.9–10.3)
lymph#: 0.7 10*3/uL — ABNORMAL LOW (ref 0.9–3.3)
nRBC: 1 % — ABNORMAL HIGH (ref 0–0)

## 2013-11-16 MED ORDER — TRAZODONE HCL 50 MG PO TABS: 50.0000 mg | ORAL_TABLET | Freq: Every day | ORAL | Status: DC

## 2013-11-16 MED ORDER — ACETAMINOPHEN 325 MG PO TABS
ORAL_TABLET | ORAL | Status: AC
  Filled 2013-11-16: qty 2

## 2013-11-16 MED ORDER — DOXORUBICIN HCL CHEMO IV INJECTION 2 MG/ML
60.0000 mg/m2 | Freq: Once | INTRAVENOUS | Status: AC
  Administered 2013-11-16: 134 mg via INTRAVENOUS
  Filled 2013-11-16: qty 67

## 2013-11-16 MED ORDER — PALONOSETRON HCL INJECTION 0.25 MG/5ML
0.2500 mg | Freq: Once | INTRAVENOUS | Status: AC
  Administered 2013-11-16: 0.25 mg via INTRAVENOUS

## 2013-11-16 MED ORDER — DEXAMETHASONE SODIUM PHOSPHATE 20 MG/5ML IJ SOLN
INTRAMUSCULAR | Status: AC
  Filled 2013-11-16: qty 5

## 2013-11-16 MED ORDER — SODIUM CHLORIDE 0.9 % IV SOLN
600.0000 mg/m2 | Freq: Once | INTRAVENOUS | Status: AC
  Administered 2013-11-16: 1340 mg via INTRAVENOUS
  Filled 2013-11-16: qty 67

## 2013-11-16 MED ORDER — DEXAMETHASONE SODIUM PHOSPHATE 20 MG/5ML IJ SOLN
12.0000 mg | Freq: Once | INTRAMUSCULAR | Status: AC
  Administered 2013-11-16: 12 mg via INTRAVENOUS

## 2013-11-16 MED ORDER — SODIUM CHLORIDE 0.9 % IV SOLN
Freq: Once | INTRAVENOUS | Status: AC
  Administered 2013-11-16: 13:00:00 via INTRAVENOUS

## 2013-11-16 MED ORDER — SODIUM CHLORIDE 0.9 % IV SOLN
150.0000 mg | Freq: Once | INTRAVENOUS | Status: AC
  Administered 2013-11-16: 150 mg via INTRAVENOUS
  Filled 2013-11-16: qty 5

## 2013-11-16 MED ORDER — ACETAMINOPHEN 325 MG PO TABS
650.0000 mg | ORAL_TABLET | Freq: Once | ORAL | Status: AC
  Administered 2013-11-16: 650 mg via ORAL

## 2013-11-16 MED ORDER — PALONOSETRON HCL INJECTION 0.25 MG/5ML
INTRAVENOUS | Status: AC
  Filled 2013-11-16: qty 5

## 2013-11-16 NOTE — Progress Notes (Signed)
Unable to access PAC.  It feels like it is flipped over.  Pt states it was flipped once and Radiologist was able to flip it back over the right side up.  Dr. Jana Hakim notified and Dye study ordered for tomorrow. .  Pt agreed to allow RN to place peripheral IV for chemo today.

## 2013-11-16 NOTE — Progress Notes (Signed)
[facility]  [phone] [phone]     ID: MASAE Frazier DOB: 11/15/1975  MR#: [id number]  KTG#:[id number]  Patient Care Team: Sharilyn Sites, MD as PCP - General (Family Medicine) Chauncey Cruel, MD as Consulting Physician (Oncology) Thea Silversmith, MD as Consulting Physician (Radiation Oncology) [name], MD as Consulting Physician (General Surgery)   CHIEF COMPLAINT: Estrogen receptor positive stage I breast cancer  CURRENT TREATMENT: receiving adjuvant chemotherapy   BREAST CANCER HISTORY: From the original intake note:  The patient had screening mammography at [facility] showing calcifications in the right breast, and was referred to the breast Center 07/06/2013 for additional views. Right diagnostic mammography confirmed a group of amorphous calcifications in the upper outer right breast measuring 1.2 cm.  Biopsy of this area 07/17/2013 showed ([id number]) ductal carcinoma in situ, high-grade, estrogen receptor 100% positive, and progesterone receptor 96% positive, both with strong staining intensity.  The patient was then referred to surgery and on 07/25/2013 underwent bilateral breast MRI. This showed a breast density category CEA. In the right breast there was an area of clumped nodular enhancement measuring 3 cm. There was also a 1.5 cm circumscribed oval mass in the medial portion of the right breast consistent with a fibroadenoma. The left breast was unremarkable, and there were no abnormal appearing lymph nodes.  On 08/08/2013 the patient underwent right lumpectomy and sentinel lymph node sampling. The pathology ([id number] was (showed, in addition to ductal carcinoma in situ, invasive ductal carcinoma, measuring 0.7 cm, grade 3, estrogen receptor 86% positive, progesterone receptor 89% positive, both with strong staining intensity, with an MIB-1 of 62%, and no HER-2 amplification. Both sentinel lymph nodes were clear.  Margins were ample.   The patient's subsequent history is as detailed below  INTERVAL HISTORY: Jennifer Frazier returns to clinic today for follow up of her breast cancer, accompanied by her mother. Today she is due for her 4th and final planned cycle of cyclophosphamide and doxorubicin, with neulasta on day 2 given for granulocyte support. Insomnia continues to be a nightly struggle for Jennifer Frazier. Doubling her dose of lorazepam to 8m was only minimally helpful. She slept for longer, but still woke up in the middle of the night. Otherwise she denies fevers, chills, or changes in bowel or bladder habits. She has no mouth sores or rashes. She denies shortness of breath, chest pain, cough, or palpitations. A detailed review of systems is otherwise noncontributory.  REVIEW OF SYSTEMS: A detailed review of systems is negative except where noted above.   PAST MEDICAL HISTORY: Past Medical History  Diagnosis Date  . Fibroids     uterine fibroids  . Anemia   . Thyroid disease   . Contact lens/glasses fitting     wears contacts or glasses  . Sleep apnea     has a cpap-does not always use it    PAST SURGICAL HISTORY: Past Surgical History  Procedure Laterality Date  . [place] 2012    urerine ablasion  . Eye surgery  2013    retinal tear-lazer-both  . Wisdom tooth extraction    . Breast surgery      lumpectomy 7/14  . Portacath placement Right 09/29/2013    Procedure: INSERTION PORT-A-CATH WITH ULTRA SOUND;  Surgeon: TErroll Luna MD;  [facility]  Service: General;  Laterality: Right;    FAMILY HISTORY Family History  Problem Relation Age of Onset  . Cancer Father 521   colon  .  Cancer Maternal Aunt 50    breast cancer  . Cancer Paternal Aunt 58    breast cancer  . Cancer Maternal Grandmother 9    ovarian or uterine cancer  . Cancer Paternal Grandmother     unknown primary   the patient's father died at the age of 47, been diagnosed with colon cancer at the age of 73.  The patient's mother is living, currently 28 years old. The patient has one brother, no sisters. One of the patient's mother's sisters was diagnosed with breast cancer at the age of 11 in one of the patient's father's mother was diagnosed with breast cancer at the age of 52. There is no history of ovarian cancer in the family.  GYNECOLOGIC HISTORY:  Patient's last menstrual period was 10/08/2013. Menarche age 42, for the patient is GX P0. She still having regular periods. 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    ADVANCED DIRECTIVES: Not in place; at the 08/24/2013 visit the patient was given the appropriate documents to come feel notarize associate may declare healthcare power of attorney.   HEALTH MAINTENANCE: History  Substance Use Topics  . Smoking status: Never Smoker   . Smokeless tobacco: Not on file  . Alcohol Use: No     Colonoscopy:  PAP:  Bone density:  Lipid panel:  Allergies  Allergen Reactions  . Gadolinium      Desc: NAUSEA WITH MRI CONTRAST-NO VOMITING   . Tramadol Hives and Nausea Only    Current Outpatient Prescriptions  Medication Sig Dispense Refill  . Cholecalciferol (VITAMIN D3) 3000 UNITS TABS Take 10,000 Units by mouth daily.      [name] dexamethasone (DECADRON) 4 MG tablet Take 2 tablets by mouth once a day on the day after chemotherapy and then take 2 tablets two times a day for 2 days. Take with food.  30 tablet  1  . ibuprofen (ADVIL,MOTRIN) 100 MG/5ML suspension Take 200 mg by mouth every 4 (four) hours as needed.      . lidocaine-prilocaine (EMLA) cream Apply 1 application topically as needed.  30 g  0  . LORazepam (ATIVAN) 0.5 MG tablet Take 1 tablet (0.5 mg total) by mouth every 6 (six) hours as needed (Nausea or  vomiting).  30 tablet  0  . ondansetron (ZOFRAN) 8 MG tablet Take 1 tablet (8 mg total) by mouth 2 (two) times daily as needed. Start on the third day after chemotherapy.  30 tablet  1  . prochlorperazine (COMPAZINE) 10 MG tablet Take 1 tablet (10 mg total) by mouth every 6 (six) hours as needed (Nausea or vomiting).  30 tablet  1  . promethazine (PHENERGAN) 25 MG tablet Take 25 mg by mouth every 6 (six) hours as needed for nausea or vomiting.      . thyroid (ARMOUR) 130 MG tablet Take 130 mg by mouth daily.      . traZODone (DESYREL) 50 MG tablet Take 1 tablet (50 mg total) by mouth at bedtime.  30 tablet  2  . vitamin B-12 (CYANOCOBALAMIN) 1000 MCG tablet Take 5,000 mcg by mouth daily.       No current facility-administered medications for this visit.   Facility-Administered Medications Ordered in Other Visits  Medication Dose Route Frequency Provider Last Rate Last Dose  . cyclophosphamide (  CYTOXAN) 1,340 mg in sodium chloride 0.9 % 250 mL chemo infusion  600 mg/m2 (Treatment Plan Actual) Intravenous Once Chauncey Cruel, MD      . DOXOrubicin (ADRIAMYCIN) chemo injection 134 mg  60 mg/m2 (Treatment Plan Actual) Intravenous Once Chauncey Cruel, MD   134 mg at 11/16/13 1331    OBJECTIVE: Middle-aged [place] American woman in no acute distress Filed Vitals:   11/16/13 1129  BP: 136/92  Pulse: 78  Temp: 98.2 F (36.8 C)  Resp: 18     Body mass index is 33.29 kg/(m^2).    ECOG FS:1 - Symptomatic but completely ambulatory  Skin: warm, dry, nail beds and palms hyperpigmented HEENT: sclerae anicteric, conjunctivae pink, oropharynx clear. No thrush or mucositis.  Lymph Nodes: No cervical or supraclavicular lymphadenopathy  Lungs: clear to auscultation bilaterally, no rales, wheezes, or rhonci  Heart: regular rate and rhythm  Abdomen: round, soft, non tender, positive bowel sounds  Musculoskeletal: No focal spinal tenderness, no peripheral edema  Neuro: non focal, well oriented,  positive affect  Breasts: deferred, axilla benign.    LAB RESULTS:  CMP     Component Value Date/Time   NA 142 11/09/2013 1118   NA 140 10/12/2013 1628   K 4.2 11/09/2013 1118   K 4.3 10/12/2013 1628   CL 104 10/12/2013 1628   CO2 22 11/09/2013 1118   CO2 26 10/12/2013 1628   GLUCOSE 91 11/09/2013 1118   GLUCOSE 89 10/12/2013 1628   BUN 12.4 11/09/2013 1118   BUN 15 10/12/2013 1628   CREATININE 0.8 11/09/2013 1118   CREATININE 0.74 10/12/2013 1628   CALCIUM 10.0 11/09/2013 1118   CALCIUM 9.6 10/12/2013 1628   PROT 7.0 11/09/2013 1118   PROT 6.7 10/12/2013 1628   ALBUMIN 3.6 11/09/2013 1118   ALBUMIN 3.3* 10/12/2013 1628   AST 12 11/09/2013 1118   AST 15 10/12/2013 1628   ALT 8 11/09/2013 1118   ALT 16 10/12/2013 1628   ALKPHOS 115 11/09/2013 1118   ALKPHOS 93 10/12/2013 1628   BILITOT 0.62 11/09/2013 1118   BILITOT 0.4 10/12/2013 1628   GFRNONAA >90 08/04/2013 1245   GFRAA >90 08/04/2013 1245    I No results found for this basename: SPEP,  UPEP,   kappa and lambda light chains    Lab Results  Component Value Date   WBC 6.1 11/16/2013   NEUTROABS 4.8 11/16/2013   HGB 11.3* 11/16/2013   HCT 34.4* 11/16/2013   MCV 81.5 11/16/2013   PLT 169 11/16/2013      Chemistry      Component Value Date/Time   NA 142 11/09/2013 1118   NA 140 10/12/2013 1628   K 4.2 11/09/2013 1118   K 4.3 10/12/2013 1628   CL 104 10/12/2013 1628   CO2 22 11/09/2013 1118   CO2 26 10/12/2013 1628   BUN 12.4 11/09/2013 1118   BUN 15 10/12/2013 1628   CREATININE 0.8 11/09/2013 1118   CREATININE 0.74 10/12/2013 1628      Component Value Date/Time   CALCIUM 10.0 11/09/2013 1118   CALCIUM 9.6 10/12/2013 1628   ALKPHOS 115 11/09/2013 1118   ALKPHOS 93 10/12/2013 1628   AST 12 11/09/2013 1118   AST 15 10/12/2013 1628   ALT 8 11/09/2013 1118   ALT 16 10/12/2013 1628   BILITOT 0.62 11/09/2013 1118   BILITOT 0.4 10/12/2013 1628       No results found for this basename: LABCA2    No components found  with this basename: KYHCW237    No results found for this basename: INR,  in the last 168 hours  Urinalysis    Component Value Date/Time   COLORURINE YELLOW 10/21/2009 1440   APPEARANCEUR CLEAR 10/21/2009 1440   LABSPEC 1.012 10/21/2009 1440   PHURINE 6.0 10/21/2009 1440   GLUCOSEU NEGATIVE 10/21/2009 1440   HGBUR NEGATIVE 10/21/2009 1440   BILIRUBINUR NEGATIVE 10/21/2009 1440   KETONESUR 15* 10/21/2009 1440   PROTEINUR NEGATIVE 10/21/2009 1440   UROBILINOGEN 0.2 10/21/2009 1440   NITRITE NEGATIVE 10/21/2009 1440   LEUKOCYTESUR NEGATIVE MICROSCOPIC NOT DONE ON URINES WITH NEGATIVE PROTEIN, BLOOD, LEUKOCYTES, NITRITE, OR GLUCOSE <1000 mg/dL. 10/21/2009 1440    STUDIES: Dg Chest 2 View  11/02/2013   CLINICAL DATA:  Evaluation of Port-A-Cath.  Breast cancer.  EXAM: CHEST  2 VIEW  COMPARISON:  Chest CT 10/10/2013 chest radiograph 09/29/2013 ring and  FINDINGS: Right-sided IJ power Port-A-Cath is present. The distal tip projects over the expected location of the mid superior vena cava on the frontal and lateral views. The Port-A-Cath appears appropriately positioned. Heart and mediastinal contours are stable and within normal limits. Surgical clips project over the right breast and right axillary region. Negative for pleural effusion or pneumothorax. Negative for airspace disease.  Stable mild convex right scoliosis of the mid to lower thoracic spine.  IMPRESSION: Right IJ power Port-A-Cath appears appropriately positioned, and projects over the mid superior vena cava.  No acute cardiopulmonary disease.   Electronically Signed   By: Curlene Dolphin M.D.   On: 11/02/2013 14:47   Ir Patient Eval Tech 0-60 Mins  11/03/2013   Serena Croissant     11/03/2013  3:54 PM Patient came to IR with portacath septum flipped upside down.  I  was able to manipulate the port and successfully flip the  portacath septum back into proper position.  Dr. Kathlene Cote was  notified and after looking at the previous chest xray did not   feel that a contrast injection of the catheter was needed.   Patient was instructed to sleep with head elevated and was  discharged to the cancer center.   ASSESSMENT: 38 y.o. BRCA negative [place] woman status post right breast biopsy 07/17/2013 for high-grade ductal carcinoma in situ, estrogen and progesterone receptor positive  (1) status post right lumpectomy and sentinel lymph node sampling 08/08/2013 for a pT1b pN0, stage IA invasive ductal carcinoma, grade 3, estrogen and progesterone receptor positive, with an MIB-1 of 62% and no HER-2 amplification  (2) genetics sent 08/02/2013 was normal but did identify a variant of uncertain significance called BRCA2, [id number].  (3) Oncotype score of 23 predicts a risk of outside the breast recurrence within 10 years of 15% if the patient's only systemic treatment is tamoxifen for 5 years. Adjuvant chemotherapy was recommended  (4) doxorubicin and cyclophosphamide in dose dense fashion x4 with Neulasta support to start 10/05/2013, to be followed by paclitaxel weekly x12  (5) adjuvant radiation to follow chemotherapy  (6) anti-estrogens for 5-10 years to follow radiation  PLAN: Jennifer Frazier is doing well today. The CBC was reviewed in detail and was stable. We will proceed with her 4th and final cycle of doxorubicin and cyclophosphamide today.  The insomnia is really starting to bother Jennifer Frazier, it is negatively impacting her quality of life. I have sent a prescription for 52m trazodone QHS to her pharmacy to try next and she will discontinue the lorazepam.   WGhazalwill return next week for her nadir visit  and discussion on her next round of chemotherapy, paclitaxel weekly x 12. Lashana understands and agrees wit this plan. She knows the goal of treatment in her case is cure. She has been encouraged to call with any issues that might arise before her next visit here.   Marcelino Duster, NP   11/16/2013 1:35 PM

## 2013-11-16 NOTE — Patient Instructions (Signed)
[facility] Discharge Instructions for Patients Receiving Chemotherapy  Today you received the following chemotherapy agents; Adriamycin and Taxotere.   To help prevent nausea and vomiting after your treatment, we encourage you to take your nausea medication as directed.   If you develop nausea and vomiting that is not controlled by your nausea medication, call the clinic.   BELOW ARE SYMPTOMS THAT SHOULD BE REPORTED IMMEDIATELY:  *FEVER GREATER THAN 100.5 F  *CHILLS WITH OR WITHOUT FEVER  NAUSEA AND VOMITING THAT IS NOT CONTROLLED WITH YOUR NAUSEA MEDICATION  *UNUSUAL SHORTNESS OF BREATH  *UNUSUAL BRUISING OR BLEEDING  TENDERNESS IN MOUTH AND THROAT WITH OR WITHOUT PRESENCE OF ULCERS  *URINARY PROBLEMS  *BOWEL PROBLEMS  UNUSUAL RASH Items with * indicate a potential emergency and should be followed up as soon as possible.  Feel free to call the clinic you have any questions or concerns. The clinic phone number is [phone].

## 2013-11-17 ENCOUNTER — Ambulatory Visit (HOSPITAL_BASED_OUTPATIENT_CLINIC_OR_DEPARTMENT_OTHER): Payer: 59

## 2013-11-17 ENCOUNTER — Ambulatory Visit (HOSPITAL_COMMUNITY)
Admission: RE | Admit: 2013-11-17 | Discharge: 2013-11-17 | Disposition: A | Discharge status: Home / Self Care | Payer: 59 | Source: Ambulatory Visit | Attending: Oncology | Admitting: Oncology

## 2013-11-17 VITALS — BP 147/84 | HR 77 | Temp 98.0°F

## 2013-11-17 DIAGNOSIS — C50411 Malignant neoplasm of upper-outer quadrant of right female breast: Secondary | ICD-10-CM

## 2013-11-17 DIAGNOSIS — Z170 Estrogen receptor positive status [ER+]: Secondary | ICD-10-CM | POA: Diagnosis not present

## 2013-11-17 DIAGNOSIS — Y848 Other medical procedures as the cause of abnormal reaction of the patient, or of later complication, without mention of misadventure at the time of the procedure: Secondary | ICD-10-CM | POA: Diagnosis not present

## 2013-11-17 DIAGNOSIS — Z5189 Encounter for other specified aftercare: Secondary | ICD-10-CM

## 2013-11-17 DIAGNOSIS — C50919 Malignant neoplasm of unspecified site of unspecified female breast: Secondary | ICD-10-CM | POA: Diagnosis not present

## 2013-11-17 DIAGNOSIS — T82524A Displacement of infusion catheter, initial encounter: Secondary | ICD-10-CM | POA: Insufficient documentation

## 2013-11-17 MED ORDER — HEPARIN SOD (PORK) LOCK FLUSH 100 UNIT/ML IV SOLN
INTRAVENOUS | Status: AC
  Filled 2013-11-17: qty 5

## 2013-11-17 MED ORDER — PEGFILGRASTIM INJECTION 6 MG/0.6ML
6.0000 mg | Freq: Once | SUBCUTANEOUS | Status: AC
  Administered 2013-11-17: 6 mg via SUBCUTANEOUS
  Filled 2013-11-17: qty 0.6

## 2013-11-17 MED ORDER — HEPARIN SOD (PORK) LOCK FLUSH 100 UNIT/ML IV SOLN
500.0000 [IU] | Freq: Once | INTRAVENOUS | Status: AC
Start: 2013-11-17 — End: 2013-11-17
  Administered 2013-11-17: 500 [IU]

## 2013-11-17 MED ORDER — IOHEXOL 300 MG/ML  SOLN
10.0000 mL | Freq: Once | INTRAMUSCULAR | Status: AC | PRN
  Administered 2013-11-17: 10 mL via INTRAVENOUS

## 2013-11-17 NOTE — Patient Instructions (Signed)
Pegfilgrastim injection What is this medicine? PEGFILGRASTIM (peg fil GRA stim) is a long-acting granulocyte colony-stimulating factor that stimulates the growth of neutrophils, a type of white blood cell important in the body's fight against infection. It is used to reduce the incidence of fever and infection in patients with certain types of cancer who are receiving chemotherapy that affects the bone marrow. This medicine may be used for other purposes; ask your health care provider or pharmacist if you have questions. COMMON BRAND NAME(S): Neulasta What should I tell my health care provider before I take this medicine? They need to know if you have any of these conditions: -latex allergy -ongoing radiation therapy -sickle cell disease -skin reactions to acrylic adhesives (On-Body Injector only) -an unusual or allergic reaction to pegfilgrastim, filgrastim, other medicines, foods, dyes, or preservatives -pregnant or trying to get pregnant -breast-feeding How should I use this medicine? This medicine is for injection under the skin. If you get this medicine at home, you will be taught how to prepare and give the pre-filled syringe or how to use the On-body Injector. Refer to the patient Instructions for Use for detailed instructions. Use exactly as directed. Take your medicine at regular intervals. Do not take your medicine more often than directed. It is important that you put your used needles and syringes in a special sharps container. Do not put them in a trash can. If you do not have a sharps container, call your pharmacist or healthcare provider to get one. Talk to your pediatrician regarding the use of this medicine in children. Special care may be needed. Overdosage: If you think you have taken too much of this medicine contact a poison control center or emergency room at once. NOTE: This medicine is only for you. Do not share this medicine with others. What if I miss a dose? It is  important not to miss your dose. Call your doctor or health care professional if you miss your dose. If you miss a dose due to an On-body Injector failure or leakage, a new dose should be administered as soon as possible using a single prefilled syringe for manual use. What may interact with this medicine? Interactions have not been studied. Give your health care provider a list of all the medicines, herbs, non-prescription drugs, or dietary supplements you use. Also tell them if you smoke, drink alcohol, or use illegal drugs. Some items may interact with your medicine. This list may not describe all possible interactions. Give your health care provider a list of all the medicines, herbs, non-prescription drugs, or dietary supplements you use. Also tell them if you smoke, drink alcohol, or use illegal drugs. Some items may interact with your medicine. What should I watch for while using this medicine? You may need blood work done while you are taking this medicine. If you are going to need a MRI, CT scan, or other procedure, tell your doctor that you are using this medicine (On-Body Injector only). What side effects may I notice from receiving this medicine? Side effects that you should report to your doctor or health care professional as soon as possible: -allergic reactions like skin rash, itching or hives, swelling of the face, lips, or tongue -dizziness -fever -pain, redness, or irritation at site where injected -pinpoint red spots on the skin -shortness of breath or breathing problems -stomach or side pain, or pain at the shoulder -swelling -tiredness -trouble passing urine Side effects that usually do not require medical attention (report to your doctor   or health care professional if they continue or are bothersome): -bone pain -muscle pain This list may not describe all possible side effects. Call your doctor for medical advice about side effects. You may report side effects to FDA at  1-800-FDA-1088. Where should I keep my medicine? Keep out of the reach of children. Store pre-filled syringes in a refrigerator between 2 and 8 degrees C (36 and 46 degrees F). Do not freeze. Keep in carton to protect from light. Throw away this medicine if it is left out of the refrigerator for more than 48 hours. Throw away any unused medicine after the expiration date. NOTE: This sheet is a summary. It may not cover all possible information. If you have questions about this medicine, talk to your doctor, pharmacist, or health care provider.  2015, [employer]vier/[employer] Standard. (2013-04-13 16:14:05)  

## 2013-11-20 ENCOUNTER — Telehealth: Payer: Self-pay | Admitting: *Deleted

## 2013-11-20 ENCOUNTER — Encounter: Payer: Self-pay | Admitting: Hematology & Oncology

## 2013-11-20 NOTE — Progress Notes (Signed)
Insurance is paying neulasta.

## 2013-11-20 NOTE — Telephone Encounter (Signed)
This RN followed up on dye study obtained 10/23 per port concerns.  Called to Dr Cornett's and faxed report with need for revision.

## 2013-11-23 ENCOUNTER — Ambulatory Visit (HOSPITAL_BASED_OUTPATIENT_CLINIC_OR_DEPARTMENT_OTHER): Payer: 59 | Admitting: Oncology

## 2013-11-23 ENCOUNTER — Other Ambulatory Visit (HOSPITAL_BASED_OUTPATIENT_CLINIC_OR_DEPARTMENT_OTHER): Payer: 59

## 2013-11-23 ENCOUNTER — Other Ambulatory Visit: Payer: 59

## 2013-11-23 ENCOUNTER — Ambulatory Visit: Payer: 59 | Admitting: Nurse Practitioner

## 2013-11-23 VITALS — BP 131/75 | HR 70 | Temp 97.5°F | Resp 18 | Ht 69.0 in | Wt 228.7 lb

## 2013-11-23 DIAGNOSIS — Z800 Family history of malignant neoplasm of digestive organs: Secondary | ICD-10-CM

## 2013-11-23 DIAGNOSIS — Z803 Family history of malignant neoplasm of breast: Secondary | ICD-10-CM

## 2013-11-23 DIAGNOSIS — C50411 Malignant neoplasm of upper-outer quadrant of right female breast: Secondary | ICD-10-CM

## 2013-11-23 DIAGNOSIS — Z170 Estrogen receptor positive status [ER+]: Secondary | ICD-10-CM

## 2013-11-23 DIAGNOSIS — D0510 Intraductal carcinoma in situ of unspecified breast: Secondary | ICD-10-CM

## 2013-11-23 DIAGNOSIS — I808 Phlebitis and thrombophlebitis of other sites: Secondary | ICD-10-CM

## 2013-11-23 LAB — COMPREHENSIVE METABOLIC PANEL (CC13)
ALBUMIN: 3.7 g/dL (ref 3.5–5.0)
ALK PHOS: 109 U/L (ref 40–150)
ALT: 9 U/L (ref 0–55)
AST: 12 U/L (ref 5–34)
Anion Gap: 5 mEq/L (ref 3–11)
BUN: 12.4 mg/dL (ref 7.0–26.0)
CHLORIDE: 106 meq/L (ref 98–109)
CO2: 27 mEq/L (ref 22–29)
Calcium: 9.9 mg/dL (ref 8.4–10.4)
Creatinine: 0.7 mg/dL (ref 0.6–1.1)
GLUCOSE: 93 mg/dL (ref 70–140)
Potassium: 4 mEq/L (ref 3.5–5.1)
SODIUM: 138 meq/L (ref 136–145)
TOTAL PROTEIN: 6.7 g/dL (ref 6.4–8.3)
Total Bilirubin: 0.35 mg/dL (ref 0.20–1.20)

## 2013-11-23 LAB — CBC WITH DIFFERENTIAL/PLATELET
BASO%: 0.5 % (ref 0.0–2.0)
Basophils Absolute: 0 10*3/uL (ref 0.0–0.1)
EOS ABS: 0 10*3/uL (ref 0.0–0.5)
EOS%: 0.8 % (ref 0.0–7.0)
HCT: 30.1 % — ABNORMAL LOW (ref 34.8–46.6)
HGB: 9.8 g/dL — ABNORMAL LOW (ref 11.6–15.9)
LYMPH%: 10 % — ABNORMAL LOW (ref 14.0–49.7)
MCH: 26.3 pg (ref 25.1–34.0)
MCHC: 32.4 g/dL (ref 31.5–36.0)
MCV: 81.4 fL (ref 79.5–101.0)
MONO#: 0.1 10*3/uL (ref 0.1–0.9)
MONO%: 2.5 % (ref 0.0–14.0)
NEUT%: 86.2 % — ABNORMAL HIGH (ref 38.4–76.8)
NEUTROS ABS: 2.6 10*3/uL (ref 1.5–6.5)
Platelets: 184 10*3/uL (ref 145–400)
RBC: 3.7 10*6/uL (ref 3.70–5.45)
RDW: 16.5 % — AB (ref 11.2–14.5)
WBC: 3 10*3/uL — AB (ref 3.9–10.3)
lymph#: 0.3 10*3/uL — ABNORMAL LOW (ref 0.9–3.3)

## 2013-11-23 NOTE — Progress Notes (Signed)
Jennifer Frazier  Telephone:([phone]) [phone] Fax:(336) [phone]     ID: Jennifer Frazier DOB: 1975/02/04  MR#: [id number]  JYN#:[id number]  Patient Care Team: Sharilyn Sites, MD as PCP - General (Family Medicine) Chauncey Cruel, MD as Consulting Physician (Oncology) Thea Silversmith, MD as Consulting Physician (Radiation Oncology) [name], MD as Consulting Physician (General Surgery)   CHIEF COMPLAINT: Estrogen receptor positive stage I breast cancer  CURRENT TREATMENT: receiving adjuvant chemotherapy   BREAST CANCER HISTORY: From the original intake note:  The patient had screening mammography at [facility] showing calcifications in the right breast, and was referred to the breast Center 07/06/2013 for additional views. Right diagnostic mammography confirmed a group of amorphous calcifications in the upper outer right breast measuring 1.2 cm.  Biopsy of this area 07/17/2013 showed ([id number]) ductal carcinoma in situ, high-grade, estrogen receptor 100% positive, and progesterone receptor 96% positive, both with strong staining intensity.  The patient was then referred to surgery and on 07/25/2013 underwent bilateral breast MRI. This showed a breast density category CEA. In the right breast there was an area of clumped nodular enhancement measuring 3 cm. There was also a 1.5 cm circumscribed oval mass in the medial portion of the right breast consistent with a fibroadenoma. The left breast was unremarkable, and there were no abnormal appearing lymph nodes.  On 08/08/2013 the patient underwent right lumpectomy and sentinel lymph node sampling. The pathology ([id number] was (showed, in addition to ductal carcinoma in situ, invasive ductal carcinoma, measuring 0.7 cm, grade 3, estrogen receptor 86% positive, progesterone receptor 89% positive, both with strong staining intensity, with an MIB-1 of 62%, and no HER-2 amplification. Both sentinel lymph nodes were clear.  Margins were ample.   The patient's subsequent history is as detailed below  INTERVAL HISTORY: Jennifer Frazier returns today for follow up of her breast cancer, accompanied by her mother. Today is day 8 cycle 4 of 4 planned cycles of of cyclophosphamide and doxorubicin, with neulasta on day 2.  REVIEW OF SYSTEMS: Jennifer Frazier tolerated the first half of her chemotherapy remarkably well. She actually stopped using antinausea medicine except for an occasional prochlorperazine, in the last 2 cycles, because she was concerned regarding weight gain from the dexamethasone. She has also stopped the trazodone which was giving her bad dreams she thinks. She is very concerned about the hyperpigmentation over her hands and mouth and hopes this will not be permanent. Her sense of taste has changed and she no longer enjoys the foods she used to eat, which were very healthy. No instead she craves fast food. She has had no unusual headaches, visual changes, change in bowel or bladder habits, cough phlegm production or pleurisy. She does have a little bit of a sore throat at present. A detailed review of systems today was otherwise noncontributory.   PAST MEDICAL HISTORY: Past Medical History  Diagnosis Date  . Fibroids     uterine fibroids  . Anemia   . Thyroid disease   . Contact lens/glasses fitting     wears contacts or glasses  . Sleep apnea     has a cpap-does not always use it    PAST SURGICAL HISTORY: Past Surgical History  Procedure Laterality Date  . [place]  2012    urerine ablasion  . Eye surgery  2013    retinal tear-lazer-both  . Wisdom tooth extraction    . Breast surgery      lumpectomy 7/14  . Portacath placement Right 09/29/2013  Procedure: INSERTION PORT-A-CATH WITH ULTRA SOUND;  Surgeon: [name], MD;  Location: [facility];  Service: General;  Laterality: Right;    FAMILY HISTORY Family History  Problem Relation Age of Onset  . Cancer Father 59    colon  . Cancer  Maternal Aunt 50    breast cancer  . Cancer Paternal Aunt 71    breast cancer  . Cancer Maternal Grandmother 23    ovarian or uterine cancer  . Cancer Paternal Grandmother     unknown primary   the patient's father died at the age of 48, been diagnosed with colon cancer at the age of 61. The patient's mother is living, currently 43 years old. The patient has one brother, no sisters. One of the patient's mother's sisters was diagnosed with breast cancer at the age of 94 in one of the patient's father's mother was diagnosed with breast cancer at the age of 48. There is no history of ovarian cancer in the family.  GYNECOLOGIC HISTORY:  Patient's last menstrual period was 11/17/2013. Menarche age 14, for the patient is GX P0. She still having regular periods. 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    ADVANCED DIRECTIVES: Not in place; at the 08/24/2013 visit the patient was given the appropriate documents to come feel notarize associate may declare healthcare power of attorney.   HEALTH MAINTENANCE: History  Substance Use Topics  . Smoking status: Never Smoker   . Smokeless tobacco: Not on file  . Alcohol Use: No     Colonoscopy:  PAP:  Bone density:  Lipid panel:  Allergies  Allergen Reactions  . Gadolinium      Desc: NAUSEA WITH MRI CONTRAST-NO VOMITING   . Tramadol Hives and Nausea Only    Current Outpatient Prescriptions  Medication Sig Dispense Refill  . Cholecalciferol (VITAMIN D3) 3000 UNITS TABS Take 10,000 Units by mouth daily.      [name] dexamethasone (DECADRON) 4 MG tablet Take 2 tablets by mouth once a day on the day after chemotherapy and then take 2 tablets two times a day for 2 days. Take with food.  30 tablet  1  . ibuprofen  (ADVIL,MOTRIN) 100 MG/5ML suspension Take 200 mg by mouth every 4 (four) hours as needed.      . lidocaine-prilocaine (EMLA) cream Apply 1 application topically as needed.  30 g  0  . LORazepam (ATIVAN) 0.5 MG tablet Take 1 tablet (0.5 mg total) by mouth every 6 (six) hours as needed (Nausea or vomiting).  30 tablet  0  . ondansetron (ZOFRAN) 8 MG tablet Take 1 tablet (8 mg total) by mouth 2 (two) times daily as needed. Start on the third day after chemotherapy.  30 tablet  1  . prochlorperazine (COMPAZINE) 10 MG tablet Take 1 tablet (10 mg total) by mouth every 6 (six) hours as needed (Nausea or vomiting).  30 tablet  1  . promethazine (PHENERGAN) 25 MG tablet Take 25 mg by mouth every 6 (six) hours as needed for nausea or vomiting.      . thyroid (ARMOUR) 130 MG tablet Take 130 mg by mouth daily.      . traZODone (DESYREL) 50 MG tablet Take 1 tablet (50 mg total) by mouth at bedtime.  30 tablet  2  .  vitamin B-12 (CYANOCOBALAMIN) 1000 MCG tablet Take 5,000 mcg by mouth daily.       No current facility-administered medications for this visit.    OBJECTIVE: Middle-aged [place] American woman who appears stated age 68 Vitals:   11/23/13 1625  BP: 131/75  Pulse: 70  Temp: 97.5 F (36.4 C)  Resp: 18     Body mass index is 33.76 kg/(m^2).    ECOG FS:1 - Symptomatic but completely ambulatory  Sclerae unicteric, pupils equal and reactive Oropharynx clear and moist-- no thrush or other lesions No cervical or supraclavicular adenopathy Lungs no rales or rhonchi Heart regular rate and rhythm Abd soft, obese, nontender, positive bowel sounds MSK no focal spinal tenderness, no upper extremity lymphedema Neuro: nonfocal, well oriented, appropriate affect Breasts: The right breast is status post lumpectomy. There is no evidence of local recurrence. Right axilla is benign. The left breast is unremarkable. Skin: There is significant hyperpigmentation over the fingers and the base of the nails.  There are some spots in her oral mucosa as well. The port in the anterior right chest wall is very mobile. It feels easy to access however at least as it is positioned today. The right forearm shows obvious evidence of phlebitis from the most recent peripheral chemotherapy dose  LAB RESULTS:  CMP     Component Value Date/Time   NA 142 11/09/2013 1118   NA 140 10/12/2013 1628   K 4.2 11/09/2013 1118   K 4.3 10/12/2013 1628   CL 104 10/12/2013 1628   CO2 22 11/09/2013 1118   CO2 26 10/12/2013 1628   GLUCOSE 91 11/09/2013 1118   GLUCOSE 89 10/12/2013 1628   BUN 12.4 11/09/2013 1118   BUN 15 10/12/2013 1628   CREATININE 0.8 11/09/2013 1118   CREATININE 0.74 10/12/2013 1628   CALCIUM 10.0 11/09/2013 1118   CALCIUM 9.6 10/12/2013 1628   PROT 7.0 11/09/2013 1118   PROT 6.7 10/12/2013 1628   ALBUMIN 3.6 11/09/2013 1118   ALBUMIN 3.3* 10/12/2013 1628   AST 12 11/09/2013 1118   AST 15 10/12/2013 1628   ALT 8 11/09/2013 1118   ALT 16 10/12/2013 1628   ALKPHOS 115 11/09/2013 1118   ALKPHOS 93 10/12/2013 1628   BILITOT 0.62 11/09/2013 1118   BILITOT 0.4 10/12/2013 1628   GFRNONAA >90 08/04/2013 1245   GFRAA >90 08/04/2013 1245    I No results found for this basename: SPEP,  UPEP,   kappa and lambda light chains    Lab Results  Component Value Date   WBC 3.0* 11/23/2013   NEUTROABS 2.6 11/23/2013   HGB 9.8* 11/23/2013   HCT 30.1* 11/23/2013   MCV 81.4 11/23/2013   PLT 184 11/23/2013      Chemistry      Component Value Date/Time   NA 142 11/09/2013 1118   NA 140 10/12/2013 1628   K 4.2 11/09/2013 1118   K 4.3 10/12/2013 1628   CL 104 10/12/2013 1628   CO2 22 11/09/2013 1118   CO2 26 10/12/2013 1628   BUN 12.4 11/09/2013 1118   BUN 15 10/12/2013 1628   CREATININE 0.8 11/09/2013 1118   CREATININE 0.74 10/12/2013 1628      Component Value Date/Time   CALCIUM 10.0 11/09/2013 1118   CALCIUM 9.6 10/12/2013 1628   ALKPHOS 115 11/09/2013 1118   ALKPHOS 93 10/12/2013 1628   AST 12 11/09/2013  1118   AST 15 10/12/2013 1628   ALT 8 11/09/2013 1118   ALT 16 10/12/2013  1628   BILITOT 0.62 11/09/2013 1118   BILITOT 0.4 10/12/2013 1628       No results found for this basename: LABCA2    No components found with this basename: [id number]    No results found for this basename: INR,  in the last 168 hours  Urinalysis    Component Value Date/Time   COLORURINE YELLOW 10/21/2009 1440   APPEARANCEUR CLEAR 10/21/2009 1440   LABSPEC 1.012 10/21/2009 1440   PHURINE 6.0 10/21/2009 1440   GLUCOSEU NEGATIVE 10/21/2009 1440   HGBUR NEGATIVE 10/21/2009 1440   BILIRUBINUR NEGATIVE 10/21/2009 1440   KETONESUR 15* 10/21/2009 1440   PROTEINUR NEGATIVE 10/21/2009 1440   UROBILINOGEN 0.2 10/21/2009 1440   NITRITE NEGATIVE 10/21/2009 1440   LEUKOCYTESUR NEGATIVE MICROSCOPIC NOT DONE ON URINES WITH NEGATIVE PROTEIN, BLOOD, LEUKOCYTES, NITRITE, OR GLUCOSE <1000 mg/dL. 10/21/2009 1440    STUDIES: Dg Chest 2 View  11/02/2013   CLINICAL DATA:  Evaluation of Port-A-Cath.  Breast cancer.  EXAM: CHEST  2 VIEW  COMPARISON:  Chest CT 10/10/2013 chest radiograph 09/29/2013 ring and  FINDINGS: Right-sided IJ power Port-A-Cath is present. The distal tip projects over the expected location of the mid superior vena cava on the frontal and lateral views. The Port-A-Cath appears appropriately positioned. Heart and mediastinal contours are stable and within normal limits. Surgical clips project over the right breast and right axillary region. Negative for pleural effusion or pneumothorax. Negative for airspace disease.  Stable mild convex right scoliosis of the mid to lower thoracic spine.  IMPRESSION: Right IJ power Port-A-Cath appears appropriately positioned, and projects over the mid superior vena cava.  No acute cardiopulmonary disease.   Electronically Signed   By: Curlene Dolphin M.D.   On: 11/02/2013 14:47   Ir Patient Eval Tech 0-60 Mins  11/03/2013   Serena Croissant     11/03/2013  3:54 PM Patient came to IR with  portacath septum flipped upside down.  I  was able to manipulate the port and successfully flip the  portacath septum back into proper position.  Dr. Kathlene Cote was  notified and after looking at the previous chest xray did not  feel that a contrast injection of the catheter was needed.   Patient was instructed to sleep with head elevated and was  discharged to the cancer center.   ASSESSMENT: 38 y.o. BRCA negative [place] woman status post right breast biopsy 07/17/2013 for high-grade ductal carcinoma in situ, estrogen and progesterone receptor positive  (1) status post right lumpectomy and sentinel lymph node sampling 08/08/2013 for a pT1b pN0, stage IA invasive ductal carcinoma, grade 3, estrogen and progesterone receptor positive, with an MIB-1 of 62% and no HER-2 amplification  (2) genetics sent 08/02/2013 was normal but did identify a variant of uncertain significance called BRCA2, [id number].  (3) Oncotype score of 23 predicts a risk of outside the breast recurrence within 10 years of 15% if the patient's only systemic treatment is tamoxifen for 5 years. Adjuvant chemotherapy was recommended  (4) doxorubicin and cyclophosphamide in dose dense fashion x4 with Neulasta support to start 10/05/2013, to be followed by paclitaxel weekly x12  (5) adjuvant radiation to follow chemotherapy  (6) anti-estrogens for 5-10 years to follow radiation  (7) "flipped port:" The port head in Santrice's anterior chest appeared unusually mobile. If we are not able to access it reliably what will have to be replaced.  PLAN: Kanyia tolerated the cyclophosphamide and doxorubicin generally well. Today we spent approximately 55 minutes discussing the  possible toxicities, complications and side effects of paclitaxel. She understands the risks of first dose immune reaction, which is the reason we premed with dexamethasone. I will be dropping the dexamethasone doses over the next 2 cycles and if she tolerates it well her  eventual premed dose will be 4 mg. She also has a good understanding of the possibility of neuropathy, which may be permanent.  I have suggested she not take dexamethasone orally for nausea, but use Zofran instead, with promethazine as a backup. The reason for this is not only that paclitaxel is much less emetogenic then doxorubicin, but also that she is very concerned about weight gain.  We have had to give her the last 2 treatments peripherally. She has significant phlebitis in the right arm as a result. I suggested she use some ice for that, but if we cannot get into her port it will have to be replaced. I would not give her any further peripheral treatments.  Levaeh has a good understanding of the overall plan. She agrees with it. She knows a goal of treatment in her case is cure. She will call with any problems that may develop before her next visit here.  Chauncey Cruel, MD   11/23/2013 4:26 PM

## 2013-11-27 ENCOUNTER — Encounter: Payer: Self-pay | Admitting: Nurse Practitioner

## 2013-11-30 ENCOUNTER — Ambulatory Visit (HOSPITAL_BASED_OUTPATIENT_CLINIC_OR_DEPARTMENT_OTHER): Payer: 59 | Admitting: Nurse Practitioner

## 2013-11-30 ENCOUNTER — Encounter: Payer: Self-pay | Admitting: Nurse Practitioner

## 2013-11-30 ENCOUNTER — Other Ambulatory Visit (HOSPITAL_BASED_OUTPATIENT_CLINIC_OR_DEPARTMENT_OTHER): Payer: 59

## 2013-11-30 ENCOUNTER — Telehealth: Payer: Self-pay | Admitting: *Deleted

## 2013-11-30 ENCOUNTER — Telehealth: Payer: Self-pay | Admitting: Nurse Practitioner

## 2013-11-30 ENCOUNTER — Ambulatory Visit (HOSPITAL_BASED_OUTPATIENT_CLINIC_OR_DEPARTMENT_OTHER): Payer: 59

## 2013-11-30 VITALS — BP 146/93 | HR 75 | Temp 98.3°F | Resp 18 | Ht 69.0 in | Wt 231.0 lb

## 2013-11-30 DIAGNOSIS — C50411 Malignant neoplasm of upper-outer quadrant of right female breast: Secondary | ICD-10-CM

## 2013-11-30 DIAGNOSIS — Z800 Family history of malignant neoplasm of digestive organs: Secondary | ICD-10-CM

## 2013-11-30 DIAGNOSIS — D0510 Intraductal carcinoma in situ of unspecified breast: Secondary | ICD-10-CM

## 2013-11-30 DIAGNOSIS — Z803 Family history of malignant neoplasm of breast: Secondary | ICD-10-CM

## 2013-11-30 DIAGNOSIS — Z170 Estrogen receptor positive status [ER+]: Secondary | ICD-10-CM

## 2013-11-30 DIAGNOSIS — Z5111 Encounter for antineoplastic chemotherapy: Secondary | ICD-10-CM

## 2013-11-30 LAB — CBC WITH DIFFERENTIAL/PLATELET
BASO%: 0.5 % (ref 0.0–2.0)
Basophils Absolute: 0 10*3/uL (ref 0.0–0.1)
EOS%: 0.7 % (ref 0.0–7.0)
Eosinophils Absolute: 0 10*3/uL (ref 0.0–0.5)
HCT: 34.5 % — ABNORMAL LOW (ref 34.8–46.6)
HGB: 11.3 g/dL — ABNORMAL LOW (ref 11.6–15.9)
LYMPH%: 10.3 % — ABNORMAL LOW (ref 14.0–49.7)
MCH: 27 pg (ref 25.1–34.0)
MCHC: 32.8 g/dL (ref 31.5–36.0)
MCV: 82.3 fL (ref 79.5–101.0)
MONO#: 0.9 10*3/uL (ref 0.1–0.9)
MONO%: 16.2 % — ABNORMAL HIGH (ref 0.0–14.0)
NEUT#: 4.1 10*3/uL (ref 1.5–6.5)
NEUT%: 72.3 % (ref 38.4–76.8)
NRBC: 2 % — AB (ref 0–0)
PLATELETS: 173 10*3/uL (ref 145–400)
RBC: 4.19 10*6/uL (ref 3.70–5.45)
RDW: 17.8 % — AB (ref 11.2–14.5)
WBC: 5.6 10*3/uL (ref 3.9–10.3)
lymph#: 0.6 10*3/uL — ABNORMAL LOW (ref 0.9–3.3)

## 2013-11-30 MED ORDER — ONDANSETRON 8 MG/NS 50 ML IVPB
INTRAVENOUS | Status: AC
  Filled 2013-11-30: qty 8

## 2013-11-30 MED ORDER — FAMOTIDINE IN NACL 20-0.9 MG/50ML-% IV SOLN
20.0000 mg | Freq: Once | INTRAVENOUS | Status: AC
  Administered 2013-11-30: 20 mg via INTRAVENOUS

## 2013-11-30 MED ORDER — DIPHENHYDRAMINE HCL 50 MG/ML IJ SOLN
INTRAMUSCULAR | Status: AC
  Filled 2013-11-30: qty 1

## 2013-11-30 MED ORDER — SODIUM CHLORIDE 0.9 % IV SOLN
Freq: Once | INTRAVENOUS | Status: AC
  Administered 2013-11-30: 13:00:00 via INTRAVENOUS

## 2013-11-30 MED ORDER — ONDANSETRON 8 MG/50ML IVPB (CHCC)
8.0000 mg | Freq: Once | INTRAVENOUS | Status: AC
  Administered 2013-11-30: 8 mg via INTRAVENOUS

## 2013-11-30 MED ORDER — DEXAMETHASONE SODIUM PHOSPHATE 20 MG/5ML IJ SOLN
INTRAMUSCULAR | Status: AC
  Filled 2013-11-30: qty 5

## 2013-11-30 MED ORDER — ONDANSETRON 16 MG/50ML IVPB (CHCC)
INTRAVENOUS | Status: AC
  Filled 2013-11-30: qty 16

## 2013-11-30 MED ORDER — DIPHENHYDRAMINE HCL 50 MG/ML IJ SOLN
25.0000 mg | Freq: Once | INTRAMUSCULAR | Status: AC
  Administered 2013-11-30: 25 mg via INTRAVENOUS

## 2013-11-30 MED ORDER — DEXAMETHASONE SODIUM PHOSPHATE 20 MG/5ML IJ SOLN
20.0000 mg | Freq: Once | INTRAMUSCULAR | Status: AC
  Administered 2013-11-30: 20 mg via INTRAVENOUS

## 2013-11-30 MED ORDER — FAMOTIDINE IN NACL 20-0.9 MG/50ML-% IV SOLN
INTRAVENOUS | Status: AC
  Filled 2013-11-30: qty 50

## 2013-11-30 MED ORDER — PACLITAXEL CHEMO INJECTION 300 MG/50ML
80.0000 mg/m2 | Freq: Once | INTRAVENOUS | Status: AC
  Administered 2013-11-30: 180 mg via INTRAVENOUS
  Filled 2013-11-30: qty 30

## 2013-11-30 MED ORDER — HEPARIN SOD (PORK) LOCK FLUSH 100 UNIT/ML IV SOLN
500.0000 [IU] | Freq: Once | INTRAVENOUS | Status: AC | PRN
  Administered 2013-11-30: 500 [IU]
  Filled 2013-11-30: qty 5

## 2013-11-30 MED ORDER — SODIUM CHLORIDE 0.9 % IJ SOLN
10.0000 mL | INTRAMUSCULAR | Status: DC | PRN
  Administered 2013-11-30: 10 mL
  Filled 2013-11-30: qty 10

## 2013-11-30 NOTE — Progress Notes (Signed)
[place]  Telephone:(336) [phone] Fax:(336) [phone]     ID: Jennifer Frazier DOB: 02-09-1975  MR#: [id number]  TOI#:[id number]  Patient Care Team: Sharilyn Sites, MD as PCP - General (Family Medicine) Chauncey Cruel, MD as Consulting Physician (Oncology) Thea Silversmith, MD as Consulting Physician (Radiation Oncology) [name], MD as Consulting Physician (General Surgery)   CHIEF COMPLAINT: Estrogen receptor positive stage I breast cancer  CURRENT TREATMENT: receiving adjuvant chemotherapy   BREAST CANCER HISTORY: From the original intake note:  The patient had screening mammography at [facility] showing calcifications in the right breast, and was referred to the breast Center 07/06/2013 for additional views. Right diagnostic mammography confirmed a group of amorphous calcifications in the upper outer right breast measuring 1.2 cm.  Biopsy of this area 07/17/2013 showed ([id number]) ductal carcinoma in situ, high-grade, estrogen receptor 100% positive, and progesterone receptor 96% positive, both with strong staining intensity.  The patient was then referred to surgery and on 07/25/2013 underwent bilateral breast MRI. This showed a breast density category CEA. In the right breast there was an area of clumped nodular enhancement measuring 3 cm. There was also a 1.5 cm circumscribed oval mass in the medial portion of the right breast consistent with a fibroadenoma. The left breast was unremarkable, and there were no abnormal appearing lymph nodes.  On 08/08/2013 the patient underwent right lumpectomy and sentinel lymph node sampling. The pathology ([id number] was (showed, in addition to ductal carcinoma in situ, invasive ductal carcinoma, measuring 0.7 cm, grade 3, estrogen receptor 86% positive, progesterone receptor 89% positive, both with strong staining intensity, with an MIB-1 of 62%, and no HER-2 amplification. Both sentinel lymph nodes were clear.  Margins were ample.   The patient's subsequent history is as detailed below  INTERVAL HISTORY: Jennifer Frazier returns today for follow up of her breast cancer, accompanied by her mother. Today is day 1 cycle 1 of 12 planned cycles of paclitaxel. The sore throat she had last week has resolved. She has started walking on the treadmill daily to hopefully lose some weight, gained from steroid use.   REVIEW OF SYSTEMS: Jennifer Frazier tolerated the first half of her chemotherapy remarkably well. She actually stopped using antinausea medicine except for an occasional prochlorperazine, in the last 2 cycles, because she was concerned regarding weight gain from the dexamethasone. She has also stopped the trazodone which was giving her bad dreams she thinks. She is very concerned about the hyperpigmentation over her hands and mouth and hopes this will not be permanent. Her sense of taste has changed and she no longer enjoys the foods she used to eat, which were very healthy. No instead she craves fast food. She has had no unusual headaches, visual changes, change in bowel or bladder habits, cough phlegm production or pleurisy. A detailed review of systems today was otherwise noncontributory.   PAST MEDICAL HISTORY: Past Medical History  Diagnosis Date  . Fibroids     uterine fibroids  . Anemia   . Thyroid disease   . Contact lens/glasses fitting     wears contacts or glasses  . Sleep apnea     has a cpap-does not always use it    PAST SURGICAL HISTORY: Past Surgical History  Procedure Laterality Date  . [place]  2012    urerine ablasion  . Eye surgery  2013    retinal tear-lazer-both  . Wisdom tooth extraction    . Breast surgery      lumpectomy  7/14  . Portacath placement Right 09/29/2013    Procedure: INSERTION PORT-A-CATH WITH ULTRA SOUND;  Surgeon: [name], MD;  Location: [facility];  Service: General;  Laterality: Right;    FAMILY HISTORY Family History  Problem Relation Age of Onset    . Cancer Father 66    colon  . Cancer Maternal Aunt 50    breast cancer  . Cancer Paternal Aunt 18    breast cancer  . Cancer Maternal Grandmother 65    ovarian or uterine cancer  . Cancer Paternal Grandmother     unknown primary   the patient's father died at the age of 98, been diagnosed with colon cancer at the age of 31. The patient's mother is living, currently 63 years old. The patient has one brother, no sisters. One of the patient's mother's sisters was diagnosed with breast cancer at the age of 16 in one of the patient's father's mother was diagnosed with breast cancer at the age of 55. There is no history of ovarian cancer in the family.  GYNECOLOGIC HISTORY:  Patient's last menstrual period was 11/17/2013. Menarche age 68, for the patient is GX P0. She still having regular periods. 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    ADVANCED DIRECTIVES: Not in place; at the 08/24/2013 visit the patient was given the appropriate documents to come feel notarize associate may declare healthcare power of attorney.   HEALTH MAINTENANCE: History  Substance Use Topics  . Smoking status: Never Smoker   . Smokeless tobacco: Not on file  . Alcohol Use: No     Colonoscopy:  PAP:  Bone density:  Lipid panel:  Allergies  Allergen Reactions  . Gadolinium      Desc: NAUSEA WITH MRI CONTRAST-NO VOMITING   . Tramadol Hives and Nausea Only    Current Outpatient Prescriptions  Medication Sig Dispense Refill  . Cholecalciferol (VITAMIN D3) 3000 UNITS TABS Take 10,000 Units by mouth daily.    [name] ibuprofen (ADVIL,MOTRIN) 100 MG/5ML suspension Take 200 mg by mouth every 4 (four) hours as needed.    . lidocaine-prilocaine (EMLA) cream Apply 1 application  topically as needed. 30 g 0  . promethazine (PHENERGAN) 25 MG tablet Take 25 mg by mouth every 6 (six) hours as needed for nausea or vomiting.    . thyroid (ARMOUR) 130 MG tablet Take 130 mg by mouth daily.    . vitamin B-12 (CYANOCOBALAMIN) 1000 MCG tablet Take 5,000 mcg by mouth daily.    . traZODone (DESYREL) 50 MG tablet Take 1 tablet (50 mg total) by mouth at bedtime. 30 tablet 2   No current facility-administered medications for this visit.   Facility-Administered Medications Ordered in Other Visits  Medication Dose Route Frequency Provider Last Rate Last Dose  . famotidine (PEPCID) IVPB 20 mg  20 mg Intravenous Once Chauncey Cruel, MD      . heparin lock flush 100 unit/mL  500 Units Intracatheter Once PRN Chauncey Cruel, MD      . ondansetron (ZOFRAN) IVPB 8 mg  8 mg Intravenous Once Chauncey Cruel, MD   8 mg at 11/30/13 1305  . PACLitaxel (TAXOL) 180 mg in dextrose 5 % 250 mL chemo infusion (</= 43m/m2)  80 mg/m2 (Treatment Plan Actual) Intravenous Once GVirgie Dad  Magrinat, MD      . sodium chloride 0.9 % injection 10 mL  10 mL Intracatheter PRN Chauncey Cruel, MD        OBJECTIVE: Middle-aged [place] American woman who appears stated age 71 Vitals:   11/30/13 1157  BP: 146/93  Pulse: 75  Temp: 98.3 F (36.8 C)  Resp: 18     Body mass index is 34.1 kg/(m^2).    ECOG FS:1 - Symptomatic but completely ambulatory   Skin: warm, dry, hyperpigmentation to palms, base of nails, and along oral mucosa.  HEENT: sclerae anicteric, conjunctivae pink, oropharynx clear. No thrush or mucositis.  Lymph Nodes: No cervical or supraclavicular lymphadenopathy  Lungs: clear to auscultation bilaterally, no rales, wheezes, or rhonci  Heart: regular rate and rhythm  Abdomen: round, soft, non tender, positive bowel sounds  Musculoskeletal: No focal spinal tenderness, no peripheral edema  Neuro: non focal, well oriented, positive affect  Breasts: deferred.   LAB RESULTS:  CMP       Component Value Date/Time   NA 138 11/23/2013 1613   NA 140 10/12/2013 1628   K 4.0 11/23/2013 1613   K 4.3 10/12/2013 1628   CL 104 10/12/2013 1628   CO2 27 11/23/2013 1613   CO2 26 10/12/2013 1628   GLUCOSE 93 11/23/2013 1613   GLUCOSE 89 10/12/2013 1628   BUN 12.4 11/23/2013 1613   BUN 15 10/12/2013 1628   CREATININE 0.7 11/23/2013 1613   CREATININE 0.74 10/12/2013 1628   CALCIUM 9.9 11/23/2013 1613   CALCIUM 9.6 10/12/2013 1628   PROT 6.7 11/23/2013 1613   PROT 6.7 10/12/2013 1628   ALBUMIN 3.7 11/23/2013 1613   ALBUMIN 3.3* 10/12/2013 1628   AST 12 11/23/2013 1613   AST 15 10/12/2013 1628   ALT 9 11/23/2013 1613   ALT 16 10/12/2013 1628   ALKPHOS 109 11/23/2013 1613   ALKPHOS 93 10/12/2013 1628   BILITOT 0.35 11/23/2013 1613   BILITOT 0.4 10/12/2013 1628   GFRNONAA >90 08/04/2013 1245   GFRAA >90 08/04/2013 1245    I No results found for: SPEP  Lab Results  Component Value Date   WBC 5.6 11/30/2013   NEUTROABS 4.1 11/30/2013   HGB 11.3* 11/30/2013   HCT 34.5* 11/30/2013   MCV 82.3 11/30/2013   PLT 173 11/30/2013      Chemistry      Component Value Date/Time   NA 138 11/23/2013 1613   NA 140 10/12/2013 1628   K 4.0 11/23/2013 1613   K 4.3 10/12/2013 1628   CL 104 10/12/2013 1628   CO2 27 11/23/2013 1613   CO2 26 10/12/2013 1628   BUN 12.4 11/23/2013 1613   BUN 15 10/12/2013 1628   CREATININE 0.7 11/23/2013 1613   CREATININE 0.74 10/12/2013 1628      Component Value Date/Time   CALCIUM 9.9 11/23/2013 1613   CALCIUM 9.6 10/12/2013 1628   ALKPHOS 109 11/23/2013 1613   ALKPHOS 93 10/12/2013 1628   AST 12 11/23/2013 1613   AST 15 10/12/2013 1628   ALT 9 11/23/2013 1613   ALT 16 10/12/2013 1628   BILITOT 0.35 11/23/2013 1613   BILITOT 0.4 10/12/2013 1628       No results found for: LABCA2  No components found for: LABCA125  No results for input(s): INR in the last 168 hours.  Urinalysis    Component Value Date/Time   COLORURINE  YELLOW 10/21/2009 1440   APPEARANCEUR CLEAR 10/21/2009 1440   LABSPEC 1.012 10/21/2009 1440  PHURINE 6.0 10/21/2009 1440   GLUCOSEU NEGATIVE 10/21/2009 1440   HGBUR NEGATIVE 10/21/2009 1440   BILIRUBINUR NEGATIVE 10/21/2009 1440   KETONESUR 15* 10/21/2009 1440   PROTEINUR NEGATIVE 10/21/2009 1440   UROBILINOGEN 0.2 10/21/2009 1440   NITRITE NEGATIVE 10/21/2009 1440   LEUKOCYTESUR  10/21/2009 1440    NEGATIVE MICROSCOPIC NOT DONE ON URINES WITH NEGATIVE PROTEIN, BLOOD, LEUKOCYTES, NITRITE, OR GLUCOSE <1000 mg/dL.    STUDIES: Dg Chest 2 View  11/02/2013   CLINICAL DATA:  Evaluation of Port-A-Cath.  Breast cancer.  EXAM: CHEST  2 VIEW  COMPARISON:  Chest CT 10/10/2013 chest radiograph 09/29/2013 ring and  FINDINGS: Right-sided IJ power Port-A-Cath is present. The distal tip projects over the expected location of the mid superior vena cava on the frontal and lateral views. The Port-A-Cath appears appropriately positioned. Heart and mediastinal contours are stable and within normal limits. Surgical clips project over the right breast and right axillary region. Negative for pleural effusion or pneumothorax. Negative for airspace disease.  Stable mild convex right scoliosis of the mid to lower thoracic spine.  IMPRESSION: Right IJ power Port-A-Cath appears appropriately positioned, and projects over the mid superior vena cava.  No acute cardiopulmonary disease.   Electronically Signed   By: Curlene Dolphin M.D.   On: 11/02/2013 14:47   Ir Cv Line Injection  11/17/2013   CLINICAL DATA:  38 year old female with estrogen receptor positive stage I breast cancer now status post right lumpectomy and sentinel lymph node biopsy. A left subclavian approach port a catheter was placed for chemotherapy. The patient was seen in interventional Radiology on October 9th for port catheter injection. Evaluation under fluoroscopy demonstrated that the port catheter had flipped posteriorly. This was successfully  manipulated with in the subcutaneous pocket back into the appropriate position.  Unfortunately, when the patient presented to chemotherapy this week the port a catheter again was not accessible. She presents to Interventional Radiology for repeat port evaluation.  EXAM: CENTRAL VENOUS CATHETER  Date: 11/17/2013  PROCEDURE: 1. Successful manipulation of port a catheter back into the normal position. 2. Port a catheter injection under fluoroscopy Interventional Radiologist:  Criselda Peaches, MD  ANESTHESIA/SEDATION: None.  FLUOROSCOPY TIME:  6 seconds  73 mGy  CONTRAST:  15 mL Omnipaque 300  TECHNIQUE: Informed consent was obtained from the patient following explanation of the procedure, risks, benefits and alternatives. The patient understands, agrees and consents for the procedure. All questions were addressed. A time out was performed.  The port a catheter was palpated and felt to have flipped again and a pocket. This was successfully manipulated back and in the normal position.  The skin overlying the port catheter was then sterilely prepped using alcohol skin prep 's. A was accessed with a Huber needle. A port catheter injection was performed. This demonstrates a right IJ approach port catheter. The catheter tip lies in the upper right atrium. No evidence of thrombin sheath or thrombus. The catheter flushes and aspirates well. The poor was de accessed.  IMPRESSION: 1. Recurrent malpositioning of the port a catheter reservoir within the subcutaneous pocket. The port reservoir has again flipped posteriorly. 2. Successful manipulation of the port reservoir back into the normal position. 3. Port catheter injection demonstrates a well-functioning right IJ approach port a catheter. Surgical revision may be required for placement of new or additional retention sutures to prevent continued malpositioning of the reservoir within the reservoir pocket.  Signed,  Criselda Peaches, MD  [facility]  [facility]   Electronically Signed   By: Jacqulynn Cadet M.D.   On: 11/17/2013 16:57   Ir Patient Eval Tech 0-60 Mins  11/03/2013   Serena Croissant     11/03/2013  3:54 PM Patient came to IR with portacath septum flipped upside down.  I  was able to manipulate the port and successfully flip the  portacath septum back into proper position.  Dr. Kathlene Cote was  notified and after looking at the previous chest xray did not  feel that a contrast injection of the catheter was needed.   Patient was instructed to sleep with head elevated and was  discharged to the cancer center.   ASSESSMENT: 38 y.o. BRCA negative [place] woman status post right breast biopsy 07/17/2013 for high-grade ductal carcinoma in situ, estrogen and progesterone receptor positive  (1) status post right lumpectomy and sentinel lymph node sampling 08/08/2013 for a pT1b pN0, stage IA invasive ductal carcinoma, grade 3, estrogen and progesterone receptor positive, with an MIB-1 of 62% and no HER-2 amplification  (2) genetics sent 08/02/2013 was normal but did identify a variant of uncertain significance called BRCA2, [id number].  (3) Oncotype score of 23 predicts a risk of outside the breast recurrence within 10 years of 15% if the patient's only systemic treatment is tamoxifen for 5 years. Adjuvant chemotherapy was recommended  (4) doxorubicin and cyclophosphamide in dose dense fashion x4 with Neulasta support to start 10/05/2013, to be followed by paclitaxel weekly x12  (5) adjuvant radiation to follow chemotherapy  (6) anti-estrogens for 5-10 years to follow radiation  (7) "flipped port:" The port head in Shamaine's anterior chest appeared unusually mobile. If we are not able to access it reliably what will have to be replaced.  PLAN: The labs were reviewed in detail and were stable. Shivaun is ready to proceed with her first cycle of paclitaxel. This week we will administer the full dose of  premedications. Over the next 2 cycles we anticipate dropping the IV dexamethasone dose from 42m to 839mand eventually 34m51mprovided she tolerates the chemotherapy well. As discussed previously she will not take oral dexamethasone but will use zofran instead, with promethazine for back up.   I am having Dr. CorJosetta Huddlefice contact WenAbigail Buttsout revising her port. The last 2 treatments of chemo were given peripherally due to the port being "flipped" in her chest.   WenNatahliall return next week for labs, an office visit, and the start of cycle 2. She understands and agrees with this plan. She knows the goal of treatment in her case is cure. She has been encouraged to call with any issues that might arise before her next visit here.   FerMarcelino DusterP   11/30/2013 1:18 PM

## 2013-11-30 NOTE — Telephone Encounter (Signed)
Per staff message and POF I have scheduled appts. Advised scheduler of appts. JMW  

## 2013-11-30 NOTE — Patient Instructions (Signed)
[place] Discharge Instructions for Patients Receiving Chemotherapy  Today you received the following chemotherapy agents Paclitaxel.  To help prevent nausea and vomiting after your treatment, we encourage you to take your nausea medication as directed.    If you develop nausea and vomiting that is not controlled by your nausea medication, call the clinic.   BELOW ARE SYMPTOMS THAT SHOULD BE REPORTED IMMEDIATELY:  *FEVER GREATER THAN 100.5 F  *CHILLS WITH OR WITHOUT FEVER  NAUSEA AND VOMITING THAT IS NOT CONTROLLED WITH YOUR NAUSEA MEDICATION  *UNUSUAL SHORTNESS OF BREATH  *UNUSUAL BRUISING OR BLEEDING  TENDERNESS IN MOUTH AND THROAT WITH OR WITHOUT PRESENCE OF ULCERS  *URINARY PROBLEMS  *BOWEL PROBLEMS  UNUSUAL RASH Items with * indicate a potential emergency and should be followed up as soon as possible.  Feel free to call the clinic you have any questions or concerns. The clinic phone number is [phone].  Paclitaxel injection What is this medicine? PACLITAXEL (PAK li TAX el) is a chemotherapy drug. It targets fast dividing cells, like cancer cells, and causes these cells to die. This medicine is used to treat ovarian cancer, breast cancer, and other cancers. This medicine may be used for other purposes; ask your health care provider or pharmacist if you have questions. COMMON BRAND NAME(S): Onxol, Taxol What should I tell my health care provider before I take this medicine? They need to know if you have any of these conditions: -blood disorders -irregular heartbeat -infection (especially a virus infection such as chickenpox, cold sores, or herpes) -liver disease -previous or ongoing radiation therapy -an unusual or allergic reaction to paclitaxel, alcohol, polyoxyethylated castor oil, other chemotherapy agents, other medicines, foods, dyes, or preservatives -pregnant or trying to get pregnant -breast-feeding How should I use this  medicine? This drug is given as an infusion into a vein. It is administered in a hospital or clinic by a specially trained health care professional. Talk to your pediatrician regarding the use of this medicine in children. Special care may be needed. Overdosage: If you think you have taken too much of this medicine contact a poison control center or emergency room at once. NOTE: This medicine is only for you. Do not share this medicine with others. What if I miss a dose? It is important not to miss your dose. Call your doctor or health care professional if you are unable to keep an appointment. What may interact with this medicine? Do not take this medicine with any of the following medications: -disulfiram -metronidazole This medicine may also interact with the following medications: -cyclosporine -diazepam -ketoconazole -medicines to increase blood counts like filgrastim, pegfilgrastim, sargramostim -other chemotherapy drugs like cisplatin, doxorubicin, epirubicin, etoposide, teniposide, vincristine -quinidine -testosterone -vaccines -verapamil Talk to your doctor or health care professional before taking any of these medicines: -acetaminophen -aspirin -ibuprofen -ketoprofen -naproxen This list may not describe all possible interactions. Give your health care provider a list of all the medicines, herbs, non-prescription drugs, or dietary supplements you use. Also tell them if you smoke, drink alcohol, or use illegal drugs. Some items may interact with your medicine. What should I watch for while using this medicine? Your condition will be monitored carefully while you are receiving this medicine. You will need important blood work done while you are taking this medicine. This drug may make you feel generally unwell. This is not uncommon, as chemotherapy can affect healthy cells as well as cancer cells. Report any side effects. Continue your course of treatment  even though you feel ill  unless your doctor tells you to stop. In some cases, you may be given additional medicines to help with side effects. Follow all directions for their use. Call your doctor or health care professional for advice if you get a fever, chills or sore throat, or other symptoms of a cold or flu. Do not treat yourself. This drug decreases your body's ability to fight infections. Try to avoid being around people who are sick. This medicine may increase your risk to bruise or bleed. Call your doctor or health care professional if you notice any unusual bleeding. Be careful brushing and flossing your teeth or using a toothpick because you may get an infection or bleed more easily. If you have any dental work done, tell your dentist you are receiving this medicine. Avoid taking products that contain aspirin, acetaminophen, ibuprofen, naproxen, or ketoprofen unless instructed by your doctor. These medicines may hide a fever. Do not become pregnant while taking this medicine. Women should inform their doctor if they wish to become pregnant or think they might be pregnant. There is a potential for serious side effects to an unborn child. Talk to your health care professional or pharmacist for more information. Do not breast-feed an infant while taking this medicine. Men are advised not to father a child while receiving this medicine. What side effects may I notice from receiving this medicine? Side effects that you should report to your doctor or health care professional as soon as possible: -allergic reactions like skin rash, itching or hives, swelling of the face, lips, or tongue -low blood counts - This drug may decrease the number of white blood cells, red blood cells and platelets. You may be at increased risk for infections and bleeding. -signs of infection - fever or chills, cough, sore throat, pain or difficulty passing urine -signs of decreased platelets or bleeding - bruising, pinpoint red spots on the skin,  black, tarry stools, nosebleeds -signs of decreased red blood cells - unusually weak or tired, fainting spells, lightheadedness -breathing problems -chest pain -high or low blood pressure -mouth sores -nausea and vomiting -pain, swelling, redness or irritation at the injection site -pain, tingling, numbness in the hands or feet -slow or irregular heartbeat -swelling of the ankle, feet, hands Side effects that usually do not require medical attention (report to your doctor or health care professional if they continue or are bothersome): -bone pain -complete hair loss including hair on your head, underarms, pubic hair, eyebrows, and eyelashes -changes in the color of fingernails -diarrhea -loosening of the fingernails -loss of appetite -muscle or joint pain -red flush to skin -sweating This list may not describe all possible side effects. Call your doctor for medical advice about side effects. You may report side effects to FDA at 1-800-FDA-1088. Where should I keep my medicine? This drug is given in a hospital or clinic and will not be stored at home. NOTE: This sheet is a summary. It may not cover all possible information. If you have questions about this medicine, talk to your doctor, pharmacist, or health care provider.  2015, [employer] Standard. (2012-03-07 16:41:21)

## 2013-11-30 NOTE — Telephone Encounter (Signed)
, °

## 2013-12-01 ENCOUNTER — Telehealth: Payer: Self-pay | Admitting: *Deleted

## 2013-12-01 NOTE — Telephone Encounter (Signed)
-----   Message from Ronnette Juniper, RN sent at 11/30/2013  3:17 PM EST ----- Regarding: 1st treatment 1st treatment: Paclitaxel: Dr. Jana Hakim.  [phone] (H) [phone] (M)  Received (A/C) in the past.

## 2013-12-01 NOTE — Telephone Encounter (Signed)
Left message to call- follow up on chemo

## 2013-12-04 ENCOUNTER — Telehealth: Payer: Self-pay | Admitting: Nurse Practitioner

## 2013-12-04 NOTE — Telephone Encounter (Signed)
, °

## 2013-12-07 ENCOUNTER — Encounter: Payer: Self-pay | Admitting: Nurse Practitioner

## 2013-12-07 ENCOUNTER — Ambulatory Visit (HOSPITAL_BASED_OUTPATIENT_CLINIC_OR_DEPARTMENT_OTHER): Payer: 59

## 2013-12-07 ENCOUNTER — Other Ambulatory Visit (HOSPITAL_BASED_OUTPATIENT_CLINIC_OR_DEPARTMENT_OTHER): Payer: 59

## 2013-12-07 ENCOUNTER — Ambulatory Visit (HOSPITAL_BASED_OUTPATIENT_CLINIC_OR_DEPARTMENT_OTHER): Payer: 59 | Admitting: Nurse Practitioner

## 2013-12-07 ENCOUNTER — Other Ambulatory Visit: Payer: Self-pay | Admitting: *Deleted

## 2013-12-07 VITALS — BP 146/93 | HR 82 | Temp 98.2°F | Resp 18 | Ht 69.0 in | Wt 228.5 lb

## 2013-12-07 DIAGNOSIS — C50411 Malignant neoplasm of upper-outer quadrant of right female breast: Secondary | ICD-10-CM

## 2013-12-07 DIAGNOSIS — Z803 Family history of malignant neoplasm of breast: Secondary | ICD-10-CM

## 2013-12-07 DIAGNOSIS — D0510 Intraductal carcinoma in situ of unspecified breast: Secondary | ICD-10-CM

## 2013-12-07 DIAGNOSIS — Z170 Estrogen receptor positive status [ER+]: Secondary | ICD-10-CM

## 2013-12-07 DIAGNOSIS — Z800 Family history of malignant neoplasm of digestive organs: Secondary | ICD-10-CM

## 2013-12-07 DIAGNOSIS — Z5111 Encounter for antineoplastic chemotherapy: Secondary | ICD-10-CM

## 2013-12-07 LAB — CBC WITH DIFFERENTIAL/PLATELET
BASO%: 1 % (ref 0.0–2.0)
Basophils Absolute: 0.1 10*3/uL (ref 0.0–0.1)
EOS%: 0.5 % (ref 0.0–7.0)
Eosinophils Absolute: 0 10*3/uL (ref 0.0–0.5)
HCT: 34.1 % — ABNORMAL LOW (ref 34.8–46.6)
HGB: 11 g/dL — ABNORMAL LOW (ref 11.6–15.9)
LYMPH#: 0.5 10*3/uL — AB (ref 0.9–3.3)
LYMPH%: 9.7 % — ABNORMAL LOW (ref 14.0–49.7)
MCH: 26.6 pg (ref 25.1–34.0)
MCHC: 32.1 g/dL (ref 31.5–36.0)
MCV: 82.9 fL (ref 79.5–101.0)
MONO#: 0.3 10*3/uL (ref 0.1–0.9)
MONO%: 5.2 % (ref 0.0–14.0)
NEUT#: 4.6 10*3/uL (ref 1.5–6.5)
NEUT%: 83.6 % — ABNORMAL HIGH (ref 38.4–76.8)
Platelets: 293 10*3/uL (ref 145–400)
RBC: 4.12 10*6/uL (ref 3.70–5.45)
RDW: 19.5 % — AB (ref 11.2–14.5)
WBC: 5.5 10*3/uL (ref 3.9–10.3)

## 2013-12-07 LAB — COMPREHENSIVE METABOLIC PANEL (CC13)
ALBUMIN: 3.9 g/dL (ref 3.5–5.0)
ALT: 18 U/L (ref 0–55)
AST: 20 U/L (ref 5–34)
Alkaline Phosphatase: 77 U/L (ref 40–150)
Anion Gap: 7 mEq/L (ref 3–11)
BUN: 21.4 mg/dL (ref 7.0–26.0)
CHLORIDE: 109 meq/L (ref 98–109)
CO2: 24 mEq/L (ref 22–29)
Calcium: 9.9 mg/dL (ref 8.4–10.4)
Creatinine: 0.8 mg/dL (ref 0.6–1.1)
Glucose: 83 mg/dl (ref 70–140)
POTASSIUM: 3.8 meq/L (ref 3.5–5.1)
SODIUM: 140 meq/L (ref 136–145)
TOTAL PROTEIN: 7.1 g/dL (ref 6.4–8.3)
Total Bilirubin: 0.42 mg/dL (ref 0.20–1.20)

## 2013-12-07 MED ORDER — PACLITAXEL CHEMO INJECTION 300 MG/50ML
80.0000 mg/m2 | Freq: Once | INTRAVENOUS | Status: AC
  Administered 2013-12-07: 180 mg via INTRAVENOUS
  Filled 2013-12-07: qty 30

## 2013-12-07 MED ORDER — FAMOTIDINE IN NACL 20-0.9 MG/50ML-% IV SOLN
INTRAVENOUS | Status: AC
  Filled 2013-12-07: qty 50

## 2013-12-07 MED ORDER — DEXAMETHASONE SODIUM PHOSPHATE 10 MG/ML IJ SOLN
10.0000 mg | Freq: Once | INTRAMUSCULAR | Status: AC
  Administered 2013-12-07: 10 mg via INTRAVENOUS

## 2013-12-07 MED ORDER — DIPHENHYDRAMINE HCL 50 MG/ML IJ SOLN
25.0000 mg | Freq: Once | INTRAMUSCULAR | Status: AC
  Administered 2013-12-07: 25 mg via INTRAVENOUS

## 2013-12-07 MED ORDER — ONDANSETRON 8 MG/NS 50 ML IVPB
INTRAVENOUS | Status: AC
  Filled 2013-12-07: qty 8

## 2013-12-07 MED ORDER — FAMOTIDINE IN NACL 20-0.9 MG/50ML-% IV SOLN
20.0000 mg | Freq: Once | INTRAVENOUS | Status: AC
  Administered 2013-12-07: 20 mg via INTRAVENOUS

## 2013-12-07 MED ORDER — DEXAMETHASONE SODIUM PHOSPHATE 10 MG/ML IJ SOLN
INTRAMUSCULAR | Status: AC
  Filled 2013-12-07: qty 1

## 2013-12-07 MED ORDER — SODIUM CHLORIDE 0.9 % IV SOLN
Freq: Once | INTRAVENOUS | Status: AC
  Administered 2013-12-07: 13:00:00 via INTRAVENOUS

## 2013-12-07 MED ORDER — GABAPENTIN 300 MG PO CAPS: 300.0000 mg | ORAL_CAPSULE | Freq: Three times a day (TID) | ORAL | Status: DC

## 2013-12-07 MED ORDER — DIPHENHYDRAMINE HCL 50 MG/ML IJ SOLN
INTRAMUSCULAR | Status: AC
  Filled 2013-12-07: qty 1

## 2013-12-07 MED ORDER — ONDANSETRON 8 MG/50ML IVPB (CHCC)
8.0000 mg | Freq: Once | INTRAVENOUS | Status: AC
  Administered 2013-12-07: 8 mg via INTRAVENOUS

## 2013-12-07 NOTE — Progress Notes (Signed)
Jennifer Frazier  [phone] [phone]     ID: Jennifer Frazier DOB: June 29, 1975  MR#: [id number]  VEL#:[id number]  Patient Care Team: Sharilyn Sites, MD as PCP - General (Family Medicine) Chauncey Cruel, MD as Consulting Physician (Oncology) Thea Silversmith, MD as Consulting Physician (Radiation Oncology) [name], MD as Consulting Physician (General Surgery)   CHIEF COMPLAINT: Estrogen receptor positive stage I breast cancer  CURRENT TREATMENT: receiving adjuvant chemotherapy   BREAST CANCER HISTORY: From the original intake note:  The patient had screening mammography at [facility] showing calcifications in the right breast, and was referred to the breast Center 07/06/2013 for additional views. Right diagnostic mammography confirmed a group of amorphous calcifications in the upper outer right breast measuring 1.2 cm.  Biopsy of this area 07/17/2013 showed ([id number]) ductal carcinoma in situ, high-grade, estrogen receptor 100% positive, and progesterone receptor 96% positive, both with strong staining intensity.  The patient was then referred to surgery and on 07/25/2013 underwent bilateral breast MRI. This showed a breast density category CEA. In the right breast there was an area of clumped nodular enhancement measuring 3 cm. There was also a 1.5 cm circumscribed oval mass in the medial portion of the right breast consistent with a fibroadenoma. The left breast was unremarkable, and there were no abnormal appearing lymph nodes.  On 08/08/2013 the patient underwent right lumpectomy and sentinel lymph node sampling. The pathology ([id number] was (showed, in addition to ductal carcinoma in situ, invasive ductal carcinoma, measuring 0.7 cm, grade 3, estrogen receptor 86% positive, progesterone receptor 89% positive, both with strong staining intensity, with an MIB-1 of 62%, and no HER-2 amplification. Both sentinel lymph nodes were clear.  Margins were ample.   The patient's subsequent history is as detailed below  INTERVAL HISTORY: Jennifer Frazier returns today for follow up of her breast cancer, accompanied by her mother. Today is day 1 cycle 2 of 12 planned cycles of paclitaxel. She tolerated the first round of this drug well with no complaints. Her port is working well. She had previously been afraid to sleep in her bed because she thought the port would move. She gave up on this idea last week and is happy to be back in her bed, but sleep is still an issue. She didn't like the way the trazodone made her feel so she stopped using it almost immediately. Her hot flashes are getting worse at night as well. Jennifer Frazier and her mother have also been at odds with each other for the past couple of days. Her mother suffered a stroke a few years ago, and is wheelchair bound. Jennifer Frazier says her mother is more independent than she gives herself credit for, but knows that if she leaves certain chores undone that Jennifer Frazier will eventually step in and do them. Their latest fight was because her mother had spent too much time getting dressed, causing Jennifer Frazier to be late for treatment today, and Jennifer Frazier hates to rush.  REVIEW OF SYSTEMS: Jennifer Frazier denies fevers, chills, nausea, vomiting, or change in bowel or bladder habits. She has no mouth sores, rashes, or peripheral neuropathy symptoms. She denies shortness of breath, chest pain, cough, palpitations, or excessive fatigue. Her sense of taste is still altered and she tends to crave fast food. She remains well hydrated. A detailed review of systems was otherwise noncontributory.  PAST MEDICAL HISTORY: Past Medical History  Diagnosis Date  . Fibroids     uterine fibroids  . Anemia   .  Thyroid disease   . Contact lens/glasses fitting     wears contacts or glasses  . Sleep apnea     has a cpap-does not always use it    PAST SURGICAL HISTORY: Past Surgical History  Procedure Laterality Date  . Uae  2012    urerine  ablasion  . Eye surgery  2013    retinal tear-lazer-both  . Wisdom tooth extraction    . Breast surgery      lumpectomy 7/14  . Portacath placement Right 09/29/2013    Procedure: INSERTION PORT-A-CATH WITH ULTRA SOUND;  Surgeon: Thomas Cornett, MD;  Location: [facility];  Service: General;  Laterality: Right;    FAMILY HISTORY Family History  Problem Relation Age of Onset  . Cancer Father 58    colon  . Cancer Maternal Aunt 50    breast cancer  . Cancer Paternal Aunt 75    breast cancer  . Cancer Maternal Grandmother 52    ovarian or uterine cancer  . Cancer Paternal Grandmother     unknown primary   the patient's father died at the age of 65, been diagnosed with colon cancer at the age of 59. The patient's mother is living, currently 71 years old. The patient has one brother, no sisters. One of the patient's mother's sisters was diagnosed with breast cancer at the age of 50 in one of the patient's father's mother was diagnosed with breast cancer at the age of 70. There is no history of ovarian cancer in the family.  GYNECOLOGIC HISTORY:  Patient's last menstrual period was 11/17/2013. Menarche age 8, for the patient is GX P0. She still having regular periods. 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 She is single and her mother lives with her.(The patient's mother is wheelchair-bound secondary to a stroke).    ADVANCED DIRECTIVES: Not in place; at the 08/24/2013 visit the patient was given the appropriate documents to come feel notarize associate may declare healthcare power of attorney.   HEALTH MAINTENANCE: History  Substance Use Topics  . Smoking status: Never Smoker   . Smokeless tobacco: Not on file  . Alcohol Use: No     Colonoscopy:  PAP:  Bone density:  Lipid panel:  Allergies  Allergen Reactions  . Gadolinium       Desc: NAUSEA WITH MRI CONTRAST-NO VOMITING   . Tramadol Hives and Nausea Only    Current Outpatient Prescriptions  Medication Sig Dispense Refill  . Cholecalciferol (VITAMIN D3) 3000 UNITS TABS Take 10,000 Units by mouth daily.    . ibuprofen (ADVIL,MOTRIN) 100 MG/5ML suspension Take 200 mg by mouth every 4 (four) hours as needed.    . lidocaine-prilocaine (EMLA) cream Apply 1 application topically as needed. 30 g 0  . promethazine (PHENERGAN) 25 MG tablet Take 25 mg by mouth every 6 (six) hours as needed for nausea or vomiting.    . thyroid (ARMOUR) 130 MG tablet Take 130 mg by mouth daily.    . vitamin B-12 (CYANOCOBALAMIN) 1000 MCG tablet Take 5,000 mcg by mouth daily.    . gabapentin (NEURONTIN) 300 MG capsule Take 1 capsule (300 mg total) by mouth 3 (three) times daily. 30 capsule 3  . traZODone (DESYREL) 50 MG tablet Take 1 tablet (50 mg total) by mouth at bedtime. 30 tablet 2   No current facility-administered medications for this visit.   Facility-Administered Medications Ordered   in Other Visits  Medication Dose Route Frequency Provider Last Rate Last Dose  . famotidine (PEPCID) IVPB 20 mg  20 mg Intravenous Once Chauncey Cruel, MD      . ondansetron (ZOFRAN) IVPB 8 mg  8 mg Intravenous Once Chauncey Cruel, MD   8 mg at 12/07/13 1241  . PACLitaxel (TAXOL) 180 mg in dextrose 5 % 250 mL chemo infusion (</= 49m/m2)  80 mg/m2 (Treatment Plan Actual) Intravenous Once GChauncey Cruel MD        OBJECTIVE: Middle-aged [place] woman who appears stated age F72Vitals:   12/07/13 1149  BP: 146/93  Pulse: 82  Temp: 98.2 F (36.8 C)  Resp: 18     Body mass index is 33.73 kg/(m^2).    ECOG FS:1 - Symptomatic but completely ambulatory   Skin:  warm, dry, hyperpigmentation to palms, base of nails, and along oral mucosa.  Sclerae unicteric, pupils equal and reactive Oropharynx clear and moist-- no thrush No cervical or supraclavicular adenopathy Lungs no rales or  rhonchi Heart regular rate and rhythm Abd soft, nontender, positive bowel sounds MSK no focal spinal tenderness, no upper extremity lymphedema Neuro: nonfocal, well oriented, appropriate affect Breasts: deferred  LAB RESULTS:  CMP     Component Value Date/Time   NA 140 12/07/2013 1139   NA 140 10/12/2013 1628   K 3.8 12/07/2013 1139   K 4.3 10/12/2013 1628   CL 104 10/12/2013 1628   CO2 24 12/07/2013 1139   CO2 26 10/12/2013 1628   GLUCOSE 83 12/07/2013 1139   GLUCOSE 89 10/12/2013 1628   BUN 21.4 12/07/2013 1139   BUN 15 10/12/2013 1628   CREATININE 0.8 12/07/2013 1139   CREATININE 0.74 10/12/2013 1628   CALCIUM 9.9 12/07/2013 1139   CALCIUM 9.6 10/12/2013 1628   PROT 7.1 12/07/2013 1139   PROT 6.7 10/12/2013 1628   ALBUMIN 3.9 12/07/2013 1139   ALBUMIN 3.3* 10/12/2013 1628   AST 20 12/07/2013 1139   AST 15 10/12/2013 1628   ALT 18 12/07/2013 1139   ALT 16 10/12/2013 1628   ALKPHOS 77 12/07/2013 1139   ALKPHOS 93 10/12/2013 1628   BILITOT 0.42 12/07/2013 1139   BILITOT 0.4 10/12/2013 1628   GFRNONAA >90 08/04/2013 1245   GFRAA >90 08/04/2013 1245    I No results found for: SPEP  Lab Results  Component Value Date   WBC 5.5 12/07/2013   NEUTROABS 4.6 12/07/2013   HGB 11.0* 12/07/2013   HCT 34.1* 12/07/2013   MCV 82.9 12/07/2013   PLT 293 12/07/2013      Chemistry      Component Value Date/Time   NA 140 12/07/2013 1139   NA 140 10/12/2013 1628   K 3.8 12/07/2013 1139   K 4.3 10/12/2013 1628   CL 104 10/12/2013 1628   CO2 24 12/07/2013 1139   CO2 26 10/12/2013 1628   BUN 21.4 12/07/2013 1139   BUN 15 10/12/2013 1628   CREATININE 0.8 12/07/2013 1139   CREATININE 0.74 10/12/2013 1628      Component Value Date/Time   CALCIUM 9.9 12/07/2013 1139   CALCIUM 9.6 10/12/2013 1628   ALKPHOS 77 12/07/2013 1139   ALKPHOS 93 10/12/2013 1628   AST 20 12/07/2013 1139   AST 15 10/12/2013 1628   ALT 18 12/07/2013 1139   ALT 16 10/12/2013 1628   BILITOT 0.42  12/07/2013 1139   BILITOT 0.4 10/12/2013 1628       No results found for:  LABCA2  No components found for: LABCA125  No results for input(s): INR in the last 168 hours.  Urinalysis    Component Value Date/Time   COLORURINE YELLOW 10/21/2009 1440   APPEARANCEUR CLEAR 10/21/2009 1440   LABSPEC 1.012 10/21/2009 1440   PHURINE 6.0 10/21/2009 1440   GLUCOSEU NEGATIVE 10/21/2009 1440   HGBUR NEGATIVE 10/21/2009 1440   BILIRUBINUR NEGATIVE 10/21/2009 1440   KETONESUR 15* 10/21/2009 1440   PROTEINUR NEGATIVE 10/21/2009 1440   UROBILINOGEN 0.2 10/21/2009 1440   NITRITE NEGATIVE 10/21/2009 1440   LEUKOCYTESUR  10/21/2009 1440    NEGATIVE MICROSCOPIC NOT DONE ON URINES WITH NEGATIVE PROTEIN, BLOOD, LEUKOCYTES, NITRITE, OR GLUCOSE <1000 mg/dL.    STUDIES: Ir Cv Line Injection  11/17/2013   CLINICAL DATA:  38 year old female with estrogen receptor positive stage I breast cancer now status post right lumpectomy and sentinel lymph node biopsy. A left subclavian approach port a catheter was placed for chemotherapy. The patient was seen in interventional Radiology on October 9th for port catheter injection. Evaluation under fluoroscopy demonstrated that the port catheter had flipped posteriorly. This was successfully manipulated with in the subcutaneous pocket back into the appropriate position.  Unfortunately, when the patient presented to chemotherapy this week the port a catheter again was not accessible. She presents to Interventional Radiology for repeat port evaluation.  EXAM: CENTRAL VENOUS CATHETER  Date: 11/17/2013  PROCEDURE: 1. Successful manipulation of port a catheter back into the normal position. 2. Port a catheter injection under fluoroscopy Interventional Radiologist:  Criselda Peaches, MD  ANESTHESIA/SEDATION: None.  FLUOROSCOPY TIME:  6 seconds  73 mGy  CONTRAST:  15 mL Omnipaque 300  TECHNIQUE: Informed consent was obtained from the patient following explanation of the  procedure, risks, benefits and alternatives. The patient understands, agrees and consents for the procedure. All questions were addressed. A time out was performed.  The port a catheter was palpated and felt to have flipped again and a pocket. This was successfully manipulated back and in the normal position.  The skin overlying the port catheter was then sterilely prepped using alcohol skin prep 's. A was accessed with a Huber needle. A port catheter injection was performed. This demonstrates a right IJ approach port catheter. The catheter tip lies in the upper right atrium. No evidence of thrombin sheath or thrombus. The catheter flushes and aspirates well. The poor was de accessed.  IMPRESSION: 1. Recurrent malpositioning of the port a catheter reservoir within the subcutaneous pocket. The port reservoir has again flipped posteriorly. 2. Successful manipulation of the port reservoir back into the normal position. 3. Port catheter injection demonstrates a well-functioning right IJ approach port a catheter. Surgical revision may be required for placement of new or additional retention sutures to prevent continued malpositioning of the reservoir within the reservoir pocket.  Signed,  Criselda Peaches, MD  [facility]  [facility]   Electronically Signed   By: Jacqulynn Cadet M.D.   On: 11/17/2013 16:57    ASSESSMENT: 38 y.o. BRCA negative [place] woman status post right breast biopsy 07/17/2013 for high-grade ductal carcinoma in situ, estrogen and progesterone receptor positive  (1) status post right lumpectomy and sentinel lymph node sampling 08/08/2013 for a pT1b pN0, stage IA invasive ductal carcinoma, grade 3, estrogen and progesterone receptor positive, with an MIB-1 of 62% and no HER-2 amplification  (2) genetics sent 08/02/2013 was normal but did identify a variant of uncertain significance called BRCA2, [id number].  (3) Oncotype score  of 23  predicts a risk of outside the breast recurrence within 10 years of 15% if the patient's only systemic treatment is tamoxifen for 5 years. Adjuvant chemotherapy was recommended  (4) doxorubicin and cyclophosphamide in dose dense fashion x4 with Neulasta support to start 10/05/2013, to be followed by paclitaxel weekly x12  (5) adjuvant radiation to follow chemotherapy  (6) anti-estrogens for 5-10 years to follow radiation  (7) "flipped port:" The port head in Jennifer Frazier's anterior chest appeared unusually mobile. If we are not able to access it reliably what will have to be replaced.  PLAN: Jennifer Frazier is physically doing well today. The labs were reviewed in detail and were entirely stable. She will proceed with week 2 of taxol today.   I explained to [facility] mother that though she is doing well on the outside, internally Jennifer Frazier is under a great deal of stress. Anything her mother can do to alleviate this burden would be helpful. Additionally as Lakea moves towards menopause her moods may change unexpectedly. At this time Jennifer Frazier denies depression and anxiety, but agrees she has felt a looming sense of stress since her diagnosis. They agreed to talk more about this matter at home after tensions have calmed.   Jennifer Frazier will return next week for labs, an office visit, and the start of cycle 2. She understands and agrees with this plan. She knows the goal of treatment in her case is cure. She has been encouraged to call with any issues that might arise before her next visit here.   Jennifer Duster, NP   12/07/2013 12:54 PM

## 2013-12-07 NOTE — Patient Instructions (Signed)
[facility] Discharge Instructions for Patients Receiving Chemotherapy  Today you received the following chemotherapy agents taxol  To help prevent nausea and vomiting after your treatment, we encourage you to take your nausea medication as directed   If you develop nausea and vomiting that is not controlled by your nausea medication, call the clinic.   BELOW ARE SYMPTOMS THAT SHOULD BE REPORTED IMMEDIATELY:  *FEVER GREATER THAN 100.5 F  *CHILLS WITH OR WITHOUT FEVER  NAUSEA AND VOMITING THAT IS NOT CONTROLLED WITH YOUR NAUSEA MEDICATION  *UNUSUAL SHORTNESS OF BREATH  *UNUSUAL BRUISING OR BLEEDING  TENDERNESS IN MOUTH AND THROAT WITH OR WITHOUT PRESENCE OF ULCERS  *URINARY PROBLEMS  *BOWEL PROBLEMS  UNUSUAL RASH Items with * indicate a potential emergency and should be followed up as soon as possible.  Feel free to call the clinic you have any questions or concerns. The clinic phone number is [phone].  

## 2013-12-08 ENCOUNTER — Encounter: Payer: Self-pay | Admitting: Oncology

## 2013-12-08 NOTE — Progress Notes (Signed)
Faxed clinical information to [facility] @ [phone]

## 2013-12-14 ENCOUNTER — Ambulatory Visit (HOSPITAL_BASED_OUTPATIENT_CLINIC_OR_DEPARTMENT_OTHER): Payer: 59 | Admitting: Nurse Practitioner

## 2013-12-14 ENCOUNTER — Ambulatory Visit: Payer: 59

## 2013-12-14 ENCOUNTER — Other Ambulatory Visit: Payer: Self-pay | Admitting: Oncology

## 2013-12-14 ENCOUNTER — Ambulatory Visit (INDEPENDENT_AMBULATORY_CARE_PROVIDER_SITE_OTHER): Payer: Self-pay | Admitting: Surgery

## 2013-12-14 ENCOUNTER — Encounter: Payer: Self-pay | Admitting: Nurse Practitioner

## 2013-12-14 ENCOUNTER — Other Ambulatory Visit (HOSPITAL_BASED_OUTPATIENT_CLINIC_OR_DEPARTMENT_OTHER): Payer: 59

## 2013-12-14 VITALS — BP 137/92 | HR 75 | Temp 98.3°F | Resp 18 | Ht 69.0 in | Wt 229.1 lb

## 2013-12-14 DIAGNOSIS — D0510 Intraductal carcinoma in situ of unspecified breast: Secondary | ICD-10-CM

## 2013-12-14 DIAGNOSIS — Z803 Family history of malignant neoplasm of breast: Secondary | ICD-10-CM

## 2013-12-14 DIAGNOSIS — C50411 Malignant neoplasm of upper-outer quadrant of right female breast: Secondary | ICD-10-CM

## 2013-12-14 DIAGNOSIS — Z800 Family history of malignant neoplasm of digestive organs: Secondary | ICD-10-CM

## 2013-12-14 DIAGNOSIS — G4700 Insomnia, unspecified: Secondary | ICD-10-CM

## 2013-12-14 DIAGNOSIS — Z170 Estrogen receptor positive status [ER+]: Secondary | ICD-10-CM

## 2013-12-14 LAB — CBC WITH DIFFERENTIAL/PLATELET
BASO%: 0.7 % (ref 0.0–2.0)
Basophils Absolute: 0 10*3/uL (ref 0.0–0.1)
EOS%: 0.7 % (ref 0.0–7.0)
Eosinophils Absolute: 0 10*3/uL (ref 0.0–0.5)
HCT: 33.9 % — ABNORMAL LOW (ref 34.8–46.6)
HGB: 11.1 g/dL — ABNORMAL LOW (ref 11.6–15.9)
LYMPH%: 17.9 % (ref 14.0–49.7)
MCH: 26.7 pg (ref 25.1–34.0)
MCHC: 32.7 g/dL (ref 31.5–36.0)
MCV: 81.7 fL (ref 79.5–101.0)
MONO#: 0.2 10*3/uL (ref 0.1–0.9)
MONO%: 6.3 % (ref 0.0–14.0)
NEUT%: 74.4 % (ref 38.4–76.8)
NEUTROS ABS: 2 10*3/uL (ref 1.5–6.5)
Platelets: 240 10*3/uL (ref 145–400)
RBC: 4.15 10*6/uL (ref 3.70–5.45)
RDW: 18 % — ABNORMAL HIGH (ref 11.2–14.5)
WBC: 2.7 10*3/uL — ABNORMAL LOW (ref 3.9–10.3)
lymph#: 0.5 10*3/uL — ABNORMAL LOW (ref 0.9–3.3)

## 2013-12-14 NOTE — Progress Notes (Signed)
Jennifer Frazier  [phone] [phone]     ID: Jennifer Frazier DOB: 1975-11-26  MR#: [id number]  SPQ#:[id number]  Frazier Care Team: Sharilyn Sites, MD as PCP - General (Family Medicine) Chauncey Cruel, MD as Consulting Physician (Oncology) Thea Silversmith, MD as Consulting Physician (Radiation Oncology) [name], MD as Consulting Physician (General Surgery)   CHIEF COMPLAINT: Estrogen receptor positive stage I breast cancer CURRENT TREATMENT: receiving adjuvant chemotherapy   BREAST CANCER HISTORY: From Jennifer original intake note:  Jennifer Frazier had screening mammography at [facility] showing calcifications in Jennifer right breast, and was referred to [facility] 07/06/2013 for additional views. Right diagnostic mammography confirmed a group of amorphous calcifications in Jennifer upper outer right breast measuring 1.2 cm.  Biopsy of this area 07/17/2013 showed ([id number]) ductal carcinoma in situ, high-grade, estrogen receptor 100% positive, and progesterone receptor 96% positive, both with strong staining intensity.  Jennifer Frazier was then referred to surgery and on 07/25/2013 underwent bilateral breast MRI. This showed a breast density category CEA. In Jennifer right breast there was an area of clumped nodular enhancement measuring 3 cm. There was also a 1.5 cm circumscribed oval mass in Jennifer medial portion of Jennifer right breast consistent with a fibroadenoma. Jennifer left breast was unremarkable, and there were no abnormal appearing lymph nodes.  On 08/08/2013 Jennifer Frazier underwent right lumpectomy and sentinel lymph node sampling. Jennifer pathology ([id number] was (showed, in addition to ductal carcinoma in situ, invasive ductal carcinoma, measuring 0.7 cm, grade 3, estrogen receptor 86% positive, progesterone receptor 89% positive, both with strong staining intensity, with an MIB-1 of 62%, and no HER-2 amplification. Both sentinel lymph nodes were clear.  Margins were ample.   Jennifer Frazier's subsequent history is as detailed below  INTERVAL HISTORY: Jennifer Frazier returns today for follow up of her breast cancer, accompanied by her mother. Today is day 1 cycle 3 of 12 planned cycles of paclitaxel. She tried Jennifer gabapentin for a few nights, and it helped her hot flashes but was only minimally helpful for sleep past Jennifer first night that she started Jennifer drug. Her appetite is healthy, but because of her change in taste she is rarely satisfied. She is interested in a nutrition appointment for healthy eating tips. This week she has experienced some hard stools, but is having bowel movements regularly.   REVIEW OF SYSTEMS: Jennifer Frazier denies fevers, chills, nausea, vomiting, or change in bladder habits. She has no mouth sores, rashes, or peripheral neuropathy symptoms. She denies shortness of breath, chest pain, cough, palpitations, or excessive fatigue.  A detailed review of systems was otherwise noncontributory.  PAST MEDICAL HISTORY: Past Medical History  Diagnosis Date  . Fibroids     uterine fibroids  . Anemia   . Thyroid disease   . Contact lens/glasses fitting     wears contacts or glasses  . Sleep apnea     has a cpap-does not always use it    PAST SURGICAL HISTORY: Past Surgical History  Procedure Laterality Date  . [place]  2012    urerine ablasion  . Eye surgery  2013    retinal tear-lazer-both  . Wisdom tooth extraction    . Breast surgery      lumpectomy 7/14  . Portacath placement Right 09/29/2013    Procedure: INSERTION PORT-A-CATH WITH ULTRA SOUND;  Surgeon: [name], MD;  Location: [facility];  Service: General;  Laterality: Right;    FAMILY HISTORY Family History  Problem Relation Age of Onset  . Cancer Father 46    colon  . Cancer Maternal Aunt 50    breast cancer  . Cancer Paternal Aunt 30    breast cancer  . Cancer Maternal Grandmother 77    ovarian or uterine cancer  . Cancer Paternal Grandmother      unknown primary   Jennifer Frazier's father died at Jennifer age of 38, been diagnosed with colon cancer at Jennifer age of 58. Jennifer Frazier's mother is living, currently 100 years old. Jennifer Frazier has one brother, no sisters. One of Jennifer Frazier's mother's sisters was diagnosed with breast cancer at Jennifer age of 45 in one of Jennifer Frazier's father's mother was diagnosed with breast cancer at Jennifer age of 43. There is no history of ovarian cancer in Jennifer family.  GYNECOLOGIC HISTORY:  Frazier's last menstrual period was 11/17/2013. Menarche age 59, for Jennifer Frazier is GX P0. She still having regular periods. 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 She is single and her mother lives with her.(Jennifer Frazier's mother is wheelchair-bound secondary to a stroke).    ADVANCED DIRECTIVES: Not in place; at Jennifer 08/24/2013 visit Jennifer Frazier was given Jennifer appropriate documents to come feel notarize associate may declare healthcare power of attorney.   HEALTH MAINTENANCE: History  Substance Use Topics  . Smoking status: Never Smoker   . Smokeless tobacco: Not on file  . Alcohol Use: No     Colonoscopy:  PAP:  Bone density:  Lipid panel:  Allergies  Allergen Reactions  . Gadolinium      Desc: NAUSEA WITH MRI CONTRAST-NO VOMITING   . Tramadol Hives and Nausea Only    Current Outpatient Prescriptions  Medication Sig Dispense Refill  . Cholecalciferol (VITAMIN D3) 3000 UNITS TABS Take 10,000 Units by mouth daily.    [name] ibuprofen (ADVIL,MOTRIN) 100 MG/5ML suspension Take 200 mg by mouth every 4 (four) hours as needed.    . lidocaine-prilocaine (EMLA) cream Apply 1 application topically as needed. 30 g 0  . promethazine (PHENERGAN) 25 MG tablet Take 25 mg by mouth every 6 (six) hours as needed for nausea or vomiting.    . thyroid (ARMOUR) 130 MG tablet Take 130 mg by mouth daily.    . vitamin  B-12 (CYANOCOBALAMIN) 1000 MCG tablet Take 5,000 mcg by mouth daily.    [name] gabapentin (NEURONTIN) 300 MG capsule Take 1 capsule (300 mg total) by mouth 3 (three) times daily. 30 capsule 3  . traZODone (DESYREL) 50 MG tablet Take 1 tablet (50 mg total) by mouth at bedtime. 30 tablet 2   No current facility-administered medications for this visit.    OBJECTIVE: Middle-aged [place] American woman who appears stated age 18 Vitals:   12/14/13 1220  BP: 137/92  Pulse: 75  Temp: 98.3 F (36.8 C)  Resp: 18     Body mass index is 33.82 kg/(m^2).    ECOG FS:1 - Symptomatic but completely ambulatory   Skin: warm, dry, hyperpigmentation to bilateral palms, and base of nails HEENT: sclerae anicteric, conjunctivae pink, oropharynx clear. No thrush or mucositis.  Lymph Nodes: No cervical or supraclavicular lymphadenopathy  Lungs: clear to auscultation bilaterally, no rales, wheezes, or rhonci  Heart: regular rate and rhythm  Abdomen: round, soft, non tender, positive bowel sounds  Musculoskeletal: No focal spinal tenderness, no peripheral edema  Neuro: non focal, well oriented, positive affect  Breasts: deferred  LAB RESULTS:  CMP     Component Value Date/Time   NA 140 12/07/2013 1139   NA 140 10/12/2013 1628   K 3.8 12/07/2013 1139   K 4.3 10/12/2013 1628   CL 104 10/12/2013 1628   CO2 24 12/07/2013 1139   CO2 26 10/12/2013 1628   GLUCOSE 83 12/07/2013 1139   GLUCOSE 89 10/12/2013 1628   BUN 21.4 12/07/2013 1139   BUN 15 10/12/2013 1628   CREATININE 0.8 12/07/2013 1139   CREATININE 0.74 10/12/2013 1628   CALCIUM 9.9 12/07/2013 1139   CALCIUM 9.6 10/12/2013 1628   PROT 7.1 12/07/2013 1139   PROT 6.7 10/12/2013 1628   ALBUMIN 3.9 12/07/2013 1139   ALBUMIN 3.3* 10/12/2013 1628   AST 20 12/07/2013 1139   AST 15 10/12/2013 1628   ALT 18 12/07/2013 1139   ALT 16 10/12/2013 1628   ALKPHOS 77 12/07/2013 1139   ALKPHOS 93 10/12/2013 1628   BILITOT 0.42 12/07/2013 1139   BILITOT  0.4 10/12/2013 1628   GFRNONAA >90 08/04/2013 1245   GFRAA >90 08/04/2013 1245    I No results found for: SPEP  Lab Results  Component Value Date   WBC 2.7* 12/14/2013   NEUTROABS 2.0 12/14/2013   HGB 11.1* 12/14/2013   HCT 33.9* 12/14/2013   MCV 81.7 12/14/2013   PLT 240 12/14/2013      Chemistry      Component Value Date/Time   NA 140 12/07/2013 1139   NA 140 10/12/2013 1628   K 3.8 12/07/2013 1139   K 4.3 10/12/2013 1628   CL 104 10/12/2013 1628   CO2 24 12/07/2013 1139   CO2 26 10/12/2013 1628   BUN 21.4 12/07/2013 1139   BUN 15 10/12/2013 1628   CREATININE 0.8 12/07/2013 1139   CREATININE 0.74 10/12/2013 1628      Component Value Date/Time   CALCIUM 9.9 12/07/2013 1139   CALCIUM 9.6 10/12/2013 1628   ALKPHOS 77 12/07/2013 1139   ALKPHOS 93 10/12/2013 1628   AST 20 12/07/2013 1139   AST 15 10/12/2013 1628   ALT 18 12/07/2013 1139   ALT 16 10/12/2013 1628   BILITOT 0.42 12/07/2013 1139   BILITOT 0.4 10/12/2013 1628       No results found for: LABCA2  No components found for: LABCA125  No results for input(s): INR in Jennifer last 168 hours.  Urinalysis    Component Value Date/Time   COLORURINE YELLOW 10/21/2009 1440   APPEARANCEUR CLEAR 10/21/2009 1440   LABSPEC 1.012 10/21/2009 1440   PHURINE 6.0 10/21/2009 1440   GLUCOSEU NEGATIVE 10/21/2009 1440   HGBUR NEGATIVE 10/21/2009 1440   BILIRUBINUR NEGATIVE 10/21/2009 1440   KETONESUR 15* 10/21/2009 1440   PROTEINUR NEGATIVE 10/21/2009 1440   UROBILINOGEN 0.2 10/21/2009 1440   NITRITE NEGATIVE 10/21/2009 1440   LEUKOCYTESUR  10/21/2009 1440    NEGATIVE MICROSCOPIC NOT DONE ON URINES WITH NEGATIVE PROTEIN, BLOOD, LEUKOCYTES, NITRITE, OR GLUCOSE <1000 mg/dL.    STUDIES: Ir Cv Line Injection  11/17/2013   CLINICAL DATA:  38 year old female with estrogen receptor positive stage I breast cancer now status post right lumpectomy and sentinel lymph node biopsy. A left subclavian approach port a catheter  was placed for chemotherapy. Jennifer Frazier was seen in interventional Radiology on October 9th for port catheter injection. Evaluation under fluoroscopy demonstrated that Jennifer port catheter had flipped posteriorly. This was successfully manipulated with in Jennifer subcutaneous pocket back into Jennifer appropriate position.  Unfortunately, when Jennifer  Frazier presented to chemotherapy this week Jennifer port a catheter again was not accessible. She presents to Interventional Radiology for repeat port evaluation.  EXAM: CENTRAL VENOUS CATHETER  Date: 11/17/2013  PROCEDURE: 1. Successful manipulation of port a catheter back into Jennifer normal position. 2. Port a catheter injection under fluoroscopy Interventional Radiologist:  Criselda Peaches, MD  ANESTHESIA/SEDATION: None.  FLUOROSCOPY TIME:  6 seconds  73 mGy  CONTRAST:  15 mL Omnipaque 300  TECHNIQUE: Informed consent was obtained from Jennifer Frazier following explanation of Jennifer procedure, risks, benefits and alternatives. Jennifer Frazier understands, agrees and consents for Jennifer procedure. All questions were addressed. A time out was performed.  Jennifer port a catheter was palpated and felt to have flipped again and a pocket. This was successfully manipulated back and in Jennifer normal position.  Jennifer skin overlying Jennifer port catheter was then sterilely prepped using alcohol skin prep 's. A was accessed with a Huber needle. A port catheter injection was performed. This demonstrates a right IJ approach port catheter. Jennifer catheter tip lies in Jennifer upper right atrium. No evidence of thrombin sheath or thrombus. Jennifer catheter flushes and aspirates well. Jennifer poor was de accessed.  IMPRESSION: 1. Recurrent malpositioning of Jennifer port a catheter reservoir within Jennifer subcutaneous pocket. Jennifer port reservoir has again flipped posteriorly. 2. Successful manipulation of Jennifer port reservoir back into Jennifer normal position. 3. Port catheter injection demonstrates a well-functioning right IJ approach port a catheter.  Surgical revision may be required for placement of new or additional retention sutures to prevent continued malpositioning of Jennifer reservoir within Jennifer reservoir pocket.  Signed,  Criselda Peaches, MD  [facility]  [facility]   Electronically Signed   By: Jacqulynn Cadet M.D.   On: 11/17/2013 16:57    ASSESSMENT: 38 y.o. BRCA negative [place] woman status post right breast biopsy 07/17/2013 for high-grade ductal carcinoma in situ, estrogen and progesterone receptor positive  (1) status post right lumpectomy and sentinel lymph node sampling 08/08/2013 for a pT1b pN0, stage IA invasive ductal carcinoma, grade 3, estrogen and progesterone receptor positive, with an MIB-1 of 62% and no HER-2 amplification  (2) genetics sent 08/02/2013 was normal but did identify a variant of uncertain significance called BRCA2, [id number].  (3) Oncotype score of 23 predicts a risk of outside Jennifer breast recurrence within 10 years of 15% if Jennifer Frazier's only systemic treatment is tamoxifen for 5 years. Adjuvant chemotherapy was recommended  (4) doxorubicin and cyclophosphamide in dose dense fashion x4 with Neulasta support to start 10/05/2013, to be followed by paclitaxel weekly x12  (5) adjuvant radiation to follow chemotherapy  (6) anti-estrogens for 5-10 years to follow radiation  (7) "flipped port:" Jennifer port head in Jennifer Frazier's anterior chest appeared unusually mobile. If we are not able to access it reliably what will have to be replaced.  PLAN: Jennifer Frazier is doing well today. Jennifer labs were reviewed in detail and were entirely stable. We would have proceeded with week 3 of taxol as scheduled, but Jennifer Frazier's port has flipped again and she was not comfortable with any more peripheral infusions. I have called Dr. Josetta Huddle office, and I have made her an urgent care visit for this afternoon at 3:15pm.  For her insomnia I encouraged her to try benadryl or melation QHS. She  will continue Jennifer gabapentin as needed for hot flashes. I have also placed a referral to our nutritionist at this time as well.   Bradyn will return next  week for labs, an office visit, and Jennifer restart of cycle 3. She understands and agrees with this plan. She knows Jennifer goal of treatment in her case is cure. She has been encouraged to call with any issues that might arise before her next visit here.  Jennifer Duster, NP   12/14/2013 1:19 PM

## 2013-12-14 NOTE — H&P (Signed)
Jennifer Frazier. [place] 12/14/2013 3:33 PM Location: [facility] Patient #: [id number] DOB: 1975-10-03 Single / Language: [name] / Race: Black or African American Female History of Present Illness [name] A. Nohemy Koop MD; 12/14/2013 4:25 PM) Patient words: reck port Patient returns for follow-up after breast conserving surgery for breast cancer and Port-A-Cath placement. Port-A-Cath keeps flipping over during treatments. This is been repositioned twice but continues to be a problem. The port is in good position but the reservoir flips over. He has 10 more weeks of treatment.  The patient is a 38 year old female   Other Problems [name], [place]; 12/14/2013 3:33 PM) Breast Cancer Depression High blood pressure Sleep Apnea Thyroid Disease  Past Surgical History [name], CMA; 12/14/2013 3:33 PM) Breast Biopsy Right. multiple Breast Mass; Local Excision Right. Oral Surgery  Diagnostic Studies History [name], CMA; 12/14/2013 3:33 PM) Colonoscopy never Mammogram within last year Pap Smear 1-5 years ago  Allergies [name], CMA; 12/14/2013 3:35 PM) TraMADol HCl *ANALGESICS - OPIOID*  Medication History ([name], CMA; 12/14/2013 3:34 PM) LORazepam (0.5MG  Tablet, Oral) Active. Gabapentin (300MG  Capsule, Oral) Active.  Social History ([place]; 12/14/2013 3:33 PM) Caffeine use Carbonated beverages. No alcohol use No drug use Tobacco use Never smoker.  Family History [name], [place]; 12/14/2013 3:33 PM) Arthritis Mother. Bleeding disorder Mother. Breast Cancer Family Members In General. Cancer Family Members In General. Cerebrovascular Accident Mother. Colon Cancer Father. Hypertension Mother.  Pregnancy / Birth History [name], [place]; 12/14/2013 3:33 PM) Age at menarche 72 years. Contraceptive History Oral contraceptives. Gravida 0 Irregular periods Para 0     Review of Systems ([place];  12/14/2013 3:33 PM) General Present- Fatigue, Night Sweats and Weight Gain. Not Present- Appetite Loss, Chills, Fever and Weight Loss. Skin Not Present- Change in Wart/Mole, Dryness, Hives, Jaundice, New Lesions, Non-Healing Wounds, Rash and Ulcer. HEENT Not Present- Earache, Hearing Loss, Hoarseness, Nose Bleed, Oral Ulcers, Ringing in the Ears, Seasonal Allergies, Sinus Pain, Sore Throat, Visual Disturbances, Wears glasses/contact lenses and Yellow Eyes. Respiratory Not Present- Bloody sputum, Chronic Cough, Difficulty Breathing, Snoring and Wheezing. Breast Present- Breast Pain. Not Present- Breast Mass, Nipple Discharge and Skin Changes. Cardiovascular Not Present- Chest Pain, Difficulty Breathing Lying Down, Leg Cramps, Palpitations, Rapid Heart Rate, Shortness of Breath and Swelling of Extremities. Gastrointestinal Not Present- Abdominal Pain, Bloating, Bloody Stool, Change in Bowel Habits, Chronic diarrhea, Constipation, Difficulty Swallowing, Excessive gas, Gets full quickly at meals, Hemorrhoids, Indigestion, Nausea, Rectal Pain and Vomiting. Female Genitourinary Present- Frequency. Not Present- Nocturia, Painful Urination, Pelvic Pain and Urgency. Musculoskeletal Not Present- Back Pain, Joint Pain, Joint Stiffness, Muscle Pain, Muscle Weakness and Swelling of Extremities. Neurological Not Present- Decreased Memory, Fainting, Headaches, Numbness, Seizures, Tingling, Tremor, Trouble walking and Weakness. Psychiatric Present- Change in Sleep Pattern. Not Present- Anxiety, Bipolar, Depression, Fearful and Frequent crying. Endocrine Present- Hot flashes. Not Present- Cold Intolerance, Excessive Hunger, Hair Changes, Heat Intolerance and New Diabetes. Hematology Not Present- Easy Bruising, Excessive bleeding, Gland problems, HIV and Persistent Infections.  Vitals ([name] CMA; 12/14/2013 3:36 PM) 12/14/2013 3:35 PM Weight: 230 lb Height: 69in Body Surface Area: 2.25 m Body Mass  Index: 33.96 kg/m Temp.: 40F(Temporal)  Pulse: 79 (Regular)  BP: 130/70 (Sitting, Left Arm, Standard)     Physical Exam (Calin Fantroy A. Loryn Haacke MD; 12/14/2013 4:26 PM)  General Mental Status-Alert. General Appearance-Consistent with stated age. Hydration-Well hydrated. Voice-Normal.  Head and Neck Head-normocephalic, atraumatic with no lesions or palpable masses. Trachea-midline. Thyroid Gland Characteristics -  normal size and consistency.  Chest and Lung Exam Note: Port site right chest intact. No signs of infection. Mild tenderness along the incision.   Musculoskeletal Normal Exam - Left-Upper Extremity Strength Normal and Lower Extremity Strength Normal. Normal Exam - Right-Upper Extremity Strength Normal and Lower Extremity Strength Normal.    Assessment & Plan (Delphine Sizemore A. Cadan Maggart MD; 12/14/2013 4:26 PM)  PORT-A-CATH IN PLACE (V45.89  Y64.158) Impression: pt port site keeps flipping over and sutures have broke holding it in place. Will need trip to OR to reposition port and secure to chest wall. risk of bleeding, infection, port migration, need for other surgeries discussed. She agrees to proceed.  Current Plans Pt Education - instructions : discussed with patient and provided information.

## 2013-12-14 NOTE — Progress Notes (Unsigned)
Asked to assess pt's port-a-cath as it appears to be flipped.  Pt. was by seen by Genelle Gather. NP today. Attempted to access port-a-cath and needle was hitting metal.  Confirmed port was flipped.  Pt. Was worried about missing tmt today.  Pt. had experienced peripheral IV chemo but did not have good experience in the past. Previous IV site to rt. arm dark in colour and hard to touch.  Reassured pt. that for various reasons, some patients do miss one week of tmt. Pt. already has appt. with Dr. Malachi Paradise this afternoon to assess port.  Above relayed to [facility].  Reassurance from [facility] to pt. about missing tmt also. Keep next Friday's tmt appt. Pt.  Agreeable to plan. HL

## 2013-12-15 ENCOUNTER — Encounter (HOSPITAL_BASED_OUTPATIENT_CLINIC_OR_DEPARTMENT_OTHER): Payer: Self-pay | Admitting: *Deleted

## 2013-12-15 NOTE — Progress Notes (Signed)
Had labs 12/14/13-ekg 10/15

## 2013-12-19 ENCOUNTER — Ambulatory Visit (HOSPITAL_BASED_OUTPATIENT_CLINIC_OR_DEPARTMENT_OTHER)
Admission: RE | Admit: 2013-12-19 | Discharge: 2013-12-19 | Disposition: A | Discharge status: Home / Self Care | Payer: 59 | Source: Ambulatory Visit | Attending: Surgery | Admitting: Surgery

## 2013-12-19 ENCOUNTER — Ambulatory Visit (HOSPITAL_BASED_OUTPATIENT_CLINIC_OR_DEPARTMENT_OTHER): Payer: 59 | Admitting: Anesthesiology

## 2013-12-19 ENCOUNTER — Encounter (HOSPITAL_BASED_OUTPATIENT_CLINIC_OR_DEPARTMENT_OTHER): Payer: Self-pay | Admitting: Anesthesiology

## 2013-12-19 ENCOUNTER — Encounter (HOSPITAL_BASED_OUTPATIENT_CLINIC_OR_DEPARTMENT_OTHER)
Admission: RE | Disposition: A | Discharge status: Home / Self Care | Payer: Self-pay | Source: Ambulatory Visit | Attending: Surgery

## 2013-12-19 DIAGNOSIS — G4730 Sleep apnea, unspecified: Secondary | ICD-10-CM | POA: Diagnosis not present

## 2013-12-19 DIAGNOSIS — Y838 Other surgical procedures as the cause of abnormal reaction of the patient, or of later complication, without mention of misadventure at the time of the procedure: Secondary | ICD-10-CM | POA: Diagnosis not present

## 2013-12-19 DIAGNOSIS — C50919 Malignant neoplasm of unspecified site of unspecified female breast: Secondary | ICD-10-CM | POA: Diagnosis not present

## 2013-12-19 DIAGNOSIS — I10 Essential (primary) hypertension: Secondary | ICD-10-CM | POA: Diagnosis not present

## 2013-12-19 DIAGNOSIS — T82528A Displacement of other cardiac and vascular devices and implants, initial encounter: Secondary | ICD-10-CM | POA: Diagnosis present

## 2013-12-19 DIAGNOSIS — Y9289 Other specified places as the place of occurrence of the external cause: Secondary | ICD-10-CM | POA: Insufficient documentation

## 2013-12-19 DIAGNOSIS — F329 Major depressive disorder, single episode, unspecified: Secondary | ICD-10-CM | POA: Diagnosis not present

## 2013-12-19 HISTORY — PX: PORT A CATH REVISION: SHX6033

## 2013-12-19 SURGERY — REVISION, PORT-A-CATH INSERTION
Anesthesia: General | Site: Chest

## 2013-12-19 MED ORDER — HEPARIN (PORCINE) IN NACL 2-0.9 UNIT/ML-% IJ SOLN
INTRAMUSCULAR | Status: AC
  Filled 2013-12-19: qty 500

## 2013-12-19 MED ORDER — OXYCODONE HCL 5 MG/5ML PO SOLN: 5.0000 mg | Freq: Once | ORAL | Status: DC | PRN

## 2013-12-19 MED ORDER — CEFAZOLIN SODIUM-DEXTROSE 2-3 GM-% IV SOLR
INTRAVENOUS | Status: DC | PRN
  Administered 2013-12-19: 2 g via INTRAVENOUS

## 2013-12-19 MED ORDER — MIDAZOLAM HCL 5 MG/5ML IJ SOLN
INTRAMUSCULAR | Status: DC | PRN
  Administered 2013-12-19: 2 mg via INTRAVENOUS

## 2013-12-19 MED ORDER — FENTANYL CITRATE 0.05 MG/ML IJ SOLN
INTRAMUSCULAR | Status: DC | PRN
  Administered 2013-12-19 (×2): 50 ug via INTRAVENOUS

## 2013-12-19 MED ORDER — SCOPOLAMINE 1 MG/3DAYS TD PT72
MEDICATED_PATCH | TRANSDERMAL | Status: AC
  Filled 2013-12-19: qty 1

## 2013-12-19 MED ORDER — LACTATED RINGERS IV SOLN
INTRAVENOUS | Status: DC
  Administered 2013-12-19: 13:00:00 via INTRAVENOUS

## 2013-12-19 MED ORDER — PROPOFOL 10 MG/ML IV BOLUS
INTRAVENOUS | Status: DC | PRN
  Administered 2013-12-19: 200 mg via INTRAVENOUS

## 2013-12-19 MED ORDER — ONDANSETRON HCL 4 MG/2ML IJ SOLN
INTRAMUSCULAR | Status: DC | PRN
  Administered 2013-12-19: 4 mg via INTRAVENOUS

## 2013-12-19 MED ORDER — HEPARIN SOD (PORK) LOCK FLUSH 100 UNIT/ML IV SOLN
INTRAVENOUS | Status: AC
  Filled 2013-12-19: qty 5

## 2013-12-19 MED ORDER — SCOPOLAMINE 1 MG/3DAYS TD PT72
1.0000 | MEDICATED_PATCH | TRANSDERMAL | Status: DC
  Administered 2013-12-19: 1.5 mg via TRANSDERMAL

## 2013-12-19 MED ORDER — BUPIVACAINE-EPINEPHRINE (PF) 0.25% -1:200000 IJ SOLN
INTRAMUSCULAR | Status: AC
  Filled 2013-12-19: qty 30

## 2013-12-19 MED ORDER — OXYCODONE HCL 5 MG PO TABS: 5.0000 mg | ORAL_TABLET | Freq: Once | ORAL | Status: DC | PRN

## 2013-12-19 MED ORDER — HYDROMORPHONE HCL 1 MG/ML IJ SOLN: 0.2500 mg | INTRAMUSCULAR | Status: DC | PRN

## 2013-12-19 MED ORDER — BUPIVACAINE-EPINEPHRINE 0.25% -1:200000 IJ SOLN
INTRAMUSCULAR | Status: DC | PRN
  Administered 2013-12-19: 8 mL

## 2013-12-19 MED ORDER — ONDANSETRON HCL 4 MG/2ML IJ SOLN: 4.0000 mg | Freq: Once | INTRAMUSCULAR | Status: DC | PRN

## 2013-12-19 MED ORDER — CEFAZOLIN SODIUM-DEXTROSE 2-3 GM-% IV SOLR
2.0000 g | INTRAVENOUS | Status: DC
Start: 2013-12-20 — End: 2013-12-19

## 2013-12-19 MED ORDER — MIDAZOLAM HCL 2 MG/2ML IJ SOLN
INTRAMUSCULAR | Status: AC
  Filled 2013-12-19: qty 2

## 2013-12-19 MED ORDER — FENTANYL CITRATE 0.05 MG/ML IJ SOLN: 50.0000 ug | INTRAMUSCULAR | Status: DC | PRN

## 2013-12-19 MED ORDER — CEPHALEXIN 500 MG PO CAPS: 500.0000 mg | ORAL_CAPSULE | Freq: Four times a day (QID) | ORAL | Status: DC

## 2013-12-19 MED ORDER — HYDROCODONE-ACETAMINOPHEN 5-325 MG PO TABS: 1.0000 | ORAL_TABLET | Freq: Four times a day (QID) | ORAL | Status: DC | PRN

## 2013-12-19 MED ORDER — FENTANYL CITRATE 0.05 MG/ML IJ SOLN
INTRAMUSCULAR | Status: AC
  Filled 2013-12-19: qty 6

## 2013-12-19 MED ORDER — LIDOCAINE HCL (CARDIAC) 20 MG/ML IV SOLN
INTRAVENOUS | Status: DC | PRN
  Administered 2013-12-19 (×2): 30 mg via INTRAVENOUS

## 2013-12-19 MED ORDER — CEFAZOLIN SODIUM-DEXTROSE 2-3 GM-% IV SOLR
INTRAVENOUS | Status: AC
  Filled 2013-12-19: qty 50

## 2013-12-19 MED ORDER — CHLORHEXIDINE GLUCONATE 4 % EX LIQD: 1.0000 "application " | Freq: Once | CUTANEOUS | Status: DC

## 2013-12-19 MED ORDER — MIDAZOLAM HCL 2 MG/2ML IJ SOLN: 1.0000 mg | INTRAMUSCULAR | Status: DC | PRN

## 2013-12-19 SURGICAL SUPPLY — 56 items
APL SKNCLS STERI-STRIP NONHPOA (GAUZE/BANDAGES/DRESSINGS)
BAG DECANTER FOR FLEXI CONT (MISCELLANEOUS) IMPLANT
BENZOIN TINCTURE PRP APPL 2/3 (GAUZE/BANDAGES/DRESSINGS) IMPLANT
BLADE HEX COATED 2.75 (ELECTRODE) ×3 IMPLANT
BLADE SURG 11 STRL SS (BLADE) IMPLANT
BLADE SURG 15 STRL LF DISP TIS (BLADE) ×2 IMPLANT
BLADE SURG 15 STRL SS (BLADE) ×3
CANISTER SUCT 1200ML W/VALVE (MISCELLANEOUS) IMPLANT
CHLORAPREP W/TINT 26ML (MISCELLANEOUS) ×3 IMPLANT
COVER BACK TABLE 60X90IN (DRAPES) ×3 IMPLANT
COVER MAYO STAND STRL (DRAPES) ×3 IMPLANT
COVER PROBE 5X48 (MISCELLANEOUS)
DECANTER SPIKE VIAL GLASS SM (MISCELLANEOUS) IMPLANT
DRAPE C-ARM 42X72 X-RAY (DRAPES) IMPLANT
DRAPE LAPAROSCOPIC ABDOMINAL (DRAPES) IMPLANT
DRAPE UTILITY XL STRL (DRAPES) ×3 IMPLANT
DRSG TEGADERM 2-3/8X2-3/4 SM (GAUZE/BANDAGES/DRESSINGS) IMPLANT
ELECT REM PT RETURN 9FT ADLT (ELECTROSURGICAL) ×3
ELECTRODE REM PT RTRN 9FT ADLT (ELECTROSURGICAL) ×2 IMPLANT
GEL ULTRASOUND 8.5O AQUASONIC (MISCELLANEOUS) IMPLANT
GLOVE BIOGEL PI IND STRL 8 (GLOVE) ×2 IMPLANT
GLOVE BIOGEL PI INDICATOR 8 (GLOVE) ×1
GLOVE ECLIPSE 8.0 STRL XLNG CF (GLOVE) ×3 IMPLANT
GLOVE SURG SS PI 7.0 STRL IVOR (GLOVE) ×6 IMPLANT
GOWN STRL REUS W/ TWL LRG LVL3 (GOWN DISPOSABLE) ×4 IMPLANT
GOWN STRL REUS W/TWL LRG LVL3 (GOWN DISPOSABLE) ×6
IV KIT MINILOC 20X1 SAFETY (NEEDLE) IMPLANT
KIT CVR 48X5XPRB PLUP LF (MISCELLANEOUS) IMPLANT
KIT PORT POWER 8FR ISP CVUE (Catheter) IMPLANT
LIQUID BAND (GAUZE/BANDAGES/DRESSINGS) ×3 IMPLANT
NDL SAFETY ECLIPSE 18X1.5 (NEEDLE) IMPLANT
NEEDLE HYPO 18GX1.5 SHARP (NEEDLE)
NEEDLE HYPO 22GX1.5 SAFETY (NEEDLE) IMPLANT
NEEDLE HYPO 25X1 1.5 SAFETY (NEEDLE) ×3 IMPLANT
NEEDLE SPNL 22GX3.5 QUINCKE BK (NEEDLE) IMPLANT
PACK BASIN DAY SURGERY FS (CUSTOM PROCEDURE TRAY) ×3 IMPLANT
PENCIL BUTTON HOLSTER BLD 10FT (ELECTRODE) ×3 IMPLANT
SET SHEATH INTRODUCER 10FR (MISCELLANEOUS) IMPLANT
SHEATH COOK PEEL AWAY SET 9F (SHEATH) IMPLANT
SLEEVE SCD COMPRESS KNEE MED (MISCELLANEOUS) IMPLANT
SPONGE GAUZE 4X4 12PLY STER LF (GAUZE/BANDAGES/DRESSINGS) IMPLANT
SPONGE LAP 4X18 X RAY DECT (DISPOSABLE) IMPLANT
STRIP CLOSURE SKIN 1/2X4 (GAUZE/BANDAGES/DRESSINGS) IMPLANT
SUT MON AB 4-0 PC3 18 (SUTURE) ×3 IMPLANT
SUT PROLENE 2 0 CT2 30 (SUTURE) IMPLANT
SUT PROLENE 2 0 SH DA (SUTURE) ×3 IMPLANT
SUT SILK 2 0 TIES 17X18 (SUTURE)
SUT SILK 2-0 18XBRD TIE BLK (SUTURE) IMPLANT
SUT VIC AB 3-0 SH 27 (SUTURE) ×3
SUT VIC AB 3-0 SH 27X BRD (SUTURE) ×2 IMPLANT
SYR 5ML LUER SLIP (SYRINGE) IMPLANT
SYR CONTROL 10ML LL (SYRINGE) ×3 IMPLANT
TOWEL OR 17X24 6PK STRL BLUE (TOWEL DISPOSABLE) ×3 IMPLANT
TOWEL OR NON WOVEN STRL DISP B (DISPOSABLE) IMPLANT
TUBE CONNECTING 20X1/4 (TUBING) IMPLANT
YANKAUER SUCT BULB TIP NO VENT (SUCTIONS) IMPLANT

## 2013-12-19 NOTE — Transfer of Care (Signed)
Immediate Anesthesia Transfer of Care Note  Patient: Jennifer Frazier  Procedure(s) Performed: Procedure(s): REPOSITION PORT (N/A)  Patient Location: PACU  Anesthesia Type:General  Level of Consciousness: awake, oriented and patient cooperative  Airway & Oxygen Therapy: Patient Spontanous Breathing and Patient connected to face mask oxygen  Post-op Assessment: Report given to PACU RN and Post -op Vital signs reviewed and stable  Post vital signs: Reviewed and stable  Complications: No apparent anesthesia complications

## 2013-12-19 NOTE — H&P (View-Only) (Signed)
Jennifer Frazier. [place] 12/14/2013 3:33 PM Location: [facility] Patient #: [id number] DOB: 1976/01/19 Single / Language: Jennifer Frazier / Race: Black or African American Female History of Present Illness [name] A. Lititia Sen MD; 12/14/2013 4:25 PM) Patient words: reck port Patient returns for follow-up after breast conserving surgery for breast cancer and Port-A-Cath placement. Port-A-Cath keeps flipping over during treatments. This is been repositioned twice but continues to be a problem. The port is in good position but the reservoir flips over. He has 10 more weeks of treatment.  The patient is a 38 year old female   Other Problems Jennifer Frazier, [place]; 12/14/2013 3:33 PM) Breast Cancer Depression High blood pressure Sleep Apnea Thyroid Disease  Past Surgical History Jennifer Frazier, CMA; 12/14/2013 3:33 PM) Breast Biopsy Right. multiple Breast Mass; Local Excision Right. Oral Surgery  Diagnostic Studies History Jennifer Frazier, CMA; 12/14/2013 3:33 PM) Colonoscopy never Mammogram within last year Pap Smear 1-5 years ago  Allergies Jennifer Frazier, CMA; 12/14/2013 3:35 PM) TraMADol HCl *ANALGESICS - OPIOID*  Medication History ([name], CMA; 12/14/2013 3:34 PM) LORazepam (0.5MG  Tablet, Oral) Active. Gabapentin (300MG  Capsule, Oral) Active.  Social History ([place]; 12/14/2013 3:33 PM) Caffeine use Carbonated beverages. No alcohol use No drug use Tobacco use Never smoker.  Family History Jennifer Frazier, [place]; 12/14/2013 3:33 PM) Arthritis Mother. Bleeding disorder Mother. Breast Cancer Family Members In General. Cancer Family Members In General. Cerebrovascular Accident Mother. Colon Cancer Father. Hypertension Mother.  Pregnancy / Birth History Jennifer Frazier, [place]; 12/14/2013 3:33 PM) Age at menarche 43 years. Contraceptive History Oral contraceptives. Gravida 0 Irregular periods Para 0     Review of Systems ([place];  12/14/2013 3:33 PM) General Present- Fatigue, Night Sweats and Weight Gain. Not Present- Appetite Loss, Chills, Fever and Weight Loss. Skin Not Present- Change in Wart/Mole, Dryness, Hives, Jaundice, New Lesions, Non-Healing Wounds, Rash and Ulcer. HEENT Not Present- Earache, Hearing Loss, Hoarseness, Nose Bleed, Oral Ulcers, Ringing in the Ears, Seasonal Allergies, Sinus Pain, Sore Throat, Visual Disturbances, Wears glasses/contact lenses and Yellow Eyes. Respiratory Not Present- Bloody sputum, Chronic Cough, Difficulty Breathing, Snoring and Wheezing. Breast Present- Breast Pain. Not Present- Breast Mass, Nipple Discharge and Skin Changes. Cardiovascular Not Present- Chest Pain, Difficulty Breathing Lying Down, Leg Cramps, Palpitations, Rapid Heart Rate, Shortness of Breath and Swelling of Extremities. Gastrointestinal Not Present- Abdominal Pain, Bloating, Bloody Stool, Change in Bowel Habits, Chronic diarrhea, Constipation, Difficulty Swallowing, Excessive gas, Gets full quickly at meals, Hemorrhoids, Indigestion, Nausea, Rectal Pain and Vomiting. Female Genitourinary Present- Frequency. Not Present- Nocturia, Painful Urination, Pelvic Pain and Urgency. Musculoskeletal Not Present- Back Pain, Joint Pain, Joint Stiffness, Muscle Pain, Muscle Weakness and Swelling of Extremities. Neurological Not Present- Decreased Memory, Fainting, Headaches, Numbness, Seizures, Tingling, Tremor, Trouble walking and Weakness. Psychiatric Present- Change in Sleep Pattern. Not Present- Anxiety, Bipolar, Depression, Fearful and Frequent crying. Endocrine Present- Hot flashes. Not Present- Cold Intolerance, Excessive Hunger, Hair Changes, Heat Intolerance and New Diabetes. Hematology Not Present- Easy Bruising, Excessive bleeding, Gland problems, HIV and Persistent Infections.  Vitals ([name] CMA; 12/14/2013 3:36 PM) 12/14/2013 3:35 PM Weight: 230 lb Height: 69in Body Surface Area: 2.25 m Body Mass  Index: 33.96 kg/m Temp.: 70F(Temporal)  Pulse: 79 (Regular)  BP: 130/70 (Sitting, Left Arm, Standard)     Physical Exam ([name] A. Amberlynn Tempesta MD; 12/14/2013 4:26 PM)  General Mental Status-Alert. General Appearance-Consistent with stated age. Hydration-Well hydrated. Voice-Normal.  Head and Neck Head-normocephalic, atraumatic with no lesions or palpable masses. Trachea-midline. Thyroid Gland Characteristics -  normal size and consistency.  Chest and Lung Exam Note: Port site right chest intact. No signs of infection. Mild tenderness along the incision.   Musculoskeletal Normal Exam - Left-Upper Extremity Strength Normal and Lower Extremity Strength Normal. Normal Exam - Right-Upper Extremity Strength Normal and Lower Extremity Strength Normal.    Assessment & Plan ([name] A. Vaden Becherer MD; 12/14/2013 4:26 PM)  PORT-A-CATH IN PLACE (V45.89  G53.646) Impression: pt port site keeps flipping over and sutures have broke holding it in place. Will need trip to OR to reposition port and secure to chest wall. risk of bleeding, infection, port migration, need for other surgeries discussed. She agrees to proceed.  Current Plans Pt Education - instructions : discussed with patient and provided information.

## 2013-12-19 NOTE — Anesthesia Preprocedure Evaluation (Signed)
Anesthesia Evaluation  Patient identified by MRN, date of birth, ID band Patient awake    Reviewed: Allergy & Precautions, H&P , NPO status , Patient's Chart, lab work & pertinent test results  Airway Mallampati: I TM Distance: >3 FB Neck ROM: Full    Dental  (+) Teeth Intact, Dental Advisory Given   Pulmonary  breath sounds clear to auscultation        Cardiovascular Rhythm:Regular Rate:Normal     Neuro/Psych    GI/Hepatic   Endo/Other    Renal/GU      Musculoskeletal   Abdominal   Peds  Hematology   Anesthesia Other Findings   Reproductive/Obstetrics                           Anesthesia Physical Anesthesia Plan  ASA: II  Anesthesia Plan: General   Post-op Pain Management:    Induction: Intravenous  Airway Management Planned: LMA  Additional Equipment:   Intra-op Plan:   Post-operative Plan: Extubation in OR  Informed Consent: I have reviewed the patients History and Physical, chart, labs and discussed the procedure including the risks, benefits and alternatives for the proposed anesthesia with the patient or authorized representative who has indicated his/her understanding and acceptance.   Dental advisory given  Plan Discussed with: CRNA, Anesthesiologist and Surgeon  Anesthesia Plan Comments:         Anesthesia Quick Evaluation  

## 2013-12-19 NOTE — Brief Op Note (Signed)
12/19/2013  1:53 PM  PATIENT:  Jennifer Frazier  38 y.o. female  PRE-OPERATIVE DIAGNOSIS:  Port malposition  POST-OPERATIVE DIAGNOSIS:  same  PROCEDURE:  Procedure(s): REPOSITION PORT (N/A)  SURGEON:  Surgeon(s) and Role:    * [name], MD - Primary    ASSISTANTS: none   ANESTHESIA:   local and general  EBL:  Total I/O In: 500 [I.V.:500] Out: -   BLOOD ADMINISTERED:none  DRAINS: none   LOCAL MEDICATIONS USED:  BUPIVICAINE   SPECIMEN:  No Specimen  DISPOSITION OF SPECIMEN:  N/A  COUNTS:  YES  TOURNIQUET:  * No tourniquets in log *  DICTATION: .Other Dictation: Dictation Number [id number]  PLAN OF CARE: Discharge to home after PACU  PATIENT DISPOSITION:  PACU - hemodynamically stable.   Delay start of Pharmacological VTE agent (>24hrs) due to surgical blood loss or risk of bleeding: not applicable

## 2013-12-19 NOTE — Anesthesia Postprocedure Evaluation (Signed)
  Anesthesia Post-op Note  Patient: Jennifer Frazier  Procedure(s) Performed: Procedure(s): REPOSITION PORT (N/A)  Patient Location: PACU  Anesthesia Type: General   Level of Consciousness: awake, alert  and oriented  Airway and Oxygen Therapy: Patient Spontanous Breathing  Post-op Pain: none  Post-op Assessment: Post-op Vital signs reviewed  Post-op Vital Signs: Reviewed  Last Vitals:  Filed Vitals:   12/19/13 1437  BP: 135/91  Pulse: 72  Temp: 36.6 C  Resp: 16    Complications: No apparent anesthesia complications

## 2013-12-19 NOTE — Anesthesia Procedure Notes (Signed)
Procedure Name: LMA Insertion Date/Time: 12/19/2013 1:18 PM Performed by: Toula Moos L Pre-anesthesia Checklist: Patient identified, Emergency Drugs available, Suction available, Patient being monitored and Timeout performed Patient Re-evaluated:Patient Re-evaluated prior to inductionOxygen Delivery Method: Circle System Utilized Preoxygenation: Pre-oxygenation with 100% oxygen Intubation Type: IV induction Ventilation: Mask ventilation without difficulty LMA: LMA inserted LMA Size: 4.0 Number of attempts: 1 Airway Equipment and Method: bite block Placement Confirmation: positive ETCO2 Tube secured with: Tape Dental Injury: Teeth and Oropharynx as per pre-operative assessment

## 2013-12-19 NOTE — Op Note (Unsigned)
Jennifer Frazier, Jennifer Frazier                ACCOUNT NO.:  [id number]  MEDICAL RECORD NO.:  [id number]  LOCATION:                                 FACILITY:  PHYSICIAN:  Harrel Ferrone A. Sean Malinowski, M.D.DATE OF BIRTH:  1976/01/09  DATE OF PROCEDURE:  12/19/2013 DATE OF DISCHARGE:                              OPERATIVE REPORT   PREOPERATIVE DIAGNOSIS:  History of Port-A-Cath placement with malpositioned reservoir of port.  POSTOPERATIVE DIAGNOSIS:  History of Port-A-Cath placement with malpositioned reservoir of port.  PROCEDURE:  Reposition and re-securing of malpositioned port reservoir.  SURGEON:  [name] A. Zakiyyah Savannah, M.D.  ANESTHESIA:  LMA with 0.25% Sensorcaine.  EBL:  Minimal.  SPECIMENS:  None.  INDICATIONS FOR PROCEDURE:  The patient is a 38 year old female, who has been treated for breast cancer and has a Port-A-Cath in place.  The reservoir keeps slipping over and has been positioned a couple times by interventional radiologist.  It looked like her sutures broke.  She presents today to have her port re-secured to the chest wall such that she can continue having chemotherapy.  Risk of bleeding, infection, migration, having to remove the entire port as well as to replace, embolus, and the potential for this to happen again were all discussed with her.  She agreed to proceed.  DESCRIPTION OF PROCEDURE:  The patient was met in the holding area. Questions answered.  She was taken back to the operating room and placed supine on the OR table.  After induction of LMA anesthesia, the right upper chest region was prepped and draped in sterile fashion.  Incision was injected with 0.25% Sensorcaine.  Time-out was done.  Incision was opened and dissection was carried down and the actual port reservoir was identified and had flipped over.  I went ahead and flipped it back over and the sutures that had held it in place had broken.  These were removed, and I placed 2 sutures to the port of 2-0  Prolene and secured it back down to the chest wall.  There were no signs of any infection. We then made sure the port not to flip over again by manipulating the reservoir and it stayed against the chest wall quite nicely.  The incision was then closed with 3-0 Vicryl and 4-0 Monocryl.  Dermabond applied.  All final counts of sponge, needle, and instruments found to be correct for this portion of the case.  The patient was then awoke, extubated, taken to recovery in satisfactory condition.     Zamara Cozad A. Kinsie Belford, M.D.     TAC/MEDQ  D:  12/19/2013  T:  12/19/2013  Job:  [id number]

## 2013-12-19 NOTE — Interval H&P Note (Signed)
History and Physical Interval Note:  12/19/2013 12:51 PM  Ernst Breach  has presented today for surgery, with the diagnosis of Port Placement   The various methods of treatment have been discussed with the patient and family. After consideration of risks, benefits and other options for treatment, the patient has consented to  Procedure(s): REPOSITION PORT (N/A) as a surgical intervention .  The patient's history has been reviewed, patient examined, no change in status, stable for surgery.  I have reviewed the patient's chart and labs.  Questions were answered to the patient's satisfaction.     Jayro Mcmath A.

## 2013-12-19 NOTE — Discharge Instructions (Signed)
° ° °PORT-A-CATH: POST OP INSTRUCTIONS ° °Always review your discharge instruction sheet given to you by the facility where your surgery was performed.  ° °1. A prescription for pain medication may be given to you upon discharge. Take your pain medication as prescribed, if needed. If narcotic pain medicine is not needed, then you make take acetaminophen (Tylenol) or ibuprofen (Advil) as needed.  °2. Take your usually prescribed medications unless otherwise directed. °3. If you need a refill on your pain medication, please contact our office. All narcotic pain medicine now requires a paper prescription.  Phoned in and fax refills are no longer allowed by law.  Prescriptions will not be filled after 5 pm or on weekends.  °4. You should follow a light diet for the remainder of the day after your procedure. °5. Most patients will experience some mild swelling and/or bruising in the area of the incision. It may take several days to resolve. °6. It is common to experience some constipation if taking pain medication after surgery. Increasing fluid intake and taking a stool softener (such as Colace) will usually help or prevent this problem from occurring. A mild laxative (Milk of Magnesia or Miralax) should be taken according to package directions if there are no bowel movements after 48 hours.  °7. Unless discharge instructions indicate otherwise, you may remove your bandages 48 hours after surgery, and you may shower at that time. You may have steri-strips (small white skin tapes) in place directly over the incision.  These strips should be left on the skin for 7-10 days.  If your surgeon used Dermabond (skin glue) on the incision, you may shower in 24 hours.  The glue will flake off over the next 2-3 weeks.  °8. If your port is left accessed at the end of surgery (needle left in port), the dressing cannot get wet and should only by changed by a healthcare professional. When the port is no longer accessed (when the  needle has been removed), follow step 7.   °9. ACTIVITIES:  Limit activity involving your arms for the next 72 hours. Do no strenuous exercise or activity for 1 week. You may drive when you are no longer taking prescription pain medication, you can comfortably wear a seatbelt, and you can maneuver your car. °10.You may need to see your doctor in the office for a follow-up appointment.  Please °      check with your doctor.  °11.When you receive a new Port-a-Cath, you will get a product guide and  °      ID card.  Please keep them in case you need them. ° °WHEN TO CALL YOUR DOCTOR ([phone]): °1. Fever over 101.0 °2. Chills °3. Continued bleeding from incision °4. Increased redness and tenderness at the site °5. Shortness of breath, difficulty breathing ° ° °The clinic staff is available to answer your questions during regular business hours. Please don’t hesitate to call and ask to speak to one of the nurses or medical assistants for clinical concerns. If you have a medical emergency, go to the nearest emergency room or call 911.  A surgeon from [facility] is always on call at the hospital.  ° ° ° °For further information, please visit [email or web address] ° ° °Post Anesthesia Home Care Instructions ° °Activity: °Get plenty of rest for the remainder of the day. A responsible adult should stay with you for 24 hours following the procedure.  °For the next 24 hours, DO NOT: °-Drive a car °-  Operate machinery °-Drink alcoholic beverages °-Take any medication unless instructed by your physician °-Make any legal decisions or sign important papers. ° °Meals: °Start with liquid foods such as gelatin or soup. Progress to regular foods as tolerated. Avoid greasy, spicy, heavy foods. If nausea and/or vomiting occur, drink only clear liquids until the nausea and/or vomiting subsides. Call your physician if vomiting continues. ° °Special Instructions/Symptoms: °Your throat may feel dry or sore from the  anesthesia or the breathing tube placed in your throat during surgery. If this causes discomfort, gargle with warm salt water. The discomfort should disappear within 24 hours. ° ° ° ° ° ° °

## 2013-12-20 ENCOUNTER — Encounter (HOSPITAL_BASED_OUTPATIENT_CLINIC_OR_DEPARTMENT_OTHER): Payer: Self-pay | Admitting: Surgery

## 2013-12-22 ENCOUNTER — Encounter: Payer: Self-pay | Admitting: Nurse Practitioner

## 2013-12-22 ENCOUNTER — Ambulatory Visit (HOSPITAL_BASED_OUTPATIENT_CLINIC_OR_DEPARTMENT_OTHER): Payer: 59

## 2013-12-22 ENCOUNTER — Ambulatory Visit (HOSPITAL_BASED_OUTPATIENT_CLINIC_OR_DEPARTMENT_OTHER): Payer: 59 | Admitting: Nurse Practitioner

## 2013-12-22 ENCOUNTER — Other Ambulatory Visit (HOSPITAL_BASED_OUTPATIENT_CLINIC_OR_DEPARTMENT_OTHER): Payer: 59

## 2013-12-22 VITALS — BP 137/96 | HR 81 | Temp 98.1°F | Resp 18 | Ht 69.0 in | Wt 233.8 lb

## 2013-12-22 DIAGNOSIS — C50411 Malignant neoplasm of upper-outer quadrant of right female breast: Secondary | ICD-10-CM

## 2013-12-22 DIAGNOSIS — D0510 Intraductal carcinoma in situ of unspecified breast: Secondary | ICD-10-CM

## 2013-12-22 DIAGNOSIS — Z170 Estrogen receptor positive status [ER+]: Secondary | ICD-10-CM

## 2013-12-22 DIAGNOSIS — Z800 Family history of malignant neoplasm of digestive organs: Secondary | ICD-10-CM

## 2013-12-22 DIAGNOSIS — G4700 Insomnia, unspecified: Secondary | ICD-10-CM

## 2013-12-22 DIAGNOSIS — Z803 Family history of malignant neoplasm of breast: Secondary | ICD-10-CM

## 2013-12-22 DIAGNOSIS — Z5111 Encounter for antineoplastic chemotherapy: Secondary | ICD-10-CM

## 2013-12-22 LAB — COMPREHENSIVE METABOLIC PANEL (CC13)
ALBUMIN: 3.6 g/dL (ref 3.5–5.0)
ALT: 22 U/L (ref 0–55)
ANION GAP: 10 meq/L (ref 3–11)
AST: 25 U/L (ref 5–34)
Alkaline Phosphatase: 97 U/L (ref 40–150)
BUN: 13.1 mg/dL (ref 7.0–26.0)
CHLORIDE: 106 meq/L (ref 98–109)
CO2: 25 meq/L (ref 22–29)
Calcium: 10.2 mg/dL (ref 8.4–10.4)
Creatinine: 0.8 mg/dL (ref 0.6–1.1)
Glucose: 89 mg/dl (ref 70–140)
Potassium: 4.1 mEq/L (ref 3.5–5.1)
SODIUM: 140 meq/L (ref 136–145)
Total Bilirubin: 0.51 mg/dL (ref 0.20–1.20)
Total Protein: 6.9 g/dL (ref 6.4–8.3)

## 2013-12-22 LAB — CBC WITH DIFFERENTIAL/PLATELET
BASO%: 0.7 % (ref 0.0–2.0)
Basophils Absolute: 0 10*3/uL (ref 0.0–0.1)
EOS%: 1.2 % (ref 0.0–7.0)
Eosinophils Absolute: 0 10*3/uL (ref 0.0–0.5)
HEMATOCRIT: 36.2 % (ref 34.8–46.6)
HGB: 11.4 g/dL — ABNORMAL LOW (ref 11.6–15.9)
LYMPH#: 0.6 10*3/uL — AB (ref 0.9–3.3)
LYMPH%: 17.3 % (ref 14.0–49.7)
MCH: 25.9 pg (ref 25.1–34.0)
MCHC: 31.6 g/dL (ref 31.5–36.0)
MCV: 81.8 fL (ref 79.5–101.0)
MONO#: 0.4 10*3/uL (ref 0.1–0.9)
MONO%: 12.5 % (ref 0.0–14.0)
NEUT#: 2.3 10*3/uL (ref 1.5–6.5)
NEUT%: 68.3 % (ref 38.4–76.8)
Platelets: 210 10*3/uL (ref 145–400)
RBC: 4.42 10*6/uL (ref 3.70–5.45)
RDW: 19.3 % — ABNORMAL HIGH (ref 11.2–14.5)
WBC: 3.4 10*3/uL — AB (ref 3.9–10.3)

## 2013-12-22 MED ORDER — DEXAMETHASONE SODIUM PHOSPHATE 10 MG/ML IJ SOLN
4.0000 mg | Freq: Once | INTRAMUSCULAR | Status: AC
  Administered 2013-12-22: 4 mg via INTRAVENOUS

## 2013-12-22 MED ORDER — DEXAMETHASONE SODIUM PHOSPHATE 10 MG/ML IJ SOLN
INTRAMUSCULAR | Status: AC
  Filled 2013-12-22: qty 1

## 2013-12-22 MED ORDER — FAMOTIDINE IN NACL 20-0.9 MG/50ML-% IV SOLN
20.0000 mg | Freq: Once | INTRAVENOUS | Status: AC
  Administered 2013-12-22: 20 mg via INTRAVENOUS

## 2013-12-22 MED ORDER — DIPHENHYDRAMINE HCL 50 MG/ML IJ SOLN
25.0000 mg | Freq: Once | INTRAMUSCULAR | Status: AC
  Administered 2013-12-22: 25 mg via INTRAVENOUS

## 2013-12-22 MED ORDER — HEPARIN SOD (PORK) LOCK FLUSH 100 UNIT/ML IV SOLN
500.0000 [IU] | Freq: Once | INTRAVENOUS | Status: AC | PRN
  Administered 2013-12-22: 500 [IU]
  Filled 2013-12-22: qty 5

## 2013-12-22 MED ORDER — ONDANSETRON 8 MG/NS 50 ML IVPB
INTRAVENOUS | Status: AC
  Filled 2013-12-22: qty 8

## 2013-12-22 MED ORDER — ONDANSETRON 8 MG/50ML IVPB (CHCC)
8.0000 mg | Freq: Once | INTRAVENOUS | Status: AC
  Administered 2013-12-22: 8 mg via INTRAVENOUS

## 2013-12-22 MED ORDER — DIPHENHYDRAMINE HCL 50 MG/ML IJ SOLN
INTRAMUSCULAR | Status: AC
  Filled 2013-12-22: qty 1

## 2013-12-22 MED ORDER — SODIUM CHLORIDE 0.9 % IV SOLN
Freq: Once | INTRAVENOUS | Status: AC
  Administered 2013-12-22: 13:00:00 via INTRAVENOUS

## 2013-12-22 MED ORDER — PACLITAXEL CHEMO INJECTION 300 MG/50ML
80.0000 mg/m2 | Freq: Once | INTRAVENOUS | Status: AC
  Administered 2013-12-22: 180 mg via INTRAVENOUS
  Filled 2013-12-22: qty 30

## 2013-12-22 MED ORDER — SODIUM CHLORIDE 0.9 % IJ SOLN
10.0000 mL | INTRAMUSCULAR | Status: DC | PRN
  Administered 2013-12-22: 10 mL
  Filled 2013-12-22: qty 10

## 2013-12-22 MED ORDER — FAMOTIDINE IN NACL 20-0.9 MG/50ML-% IV SOLN
INTRAVENOUS | Status: AC
  Filled 2013-12-22: qty 50

## 2013-12-22 NOTE — Progress Notes (Signed)
[place]  [phone] [phone]     ID: Jennifer Frazier DOB: December 02, 1975  MR#: [id number]  GQB#:[id number]  Patient Care Team: Sharilyn Sites, MD as PCP - General (Family Medicine) Chauncey Cruel, MD as Consulting Physician (Oncology) Thea Silversmith, MD as Consulting Physician (Radiation Oncology) [name], MD as Consulting Physician (General Surgery)   CHIEF COMPLAINT: Estrogen receptor positive stage I breast cancer CURRENT TREATMENT: receiving adjuvant chemotherapy   BREAST CANCER HISTORY: From the original intake note:  The patient had screening mammography at [facility] showing calcifications in the right breast, and was referred to the breast Center 07/06/2013 for additional views. Right diagnostic mammography confirmed a group of amorphous calcifications in the upper outer right breast measuring 1.2 cm.  Biopsy of this area 07/17/2013 showed ([id number]) ductal carcinoma in situ, high-grade, estrogen receptor 100% positive, and progesterone receptor 96% positive, both with strong staining intensity.  The patient was then referred to surgery and on 07/25/2013 underwent bilateral breast MRI. This showed a breast density category CEA. In the right breast there was an area of clumped nodular enhancement measuring 3 cm. There was also a 1.5 cm circumscribed oval mass in the medial portion of the right breast consistent with a fibroadenoma. The left breast was unremarkable, and there were no abnormal appearing lymph nodes.  On 08/08/2013 the patient underwent right lumpectomy and sentinel lymph node sampling. The pathology ([id number] was (showed, in addition to ductal carcinoma in situ, invasive ductal carcinoma, measuring 0.7 cm, grade 3, estrogen receptor 86% positive, progesterone receptor 89% positive, both with strong staining intensity, with an MIB-1 of 62%, and no HER-2 amplification. Both sentinel lymph nodes were clear.  Margins were ample.   The patient's subsequent history is as detailed below  INTERVAL HISTORY: [name] returns today for follow up of her breast cancer, accompanied by her mother. Today is day 1 cycle 3 of 12 planned cycles of paclitaxel. Last week treatment was delayed so that she could have her port replaced after it had "flipped" and was unable to be accessed. She has only had to use 2 doses of norco to control the pain. Otherwise, the interval history is completely unremarkable. She is still having trouble sleeping and with hot flashes, but the gabapentin is helpful. She is still concerned about her weight gain and plans to start using the elliptical machine in addition to starting back on a paleo diet.  REVIEW OF SYSTEMS: Mima denies fevers, chills, nausea, vomiting, or change in bladder habits. She has no mouth sores, rashes, or peripheral neuropathy symptoms. She denies shortness of breath, chest pain, cough, palpitations, or excessive fatigue.  A detailed review of systems was otherwise noncontributory.  PAST MEDICAL HISTORY: Past Medical History  Diagnosis Date  . Fibroids     uterine fibroids  . Anemia   . Thyroid disease   . Contact lens/glasses fitting     wears contacts or glasses  . Sleep apnea     has a cpap-does not always use it    PAST SURGICAL HISTORY: Past Surgical History  Procedure Laterality Date  . [place]  2012    urerine ablasion  . Eye surgery  2013    retinal tear-lazer-both  . Wisdom tooth extraction    . Breast surgery      lumpectomy 7/14  . Portacath placement Right 09/29/2013    Procedure: INSERTION PORT-A-CATH WITH ULTRA SOUND;  Surgeon: [name], MD;  Location: [facility];  Service: General;  Laterality: Right;  . Port a cath revision N/A 12/19/2013    Procedure: REPOSITION PORT;  Surgeon: [name], MD;  Location: [facility];  Service: General;  Laterality: N/A;    FAMILY HISTORY Family History  Problem  Relation Age of Onset  . Cancer Father 70    colon  . Cancer Maternal Aunt 50    breast cancer  . Cancer Paternal Aunt 65    breast cancer  . Cancer Maternal Grandmother 53    ovarian or uterine cancer  . Cancer Paternal Grandmother     unknown primary   the patient's father died at the age of 40, been diagnosed with colon cancer at the age of 40. The patient's mother is living, currently 40 years old. The patient has one brother, no sisters. One of the patient's mother's sisters was diagnosed with breast cancer at the age of 75 in one of the patient's father's mother was diagnosed with breast cancer at the age of 51. There is no history of ovarian cancer in the family.  GYNECOLOGIC HISTORY:  Patient's last menstrual period was 12/01/2013. Menarche age 13, for the patient is GX P0. She still having regular periods. 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 She is single and her mother lives with her.(The patient's mother is wheelchair-bound secondary to a stroke).    ADVANCED DIRECTIVES: Not in place; at the 08/24/2013 visit the patient was given the appropriate documents to come feel notarize associate may declare healthcare power of attorney.   HEALTH MAINTENANCE: History  Substance Use Topics  . Smoking status: Never Smoker   . Smokeless tobacco: Not on file  . Alcohol Use: No     Colonoscopy:  PAP:  Bone density:  Lipid panel:  Allergies  Allergen Reactions  . Gadolinium      Desc: NAUSEA WITH MRI CONTRAST-NO VOMITING   . Tramadol Hives and Nausea Only    Current Outpatient Prescriptions  Medication Sig Dispense Refill  . cephALEXin (KEFLEX) 500 MG capsule Take 1 capsule (500 mg total) by mouth 4 (four) times daily. 40 capsule 0  . Cholecalciferol (VITAMIN D3) 3000 UNITS TABS Take 10,000 Units by mouth daily.    [name] gabapentin (NEURONTIN)  300 MG capsule Take 1 capsule (300 mg total) by mouth 3 (three) times daily. 30 capsule 3  . HYDROcodone-acetaminophen (NORCO) 5-325 MG per tablet Take 1 tablet by mouth every 6 (six) hours as needed for moderate pain. 30 tablet 0  . ibuprofen (ADVIL,MOTRIN) 100 MG/5ML suspension Take 200 mg by mouth every 4 (four) hours as needed.    . lidocaine-prilocaine (EMLA) cream Apply 1 application topically as needed. 30 g 0  . promethazine (PHENERGAN) 25 MG tablet Take 25 mg by mouth every 6 (six) hours as needed for nausea or vomiting.    . thyroid (ARMOUR) 130 MG tablet Take 130 mg by mouth daily.    . traZODone (DESYREL) 50 MG tablet Take 1 tablet (50 mg total) by mouth at bedtime. 30 tablet 2  . vitamin B-12 (CYANOCOBALAMIN) 1000 MCG tablet Take 5,000 mcg by mouth daily.     No current facility-administered medications for this visit.    OBJECTIVE: Middle-aged [place] American woman who appears stated age 84 Vitals:   12/22/13 1140  BP: 137/96  Pulse: 81  Temp: 98.1 F (36.7 C)  Resp: 18  Body mass index is 34.51 kg/(m^2).    ECOG FS:1 - Symptomatic but completely ambulatory   Sclerae unicteric, pupils equal and reactive Oropharynx clear and moist-- no thrush No cervical or supraclavicular adenopathy Lungs no rales or rhonchi Heart regular rate and rhythm Abd soft, nontender, positive bowel sounds MSK no focal spinal tenderness, no upper extremity lymphedema Neuro: nonfocal, well oriented, appropriate affect Breasts: deferred  LAB RESULTS:  CMP     Component Value Date/Time   NA 140 12/07/2013 1139   NA 140 10/12/2013 1628   K 3.8 12/07/2013 1139   K 4.3 10/12/2013 1628   CL 104 10/12/2013 1628   CO2 24 12/07/2013 1139   CO2 26 10/12/2013 1628   GLUCOSE 83 12/07/2013 1139   GLUCOSE 89 10/12/2013 1628   BUN 21.4 12/07/2013 1139   BUN 15 10/12/2013 1628   CREATININE 0.8 12/07/2013 1139   CREATININE 0.74 10/12/2013 1628   CALCIUM 9.9 12/07/2013 1139   CALCIUM 9.6  10/12/2013 1628   PROT 7.1 12/07/2013 1139   PROT 6.7 10/12/2013 1628   ALBUMIN 3.9 12/07/2013 1139   ALBUMIN 3.3* 10/12/2013 1628   AST 20 12/07/2013 1139   AST 15 10/12/2013 1628   ALT 18 12/07/2013 1139   ALT 16 10/12/2013 1628   ALKPHOS 77 12/07/2013 1139   ALKPHOS 93 10/12/2013 1628   BILITOT 0.42 12/07/2013 1139   BILITOT 0.4 10/12/2013 1628   GFRNONAA >90 08/04/2013 1245   GFRAA >90 08/04/2013 1245    I No results found for: SPEP  Lab Results  Component Value Date   WBC 3.4* 12/22/2013   NEUTROABS 2.3 12/22/2013   HGB 11.4* 12/22/2013   HCT 36.2 12/22/2013   MCV 81.8 12/22/2013   PLT 210 12/22/2013      Chemistry      Component Value Date/Time   NA 140 12/07/2013 1139   NA 140 10/12/2013 1628   K 3.8 12/07/2013 1139   K 4.3 10/12/2013 1628   CL 104 10/12/2013 1628   CO2 24 12/07/2013 1139   CO2 26 10/12/2013 1628   BUN 21.4 12/07/2013 1139   BUN 15 10/12/2013 1628   CREATININE 0.8 12/07/2013 1139   CREATININE 0.74 10/12/2013 1628      Component Value Date/Time   CALCIUM 9.9 12/07/2013 1139   CALCIUM 9.6 10/12/2013 1628   ALKPHOS 77 12/07/2013 1139   ALKPHOS 93 10/12/2013 1628   AST 20 12/07/2013 1139   AST 15 10/12/2013 1628   ALT 18 12/07/2013 1139   ALT 16 10/12/2013 1628   BILITOT 0.42 12/07/2013 1139   BILITOT 0.4 10/12/2013 1628       No results found for: LABCA2  No components found for: LABCA125  No results for input(s): INR in the last 168 hours.  Urinalysis    Component Value Date/Time   COLORURINE YELLOW 10/21/2009 1440   APPEARANCEUR CLEAR 10/21/2009 1440   LABSPEC 1.012 10/21/2009 1440   PHURINE 6.0 10/21/2009 1440   GLUCOSEU NEGATIVE 10/21/2009 1440   HGBUR NEGATIVE 10/21/2009 1440   BILIRUBINUR NEGATIVE 10/21/2009 1440   KETONESUR 15* 10/21/2009 1440   PROTEINUR NEGATIVE 10/21/2009 1440   UROBILINOGEN 0.2 10/21/2009 1440   NITRITE NEGATIVE 10/21/2009 1440   LEUKOCYTESUR  10/21/2009 1440    NEGATIVE MICROSCOPIC NOT  DONE ON URINES WITH NEGATIVE PROTEIN, BLOOD, LEUKOCYTES, NITRITE, OR GLUCOSE <1000 mg/dL.    STUDIES: No results found.  ASSESSMENT: 38 y.o. BRCA negative [place] woman status post right breast biopsy 07/17/2013 for high-grade  ductal carcinoma in situ, estrogen and progesterone receptor positive  (1) status post right lumpectomy and sentinel lymph node sampling 08/08/2013 for a pT1b pN0, stage IA invasive ductal carcinoma, grade 3, estrogen and progesterone receptor positive, with an MIB-1 of 62% and no HER-2 amplification  (2) genetics sent 08/02/2013 was normal but did identify a variant of uncertain significance called BRCA2, [id number].  (3) Oncotype score of 23 predicts a risk of outside the breast recurrence within 10 years of 15% if the patient's only systemic treatment is tamoxifen for 5 years. Adjuvant chemotherapy was recommended  (4) doxorubicin and cyclophosphamide in dose dense fashion x4 with Neulasta support to start 10/05/2013, to be followed by paclitaxel weekly x12  (5) adjuvant radiation to follow chemotherapy  (6) anti-estrogens for 5-10 years to follow radiation  (7) "flipped port:" The port head in Aryona's anterior chest appeared unusually mobile. Port replaced on 12/19/13  PLAN: Marykatherine is ready to restart treatment. The labs were reviewed in detail and were completely stable. We will proceed with cycle 3 of paclitaxel today.   For her insomnia I encouraged her to try benadryl or melation QHS. She will continue the gabapentin as needed for hot flashes.   Milta will return next week for labs, an office visit, and cycle 4 of paclitaxel. She understands and agrees with this plan. She knows the goal of treatment in her case is cure. She has been encouraged to call with any issues that might arise before her next visit here. Marcelino Duster, NP   12/22/2013 11:50 AM

## 2013-12-22 NOTE — Patient Instructions (Signed)
[facility] Discharge Instructions for Patients Receiving Chemotherapy  Today you received the following chemotherapy agents: Taxol  To help prevent nausea and vomiting after your treatment, we encourage you to take your nausea medication as prescribed by your physician.  If you develop nausea and vomiting that is not controlled by your nausea medication, call the clinic.   BELOW ARE SYMPTOMS THAT SHOULD BE REPORTED IMMEDIATELY:  *FEVER GREATER THAN 100.5 F  *CHILLS WITH OR WITHOUT FEVER  NAUSEA AND VOMITING THAT IS NOT CONTROLLED WITH YOUR NAUSEA MEDICATION  *UNUSUAL SHORTNESS OF BREATH  *UNUSUAL BRUISING OR BLEEDING  TENDERNESS IN MOUTH AND THROAT WITH OR WITHOUT PRESENCE OF ULCERS  *URINARY PROBLEMS  *BOWEL PROBLEMS  UNUSUAL RASH Items with * indicate a potential emergency and should be followed up as soon as possible.  Feel free to call the clinic you have any questions or concerns. The clinic phone number is [phone].    

## 2013-12-29 ENCOUNTER — Other Ambulatory Visit (HOSPITAL_BASED_OUTPATIENT_CLINIC_OR_DEPARTMENT_OTHER): Payer: 59

## 2013-12-29 ENCOUNTER — Ambulatory Visit (HOSPITAL_BASED_OUTPATIENT_CLINIC_OR_DEPARTMENT_OTHER): Payer: 59

## 2013-12-29 ENCOUNTER — Ambulatory Visit (HOSPITAL_BASED_OUTPATIENT_CLINIC_OR_DEPARTMENT_OTHER): Payer: 59 | Admitting: Nurse Practitioner

## 2013-12-29 ENCOUNTER — Encounter: Payer: Self-pay | Admitting: Nurse Practitioner

## 2013-12-29 VITALS — BP 137/90 | HR 80 | Temp 98.2°F | Resp 20 | Ht 69.0 in | Wt 231.1 lb

## 2013-12-29 DIAGNOSIS — D0510 Intraductal carcinoma in situ of unspecified breast: Secondary | ICD-10-CM

## 2013-12-29 DIAGNOSIS — N951 Menopausal and female climacteric states: Secondary | ICD-10-CM

## 2013-12-29 DIAGNOSIS — F329 Major depressive disorder, single episode, unspecified: Secondary | ICD-10-CM

## 2013-12-29 DIAGNOSIS — Z800 Family history of malignant neoplasm of digestive organs: Secondary | ICD-10-CM

## 2013-12-29 DIAGNOSIS — F32A Depression, unspecified: Secondary | ICD-10-CM

## 2013-12-29 DIAGNOSIS — R232 Flushing: Secondary | ICD-10-CM | POA: Insufficient documentation

## 2013-12-29 DIAGNOSIS — Z803 Family history of malignant neoplasm of breast: Secondary | ICD-10-CM

## 2013-12-29 DIAGNOSIS — C50411 Malignant neoplasm of upper-outer quadrant of right female breast: Secondary | ICD-10-CM

## 2013-12-29 DIAGNOSIS — Z170 Estrogen receptor positive status [ER+]: Secondary | ICD-10-CM

## 2013-12-29 DIAGNOSIS — G4700 Insomnia, unspecified: Secondary | ICD-10-CM

## 2013-12-29 DIAGNOSIS — Z5111 Encounter for antineoplastic chemotherapy: Secondary | ICD-10-CM

## 2013-12-29 LAB — CBC WITH DIFFERENTIAL/PLATELET
BASO%: 1.2 % (ref 0.0–2.0)
BASOS ABS: 0 10*3/uL (ref 0.0–0.1)
EOS%: 2.9 % (ref 0.0–7.0)
Eosinophils Absolute: 0.1 10*3/uL (ref 0.0–0.5)
HCT: 35.6 % (ref 34.8–46.6)
HEMOGLOBIN: 11.8 g/dL (ref 11.6–15.9)
LYMPH%: 22 % (ref 14.0–49.7)
MCH: 26.6 pg (ref 25.1–34.0)
MCHC: 33.1 g/dL (ref 31.5–36.0)
MCV: 80.2 fL (ref 79.5–101.0)
MONO#: 0.1 10*3/uL (ref 0.1–0.9)
MONO%: 3.8 % (ref 0.0–14.0)
NEUT#: 2.4 10*3/uL (ref 1.5–6.5)
NEUT%: 70.1 % (ref 38.4–76.8)
Platelets: 197 10*3/uL (ref 145–400)
RBC: 4.44 10*6/uL (ref 3.70–5.45)
RDW: 16.4 % — AB (ref 11.2–14.5)
WBC: 3.5 10*3/uL — ABNORMAL LOW (ref 3.9–10.3)
lymph#: 0.8 10*3/uL — ABNORMAL LOW (ref 0.9–3.3)
nRBC: 0 % (ref 0–0)

## 2013-12-29 MED ORDER — DIPHENHYDRAMINE HCL 50 MG/ML IJ SOLN
25.0000 mg | Freq: Once | INTRAMUSCULAR | Status: AC
  Administered 2013-12-29: 25 mg via INTRAVENOUS

## 2013-12-29 MED ORDER — ONDANSETRON 8 MG/50ML IVPB (CHCC)
8.0000 mg | Freq: Once | INTRAVENOUS | Status: AC
  Administered 2013-12-29: 8 mg via INTRAVENOUS

## 2013-12-29 MED ORDER — HEPARIN SOD (PORK) LOCK FLUSH 100 UNIT/ML IV SOLN
500.0000 [IU] | Freq: Once | INTRAVENOUS | Status: AC | PRN
  Administered 2013-12-29: 500 [IU]
  Filled 2013-12-29: qty 5

## 2013-12-29 MED ORDER — PACLITAXEL CHEMO INJECTION 300 MG/50ML
80.0000 mg/m2 | Freq: Once | INTRAVENOUS | Status: AC
  Administered 2013-12-29: 180 mg via INTRAVENOUS
  Filled 2013-12-29: qty 30

## 2013-12-29 MED ORDER — FAMOTIDINE IN NACL 20-0.9 MG/50ML-% IV SOLN
20.0000 mg | Freq: Once | INTRAVENOUS | Status: AC
  Administered 2013-12-29: 20 mg via INTRAVENOUS

## 2013-12-29 MED ORDER — SODIUM CHLORIDE 0.9 % IV SOLN
Freq: Once | INTRAVENOUS | Status: AC
  Administered 2013-12-29: 13:00:00 via INTRAVENOUS

## 2013-12-29 MED ORDER — SODIUM CHLORIDE 0.9 % IJ SOLN
10.0000 mL | INTRAMUSCULAR | Status: DC | PRN
  Administered 2013-12-29: 10 mL
  Filled 2013-12-29: qty 10

## 2013-12-29 MED ORDER — VENLAFAXINE HCL ER 75 MG PO CP24: 75.0000 mg | ORAL_CAPSULE | Freq: Every day | ORAL | Status: DC

## 2013-12-29 MED ORDER — DEXAMETHASONE SODIUM PHOSPHATE 20 MG/5ML IJ SOLN
20.0000 mg | Freq: Once | INTRAMUSCULAR | Status: AC
  Administered 2013-12-29: 20 mg via INTRAVENOUS

## 2013-12-29 NOTE — Patient Instructions (Signed)
[facility] Discharge Instructions for Patients Receiving Chemotherapy  Today you received the following chemotherapy agents: Taxol.  To help prevent nausea and vomiting after your treatment, we encourage you to take your nausea medication as prescribed.   If you develop nausea and vomiting that is not controlled by your nausea medication, call the clinic.   BELOW ARE SYMPTOMS THAT SHOULD BE REPORTED IMMEDIATELY:  *FEVER GREATER THAN 100.5 F  *CHILLS WITH OR WITHOUT FEVER  NAUSEA AND VOMITING THAT IS NOT CONTROLLED WITH YOUR NAUSEA MEDICATION  *UNUSUAL SHORTNESS OF BREATH  *UNUSUAL BRUISING OR BLEEDING  TENDERNESS IN MOUTH AND THROAT WITH OR WITHOUT PRESENCE OF ULCERS  *URINARY PROBLEMS  *BOWEL PROBLEMS  UNUSUAL RASH Items with * indicate a potential emergency and should be followed up as soon as possible.  Feel free to call the clinic you have any questions or concerns. The clinic phone number is [phone].    

## 2013-12-29 NOTE — Progress Notes (Signed)
Jennifer Frazier  [phone] Fax:([phone]) [phone]     ID: Jennifer Frazier DOB: 08-Mar-1975  MR#: [id number]  ZGY#:[id number]  Patient Care Team: Sharilyn Sites, MD as PCP - General (Family Medicine) Chauncey Cruel, MD as Consulting Physician (Oncology) Thea Silversmith, MD as Consulting Physician (Radiation Oncology) [name], MD as Consulting Physician (General Surgery)   CHIEF COMPLAINT: Estrogen receptor positive stage I breast cancer CURRENT TREATMENT: receiving adjuvant chemotherapy   BREAST CANCER HISTORY: From the original intake note:  The patient had screening mammography at [place] showing calcifications in the right breast, and was referred to the breast Center 07/06/2013 for additional views. Right diagnostic mammography confirmed a group of amorphous calcifications in the upper outer right breast measuring 1.2 cm.  Biopsy of this area 07/17/2013 showed ([id number]) ductal carcinoma in situ, high-grade, estrogen receptor 100% positive, and progesterone receptor 96% positive, both with strong staining intensity.  The patient was then referred to surgery and on 07/25/2013 underwent bilateral breast MRI. This showed a breast density category CEA. In the right breast there was an area of clumped nodular enhancement measuring 3 cm. There was also a 1.5 cm circumscribed oval mass in the medial portion of the right breast consistent with a fibroadenoma. The left breast was unremarkable, and there were no abnormal appearing lymph nodes.  On 08/08/2013 the patient underwent right lumpectomy and sentinel lymph node sampling. The pathology ([id number] was (showed, in addition to ductal carcinoma in situ, invasive ductal carcinoma, measuring 0.7 cm, grade 3, estrogen receptor 86% positive, progesterone receptor 89% positive, both with strong staining intensity, with an MIB-1 of 62%, and no HER-2 amplification. Both sentinel lymph nodes were clear.  Margins were ample.   The patient's subsequent history is as detailed below  INTERVAL HISTORY: Jennifer Frazier returns today for follow up of her breast cancer, accompanied by her mother. Today is day 1 cycle 4 of 12 planned cycles of paclitaxel. She continues to have trouble sleeping and with hot flashes and the gabapentin is simply not helpful. At this point she's tried melatonin, benadryl, gabapentin, lorazepam, and trazodone. None of these medications were effective. She is getting around 2-3 hrs of sleep nightly. Some of this she states may be related to depression and anxiety. She does not have a great outlet for these emotions.  REVIEW OF SYSTEMS: Jennifer Frazier denies fevers, chills, nausea, vomiting, or change in bladder habits. She has no mouth sores, rashes, or peripheral neuropathy symptoms. She denies shortness of breath, chest pain, cough, palpitations, or excessive fatigue. Her new port is working very well. A detailed review of systems was otherwise noncontributory.  PAST MEDICAL HISTORY: Past Medical History  Diagnosis Date  . Fibroids     uterine fibroids  . Anemia   . Thyroid disease   . Contact lens/glasses fitting     wears contacts or glasses  . Sleep apnea     has a cpap-does not always use it    PAST SURGICAL HISTORY: Past Surgical History  Procedure Laterality Date  . [place]  2012    urerine ablasion  . Eye surgery  2013    retinal tear-lazer-both  . Wisdom tooth extraction    . Breast surgery      lumpectomy 7/14  . Portacath placement Right 09/29/2013    Procedure: INSERTION PORT-A-CATH WITH ULTRA SOUND;  Surgeon: [name], MD;  Location: [facility];  Service: General;  Laterality: Right;  . Port a cath revision N/A  12/19/2013    Procedure: REPOSITION PORT;  Surgeon: [name], MD;  Location: [facility];  Service: General;  Laterality: N/A;    FAMILY HISTORY Family History  Problem Relation Age of Onset  . Cancer Father 44     colon  . Cancer Maternal Aunt 50    breast cancer  . Cancer Paternal Aunt 51    breast cancer  . Cancer Maternal Grandmother 28    ovarian or uterine cancer  . Cancer Paternal Grandmother     unknown primary   the patient's father died at the age of 10, been diagnosed with colon cancer at the age of 54. The patient's mother is living, currently 72 years old. The patient has one brother, no sisters. One of the patient's mother's sisters was diagnosed with breast cancer at the age of 77 in one of the patient's father's mother was diagnosed with breast cancer at the age of 72. There is no history of ovarian cancer in the family.  GYNECOLOGIC HISTORY:  Patient's last menstrual period was 12/01/2013. Menarche age 66, for the patient is GX P0. She still having regular periods. 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    ADVANCED DIRECTIVES: Not in place; at the 08/24/2013 visit the patient was given the appropriate documents to come feel notarize associate may declare healthcare power of attorney.   HEALTH MAINTENANCE: History  Substance Use Topics  . Smoking status: Never Smoker   . Smokeless tobacco: Not on file  . Alcohol Use: No     Colonoscopy:  PAP:  Bone density:  Lipid panel:  Allergies  Allergen Reactions  . Gadolinium      Desc: NAUSEA WITH MRI CONTRAST-NO VOMITING   . Tramadol Hives and Nausea Only    Current Outpatient Prescriptions  Medication Sig Dispense Refill  . cephALEXin (KEFLEX) 500 MG capsule Take 1 capsule (500 mg total) by mouth 4 (four) times daily. 40 capsule 0  . Cholecalciferol (VITAMIN D3) 3000 UNITS TABS Take 10,000 Units by mouth daily.    [name] HYDROcodone-acetaminophen (NORCO) 5-325 MG per tablet Take 1 tablet by  mouth every 6 (six) hours as needed for moderate pain. 30 tablet 0  . lidocaine-prilocaine (EMLA) cream Apply 1 application topically as needed. 30 g 0  . thyroid (ARMOUR) 130 MG tablet Take 130 mg by mouth daily.    . vitamin B-12 (CYANOCOBALAMIN) 1000 MCG tablet Take 5,000 mcg by mouth daily.    [name] gabapentin (NEURONTIN) 300 MG capsule Take 1 capsule (300 mg total) by mouth 3 (three) times daily. (Patient not taking: Reported on 12/22/2013) 30 capsule 3  . ibuprofen (ADVIL,MOTRIN) 100 MG/5ML suspension Take 200 mg by mouth every 4 (four) hours as needed.    . promethazine (PHENERGAN) 25 MG tablet Take 25 mg by mouth every 6 (six) hours as needed for nausea or vomiting.    . traZODone (DESYREL) 50 MG tablet Take 1 tablet (50 mg total) by mouth at bedtime. (Patient not taking: Reported on 12/22/2013) 30 tablet 2  . venlafaxine XR (EFFEXOR-XR) 75 MG 24 hr capsule Take 1 capsule (75 mg total) by mouth daily with breakfast. 30 capsule 2   No current facility-administered medications for this visit.   Facility-Administered Medications Ordered in Other Visits  Medication Dose Route Frequency  Provider Last Rate Last Dose  . 0.9 %  sodium chloride infusion   Intravenous Once Chauncey Cruel, MD      . Dexamethasone Sodium Phosphate (DECADRON) injection 20 mg  20 mg Intravenous Once Chauncey Cruel, MD      . diphenhydrAMINE (BENADRYL) injection 25 mg  25 mg Intravenous Once Chauncey Cruel, MD      . famotidine (PEPCID) IVPB 20 mg  20 mg Intravenous Once Chauncey Cruel, MD      . heparin lock flush 100 unit/mL  500 Units Intracatheter Once PRN Chauncey Cruel, MD      . ondansetron (ZOFRAN) IVPB 8 mg  8 mg Intravenous Once Chauncey Cruel, MD      . PACLitaxel (TAXOL) 180 mg in dextrose 5 % 250 mL chemo infusion (</= 64m/m2)  80 mg/m2 (Treatment Plan Actual) Intravenous Once GChauncey Cruel MD      . sodium chloride 0.9 % injection 10 mL  10 mL Intracatheter PRN GChauncey Cruel MD         OBJECTIVE: Middle-aged [place] woman who appears stated age F85Vitals:   12/29/13 1156  BP: 137/90  Pulse: 80  Temp: 98.2 F (36.8 C)  Resp: 20     Body mass index is 34.11 kg/(m^2).    ECOG FS:1 - Symptomatic but completely ambulatory   Skin: warm, dry  HEENT: sclerae anicteric, conjunctivae pink, oropharynx clear. No thrush or mucositis.  Lymph Nodes: No cervical or supraclavicular lymphadenopathy  Lungs: clear to auscultation bilaterally, no rales, wheezes, or rhonci  Heart: regular rate and rhythm  Abdomen: round, soft, non tender, positive bowel sounds  Musculoskeletal: No focal spinal tenderness, no peripheral edema  Neuro: non focal, well oriented, positive affect  Breasts: deferred  LAB RESULTS:  CMP     Component Value Date/Time   NA 140 12/22/2013 1126   NA 140 10/12/2013 1628   K 4.1 12/22/2013 1126   K 4.3 10/12/2013 1628   CL 104 10/12/2013 1628   CO2 25 12/22/2013 1126   CO2 26 10/12/2013 1628   GLUCOSE 89 12/22/2013 1126   GLUCOSE 89 10/12/2013 1628   BUN 13.1 12/22/2013 1126   BUN 15 10/12/2013 1628   CREATININE 0.8 12/22/2013 1126   CREATININE 0.74 10/12/2013 1628   CALCIUM 10.2 12/22/2013 1126   CALCIUM 9.6 10/12/2013 1628   PROT 6.9 12/22/2013 1126   PROT 6.7 10/12/2013 1628   ALBUMIN 3.6 12/22/2013 1126   ALBUMIN 3.3* 10/12/2013 1628   AST 25 12/22/2013 1126   AST 15 10/12/2013 1628   ALT 22 12/22/2013 1126   ALT 16 10/12/2013 1628   ALKPHOS 97 12/22/2013 1126   ALKPHOS 93 10/12/2013 1628   BILITOT 0.51 12/22/2013 1126   BILITOT 0.4 10/12/2013 1628   GFRNONAA >90 08/04/2013 1245   GFRAA >90 08/04/2013 1245    I No results found for: SPEP  Lab Results  Component Value Date   WBC 3.4* 12/22/2013   NEUTROABS 2.3 12/22/2013   HGB 11.4* 12/22/2013   HCT 36.2 12/22/2013   MCV 81.8 12/22/2013   PLT 210 12/22/2013      Chemistry      Component Value Date/Time   NA 140 12/22/2013 1126   NA 140 10/12/2013 1628   K  4.1 12/22/2013 1126   K 4.3 10/12/2013 1628   CL 104 10/12/2013 1628   CO2 25 12/22/2013 1126   CO2 26 10/12/2013 1628   BUN 13.1  12/22/2013 1126   BUN 15 10/12/2013 1628   CREATININE 0.8 12/22/2013 1126   CREATININE 0.74 10/12/2013 1628      Component Value Date/Time   CALCIUM 10.2 12/22/2013 1126   CALCIUM 9.6 10/12/2013 1628   ALKPHOS 97 12/22/2013 1126   ALKPHOS 93 10/12/2013 1628   AST 25 12/22/2013 1126   AST 15 10/12/2013 1628   ALT 22 12/22/2013 1126   ALT 16 10/12/2013 1628   BILITOT 0.51 12/22/2013 1126   BILITOT 0.4 10/12/2013 1628       No results found for: LABCA2  No components found for: LABCA125  No results for input(s): INR in the last 168 hours.  Urinalysis    Component Value Date/Time   COLORURINE YELLOW 10/21/2009 1440   APPEARANCEUR CLEAR 10/21/2009 1440   LABSPEC 1.012 10/21/2009 1440   PHURINE 6.0 10/21/2009 1440   GLUCOSEU NEGATIVE 10/21/2009 1440   HGBUR NEGATIVE 10/21/2009 1440   BILIRUBINUR NEGATIVE 10/21/2009 1440   KETONESUR 15* 10/21/2009 1440   PROTEINUR NEGATIVE 10/21/2009 1440   UROBILINOGEN 0.2 10/21/2009 1440   NITRITE NEGATIVE 10/21/2009 1440   LEUKOCYTESUR  10/21/2009 1440    NEGATIVE MICROSCOPIC NOT DONE ON URINES WITH NEGATIVE PROTEIN, BLOOD, LEUKOCYTES, NITRITE, OR GLUCOSE <1000 mg/dL.    STUDIES: No results found.  ASSESSMENT: 38 y.o. BRCA negative Jennifer Frazier woman status post right breast biopsy 07/17/2013 for high-grade ductal carcinoma in situ, estrogen and progesterone receptor positive  (1) status post right lumpectomy and sentinel lymph node sampling 08/08/2013 for a pT1b pN0, stage IA invasive ductal carcinoma, grade 3, estrogen and progesterone receptor positive, with an MIB-1 of 62% and no HER-2 amplification  (2) genetics sent 08/02/2013 was normal but did identify a variant of uncertain significance called BRCA2, [id number].  (3) Oncotype score of 23 predicts a risk of outside the breast recurrence within 10  years of 15% if the patient's only systemic treatment is tamoxifen for 5 years. Adjuvant chemotherapy was recommended  (4) doxorubicin and cyclophosphamide in dose dense fashion x4 with Neulasta support to start 10/05/2013, to be followed by paclitaxel weekly x12  (5) adjuvant radiation to follow chemotherapy  (6) anti-estrogens for 5-10 years to follow radiation  (7) "flipped port:" The port head in Jennifer Frazier's anterior chest appeared unusually mobile. Port replaced on 12/19/13  PLAN: Chakara continues to do well overall. The CBC was reviewed in detail and was stable. She will proceed with cycle 4 of paclitaxel this week.   We discussed her mood affecting her inability to sleep along with the hot flashes. The gabapentin was not helpful, so we discussed beginning venlafaxine with the idea that we may be able to improve her mood and hot flashes and therefore improve her sleep indirectly. I sent a prescription for the extended release 71m daily. She knows she will need to be on this medicine for at least 2-4 weeks to notice any significant changes.   Jennifer Frazier return next week for labs and the start of cycle 5. I have reduced her IV premed steroid dose in the future as she has had no nausea. She understands and agrees with this plan. She knows the goal of treatment in her case is cure. She has been encouraged to call with any issues that might arise before her next visit here.  FMarcelino Duster NP   12/29/2013 1:11 PM

## 2014-01-05 ENCOUNTER — Ambulatory Visit (HOSPITAL_BASED_OUTPATIENT_CLINIC_OR_DEPARTMENT_OTHER): Payer: 59 | Admitting: Nurse Practitioner

## 2014-01-05 ENCOUNTER — Other Ambulatory Visit (HOSPITAL_BASED_OUTPATIENT_CLINIC_OR_DEPARTMENT_OTHER): Payer: 59

## 2014-01-05 ENCOUNTER — Encounter: Payer: Self-pay | Admitting: Oncology

## 2014-01-05 ENCOUNTER — Other Ambulatory Visit: Payer: Self-pay | Admitting: Hematology and Oncology

## 2014-01-05 ENCOUNTER — Ambulatory Visit (HOSPITAL_BASED_OUTPATIENT_CLINIC_OR_DEPARTMENT_OTHER): Payer: 59

## 2014-01-05 ENCOUNTER — Encounter: Payer: Self-pay | Admitting: Nurse Practitioner

## 2014-01-05 VITALS — BP 136/89 | HR 80 | Temp 98.2°F | Resp 19 | Ht 69.0 in | Wt 228.6 lb

## 2014-01-05 DIAGNOSIS — D0510 Intraductal carcinoma in situ of unspecified breast: Secondary | ICD-10-CM

## 2014-01-05 DIAGNOSIS — Z170 Estrogen receptor positive status [ER+]: Secondary | ICD-10-CM

## 2014-01-05 DIAGNOSIS — Z803 Family history of malignant neoplasm of breast: Secondary | ICD-10-CM

## 2014-01-05 DIAGNOSIS — C50411 Malignant neoplasm of upper-outer quadrant of right female breast: Secondary | ICD-10-CM

## 2014-01-05 DIAGNOSIS — Z5111 Encounter for antineoplastic chemotherapy: Secondary | ICD-10-CM

## 2014-01-05 DIAGNOSIS — Z800 Family history of malignant neoplasm of digestive organs: Secondary | ICD-10-CM

## 2014-01-05 LAB — COMPREHENSIVE METABOLIC PANEL (CC13)
ALT: 19 U/L (ref 0–55)
AST: 18 U/L (ref 5–34)
Albumin: 4 g/dL (ref 3.5–5.0)
Alkaline Phosphatase: 97 U/L (ref 40–150)
Anion Gap: 11 mEq/L (ref 3–11)
BUN: 15 mg/dL (ref 7.0–26.0)
CALCIUM: 10.2 mg/dL (ref 8.4–10.4)
CHLORIDE: 105 meq/L (ref 98–109)
CO2: 25 mEq/L (ref 22–29)
CREATININE: 0.8 mg/dL (ref 0.6–1.1)
EGFR: >90 mL/min/{1.73_m2} (ref >90)
Glucose: 85 mg/dl (ref 70–140)
Potassium: 4.1 mEq/L (ref 3.5–5.1)
Sodium: 140 mEq/L (ref 136–145)
Total Bilirubin: 0.57 mg/dL (ref 0.20–1.20)
Total Protein: 7.5 g/dL (ref 6.4–8.3)

## 2014-01-05 LAB — CBC WITH DIFFERENTIAL/PLATELET
BASO%: 0.5 % (ref 0.0–2.0)
BASOS ABS: 0 10*3/uL (ref 0.0–0.1)
EOS%: 2.3 % (ref 0.0–7.0)
Eosinophils Absolute: 0.1 10*3/uL (ref 0.0–0.5)
HEMATOCRIT: 36.8 % (ref 34.8–46.6)
HEMOGLOBIN: 12.2 g/dL (ref 11.6–15.9)
LYMPH#: 0.5 10*3/uL — AB (ref 0.9–3.3)
LYMPH%: 24.3 % (ref 14.0–49.7)
MCH: 26.9 pg (ref 25.1–34.0)
MCHC: 33.2 g/dL (ref 31.5–36.0)
MCV: 81.1 fL (ref 79.5–101.0)
MONO#: 0.1 10*3/uL (ref 0.1–0.9)
MONO%: 2.3 % (ref 0.0–14.0)
NEUT#: 1.5 10*3/uL (ref 1.5–6.5)
NEUT%: 70.6 % (ref 38.4–76.8)
PLATELETS: 192 10*3/uL (ref 145–400)
RBC: 4.54 10*6/uL (ref 3.70–5.45)
RDW: 16.8 % — ABNORMAL HIGH (ref 11.2–14.5)
WBC: 2.2 10*3/uL — ABNORMAL LOW (ref 3.9–10.3)

## 2014-01-05 MED ORDER — DIPHENHYDRAMINE HCL 50 MG/ML IJ SOLN
INTRAMUSCULAR | Status: AC
  Filled 2014-01-05: qty 1

## 2014-01-05 MED ORDER — DEXAMETHASONE SODIUM PHOSPHATE 20 MG/5ML IJ SOLN
12.0000 mg | Freq: Once | INTRAMUSCULAR | Status: AC
  Administered 2014-01-05: 12 mg via INTRAVENOUS

## 2014-01-05 MED ORDER — ONDANSETRON 8 MG/NS 50 ML IVPB
INTRAVENOUS | Status: AC
  Filled 2014-01-05: qty 8

## 2014-01-05 MED ORDER — PACLITAXEL CHEMO INJECTION 300 MG/50ML
80.0000 mg/m2 | Freq: Once | INTRAVENOUS | Status: AC
  Administered 2014-01-05: 180 mg via INTRAVENOUS
  Filled 2014-01-05: qty 30

## 2014-01-05 MED ORDER — SODIUM CHLORIDE 0.9 % IJ SOLN
10.0000 mL | INTRAMUSCULAR | Status: DC | PRN
  Administered 2014-01-05: 10 mL
  Filled 2014-01-05: qty 10

## 2014-01-05 MED ORDER — HEPARIN SOD (PORK) LOCK FLUSH 100 UNIT/ML IV SOLN
500.0000 [IU] | Freq: Once | INTRAVENOUS | Status: AC | PRN
  Administered 2014-01-05: 500 [IU]
  Filled 2014-01-05: qty 5

## 2014-01-05 MED ORDER — SODIUM CHLORIDE 0.9 % IV SOLN
Freq: Once | INTRAVENOUS | Status: AC
  Administered 2014-01-05: 13:00:00 via INTRAVENOUS

## 2014-01-05 MED ORDER — DEXAMETHASONE SODIUM PHOSPHATE 20 MG/5ML IJ SOLN
INTRAMUSCULAR | Status: AC
  Filled 2014-01-05: qty 5

## 2014-01-05 MED ORDER — FAMOTIDINE IN NACL 20-0.9 MG/50ML-% IV SOLN
INTRAVENOUS | Status: AC
  Filled 2014-01-05: qty 50

## 2014-01-05 MED ORDER — FAMOTIDINE IN NACL 20-0.9 MG/50ML-% IV SOLN
20.0000 mg | Freq: Once | INTRAVENOUS | Status: AC
  Administered 2014-01-05: 20 mg via INTRAVENOUS

## 2014-01-05 MED ORDER — ONDANSETRON 8 MG/50ML IVPB (CHCC)
8.0000 mg | Freq: Once | INTRAVENOUS | Status: AC
  Administered 2014-01-05: 8 mg via INTRAVENOUS

## 2014-01-05 MED ORDER — DIPHENHYDRAMINE HCL 50 MG/ML IJ SOLN
25.0000 mg | Freq: Once | INTRAMUSCULAR | Status: AC
  Administered 2014-01-05: 25 mg via INTRAVENOUS

## 2014-01-05 NOTE — Progress Notes (Signed)
Jennifer  [phone] Fax:(336) [phone]     ID: GALIA RAHM DOB: 1975-10-18  MR#: [id number]  GNO#:[id number]  Patient Care Team: Sharilyn Sites, Jennifer Frazier PCP - General (Family Medicine) Chauncey Cruel, Jennifer Frazier Consulting Physician (Oncology) Thea Silversmith, Jennifer Frazier Consulting Physician (Radiation Oncology) [name], Jennifer Frazier Consulting Physician (General Surgery)   CHIEF COMPLAINT: Estrogen receptor positive stage I breast cancer CURRENT TREATMENT: receiving adjuvant chemotherapy   BREAST CANCER HISTORY: From the original intake note:  The patient had screening mammography at [facility] showing calcifications in the right breast, and was referred to the breast Center 07/06/2013 for additional views. Right diagnostic mammography confirmed a group of amorphous calcifications in the upper outer right breast measuring 1.2 cm.  Biopsy of this area 07/17/2013 showed ([id number]) ductal carcinoma in situ, high-grade, estrogen receptor 100% positive, and progesterone receptor 96% positive, both with strong staining intensity.  The patient was then referred to surgery and on 07/25/2013 underwent bilateral breast MRI. This showed a breast density category CEA. In the right breast there was an area of clumped nodular enhancement measuring 3 cm. There was also a 1.5 cm circumscribed oval mass in the medial portion of the right breast consistent with a fibroadenoma. The left breast was unremarkable, and there were no abnormal appearing lymph nodes.  On 08/08/2013 the patient underwent right lumpectomy and sentinel lymph node sampling. The pathology ([id number] was (showed, in addition to ductal carcinoma in situ, invasive ductal carcinoma, measuring 0.7 cm, grade 3, estrogen receptor 86% positive, progesterone receptor 89% positive, both with strong staining intensity, with an MIB-1 of 62%, and no HER-2 amplification. Both sentinel lymph nodes were clear.  Margins were ample.   The patient's subsequent history is Frazier detailed below  INTERVAL HISTORY: [name] returns today for follow up of her breast cancer, accompanied by her mother. Today is day 1 cycle 5 of 12 planned cycles of paclitaxel. She is much improved this week. The venlafaxine cut her hot flashes out immediately and she is finding it easier to sleep. There were no acute events this week. She had some mild nausea, but these episodes quickly resolved.   REVIEW OF SYSTEMS: [name] denies fevers, chills, vomiting, or change in bladder habits. She has no mouth sores, rashes, or peripheral neuropathy symptoms. She denies shortness of breath, chest pain, cough, palpitations, or excessive fatigue. She has some mild tenderness over her port site. A detailed review of systems was otherwise negative.   PAST MEDICAL HISTORY: Past Medical History  Diagnosis Date  . Fibroids     uterine fibroids  . Anemia   . Thyroid disease   . Contact lens/glasses fitting     wears contacts or glasses  . Sleep apnea     has a cpap-does not always use it    PAST SURGICAL HISTORY: Past Surgical History  Procedure Laterality Date  . [place]  2012    urerine ablasion  . Eye surgery  2013    retinal tear-lazer-both  . Wisdom tooth extraction    . Breast surgery      lumpectomy 7/14  . Portacath placement Right 09/29/2013    Procedure: INSERTION PORT-A-CATH WITH ULTRA SOUND;  Surgeon: [name], Jennifer;  Location: [facility];  Service: General;  Laterality: Right;  . Port a cath revision N/A 12/19/2013    Procedure: REPOSITION PORT;  Surgeon: [name], Jennifer;  Location: [facility];  Service: General;  Laterality:  N/A;    FAMILY HISTORY Family History  Problem Relation Age of Onset  . Cancer Father 74    colon  . Cancer Maternal Aunt 50    breast cancer  . Cancer Paternal Aunt 75    breast cancer  . Cancer Maternal Grandmother 51    ovarian or uterine cancer  . Cancer  Paternal Grandmother     unknown primary   the patient's father died at the age of 55, been diagnosed with colon cancer at the age of 53. The patient's mother is living, currently 90 years old. The patient has one brother, no sisters. One of the patient's mother's sisters was diagnosed with breast cancer at the age of 19 in one of the patient's father's mother was diagnosed with breast cancer at the age of 62. There is no history of ovarian cancer in the family.  GYNECOLOGIC HISTORY:  Patient's last menstrual period was 12/01/2013. Menarche age 38, for the patient is GX P0. She still having regular periods. 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 She is single and her mother lives with her.(The patient's mother is wheelchair-bound secondary to a stroke).    ADVANCED DIRECTIVES: Not in place; at the 08/24/2013 visit the patient was given the appropriate documents to come feel notarize associate may declare healthcare power of attorney.   HEALTH MAINTENANCE: History  Substance Use Topics  . Smoking status: Never Smoker   . Smokeless tobacco: Not on file  . Alcohol Use: No     Colonoscopy:  PAP:  Bone density:  Lipid panel:  Allergies  Allergen Reactions  . Gadolinium      Desc: NAUSEA WITH MRI CONTRAST-NO VOMITING   . Tramadol Hives and Nausea Only    Current Outpatient Prescriptions  Medication Sig Dispense Refill  . Cholecalciferol (VITAMIN D3) 3000 UNITS TABS Take 10,000 Units by mouth daily.    [name] lidocaine-prilocaine (EMLA) cream Apply 1 application topically Frazier needed. 30 g 0  . thyroid (ARMOUR) 130 MG tablet Take 130 mg by mouth daily.    [name] venlafaxine XR (EFFEXOR-XR) 75 MG 24 hr capsule Take 1 capsule (75 mg total) by mouth daily with breakfast. 30 capsule 2  . vitamin B-12 (CYANOCOBALAMIN) 1000 MCG tablet Take 5,000 mcg by mouth daily.    [name]  gabapentin (NEURONTIN) 300 MG capsule Take 1 capsule (300 mg total) by mouth 3 (three) times daily. (Patient not taking: Reported on 12/22/2013) 30 capsule 3  . HYDROcodone-acetaminophen (NORCO) 5-325 MG per tablet Take 1 tablet by mouth every 6 (six) hours Frazier needed for moderate pain. (Patient not taking: Reported on 01/05/2014) 30 tablet 0  . ibuprofen (ADVIL,MOTRIN) 100 MG/5ML suspension Take 200 mg by mouth every 4 (four) hours Frazier needed.    . promethazine (PHENERGAN) 25 MG tablet Take 25 mg by mouth every 6 (six) hours Frazier needed for nausea or vomiting.    . traZODone (DESYREL) 50 MG tablet Take 1 tablet (50 mg total) by mouth at bedtime. (Patient not taking: Reported on 12/22/2013) 30 tablet 2   No current facility-administered medications for this visit.    OBJECTIVE: Middle-aged [place] American woman who appears stated age 38 Vitals:   01/05/14 1120  BP: 136/89  Pulse: 80  Temp: 98.2 F (36.8 C)  Resp: 19     Body mass index is 33.74 kg/(m^2).    ECOG FS:1 - Symptomatic but completely  ambulatory   Sclerae unicteric, pupils equal and reactive Oropharynx clear and moist-- no thrush No cervical or supraclavicular adenopathy Lungs no rales or rhonchi Heart regular rate and rhythm Abd soft, nontender, positive bowel sounds MSK no focal spinal tenderness, no upper extremity lymphedema Neuro: nonfocal, well oriented, appropriate affect Breasts: deferred  LAB RESULTS:  CMP     Component Value Date/Time   NA 140 01/05/2014 1106   NA 140 10/12/2013 1628   K 4.1 01/05/2014 1106   K 4.3 10/12/2013 1628   CL 104 10/12/2013 1628   CO2 25 01/05/2014 1106   CO2 26 10/12/2013 1628   GLUCOSE 85 01/05/2014 1106   GLUCOSE 89 10/12/2013 1628   BUN 15.0 01/05/2014 1106   BUN 15 10/12/2013 1628   CREATININE 0.8 01/05/2014 1106   CREATININE 0.74 10/12/2013 1628   CALCIUM 10.2 01/05/2014 1106   CALCIUM 9.6 10/12/2013 1628   PROT 7.5 01/05/2014 1106   PROT 6.7 10/12/2013 1628    ALBUMIN 4.0 01/05/2014 1106   ALBUMIN 3.3* 10/12/2013 1628   AST 18 01/05/2014 1106   AST 15 10/12/2013 1628   ALT 19 01/05/2014 1106   ALT 16 10/12/2013 1628   ALKPHOS 97 01/05/2014 1106   ALKPHOS 93 10/12/2013 1628   BILITOT 0.57 01/05/2014 1106   BILITOT 0.4 10/12/2013 1628   GFRNONAA >90 08/04/2013 1245   GFRAA >90 08/04/2013 1245    I No results found for: SPEP  Lab Results  Component Value Date   WBC 2.2* 01/05/2014   NEUTROABS 1.5 01/05/2014   HGB 12.2 01/05/2014   HCT 36.8 01/05/2014   MCV 81.1 01/05/2014   PLT 192 01/05/2014      Chemistry      Component Value Date/Time   NA 140 01/05/2014 1106   NA 140 10/12/2013 1628   K 4.1 01/05/2014 1106   K 4.3 10/12/2013 1628   CL 104 10/12/2013 1628   CO2 25 01/05/2014 1106   CO2 26 10/12/2013 1628   BUN 15.0 01/05/2014 1106   BUN 15 10/12/2013 1628   CREATININE 0.8 01/05/2014 1106   CREATININE 0.74 10/12/2013 1628      Component Value Date/Time   CALCIUM 10.2 01/05/2014 1106   CALCIUM 9.6 10/12/2013 1628   ALKPHOS 97 01/05/2014 1106   ALKPHOS 93 10/12/2013 1628   AST 18 01/05/2014 1106   AST 15 10/12/2013 1628   ALT 19 01/05/2014 1106   ALT 16 10/12/2013 1628   BILITOT 0.57 01/05/2014 1106   BILITOT 0.4 10/12/2013 1628       No results found for: LABCA2  No components found for: LABCA125  No results for input(s): INR in the last 168 hours.  Urinalysis    Component Value Date/Time   COLORURINE YELLOW 10/21/2009 1440   APPEARANCEUR CLEAR 10/21/2009 1440   LABSPEC 1.012 10/21/2009 1440   PHURINE 6.0 10/21/2009 1440   GLUCOSEU NEGATIVE 10/21/2009 1440   HGBUR NEGATIVE 10/21/2009 1440   BILIRUBINUR NEGATIVE 10/21/2009 1440   KETONESUR 15* 10/21/2009 1440   PROTEINUR NEGATIVE 10/21/2009 1440   UROBILINOGEN 0.2 10/21/2009 1440   NITRITE NEGATIVE 10/21/2009 1440   LEUKOCYTESUR  10/21/2009 1440    NEGATIVE MICROSCOPIC NOT DONE ON URINES WITH NEGATIVE PROTEIN, BLOOD, LEUKOCYTES, NITRITE, OR GLUCOSE  <1000 mg/dL.    STUDIES: No results found.  ASSESSMENT: 38 y.o. BRCA negative [place] woman status post right breast biopsy 07/17/2013 for high-grade ductal carcinoma in situ, estrogen and progesterone receptor positive  (1) status post right lumpectomy  and sentinel lymph node sampling 08/08/2013 for a pT1b pN0, stage IA invasive ductal carcinoma, grade 3, estrogen and progesterone receptor positive, with an MIB-1 of 62% and no HER-2 amplification  (2) genetics sent 08/02/2013 was normal but did identify a variant of uncertain significance called BRCA2, [id number].  (3) Oncotype score of 23 predicts a risk of outside the breast recurrence within 10 years of 15% if the patient's only systemic treatment is tamoxifen for 5 years. Adjuvant chemotherapy was recommended  (4) doxorubicin and cyclophosphamide in dose dense fashion x4 with Neulasta support to start 10/05/2013, to be followed by paclitaxel weekly x12  (5) adjuvant radiation to follow chemotherapy  (6) anti-estrogens for 5-10 years to follow radiation  (7) "flipped port:" The port head in Shevelle's anterior chest appeared unusually mobile. Port replaced on 12/19/13  PLAN: Matty feels great today now that she is able to get more rest at home. The labs were reviewed in detail and were entirely stable. She will proceed with cycle 5 of paclitaxel today.   Haylyn will return for labs and cycle 6 alone with no APP visit. I will be out of town. We discussed this and she will alert the infusion nurses of any questions she has. She left some claims forms to be faxed and filled out so that she may continue to receive car insurance support. She will pick up her copy next week. She understands and agrees with this plan. She knows the goal of treatment in her case is cure. She has been encouraged to call with any issues that might arise before her next visit here.   Marcelino Duster, NP   01/05/2014 11:48 AM

## 2014-01-05 NOTE — Progress Notes (Signed)
Put disability form on nurse's desk. °

## 2014-01-05 NOTE — Patient Instructions (Signed)
[facility] Discharge Instructions for Patients Receiving Chemotherapy  Today you received the following chemotherapy agents Taxotere.  To help prevent nausea and vomiting after your treatment, we encourage you to take your nausea medication.   If you develop nausea and vomiting that is not controlled by your nausea medication, call the clinic.   BELOW ARE SYMPTOMS THAT SHOULD BE REPORTED IMMEDIATELY:  *FEVER GREATER THAN 100.5 F  *CHILLS WITH OR WITHOUT FEVER  NAUSEA AND VOMITING THAT IS NOT CONTROLLED WITH YOUR NAUSEA MEDICATION  *UNUSUAL SHORTNESS OF BREATH  *UNUSUAL BRUISING OR BLEEDING  TENDERNESS IN MOUTH AND THROAT WITH OR WITHOUT PRESENCE OF ULCERS  *URINARY PROBLEMS  *BOWEL PROBLEMS  UNUSUAL RASH Items with * indicate a potential emergency and should be followed up as soon as possible.  Feel free to call the clinic you have any questions or concerns. The clinic phone number is [phone].    

## 2014-01-09 ENCOUNTER — Encounter: Payer: Self-pay | Admitting: Oncology

## 2014-01-09 NOTE — Progress Notes (Signed)
Put disability form in registration desk. °

## 2014-01-10 ENCOUNTER — Telehealth: Payer: Self-pay | Admitting: *Deleted

## 2014-01-10 ENCOUNTER — Other Ambulatory Visit: Payer: Self-pay | Admitting: *Deleted

## 2014-01-10 NOTE — Telephone Encounter (Signed)
This RN returned call to per her VM inquiring about form she left last week per disability.  This RN located form at registration desk- called pt and obtained identified VM.  Message left informing pt RN now has form at her desk and to call if she wants form faxed or if she will pick it up at next visit.

## 2014-01-12 ENCOUNTER — Other Ambulatory Visit: Payer: Self-pay | Admitting: Oncology

## 2014-01-12 ENCOUNTER — Other Ambulatory Visit (HOSPITAL_BASED_OUTPATIENT_CLINIC_OR_DEPARTMENT_OTHER): Payer: 59

## 2014-01-12 ENCOUNTER — Ambulatory Visit (HOSPITAL_BASED_OUTPATIENT_CLINIC_OR_DEPARTMENT_OTHER): Payer: 59

## 2014-01-12 DIAGNOSIS — D0510 Intraductal carcinoma in situ of unspecified breast: Secondary | ICD-10-CM

## 2014-01-12 DIAGNOSIS — Z800 Family history of malignant neoplasm of digestive organs: Secondary | ICD-10-CM

## 2014-01-12 DIAGNOSIS — C50411 Malignant neoplasm of upper-outer quadrant of right female breast: Secondary | ICD-10-CM

## 2014-01-12 DIAGNOSIS — Z803 Family history of malignant neoplasm of breast: Secondary | ICD-10-CM

## 2014-01-12 DIAGNOSIS — Z5111 Encounter for antineoplastic chemotherapy: Secondary | ICD-10-CM

## 2014-01-12 LAB — CBC WITH DIFFERENTIAL/PLATELET
BASO%: 0.5 % (ref 0.0–2.0)
BASOS ABS: 0 10*3/uL (ref 0.0–0.1)
EOS%: 1.1 % (ref 0.0–7.0)
Eosinophils Absolute: 0 10*3/uL (ref 0.0–0.5)
HEMATOCRIT: 37.9 % (ref 34.8–46.6)
HEMOGLOBIN: 12.1 g/dL (ref 11.6–15.9)
LYMPH%: 23.8 % (ref 14.0–49.7)
MCH: 26.6 pg (ref 25.1–34.0)
MCHC: 31.9 g/dL (ref 31.5–36.0)
MCV: 83.2 fL (ref 79.5–101.0)
MONO#: 0.1 10*3/uL (ref 0.1–0.9)
MONO%: 4.9 % (ref 0.0–14.0)
NEUT#: 1.6 10*3/uL (ref 1.5–6.5)
NEUT%: 69.7 % (ref 38.4–76.8)
PLATELETS: 214 10*3/uL (ref 145–400)
RBC: 4.56 10*6/uL (ref 3.70–5.45)
RDW: 19.3 % — ABNORMAL HIGH (ref 11.2–14.5)
WBC: 2.3 10*3/uL — ABNORMAL LOW (ref 3.9–10.3)
lymph#: 0.6 10*3/uL — ABNORMAL LOW (ref 0.9–3.3)

## 2014-01-12 MED ORDER — DEXAMETHASONE SODIUM PHOSPHATE 20 MG/5ML IJ SOLN
INTRAMUSCULAR | Status: AC
  Filled 2014-01-12: qty 5

## 2014-01-12 MED ORDER — PACLITAXEL CHEMO INJECTION 300 MG/50ML
80.0000 mg/m2 | Freq: Once | INTRAVENOUS | Status: AC
  Administered 2014-01-12: 180 mg via INTRAVENOUS
  Filled 2014-01-12: qty 30

## 2014-01-12 MED ORDER — FAMOTIDINE IN NACL 20-0.9 MG/50ML-% IV SOLN
INTRAVENOUS | Status: AC
  Filled 2014-01-12: qty 50

## 2014-01-12 MED ORDER — HEPARIN SOD (PORK) LOCK FLUSH 100 UNIT/ML IV SOLN
500.0000 [IU] | Freq: Once | INTRAVENOUS | Status: AC | PRN
  Administered 2014-01-12: 500 [IU]
  Filled 2014-01-12: qty 5

## 2014-01-12 MED ORDER — ONDANSETRON 8 MG/50ML IVPB (CHCC)
8.0000 mg | Freq: Once | INTRAVENOUS | Status: AC
  Administered 2014-01-12: 8 mg via INTRAVENOUS

## 2014-01-12 MED ORDER — DIPHENHYDRAMINE HCL 50 MG/ML IJ SOLN
INTRAMUSCULAR | Status: AC
  Filled 2014-01-12: qty 1

## 2014-01-12 MED ORDER — SODIUM CHLORIDE 0.9 % IJ SOLN
10.0000 mL | INTRAMUSCULAR | Status: DC | PRN
  Administered 2014-01-12: 10 mL
  Filled 2014-01-12: qty 10

## 2014-01-12 MED ORDER — DEXAMETHASONE SODIUM PHOSPHATE 20 MG/5ML IJ SOLN
12.0000 mg | Freq: Once | INTRAMUSCULAR | Status: AC
  Administered 2014-01-12: 12 mg via INTRAVENOUS

## 2014-01-12 MED ORDER — SODIUM CHLORIDE 0.9 % IV SOLN
Freq: Once | INTRAVENOUS | Status: AC
  Administered 2014-01-12: 13:00:00 via INTRAVENOUS

## 2014-01-12 MED ORDER — DIPHENHYDRAMINE HCL 50 MG/ML IJ SOLN
25.0000 mg | Freq: Once | INTRAMUSCULAR | Status: AC
  Administered 2014-01-12: 25 mg via INTRAVENOUS

## 2014-01-12 MED ORDER — ONDANSETRON 8 MG/NS 50 ML IVPB
INTRAVENOUS | Status: AC
  Filled 2014-01-12: qty 8

## 2014-01-12 MED ORDER — FAMOTIDINE IN NACL 20-0.9 MG/50ML-% IV SOLN
20.0000 mg | Freq: Once | INTRAVENOUS | Status: AC
  Administered 2014-01-12: 20 mg via INTRAVENOUS

## 2014-01-12 NOTE — Patient Instructions (Signed)
[facility] Discharge Instructions for Patients Receiving Chemotherapy  Today you received the following chemotherapy agents taxol  To help prevent nausea and vomiting after your treatment, we encourage you to take your nausea medication if needed.   If you develop nausea and vomiting that is not controlled by your nausea medication, call the clinic.   BELOW ARE SYMPTOMS THAT SHOULD BE REPORTED IMMEDIATELY:  *FEVER GREATER THAN 100.5 F  *CHILLS WITH OR WITHOUT FEVER  NAUSEA AND VOMITING THAT IS NOT CONTROLLED WITH YOUR NAUSEA MEDICATION  *UNUSUAL SHORTNESS OF BREATH  *UNUSUAL BRUISING OR BLEEDING  TENDERNESS IN MOUTH AND THROAT WITH OR WITHOUT PRESENCE OF ULCERS  *URINARY PROBLEMS  *BOWEL PROBLEMS  UNUSUAL RASH Items with * indicate a potential emergency and should be followed up as soon as possible.  Feel free to call the clinic you have any questions or concerns. The clinic phone number is [phone].

## 2014-01-18 ENCOUNTER — Ambulatory Visit: Payer: 59

## 2014-01-18 ENCOUNTER — Ambulatory Visit: Payer: 59 | Admitting: Nurse Practitioner

## 2014-01-18 ENCOUNTER — Encounter: Payer: Self-pay | Admitting: Nurse Practitioner

## 2014-01-18 ENCOUNTER — Ambulatory Visit (HOSPITAL_BASED_OUTPATIENT_CLINIC_OR_DEPARTMENT_OTHER): Payer: 59 | Admitting: Nurse Practitioner

## 2014-01-18 ENCOUNTER — Other Ambulatory Visit (HOSPITAL_BASED_OUTPATIENT_CLINIC_OR_DEPARTMENT_OTHER): Payer: 59

## 2014-01-18 ENCOUNTER — Other Ambulatory Visit: Payer: 59

## 2014-01-18 VITALS — BP 143/93 | HR 78 | Temp 98.3°F | Resp 20 | Ht 69.0 in | Wt 231.4 lb

## 2014-01-18 DIAGNOSIS — D701 Agranulocytosis secondary to cancer chemotherapy: Secondary | ICD-10-CM | POA: Insufficient documentation

## 2014-01-18 DIAGNOSIS — C50411 Malignant neoplasm of upper-outer quadrant of right female breast: Secondary | ICD-10-CM

## 2014-01-18 DIAGNOSIS — Z803 Family history of malignant neoplasm of breast: Secondary | ICD-10-CM

## 2014-01-18 DIAGNOSIS — D0510 Intraductal carcinoma in situ of unspecified breast: Secondary | ICD-10-CM

## 2014-01-18 DIAGNOSIS — Z170 Estrogen receptor positive status [ER+]: Secondary | ICD-10-CM

## 2014-01-18 DIAGNOSIS — Z800 Family history of malignant neoplasm of digestive organs: Secondary | ICD-10-CM

## 2014-01-18 DIAGNOSIS — T451X5A Adverse effect of antineoplastic and immunosuppressive drugs, initial encounter: Secondary | ICD-10-CM

## 2014-01-18 LAB — COMPREHENSIVE METABOLIC PANEL (CC13)
ALT: 20 U/L (ref 0–55)
AST: 19 U/L (ref 5–34)
Albumin: 3.8 g/dL (ref 3.5–5.0)
Alkaline Phosphatase: 93 U/L (ref 40–150)
Anion Gap: 9 mEq/L (ref 3–11)
BILIRUBIN TOTAL: 0.67 mg/dL (ref 0.20–1.20)
BUN: 14.5 mg/dL (ref 7.0–26.0)
CALCIUM: 9.7 mg/dL (ref 8.4–10.4)
CHLORIDE: 105 meq/L (ref 98–109)
CO2: 25 mEq/L (ref 22–29)
CREATININE: 0.8 mg/dL (ref 0.6–1.1)
GLUCOSE: 90 mg/dL (ref 70–140)
Potassium: 4.2 mEq/L (ref 3.5–5.1)
Sodium: 139 mEq/L (ref 136–145)
Total Protein: 7.1 g/dL (ref 6.4–8.3)

## 2014-01-18 LAB — CBC WITH DIFFERENTIAL/PLATELET
BASO%: 0.7 % (ref 0.0–2.0)
BASOS ABS: 0 10*3/uL (ref 0.0–0.1)
EOS ABS: 0 10*3/uL (ref 0.0–0.5)
EOS%: 1.3 % (ref 0.0–7.0)
HCT: 36.3 % (ref 34.8–46.6)
HEMOGLOBIN: 12 g/dL (ref 11.6–15.9)
LYMPH%: 31.4 % (ref 14.0–49.7)
MCH: 27.4 pg (ref 25.1–34.0)
MCHC: 33.1 g/dL (ref 31.5–36.0)
MCV: 82.9 fL (ref 79.5–101.0)
MONO#: 0.1 10*3/uL (ref 0.1–0.9)
MONO%: 5.2 % (ref 0.0–14.0)
NEUT%: 61.4 % (ref 38.4–76.8)
NEUTROS ABS: 0.9 10*3/uL — AB (ref 1.5–6.5)
PLATELETS: 171 10*3/uL (ref 145–400)
RBC: 4.38 10*6/uL (ref 3.70–5.45)
RDW: 16.9 % — ABNORMAL HIGH (ref 11.2–14.5)
WBC: 1.5 10*3/uL — ABNORMAL LOW (ref 3.9–10.3)
lymph#: 0.5 10*3/uL — ABNORMAL LOW (ref 0.9–3.3)
nRBC: 0 % (ref 0–0)

## 2014-01-18 LAB — TECHNOLOGIST REVIEW

## 2014-01-18 MED ORDER — VENLAFAXINE HCL ER 75 MG PO CP24: 75.0000 mg | ORAL_CAPSULE | Freq: Every day | ORAL | Status: DC

## 2014-01-18 NOTE — Progress Notes (Signed)
Jennifer Frazier  [phone] Fax:([phone]) [phone]     ID: Jennifer Frazier DOB: 02-Mar-1975  MR#: [id number]  TDD#:[id number]  Patient Care Team: Sharilyn Sites, MD as PCP - General (Family Medicine) Chauncey Cruel, MD as Consulting Physician (Oncology) Thea Silversmith, MD as Consulting Physician (Radiation Oncology) [name], MD as Consulting Physician (General Surgery)   CHIEF COMPLAINT: Estrogen receptor positive stage I breast cancer CURRENT TREATMENT: receiving adjuvant chemotherapy   BREAST CANCER HISTORY: From the original intake note:  The patient had screening mammography at [facility] showing calcifications in the right breast, and was referred to the breast Center 07/06/2013 for additional views. Right diagnostic mammography confirmed a group of amorphous calcifications in the upper outer right breast measuring 1.2 cm.  Biopsy of this area 07/17/2013 showed ([id number]) ductal carcinoma in situ, high-grade, estrogen receptor 100% positive, and progesterone receptor 96% positive, both with strong staining intensity.  The patient was then referred to surgery and on 07/25/2013 underwent bilateral breast MRI. This showed a breast density category CEA. In the right breast there was an area of clumped nodular enhancement measuring 3 cm. There was also a 1.5 cm circumscribed oval mass in the medial portion of the right breast consistent with a fibroadenoma. The left breast was unremarkable, and there were no abnormal appearing lymph nodes.  On 08/08/2013 the patient underwent right lumpectomy and sentinel lymph node sampling. The pathology ([id number] was (showed, in addition to ductal carcinoma in situ, invasive ductal carcinoma, measuring 0.7 cm, grade 3, estrogen receptor 86% positive, progesterone receptor 89% positive, both with strong staining intensity, with an MIB-1 of 62%, and no HER-2 amplification. Both sentinel lymph nodes were clear.  Margins were ample.   The patient's subsequent history is as detailed below  INTERVAL HISTORY: Jennifer Frazier returns today for follow up of her breast cancer, accompanied by her mother. Today is day 1 cycle 7 of 12 planned cycles of paclitaxel.    REVIEW OF SYSTEMS: Jennifer Frazier denies fevers, chills, vomiting, or change in bladder habits. Her appetite is fair as she tends to lose her sense of taste for up to 3 days after chemo. She has no mouth sores or peripheral neuropathy symptoms.  She is starting to breakout on her forehead and cheeks.She denies shortness of breath, chest pain, cough, palpitations, or excessive fatigue. She is sleeping better at night. A detailed review of systems was otherwise negative.   PAST MEDICAL HISTORY: Past Medical History  Diagnosis Date  . Fibroids     uterine fibroids  . Anemia   . Thyroid disease   . Contact lens/glasses fitting     wears contacts or glasses  . Sleep apnea     has a cpap-does not always use it    PAST SURGICAL HISTORY: Past Surgical History  Procedure Laterality Date  . [place]  2012    urerine ablasion  . Eye surgery  2013    retinal tear-lazer-both  . Wisdom tooth extraction    . Breast surgery      lumpectomy 7/14  . Portacath placement Right 09/29/2013    Procedure: INSERTION PORT-A-CATH WITH ULTRA SOUND;  Surgeon: [name], MD;  Location: [facility];  Service: General;  Laterality: Right;  . Port a cath revision N/A 12/19/2013    Procedure: REPOSITION PORT;  Surgeon: [name], MD;  Location: [facility];  Service: General;  Laterality: N/A;    FAMILY HISTORY Family History  Problem Relation Age  of Onset  . Cancer Father 32    colon  . Cancer Maternal Aunt 50    breast cancer  . Cancer Paternal Aunt 85    breast cancer  . Cancer Maternal Grandmother 9    ovarian or uterine cancer  . Cancer Paternal Grandmother     unknown primary   the patient's father died at the age of 29, been  diagnosed with colon cancer at the age of 41. The patient's mother is living, currently 48 years old. The patient has one brother, no sisters. One of the patient's mother's sisters was diagnosed with breast cancer at the age of 55 in one of the patient's father's mother was diagnosed with breast cancer at the age of 21. There is no history of ovarian cancer in the family.  GYNECOLOGIC HISTORY:  No LMP recorded. Menarche age 55, for the patient is GX P0. She still having regular periods. 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    ADVANCED DIRECTIVES: Not in place; at the 08/24/2013 visit the patient was given the appropriate documents to come feel notarize associate may declare healthcare power of attorney.   HEALTH MAINTENANCE: History  Substance Use Topics  . Smoking status: Never Smoker   . Smokeless tobacco: Not on file  . Alcohol Use: No     Colonoscopy:  PAP:  Bone density:  Lipid panel:  Allergies  Allergen Reactions  . Gadolinium      Desc: NAUSEA WITH MRI CONTRAST-NO VOMITING   . Tramadol Hives and Nausea Only    Current Outpatient Prescriptions  Medication Sig Dispense Refill  . Cholecalciferol (VITAMIN D3) 3000 UNITS TABS Take 10,000 Units by mouth daily.    [name] lidocaine-prilocaine (EMLA) cream Apply 1 application topically as needed. 30 g 0  . thyroid (ARMOUR) 130 MG tablet Take 130 mg by mouth daily.    [name] venlafaxine XR (EFFEXOR-XR) 75 MG 24 hr capsule Take 1 capsule (75 mg total) by mouth daily with breakfast. 90 capsule 3  . gabapentin (NEURONTIN) 300 MG capsule Take 1 capsule (300 mg total) by mouth 3 (three) times daily. (Patient not taking: Reported on 12/22/2013) 30 capsule 3  . HYDROcodone-acetaminophen (NORCO) 5-325 MG per  tablet Take 1 tablet by mouth every 6 (six) hours as needed for moderate pain. (Patient not taking: Reported on 01/05/2014) 30 tablet 0  . ibuprofen (ADVIL,MOTRIN) 100 MG/5ML suspension Take 200 mg by mouth every 4 (four) hours as needed.    . promethazine (PHENERGAN) 25 MG tablet Take 25 mg by mouth every 6 (six) hours as needed for nausea or vomiting.    . traZODone (DESYREL) 50 MG tablet Take 1 tablet (50 mg total) by mouth at bedtime. (Patient not taking: Reported on 12/22/2013) 30 tablet 2  . vitamin B-12 (CYANOCOBALAMIN) 1000 MCG tablet Take 5,000 mcg by mouth daily.     No current facility-administered medications for this visit.    OBJECTIVE: Middle-aged [place] American woman who appears stated age 38 Vitals:   01/18/14 0911  BP: 143/93  Pulse: 78  Temp: 98.3 F (36.8 C)  Resp: 20     Body mass index is 34.16 kg/(m^2).    ECOG FS:1 - Symptomatic but completely ambulatory   Skin: warm, dry, mild acneform rash to face HEENT: sclerae anicteric, conjunctivae  pink, oropharynx clear. No thrush or mucositis.  Lymph Nodes: No cervical or supraclavicular lymphadenopathy  Lungs: clear to auscultation bilaterally, no rales, wheezes, or rhonci  Heart: regular rate and rhythm  Abdomen: round, soft, non tender, positive bowel sounds  Musculoskeletal: No focal spinal tenderness, no peripheral edema  Neuro: non focal, well oriented, positive affect  Breasts: deferred  LAB RESULTS:  CMP     Component Value Date/Time   NA 139 01/18/2014 0847   NA 140 10/12/2013 1628   K 4.2 01/18/2014 0847   K 4.3 10/12/2013 1628   CL 104 10/12/2013 1628   CO2 25 01/18/2014 0847   CO2 26 10/12/2013 1628   GLUCOSE 90 01/18/2014 0847   GLUCOSE 89 10/12/2013 1628   BUN 14.5 01/18/2014 0847   BUN 15 10/12/2013 1628   CREATININE 0.8 01/18/2014 0847   CREATININE 0.74 10/12/2013 1628   CALCIUM 9.7 01/18/2014 0847   CALCIUM 9.6 10/12/2013 1628   PROT 7.1 01/18/2014 0847   PROT 6.7 10/12/2013 1628    ALBUMIN 3.8 01/18/2014 0847   ALBUMIN 3.3* 10/12/2013 1628   AST 19 01/18/2014 0847   AST 15 10/12/2013 1628   ALT 20 01/18/2014 0847   ALT 16 10/12/2013 1628   ALKPHOS 93 01/18/2014 0847   ALKPHOS 93 10/12/2013 1628   BILITOT 0.67 01/18/2014 0847   BILITOT 0.4 10/12/2013 1628   GFRNONAA >90 08/04/2013 1245   GFRAA >90 08/04/2013 1245    I No results found for: SPEP  Lab Results  Component Value Date   WBC 1.5* 01/18/2014   NEUTROABS 0.9* 01/18/2014   HGB 12.0 01/18/2014   HCT 36.3 01/18/2014   MCV 82.9 01/18/2014   PLT 171 01/18/2014      Chemistry      Component Value Date/Time   NA 139 01/18/2014 0847   NA 140 10/12/2013 1628   K 4.2 01/18/2014 0847   K 4.3 10/12/2013 1628   CL 104 10/12/2013 1628   CO2 25 01/18/2014 0847   CO2 26 10/12/2013 1628   BUN 14.5 01/18/2014 0847   BUN 15 10/12/2013 1628   CREATININE 0.8 01/18/2014 0847   CREATININE 0.74 10/12/2013 1628      Component Value Date/Time   CALCIUM 9.7 01/18/2014 0847   CALCIUM 9.6 10/12/2013 1628   ALKPHOS 93 01/18/2014 0847   ALKPHOS 93 10/12/2013 1628   AST 19 01/18/2014 0847   AST 15 10/12/2013 1628   ALT 20 01/18/2014 0847   ALT 16 10/12/2013 1628   BILITOT 0.67 01/18/2014 0847   BILITOT 0.4 10/12/2013 1628       No results found for: LABCA2  No components found for: LABCA125  No results for input(s): INR in the last 168 hours.  Urinalysis    Component Value Date/Time   COLORURINE YELLOW 10/21/2009 1440   APPEARANCEUR CLEAR 10/21/2009 1440   LABSPEC 1.012 10/21/2009 1440   PHURINE 6.0 10/21/2009 1440   GLUCOSEU NEGATIVE 10/21/2009 1440   HGBUR NEGATIVE 10/21/2009 1440   BILIRUBINUR NEGATIVE 10/21/2009 1440   KETONESUR 15* 10/21/2009 1440   PROTEINUR NEGATIVE 10/21/2009 1440   UROBILINOGEN 0.2 10/21/2009 1440   NITRITE NEGATIVE 10/21/2009 1440   LEUKOCYTESUR  10/21/2009 1440    NEGATIVE MICROSCOPIC NOT DONE ON URINES WITH NEGATIVE PROTEIN, BLOOD, LEUKOCYTES, NITRITE, OR  GLUCOSE <1000 mg/dL.    STUDIES: No results found.  ASSESSMENT: 38 y.o. BRCA negative [place] woman status post right breast biopsy 07/17/2013 for high-grade ductal carcinoma in situ, estrogen and progesterone  receptor positive  (1) status post right lumpectomy and sentinel lymph node sampling 08/08/2013 for a pT1b pN0, stage IA invasive ductal carcinoma, grade 3, estrogen and progesterone receptor positive, with an MIB-1 of 62% and no HER-2 amplification  (2) genetics sent 08/02/2013 was normal but did identify a variant of uncertain significance called BRCA2, [id number].  (3) Oncotype score of 23 predicts a risk of outside the breast recurrence within 10 years of 15% if the patient's only systemic treatment is tamoxifen for 5 years. Adjuvant chemotherapy was recommended  (4) doxorubicin and cyclophosphamide in dose dense fashion x4 with Neulasta support to start 10/05/2013, to be followed by paclitaxel weekly x12  (5) adjuvant radiation to follow chemotherapy  (6) anti-estrogens for 5-10 years to follow radiation  (7) "flipped port:" The port head in Jennifer Frazier's anterior chest appeared unusually mobile. Port replaced on 12/19/13  PLAN: Joliyah is doing well today. The labs were reviewed in detail, and unfortunately her [place] is down to 0.9. We are unable to treat her with cycle 7 of paclitaxel at this time. We will hold for one week and recheck counts at that time. I will consult with Dr. Jana Hakim regarding whether or not he would like a small dose reduction performed with her next cycle. Jennifer Frazier understands and agrees with this plan. She knows the goal of treatment in her case is cure. She has been encouraged to call with any issues that might arise before her next visit here.   Marcelino Duster, NP   01/18/2014 9:49 AM

## 2014-01-24 ENCOUNTER — Other Ambulatory Visit: Payer: Self-pay | Admitting: Oncology

## 2014-01-25 ENCOUNTER — Ambulatory Visit (HOSPITAL_BASED_OUTPATIENT_CLINIC_OR_DEPARTMENT_OTHER): Payer: 59

## 2014-01-25 ENCOUNTER — Other Ambulatory Visit (HOSPITAL_BASED_OUTPATIENT_CLINIC_OR_DEPARTMENT_OTHER): Payer: 59

## 2014-01-25 DIAGNOSIS — Z5111 Encounter for antineoplastic chemotherapy: Secondary | ICD-10-CM

## 2014-01-25 DIAGNOSIS — C50411 Malignant neoplasm of upper-outer quadrant of right female breast: Secondary | ICD-10-CM

## 2014-01-25 DIAGNOSIS — Z800 Family history of malignant neoplasm of digestive organs: Secondary | ICD-10-CM

## 2014-01-25 DIAGNOSIS — D0510 Intraductal carcinoma in situ of unspecified breast: Secondary | ICD-10-CM

## 2014-01-25 DIAGNOSIS — Z803 Family history of malignant neoplasm of breast: Secondary | ICD-10-CM

## 2014-01-25 LAB — CBC WITH DIFFERENTIAL/PLATELET
BASO%: 0.4 % (ref 0.0–2.0)
Basophils Absolute: 0 10e3/uL (ref 0.0–0.1)
EOS%: 1.3 % (ref 0.0–7.0)
Eosinophils Absolute: 0 10e3/uL (ref 0.0–0.5)
HCT: 39.7 % (ref 34.8–46.6)
HGB: 12.9 g/dL (ref 11.6–15.9)
LYMPH%: 23.4 % (ref 14.0–49.7)
MCH: 26.8 pg (ref 25.1–34.0)
MCHC: 32.5 g/dL (ref 31.5–36.0)
MCV: 82.5 fL (ref 79.5–101.0)
MONO#: 0.2 10e3/uL (ref 0.1–0.9)
MONO%: 8.5 % (ref 0.0–14.0)
NEUT#: 1.6 10e3/uL (ref 1.5–6.5)
NEUT%: 66.4 % (ref 38.4–76.8)
Platelets: 176 10e3/uL (ref 145–400)
RBC: 4.81 10e6/uL (ref 3.70–5.45)
RDW: 16.1 % — ABNORMAL HIGH (ref 11.2–14.5)
WBC: 2.4 10e3/uL — ABNORMAL LOW (ref 3.9–10.3)
lymph#: 0.6 10e3/uL — ABNORMAL LOW (ref 0.9–3.3)

## 2014-01-25 MED ORDER — DIPHENHYDRAMINE HCL 50 MG/ML IJ SOLN
INTRAMUSCULAR | Status: AC
  Filled 2014-01-25: qty 1

## 2014-01-25 MED ORDER — DEXAMETHASONE SODIUM PHOSPHATE 20 MG/5ML IJ SOLN
INTRAMUSCULAR | Status: AC
  Filled 2014-01-25: qty 5

## 2014-01-25 MED ORDER — HEPARIN SOD (PORK) LOCK FLUSH 100 UNIT/ML IV SOLN
500.0000 [IU] | Freq: Once | INTRAVENOUS | Status: AC | PRN
  Administered 2014-01-25: 500 [IU]
  Filled 2014-01-25: qty 5

## 2014-01-25 MED ORDER — FAMOTIDINE IN NACL 20-0.9 MG/50ML-% IV SOLN
INTRAVENOUS | Status: AC
  Filled 2014-01-25: qty 50

## 2014-01-25 MED ORDER — DEXAMETHASONE SODIUM PHOSPHATE 20 MG/5ML IJ SOLN
12.0000 mg | Freq: Once | INTRAMUSCULAR | Status: AC
  Administered 2014-01-25: 12 mg via INTRAVENOUS

## 2014-01-25 MED ORDER — DIPHENHYDRAMINE HCL 50 MG/ML IJ SOLN
25.0000 mg | Freq: Once | INTRAMUSCULAR | Status: AC
  Administered 2014-01-25: 25 mg via INTRAVENOUS

## 2014-01-25 MED ORDER — PACLITAXEL CHEMO INJECTION 300 MG/50ML
65.0000 mg/m2 | Freq: Once | INTRAVENOUS | Status: AC
  Administered 2014-01-25: 144 mg via INTRAVENOUS
  Filled 2014-01-25: qty 24

## 2014-01-25 MED ORDER — SODIUM CHLORIDE 0.9 % IJ SOLN
10.0000 mL | INTRAMUSCULAR | Status: DC | PRN
  Administered 2014-01-25: 10 mL
  Filled 2014-01-25: qty 10

## 2014-01-25 MED ORDER — SODIUM CHLORIDE 0.9 % IV SOLN
Freq: Once | INTRAVENOUS | Status: AC
  Administered 2014-01-25: 11:00:00 via INTRAVENOUS

## 2014-01-25 MED ORDER — ONDANSETRON 8 MG/50ML IVPB (CHCC)
8.0000 mg | Freq: Once | INTRAVENOUS | Status: AC
  Administered 2014-01-25: 8 mg via INTRAVENOUS

## 2014-01-25 MED ORDER — FAMOTIDINE IN NACL 20-0.9 MG/50ML-% IV SOLN
20.0000 mg | Freq: Once | INTRAVENOUS | Status: AC
  Administered 2014-01-25: 20 mg via INTRAVENOUS

## 2014-01-25 NOTE — Patient Instructions (Signed)
[facility] Discharge Instructions for Patients Receiving Chemotherapy  Today you received the following chemotherapy agents taxol  To help prevent nausea and vomiting after your treatment, we encourage you to take your nausea medication if needed.   If you develop nausea and vomiting that is not controlled by your nausea medication, call the clinic.   BELOW ARE SYMPTOMS THAT SHOULD BE REPORTED IMMEDIATELY:  *FEVER GREATER THAN 100.5 F  *CHILLS WITH OR WITHOUT FEVER  NAUSEA AND VOMITING THAT IS NOT CONTROLLED WITH YOUR NAUSEA MEDICATION  *UNUSUAL SHORTNESS OF BREATH  *UNUSUAL BRUISING OR BLEEDING  TENDERNESS IN MOUTH AND THROAT WITH OR WITHOUT PRESENCE OF ULCERS  *URINARY PROBLEMS  *BOWEL PROBLEMS  UNUSUAL RASH Items with * indicate a potential emergency and should be followed up as soon as possible.  Feel free to call the clinic you have any questions or concerns. The clinic phone number is [phone].

## 2014-01-25 NOTE — Progress Notes (Signed)
Ok for treatment today with WBC of 2.4 and ANC of 1.6 and CMET from12/24/15  VO given and read back from Dr. Jana Hakim.

## 2014-02-01 ENCOUNTER — Ambulatory Visit: Payer: 59

## 2014-02-01 ENCOUNTER — Other Ambulatory Visit (HOSPITAL_BASED_OUTPATIENT_CLINIC_OR_DEPARTMENT_OTHER): Payer: 59

## 2014-02-01 ENCOUNTER — Telehealth: Payer: Self-pay | Admitting: Oncology

## 2014-02-01 ENCOUNTER — Ambulatory Visit (HOSPITAL_BASED_OUTPATIENT_CLINIC_OR_DEPARTMENT_OTHER): Payer: 59 | Admitting: Nurse Practitioner

## 2014-02-01 VITALS — BP 162/102 | HR 84 | Temp 98.2°F | Resp 18 | Ht 69.0 in | Wt 233.7 lb

## 2014-02-01 DIAGNOSIS — C50411 Malignant neoplasm of upper-outer quadrant of right female breast: Secondary | ICD-10-CM

## 2014-02-01 DIAGNOSIS — F329 Major depressive disorder, single episode, unspecified: Secondary | ICD-10-CM

## 2014-02-01 DIAGNOSIS — F32A Depression, unspecified: Secondary | ICD-10-CM

## 2014-02-01 DIAGNOSIS — D0510 Intraductal carcinoma in situ of unspecified breast: Secondary | ICD-10-CM

## 2014-02-01 DIAGNOSIS — R232 Flushing: Secondary | ICD-10-CM

## 2014-02-01 DIAGNOSIS — Z800 Family history of malignant neoplasm of digestive organs: Secondary | ICD-10-CM

## 2014-02-01 DIAGNOSIS — I10 Essential (primary) hypertension: Secondary | ICD-10-CM

## 2014-02-01 DIAGNOSIS — G4700 Insomnia, unspecified: Secondary | ICD-10-CM

## 2014-02-01 DIAGNOSIS — Z803 Family history of malignant neoplasm of breast: Secondary | ICD-10-CM

## 2014-02-01 DIAGNOSIS — Z170 Estrogen receptor positive status [ER+]: Secondary | ICD-10-CM

## 2014-02-01 LAB — COMPREHENSIVE METABOLIC PANEL (CC13)
ALK PHOS: 109 U/L (ref 40–150)
ALT: 14 U/L (ref 0–55)
AST: 16 U/L (ref 5–34)
Albumin: 4 g/dL (ref 3.5–5.0)
Anion Gap: 8 mEq/L (ref 3–11)
BUN: 12.7 mg/dL (ref 7.0–26.0)
CO2: 26 meq/L (ref 22–29)
CREATININE: 0.8 mg/dL (ref 0.6–1.1)
Calcium: 10.1 mg/dL (ref 8.4–10.4)
Chloride: 105 mEq/L (ref 98–109)
EGFR: >90 mL/min/{1.73_m2} (ref >90)
Glucose: 94 mg/dl (ref 70–140)
POTASSIUM: 4 meq/L (ref 3.5–5.1)
Sodium: 140 mEq/L (ref 136–145)
Total Bilirubin: 0.58 mg/dL (ref 0.20–1.20)
Total Protein: 7.4 g/dL (ref 6.4–8.3)

## 2014-02-01 LAB — CBC WITH DIFFERENTIAL/PLATELET
BASO%: 0.3 % (ref 0.0–2.0)
BASOS ABS: 0 10*3/uL (ref 0.0–0.1)
EOS%: 2.2 % (ref 0.0–7.0)
Eosinophils Absolute: 0.1 10*3/uL (ref 0.0–0.5)
HEMATOCRIT: 38.6 % (ref 34.8–46.6)
HEMOGLOBIN: 12.6 g/dL (ref 11.6–15.9)
LYMPH%: 22.6 % (ref 14.0–49.7)
MCH: 26.8 pg (ref 25.1–34.0)
MCHC: 32.6 g/dL (ref 31.5–36.0)
MCV: 82 fL (ref 79.5–101.0)
MONO#: 0.2 10*3/uL (ref 0.1–0.9)
MONO%: 6.5 % (ref 0.0–14.0)
NEUT%: 68.4 % (ref 38.4–76.8)
NEUTROS ABS: 2.2 10*3/uL (ref 1.5–6.5)
Platelets: 208 10*3/uL (ref 145–400)
RBC: 4.71 10*6/uL (ref 3.70–5.45)
RDW: 15.5 % — ABNORMAL HIGH (ref 11.2–14.5)
WBC: 3.2 10*3/uL — ABNORMAL LOW (ref 3.9–10.3)
lymph#: 0.7 10*3/uL — ABNORMAL LOW (ref 0.9–3.3)

## 2014-02-01 NOTE — Progress Notes (Signed)
[place]  Telephone:(336) [phone] Fax:(336) [phone]     ID: Jennifer Frazier DOB: [birth year]  MR#: [id number]  XQJ#:[id number]  Patient Care Team: Sharilyn Sites, MD as PCP - General (Family Medicine) Chauncey Cruel, MD as Consulting Physician (Oncology) Thea Silversmith, MD as Consulting Physician (Radiation Oncology) [name], MD as Consulting Physician (General Surgery)   CHIEF COMPLAINT: Estrogen receptor positive stage I breast cancer CURRENT TREATMENT: receiving adjuvant chemotherapy   BREAST CANCER HISTORY: From the original intake note:  The patient had screening mammography at [facility] showing calcifications in the right breast, and was referred to the breast Center 07/06/2013 for additional views. Right diagnostic mammography confirmed a group of amorphous calcifications in the upper outer right breast measuring 1.2 cm.  Biopsy of this area 07/17/2013 showed ([id number]) ductal carcinoma in situ, high-grade, estrogen receptor 100% positive, and progesterone receptor 96% positive, both with strong staining intensity.  The patient was then referred to surgery and on 07/25/2013 underwent bilateral breast MRI. This showed a breast density category CEA. In the right breast there was an area of clumped nodular enhancement measuring 3 cm. There was also a 1.5 cm circumscribed oval mass in the medial portion of the right breast consistent with a fibroadenoma. The left breast was unremarkable, and there were no abnormal appearing lymph nodes.  On 08/08/2013 the patient underwent right lumpectomy and sentinel lymph node sampling. The pathology ([id number] was (showed, in addition to ductal carcinoma in situ, invasive ductal carcinoma, measuring 0.7 cm, grade 3, estrogen receptor 86% positive, progesterone receptor 89% positive, both with strong staining intensity, with an MIB-1 of 62%, and no HER-2 amplification. Both sentinel lymph nodes were clear.  Margins were ample.   The patient's subsequent history is as detailed below  INTERVAL HISTORY: Jennifer Frazier returns today for follow up of her breast cancer, accompanied by her mother. Today is day 1 cycle 8 of 12 planned cycles of paclitaxel. This week her hot flashes have returned and she is back to only sleeping for an hour at a time at night. She has new numbness to her thumb and forefingers as well as transient numbness to the soles of her feet. Her blood pressure has been elevated for the last month as well, and she believes it is due to the venlafaxine. She has been able to control her hypertension in the past with diet and exercise, but was also on a "water pill" for a brief period of time. Her mood is much improved on the venlafaxine, so she is hesitant to give it up.  REVIEW OF SYSTEMS: Jennifer Frazier denies fevers, chills, nausea, vomiting, or changes in bowel or bladder habits. Her appetite is fair and she is drinking well. She denies mouth sores or rashes. She has no headaches, dizziness, or vision changes. She denies shortness of breath, chest pain, cough, or palpitations. A detailed review of systems is otherwise negative.   PAST MEDICAL HISTORY: Past Medical History  Diagnosis Date  . Fibroids     uterine fibroids  . Anemia   . Thyroid disease   . Contact lens/glasses fitting     wears contacts or glasses  . Sleep apnea     has a cpap-does not always use it    PAST SURGICAL HISTORY: Past Surgical History  Procedure Laterality Date  . [place]  2012    urerine ablasion  . Eye surgery  2013    retinal tear-lazer-both  . Wisdom tooth extraction    .  Breast surgery      lumpectomy 7/14  . Portacath placement Right 09/29/2013    Procedure: INSERTION PORT-A-CATH WITH ULTRA SOUND;  Surgeon: [name], MD;  Location: [facility];  Service: General;  Laterality: Right;  . Port a cath revision N/A 12/19/2013    Procedure: REPOSITION PORT;  Surgeon: [name], MD;  Location:  [facility];  Service: General;  Laterality: N/A;    FAMILY HISTORY Family History  Problem Relation Age of Onset  . Cancer Father 39    colon  . Cancer Maternal Aunt 50    breast cancer  . Cancer Paternal Aunt 72    breast cancer  . Cancer Maternal Grandmother 33    ovarian or uterine cancer  . Cancer Paternal Grandmother     unknown primary   the patient's father died at the age of 65, been diagnosed with colon cancer at the age of 8. The patient's mother is living, currently 18 years old. The patient has one brother, no sisters. One of the patient's mother's sisters was diagnosed with breast cancer at the age of 62 in one of the patient's father's mother was diagnosed with breast cancer at the age of 68. There is no history of ovarian cancer in the family.  GYNECOLOGIC HISTORY:  No LMP recorded. Menarche age 79, for the patient is GX P0. She still having regular periods. 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    ADVANCED DIRECTIVES: Not in place; at the 08/24/2013 visit the patient was given the appropriate documents to come feel notarize associate may declare healthcare power of attorney.   HEALTH MAINTENANCE: History  Substance Use Topics  . Smoking status: Never Smoker   . Smokeless tobacco: Not on file  . Alcohol Use: No     Colonoscopy:  PAP:  Bone density:  Lipid panel:  Allergies  Allergen Reactions  . Gadolinium      Desc: NAUSEA WITH MRI CONTRAST-NO VOMITING   . Tramadol Hives and Nausea Only    Current Outpatient Prescriptions  Medication Sig Dispense Refill  . Cholecalciferol (VITAMIN D3) 3000 UNITS TABS Take 10,000 Units by mouth daily.    [name] lidocaine-prilocaine (EMLA) cream Apply 1 application  topically as needed. 30 g 0  . thyroid (ARMOUR) 130 MG tablet Take 130 mg by mouth daily.    [name] venlafaxine XR (EFFEXOR-XR) 75 MG 24 hr capsule Take 1 capsule (75 mg total) by mouth daily with breakfast. 90 capsule 3  . vitamin B-12 (CYANOCOBALAMIN) 1000 MCG tablet Take 5,000 mcg by mouth daily.    [name] gabapentin (NEURONTIN) 300 MG capsule Take 1 capsule (300 mg total) by mouth 3 (three) times daily. (Patient not taking: Reported on 12/22/2013) 30 capsule 3  . HYDROcodone-acetaminophen (NORCO) 5-325 MG per tablet Take 1 tablet by mouth every 6 (six) hours as needed for moderate pain. (Patient not taking: Reported on 01/05/2014) 30 tablet 0  . ibuprofen (ADVIL,MOTRIN) 100 MG/5ML suspension Take 200 mg by mouth every 4 (four) hours as needed.    . promethazine (PHENERGAN) 25 MG tablet Take 25 mg by mouth every 6 (six) hours as needed for nausea or vomiting.    . traZODone (DESYREL) 50 MG tablet Take 1 tablet (50 mg total) by mouth at bedtime. (Patient not  taking: Reported on 12/22/2013) 30 tablet 2   No current facility-administered medications for this visit.    OBJECTIVE: Middle-aged [place] American woman who appears stated age 39 Vitals:   02/01/14 1214  BP: 162/102  Pulse:   Temp:   Resp:      Body mass index is 34.5 kg/(m^2).    ECOG FS:1 - Symptomatic but completely ambulatory  Skin warm, dry, mild acneform rash to face Sclerae unicteric, pupils equal and reactive Oropharynx clear and moist-- no thrush No cervical or supraclavicular adenopathy Lungs no rales or rhonchi Heart regular rate and rhythm Abd soft, nontender, positive bowel sounds MSK no focal spinal tenderness, no upper extremity lymphedema Neuro: nonfocal, well oriented, appropriate affect Breasts: deferred  LAB RESULTS:  CMP     Component Value Date/Time   NA 140 02/01/2014 1113   NA 140 10/12/2013 1628   K 4.0 02/01/2014 1113   K 4.3 10/12/2013 1628   CL 104 10/12/2013 1628   CO2 26 02/01/2014 1113   CO2  26 10/12/2013 1628   GLUCOSE 94 02/01/2014 1113   GLUCOSE 89 10/12/2013 1628   BUN 12.7 02/01/2014 1113   BUN 15 10/12/2013 1628   CREATININE 0.8 02/01/2014 1113   CREATININE 0.74 10/12/2013 1628   CALCIUM 10.1 02/01/2014 1113   CALCIUM 9.6 10/12/2013 1628   PROT 7.4 02/01/2014 1113   PROT 6.7 10/12/2013 1628   ALBUMIN 4.0 02/01/2014 1113   ALBUMIN 3.3* 10/12/2013 1628   AST 16 02/01/2014 1113   AST 15 10/12/2013 1628   ALT 14 02/01/2014 1113   ALT 16 10/12/2013 1628   ALKPHOS 109 02/01/2014 1113   ALKPHOS 93 10/12/2013 1628   BILITOT 0.58 02/01/2014 1113   BILITOT 0.4 10/12/2013 1628   GFRNONAA >90 08/04/2013 1245   GFRAA >90 08/04/2013 1245    I No results found for: SPEP  Lab Results  Component Value Date   WBC 3.2* 02/01/2014   NEUTROABS 2.2 02/01/2014   HGB 12.6 02/01/2014   HCT 38.6 02/01/2014   MCV 82.0 02/01/2014   PLT 208 02/01/2014      Chemistry      Component Value Date/Time   NA 140 02/01/2014 1113   NA 140 10/12/2013 1628   K 4.0 02/01/2014 1113   K 4.3 10/12/2013 1628   CL 104 10/12/2013 1628   CO2 26 02/01/2014 1113   CO2 26 10/12/2013 1628   BUN 12.7 02/01/2014 1113   BUN 15 10/12/2013 1628   CREATININE 0.8 02/01/2014 1113   CREATININE 0.74 10/12/2013 1628      Component Value Date/Time   CALCIUM 10.1 02/01/2014 1113   CALCIUM 9.6 10/12/2013 1628   ALKPHOS 109 02/01/2014 1113   ALKPHOS 93 10/12/2013 1628   AST 16 02/01/2014 1113   AST 15 10/12/2013 1628   ALT 14 02/01/2014 1113   ALT 16 10/12/2013 1628   BILITOT 0.58 02/01/2014 1113   BILITOT 0.4 10/12/2013 1628       No results found for: LABCA2  No components found for: LABCA125  No results for input(s): INR in the last 168 hours.  Urinalysis    Component Value Date/Time   COLORURINE YELLOW 10/21/2009 1440   APPEARANCEUR CLEAR 10/21/2009 1440   LABSPEC 1.012 10/21/2009 1440   PHURINE 6.0 10/21/2009 1440   GLUCOSEU NEGATIVE 10/21/2009 1440   HGBUR NEGATIVE 10/21/2009  1440   BILIRUBINUR NEGATIVE 10/21/2009 1440   KETONESUR 15* 10/21/2009 1440   PROTEINUR NEGATIVE 10/21/2009 1440   UROBILINOGEN  0.2 10/21/2009 1440   NITRITE NEGATIVE 10/21/2009 1440   LEUKOCYTESUR  10/21/2009 1440    NEGATIVE MICROSCOPIC NOT DONE ON URINES WITH NEGATIVE PROTEIN, BLOOD, LEUKOCYTES, NITRITE, OR GLUCOSE <1000 mg/dL.    STUDIES: No results found.  ASSESSMENT: 39 y.o. BRCA negative [place] woman status post right breast biopsy 07/17/2013 for high-grade ductal carcinoma in situ, estrogen and progesterone receptor positive  (1) status post right lumpectomy and sentinel lymph node sampling 08/08/2013 for a pT1b pN0, stage IA invasive ductal carcinoma, grade 3, estrogen and progesterone receptor positive, with an MIB-1 of 62% and no HER-2 amplification  (2) genetics sent 08/02/2013 was normal but did identify a variant of uncertain significance called BRCA2, [id number].  (3) Oncotype score of 23 predicts a risk of outside the breast recurrence within 10 years of 15% if the patient's only systemic treatment is tamoxifen for 5 years. Adjuvant chemotherapy was recommended  (4) doxorubicin and cyclophosphamide in dose dense fashion x4 with Neulasta support to start 10/05/2013, to be followed by paclitaxel weekly x12  (5) adjuvant radiation to follow chemotherapy  (6) anti-estrogens for 5-10 years to follow radiation  (7) "flipped port:" The port head in Jennifer Frazier's anterior chest appeared unusually mobile. Port replaced on 12/19/13  PLAN: Blakley is doing moderately well today. The labs were reviewed in detail and were entirely stable. I consulted with Dr. Jana Hakim with the information about her new neuropathy symptoms and he suggested holding this week's infusion, to see if it would resolve any.   Her blood pressure has been consistently elevated, but from unclear etiology. She has not been sleeping well or paying particular attention to her diet. However, this elevation began with  the start of venlafaxine with can raise the SBP in a number of patients. Jennifer Frazier is reluctant to come off of the venlafaxine because it has produced outstanding results. I suggested we put her back on HCTZ and she also declined, stating she preferred to work on this with diet and exercise. She has a blood pressure cuff at home and I advised she record her pressure BID and bring this log in with her next week. For her hot flashes and insomnia, I suggested she restart the gabapentin nightly.   Jennifer Frazier will return next week for labs and a follow up visit. She understands and agrees with this plan. She knows the goal of treatment in her case is cure. She has been encouraged to call with any issues that might arise before her next visit here.   Marcelino Duster, NP   02/01/2014 3:19 PM

## 2014-02-01 NOTE — Telephone Encounter (Signed)
per pof to sch pt appt-sent MW email to sch pt appt-will call pt once reply

## 2014-02-02 ENCOUNTER — Other Ambulatory Visit: Payer: Self-pay

## 2014-02-02 ENCOUNTER — Encounter (HOSPITAL_COMMUNITY): Payer: Self-pay | Admitting: Emergency Medicine

## 2014-02-02 ENCOUNTER — Encounter: Payer: Self-pay | Admitting: Nurse Practitioner

## 2014-02-02 ENCOUNTER — Emergency Department (HOSPITAL_COMMUNITY)
Admission: EM | Admit: 2014-02-02 | Discharge: 2014-02-02 | Disposition: A | Discharge status: Home / Self Care | Payer: 59 | Attending: Emergency Medicine | Admitting: Emergency Medicine

## 2014-02-02 DIAGNOSIS — Z8669 Personal history of other diseases of the nervous system and sense organs: Secondary | ICD-10-CM | POA: Diagnosis not present

## 2014-02-02 DIAGNOSIS — Z862 Personal history of diseases of the blood and blood-forming organs and certain disorders involving the immune mechanism: Secondary | ICD-10-CM | POA: Diagnosis not present

## 2014-02-02 DIAGNOSIS — Z853 Personal history of malignant neoplasm of breast: Secondary | ICD-10-CM | POA: Diagnosis not present

## 2014-02-02 DIAGNOSIS — I10 Essential (primary) hypertension: Secondary | ICD-10-CM | POA: Diagnosis present

## 2014-02-02 DIAGNOSIS — I159 Secondary hypertension, unspecified: Secondary | ICD-10-CM

## 2014-02-02 DIAGNOSIS — E039 Hypothyroidism, unspecified: Secondary | ICD-10-CM | POA: Insufficient documentation

## 2014-02-02 DIAGNOSIS — Z87442 Personal history of urinary calculi: Secondary | ICD-10-CM | POA: Insufficient documentation

## 2014-02-02 DIAGNOSIS — Z79899 Other long term (current) drug therapy: Secondary | ICD-10-CM | POA: Insufficient documentation

## 2014-02-02 DIAGNOSIS — R51 Headache: Secondary | ICD-10-CM | POA: Diagnosis not present

## 2014-02-02 DIAGNOSIS — R519 Headache, unspecified: Secondary | ICD-10-CM

## 2014-02-02 HISTORY — DX: Malignant (primary) neoplasm, unspecified: C80.1

## 2014-02-02 LAB — CBC WITH DIFFERENTIAL/PLATELET
Basophils Absolute: 0 10*3/uL (ref 0.0–0.1)
Basophils Relative: 0 % (ref 0–1)
Eosinophils Absolute: 0.1 10*3/uL (ref 0.0–0.7)
Eosinophils Relative: 2 % (ref 0–5)
HEMATOCRIT: 38.3 % (ref 36.0–46.0)
Hemoglobin: 12.7 g/dL (ref 12.0–15.0)
LYMPHS ABS: 0.9 10*3/uL (ref 0.7–4.0)
LYMPHS PCT: 24 % (ref 12–46)
MCH: 27.2 pg (ref 26.0–34.0)
MCHC: 33.2 g/dL (ref 30.0–36.0)
MCV: 82 fL (ref 78.0–100.0)
Monocytes Absolute: 0.2 10*3/uL (ref 0.1–1.0)
Monocytes Relative: 5 % (ref 3–12)
NEUTROS ABS: 2.4 10*3/uL (ref 1.7–7.7)
NEUTROS PCT: 68 % (ref 43–77)
Platelets: 226 10*3/uL (ref 150–400)
RBC: 4.67 MIL/uL (ref 3.87–5.11)
RDW: 15.5 % (ref 11.5–15.5)
WBC: 3.5 10*3/uL — AB (ref 4.0–10.5)

## 2014-02-02 LAB — BASIC METABOLIC PANEL
Anion gap: 6 (ref 5–15)
BUN: 17 mg/dL (ref 6–23)
CALCIUM: 9.4 mg/dL (ref 8.4–10.5)
CO2: 24 mmol/L (ref 19–32)
CREATININE: 0.68 mg/dL (ref 0.50–1.10)
Chloride: 101 mEq/L (ref 96–112)
GFR calc Af Amer: >90 mL/min (ref >90)
GFR calc non Af Amer: >90 mL/min (ref >90)
GLUCOSE: 93 mg/dL (ref 70–99)
POTASSIUM: 4.2 mmol/L (ref 3.5–5.1)
SODIUM: 131 mmol/L — AB (ref 135–145)

## 2014-02-02 LAB — URINALYSIS, ROUTINE W REFLEX MICROSCOPIC
Bilirubin Urine: NEGATIVE
Glucose, UA: NEGATIVE mg/dL
Hgb urine dipstick: NEGATIVE
Ketones, ur: NEGATIVE mg/dL
NITRITE: NEGATIVE
Protein, ur: NEGATIVE mg/dL
Specific Gravity, Urine: 1.009 (ref 1.005–1.030)
Urobilinogen, UA: 0.2 mg/dL (ref 0.0–1.0)
pH: 6 (ref 5.0–8.0)

## 2014-02-02 LAB — URINE MICROSCOPIC-ADD ON

## 2014-02-02 MED ORDER — HYDROCHLOROTHIAZIDE 25 MG PO TABS: 25.0000 mg | ORAL_TABLET | Freq: Every day | ORAL | Status: DC

## 2014-02-02 MED ORDER — KETOROLAC TROMETHAMINE 60 MG/2ML IM SOLN
30.0000 mg | Freq: Once | INTRAMUSCULAR | Status: AC
  Administered 2014-02-02: 30 mg via INTRAMUSCULAR
  Filled 2014-02-02: qty 2

## 2014-02-02 MED ORDER — CLONIDINE HCL 0.1 MG PO TABS
0.1000 mg | ORAL_TABLET | Freq: Once | ORAL | Status: AC
  Administered 2014-02-02: 0.1 mg via ORAL
  Filled 2014-02-02: qty 1

## 2014-02-02 NOTE — ED Notes (Signed)
Pt requested to have blood drawn from hand, not from port.

## 2014-02-02 NOTE — ED Notes (Signed)
Pt instructed to remain in room until 1025, 15 minutes after receiving IM injection.

## 2014-02-02 NOTE — Discharge Instructions (Signed)
Hypertension °Hypertension, commonly called high blood pressure, is when the force of blood pumping through your arteries is too strong. Your arteries are the blood vessels that carry blood from your heart throughout your body. A blood pressure reading consists of a higher number over a lower number, such as 110/72. The higher number (systolic) is the pressure inside your arteries when your heart pumps. The lower number (diastolic) is the pressure inside your arteries when your heart relaxes. Ideally you want your blood pressure below 120/80. °Hypertension forces your heart to work harder to pump blood. Your arteries may become narrow or stiff. Having hypertension puts you at risk for heart disease, stroke, and other problems.  °RISK FACTORS °Some risk factors for high blood pressure are controllable. Others are not.  °Risk factors you cannot control include:  °· Race. You may be at higher risk if you are African American. °· Age. Risk increases with age. °· Gender. Men are at higher risk than women before age 45 years. After age 65, women are at higher risk than men. °Risk factors you can control include: °· Not getting enough exercise or physical activity. °· Being overweight. °· Getting too much fat, sugar, calories, or salt in your diet. °· Drinking too much alcohol. °SIGNS AND SYMPTOMS °Hypertension does not usually cause signs or symptoms. Extremely high blood pressure (hypertensive crisis) may cause headache, anxiety, shortness of breath, and nosebleed. °DIAGNOSIS  °To check if you have hypertension, your health care provider will measure your blood pressure while you are seated, with your arm held at the level of your heart. It should be measured at least twice using the same arm. Certain conditions can cause a difference in blood pressure between your right and left arms. A blood pressure reading that is higher than normal on one occasion does not mean that you need treatment. If one blood pressure reading  is high, ask your health care provider about having it checked again. °TREATMENT  °Treating high blood pressure includes making lifestyle changes and possibly taking medicine. Living a healthy lifestyle can help lower high blood pressure. You may need to change some of your habits. °Lifestyle changes may include: °· Following the DASH diet. This diet is high in fruits, vegetables, and whole grains. It is low in salt, red meat, and added sugars. °· Getting at least 2½ hours of brisk physical activity every week. °· Losing weight if necessary. °· Not smoking. °· Limiting alcoholic beverages. °· Learning ways to reduce stress. ° If lifestyle changes are not enough to get your blood pressure under control, your health care provider may prescribe medicine. You may need to take more than one. Work closely with your health care provider to understand the risks and benefits. °HOME CARE INSTRUCTIONS °· Have your blood pressure rechecked as directed by your health care provider.   °· Take medicines only as directed by your health care provider. Follow the directions carefully. Blood pressure medicines must be taken as prescribed. The medicine does not work as well when you skip doses. Skipping doses also puts you at risk for problems.   °· Do not smoke.   °· Monitor your blood pressure at home as directed by your health care provider.  °SEEK MEDICAL CARE IF:  °· You think you are having a reaction to medicines taken. °· You have recurrent headaches or feel dizzy. °· You have swelling in your ankles. °· You have trouble with your vision. °SEEK IMMEDIATE MEDICAL CARE IF: °· You develop a severe headache or confusion. °·   You have unusual weakness, numbness, or feel faint. °· You have severe chest or abdominal pain. °· You vomit repeatedly. °· You have trouble breathing. °MAKE SURE YOU:  °· Understand these instructions. °· Will watch your condition. °· Will get help right away if you are not doing well or get worse. °Document  Released: 01/12/2005 Document Revised: 05/29/2013 Document Reviewed: 11/04/2012 °ExitCare® Patient Information ©2015 [employer]. This information is not intended to replace advice given to you by your health care provider. Make sure you discuss any questions you have with your health care provider. °General Headache Without Cause °A headache is pain or discomfort felt around the head or neck area. The specific cause of a headache may not be found. There are many causes and types of headaches. A few common ones are: °· Tension headaches. °· Migraine headaches. °· Cluster headaches. °· Chronic daily headaches. °HOME CARE INSTRUCTIONS  °· Keep all follow-up appointments with your caregiver or any specialist referral. °· Only take over-the-counter or prescription medicines for pain or discomfort as directed by your caregiver. °· Lie down in a dark, quiet room when you have a headache. °· Keep a headache journal to find out what may trigger your migraine headaches. For example, write down: °· What you eat and drink. °· How much sleep you get. °· Any change to your diet or medicines. °· Try massage or other relaxation techniques. °· Put ice packs or heat on the head and neck. Use these 3 to 4 times per day for 15 to 20 minutes each time, or as needed. °· Limit stress. °· Sit up straight, and do not tense your muscles. °· Quit smoking if you smoke. °· Limit alcohol use. °· Decrease the amount of caffeine you drink, or stop drinking caffeine. °· Eat and sleep on a regular schedule. °· Get 7 to 9 hours of sleep, or as recommended by your caregiver. °· Keep lights dim if bright lights bother you and make your headaches worse. °SEEK MEDICAL CARE IF:  °· You have problems with the medicines you were prescribed. °· Your medicines are not working. °· You have a change from the usual headache. °· You have nausea or vomiting. °SEEK IMMEDIATE MEDICAL CARE IF:  °· Your headache becomes severe. °· You have a fever. °· You have a  stiff neck. °· You have loss of vision. °· You have muscular weakness or loss of muscle control. °· You start losing your balance or have trouble walking. °· You feel faint or pass out. °· You have severe symptoms that are different from your first symptoms. °MAKE SURE YOU:  °· Understand these instructions. °· Will watch your condition. °· Will get help right away if you are not doing well or get worse. °Document Released: 01/12/2005 Document Revised: 04/06/2011 Document Reviewed: 01/28/2011 °ExitCare® Patient Information ©2015 [employer]. This information is not intended to replace advice given to you by your health care provider. Make sure you discuss any questions you have with your health care provider. ° °

## 2014-02-02 NOTE — ED Notes (Signed)
[facility]. Pt has had elevated BP at home and concerned that it is too. Pt c/o of a headache and took Tylenol at home pain 4/10. Pt ambulatory and in NAD.

## 2014-02-02 NOTE — ED Provider Notes (Signed)
CSN: [id number]     Arrival date & time 02/02/14  0758 History   First MD Initiated Contact with Patient 02/02/14 [phone]     Chief Complaint  Patient presents with  . Hypertension     (Consider location/radiation/quality/duration/timing/severity/associated sxs/prior Treatment) Patient is a 39 y.o. female presenting with headaches.  Headache Pain location:  Generalized Quality:  Dull Radiates to:  Does not radiate Severity currently:  3/10 Onset quality:  Gradual Duration:  1 day Timing:  Constant Progression:  Unchanged Chronicity:  Recurrent Similar to prior headaches: yes   Context comment:  Started on venlafaxine Relieved by:  Nothing Worsened by:  Nothing tried Ineffective treatments:  None tried Associated symptoms: no cough, no diarrhea, no fever, no nausea, no near-syncope, no numbness, no paresthesias, no sinus pressure, no syncope and no vomiting   Associated symptoms comment:  Breast ca, prior ho HTN with identical symptoms   Past Medical History  Diagnosis Date  . Fibroids     uterine fibroids  . Anemia   . Thyroid disease   . Contact lens/glasses fitting     wears contacts or glasses  . Sleep apnea     has a cpap-does not always use it  . Cancer     Breast   Past Surgical History  Procedure Laterality Date  . [place]  2012    urerine ablasion  . Eye surgery  2013    retinal tear-lazer-both  . Wisdom tooth extraction    . Breast surgery      lumpectomy 7/14  . Portacath placement Right 09/29/2013    Procedure: INSERTION PORT-A-CATH WITH ULTRA SOUND;  Surgeon: [name], MD;  Location: [facility];  Service: General;  Laterality: Right;  . Port a cath revision N/A 12/19/2013    Procedure: REPOSITION PORT;  Surgeon: [name], MD;  Location: [place];  Service: General;  Laterality: N/A;   Family History  Problem Relation Age of Onset  . Cancer Father 12    colon  . Cancer Maternal Aunt 50    breast cancer  .  Cancer Paternal Aunt 65    breast cancer  . Cancer Maternal Grandmother 17    ovarian or uterine cancer  . Cancer Paternal Grandmother     unknown primary   History  Substance Use Topics  . Smoking status: Never Smoker   . Smokeless tobacco: Not on file  . Alcohol Use: No   OB History    Obstetric Comments   Menses age 21 or 89 Last menstrual period 08/07/13 No children, no pregnancies     Review of Systems  Constitutional: Negative for fever.  HENT: Negative for sinus pressure.   Respiratory: Negative for cough.   Cardiovascular: Negative for syncope and near-syncope.  Gastrointestinal: Negative for nausea, vomiting and diarrhea.  Neurological: Positive for headaches. Negative for numbness and paresthesias.      Allergies  Gadolinium and Tramadol  Home Medications   Prior to Admission medications   Medication Sig Start Date End Date Taking? Authorizing Provider  Cholecalciferol (VITAMIN D3) 3000 UNITS TABS Take 10,000 Units by mouth daily.   Yes Historical Provider, MD  diphenhydramine-acetaminophen (TYLENOL PM) 25-500 MG TABS Take 1 tablet by mouth at bedtime as needed (pain/headache).   Yes Historical Provider, MD  gabapentin (NEURONTIN) 300 MG capsule Take 1 capsule (300 mg total) by mouth 3 (three) times daily. Patient taking differently: Take 300 mg by mouth 3 (three) times daily as needed (for pain).  12/07/13  Yes Marcelino Duster, NP  HYDROcodone-acetaminophen (NORCO) 5-325 MG per tablet Take 1 tablet by mouth every 6 (six) hours as needed for moderate pain. 12/19/13  Yes [name], MD  ibuprofen (ADVIL,MOTRIN) 100 MG/5ML suspension Take 200 mg by mouth every 4 (four) hours as needed.   Yes Historical Provider, MD  lidocaine-prilocaine (EMLA) cream Apply 1 application topically as needed. 10/05/13  Yes Chauncey Cruel, MD  promethazine (PHENERGAN) 25 MG tablet Take 25 mg by mouth every 6 (six) hours as needed for nausea or vomiting.   Yes Historical  Provider, MD  thyroid (ARMOUR) 130 MG tablet Take 130 mg by mouth daily.   Yes Historical Provider, MD  venlafaxine XR (EFFEXOR-XR) 75 MG 24 hr capsule Take 1 capsule (75 mg total) by mouth daily with breakfast. 01/18/14  Yes Marcelino Duster, NP  vitamin B-12 (CYANOCOBALAMIN) 1000 MCG tablet Take 5,000 mcg by mouth daily.   Yes Historical Provider, MD  hydrochlorothiazide (HYDRODIURIL) 25 MG tablet Take 1 tablet (25 mg total) by mouth daily. 02/02/14   Debby Freiberg, MD  traZODone (DESYREL) 50 MG tablet Take 1 tablet (50 mg total) by mouth at bedtime. Patient not taking: Reported on 12/22/2013 11/16/13   Marcelino Duster, NP   BP 163/92 mmHg  Pulse 102  Temp(Src) 98 F (36.7 C) (Oral)  Resp 16  SpO2 100%  LMP 11/08/2013 Physical Exam  Constitutional: She is oriented to person, place, and time. She appears well-developed and well-nourished.  HENT:  Head: Normocephalic and atraumatic.  Right Ear: External ear normal.  Left Ear: External ear normal.  Eyes: Conjunctivae and EOM are normal. Pupils are equal, round, and reactive to light.  Neck: Normal range of motion. Neck supple.  Cardiovascular: Normal rate, regular rhythm, normal heart sounds and intact distal pulses.   Pulmonary/Chest: Effort normal and breath sounds normal.  Abdominal: Soft. Bowel sounds are normal. There is no tenderness.  Musculoskeletal: Normal range of motion.  Neurological: She is alert and oriented to person, place, and time. She has normal strength and normal reflexes. No cranial nerve deficit or sensory deficit. Gait normal. GCS eye subscore is 4. GCS verbal subscore is 5. GCS motor subscore is 6.  Skin: Skin is warm and dry.  Vitals reviewed.   ED Course  Procedures (including critical care time) Labs Review Labs Reviewed  BASIC METABOLIC PANEL - Abnormal; Notable for the following:    Sodium 131 (*)    All other components within normal limits  URINALYSIS, ROUTINE W REFLEX MICROSCOPIC - Abnormal;  Notable for the following:    Leukocytes, UA TRACE (*)    All other components within normal limits  CBC WITH DIFFERENTIAL - Abnormal; Notable for the following:    WBC 3.5 (*)    All other components within normal limits  URINE MICROSCOPIC-ADD ON - Abnormal; Notable for the following:    Squamous Epithelial / LPF FEW (*)    All other components within normal limits  CBC WITH DIFFERENTIAL    Imaging Review No results found.   EKG Interpretation None      MDM   Final diagnoses:  Acute nonintractable headache, unspecified headache type  Secondary hypertension, unspecified    39 y.o. female with pertinent PMH of breast ca, depression recently started on effexor presents with headache as above.  Headache mild in nature and patient states it is recurrent with prior hypertensive episodes. In the past she was able to control her hypertension with diet and  exercise. She states that she thinks the reason her blood pressure size focus of recent start of Effexor.  On arrival vital signs and physical exam as above. No focal neurodeficits. Blood pressure mildly elevated. Baseline labs checked and unremarkable with exception of mildly decreased wbc count.  Will start patient HCTZ. Discharged home in stable condition with standard return precautions.    I have reviewed all laboratory and imaging studies if ordered as above  1. Acute nonintractable headache, unspecified headache type   2. Secondary hypertension, unspecified         Debby Freiberg, MD 02/02/14 1001

## 2014-02-05 ENCOUNTER — Encounter: Payer: Self-pay | Admitting: Oncology

## 2014-02-05 NOTE — Progress Notes (Signed)
Faxed disability form to [employer] @ [id number]

## 2014-02-08 ENCOUNTER — Encounter: Payer: Self-pay | Admitting: Nurse Practitioner

## 2014-02-08 ENCOUNTER — Ambulatory Visit (HOSPITAL_BASED_OUTPATIENT_CLINIC_OR_DEPARTMENT_OTHER): Payer: 59

## 2014-02-08 ENCOUNTER — Telehealth: Payer: Self-pay | Admitting: Oncology

## 2014-02-08 ENCOUNTER — Telehealth: Payer: Self-pay | Admitting: *Deleted

## 2014-02-08 ENCOUNTER — Ambulatory Visit (HOSPITAL_BASED_OUTPATIENT_CLINIC_OR_DEPARTMENT_OTHER): Payer: 59 | Admitting: Nurse Practitioner

## 2014-02-08 ENCOUNTER — Other Ambulatory Visit (HOSPITAL_BASED_OUTPATIENT_CLINIC_OR_DEPARTMENT_OTHER): Payer: 59

## 2014-02-08 VITALS — BP 139/97 | HR 93 | Temp 98.0°F | Resp 19 | Ht 69.0 in | Wt 229.1 lb

## 2014-02-08 DIAGNOSIS — Z803 Family history of malignant neoplasm of breast: Secondary | ICD-10-CM

## 2014-02-08 DIAGNOSIS — C50411 Malignant neoplasm of upper-outer quadrant of right female breast: Secondary | ICD-10-CM

## 2014-02-08 DIAGNOSIS — I10 Essential (primary) hypertension: Secondary | ICD-10-CM

## 2014-02-08 DIAGNOSIS — Z170 Estrogen receptor positive status [ER+]: Secondary | ICD-10-CM

## 2014-02-08 DIAGNOSIS — Z800 Family history of malignant neoplasm of digestive organs: Secondary | ICD-10-CM

## 2014-02-08 DIAGNOSIS — Z5111 Encounter for antineoplastic chemotherapy: Secondary | ICD-10-CM

## 2014-02-08 DIAGNOSIS — D0510 Intraductal carcinoma in situ of unspecified breast: Secondary | ICD-10-CM

## 2014-02-08 DIAGNOSIS — R232 Flushing: Secondary | ICD-10-CM

## 2014-02-08 LAB — COMPREHENSIVE METABOLIC PANEL (CC13)
ALBUMIN: 4.1 g/dL (ref 3.5–5.0)
ALT: 16 U/L (ref 0–55)
AST: 19 U/L (ref 5–34)
Alkaline Phosphatase: 111 U/L (ref 40–150)
Anion Gap: 9 mEq/L (ref 3–11)
BUN: 21.7 mg/dL (ref 7.0–26.0)
CO2: 30 meq/L — AB (ref 22–29)
CREATININE: 0.9 mg/dL (ref 0.6–1.1)
Calcium: 10.2 mg/dL (ref 8.4–10.4)
Chloride: 101 mEq/L (ref 98–109)
Glucose: 91 mg/dl (ref 70–140)
Potassium: 3.7 mEq/L (ref 3.5–5.1)
Sodium: 140 mEq/L (ref 136–145)
TOTAL PROTEIN: 7.7 g/dL (ref 6.4–8.3)
Total Bilirubin: 0.52 mg/dL (ref 0.20–1.20)

## 2014-02-08 LAB — CBC WITH DIFFERENTIAL/PLATELET
BASO%: 0.2 % (ref 0.0–2.0)
Basophils Absolute: 0 10*3/uL (ref 0.0–0.1)
EOS ABS: 0.1 10*3/uL (ref 0.0–0.5)
EOS%: 1.7 % (ref 0.0–7.0)
HCT: 41.3 % (ref 34.8–46.6)
HEMOGLOBIN: 13.7 g/dL (ref 11.6–15.9)
LYMPH#: 0.8 10*3/uL — AB (ref 0.9–3.3)
LYMPH%: 16.8 % (ref 14.0–49.7)
MCH: 26.9 pg (ref 25.1–34.0)
MCHC: 33.2 g/dL (ref 31.5–36.0)
MCV: 81.1 fL (ref 79.5–101.0)
MONO#: 0.3 10*3/uL (ref 0.1–0.9)
MONO%: 6.4 % (ref 0.0–14.0)
NEUT#: 3.5 10*3/uL (ref 1.5–6.5)
NEUT%: 74.9 % (ref 38.4–76.8)
Platelets: 227 10*3/uL (ref 145–400)
RBC: 5.09 10*6/uL (ref 3.70–5.45)
RDW: 15.4 % — AB (ref 11.2–14.5)
WBC: 4.7 10*3/uL (ref 3.9–10.3)
nRBC: 0 % (ref 0–0)

## 2014-02-08 MED ORDER — FAMOTIDINE IN NACL 20-0.9 MG/50ML-% IV SOLN
INTRAVENOUS | Status: AC
  Filled 2014-02-08: qty 50

## 2014-02-08 MED ORDER — PACLITAXEL CHEMO INJECTION 300 MG/50ML
65.0000 mg/m2 | Freq: Once | INTRAVENOUS | Status: AC
  Administered 2014-02-08: 144 mg via INTRAVENOUS
  Filled 2014-02-08: qty 24

## 2014-02-08 MED ORDER — DIPHENHYDRAMINE HCL 50 MG/ML IJ SOLN
25.0000 mg | Freq: Once | INTRAMUSCULAR | Status: AC
  Administered 2014-02-08: 25 mg via INTRAVENOUS

## 2014-02-08 MED ORDER — SODIUM CHLORIDE 0.9 % IV SOLN
Freq: Once | INTRAVENOUS | Status: AC
  Administered 2014-02-08: 13:00:00 via INTRAVENOUS

## 2014-02-08 MED ORDER — ONDANSETRON 8 MG/50ML IVPB (CHCC)
8.0000 mg | Freq: Once | INTRAVENOUS | Status: AC
  Administered 2014-02-08: 8 mg via INTRAVENOUS

## 2014-02-08 MED ORDER — HEPARIN SOD (PORK) LOCK FLUSH 100 UNIT/ML IV SOLN
500.0000 [IU] | Freq: Once | INTRAVENOUS | Status: AC | PRN
  Administered 2014-02-08: 500 [IU]
  Filled 2014-02-08: qty 5

## 2014-02-08 MED ORDER — VENLAFAXINE HCL 37.5 MG PO TABS: 37.5000 mg | ORAL_TABLET | Freq: Four times a day (QID) | ORAL | Status: DC

## 2014-02-08 MED ORDER — FAMOTIDINE IN NACL 20-0.9 MG/50ML-% IV SOLN
20.0000 mg | Freq: Once | INTRAVENOUS | Status: AC
  Administered 2014-02-08: 20 mg via INTRAVENOUS

## 2014-02-08 MED ORDER — ONDANSETRON 8 MG/NS 50 ML IVPB
INTRAVENOUS | Status: AC
  Filled 2014-02-08: qty 8

## 2014-02-08 MED ORDER — DEXAMETHASONE SODIUM PHOSPHATE 20 MG/5ML IJ SOLN
12.0000 mg | Freq: Once | INTRAMUSCULAR | Status: AC
  Administered 2014-02-08: 12 mg via INTRAVENOUS

## 2014-02-08 MED ORDER — DEXAMETHASONE SODIUM PHOSPHATE 20 MG/5ML IJ SOLN
INTRAMUSCULAR | Status: AC
  Filled 2014-02-08: qty 5

## 2014-02-08 MED ORDER — SODIUM CHLORIDE 0.9 % IJ SOLN
10.0000 mL | INTRAMUSCULAR | Status: DC | PRN
  Administered 2014-02-08: 10 mL
  Filled 2014-02-08: qty 10

## 2014-02-08 MED ORDER — DIPHENHYDRAMINE HCL 50 MG/ML IJ SOLN
INTRAMUSCULAR | Status: AC
  Filled 2014-02-08: qty 1

## 2014-02-08 NOTE — Progress Notes (Signed)
Jennifer Frazier  [phone]) [phone] [phone]) [phone]     ID: Jennifer Frazier DOB: 1976/01/07  MR#: [id number]  HQI#:[id number]  Patient Care Team: Sharilyn Sites, MD as PCP - General (Family Medicine) Chauncey Cruel, MD as Consulting Physician (Oncology) Thea Silversmith, MD as Consulting Physician (Radiation Oncology) [name], MD as Consulting Physician (General Surgery)   CHIEF COMPLAINT: Estrogen receptor positive stage I breast cancer CURRENT TREATMENT: receiving adjuvant chemotherapy   BREAST CANCER HISTORY: From the original intake note:  The patient had screening mammography at [facility] showing calcifications in the right breast, and was referred to the breast Center 07/06/2013 for additional views. Right diagnostic mammography confirmed a group of amorphous calcifications in the upper outer right breast measuring 1.2 cm.  Biopsy of this area 07/17/2013 showed ([id number]) ductal carcinoma in situ, high-grade, estrogen receptor 100% positive, and progesterone receptor 96% positive, both with strong staining intensity.  The patient was then referred to surgery and on 07/25/2013 underwent bilateral breast MRI. This showed a breast density category CEA. In the right breast there was an area of clumped nodular enhancement measuring 3 cm. There was also a 1.5 cm circumscribed oval mass in the medial portion of the right breast consistent with a fibroadenoma. The left breast was unremarkable, and there were no abnormal appearing lymph nodes.  On 08/08/2013 the patient underwent right lumpectomy and sentinel lymph node sampling. The pathology ([id number] was (showed, in addition to ductal carcinoma in situ, invasive ductal carcinoma, measuring 0.7 cm, grade 3, estrogen receptor 86% positive, progesterone receptor 89% positive, both with strong staining intensity, with an MIB-1 of 62%, and no HER-2 amplification. Both sentinel lymph nodes were clear.  Margins were ample.   The patient's subsequent history is as detailed below  INTERVAL HISTORY: Jennifer Frazier returns today for follow up of her breast cancer, accompanied by her mother. Today is day 1 cycle 8 of 12 planned cycles of paclitaxel. Last week's dose was held because of new neuropathy symptoms. This week she feels no numbness or tinging to her fingertips or toes at all. There is a small half inch strip of numbness to her left heel which is very faint. The interval history is significant for an ED visit for headaches believed to be caused by her blood pressure. She was started on 18m HCTZ and she has been keeping a log of her pressures. They are improved to 140s/80s and she has had no more headaches. She has chosen to wean herself off of the venlafaxine, and has been given instructions on how to do so safely.   REVIEW OF SYSTEMS: WSindydenies fevers, chills, nausea, vomiting, or changes in bowel or bladder habits. Her appetite is fair and she is drinking well. She denies mouth sores or rashes. She bumped her left shin on a step-stool and has a small hematoma to this site. She has no headaches, dizziness, or vision changes. She is sleeping somewhat better. She denies shortness of breath, chest pain, cough, or palpitations. A detailed review of systems is otherwise negative.   PAST MEDICAL HISTORY: Past Medical History  Diagnosis Date  . Fibroids     uterine fibroids  . Anemia   . Thyroid disease   . Contact lens/glasses fitting     wears contacts or glasses  . Sleep apnea     has a cpap-does not always use it  . Cancer     Breast    PAST SURGICAL HISTORY: Past Surgical History  Procedure Laterality Date  . [place]  2012    urerine ablasion  . Eye surgery  2013    retinal tear-lazer-both  . Wisdom tooth extraction    . Breast surgery      lumpectomy 7/14  . Portacath placement Right 09/29/2013    Procedure: INSERTION PORT-A-CATH WITH ULTRA SOUND;  Surgeon: [name], MD;   Location: [facility];  Service: General;  Laterality: Right;  . Port a cath revision N/A 12/19/2013    Procedure: REPOSITION PORT;  Surgeon: [name], MD;  Location: [facility];  Service: General;  Laterality: N/A;    FAMILY HISTORY Family History  Problem Relation Age of Onset  . Cancer Father 12    colon  . Cancer Maternal Aunt 50    breast cancer  . Cancer Paternal Aunt 65    breast cancer  . Cancer Maternal Grandmother 31    ovarian or uterine cancer  . Cancer Paternal Grandmother     unknown primary   the patient's father died at the age of 55, been diagnosed with colon cancer at the age of 2. The patient's mother is living, currently 76 years old. The patient has one brother, no sisters. One of the patient's mother's sisters was diagnosed with breast cancer at the age of 20 in one of the patient's father's mother was diagnosed with breast cancer at the age of 69. There is no history of ovarian cancer in the family.  GYNECOLOGIC HISTORY:  Patient's last menstrual period was 11/08/2013. Menarche age 1, for the patient is GX P0. She still having regular periods. 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    ADVANCED DIRECTIVES: Not in place; at the 08/24/2013 visit the patient was given the appropriate documents to come feel notarize associate may declare healthcare power of attorney.   HEALTH MAINTENANCE: History  Substance Use Topics  . Smoking status: Never Smoker   . Smokeless tobacco: Not on file  . Alcohol Use: No     Colonoscopy:  PAP:  Bone density:  Lipid panel:  Allergies  Allergen Reactions  . Gadolinium      Desc: NAUSEA WITH MRI CONTRAST-NO VOMITING   . Tramadol Hives and Nausea Only     Current Outpatient Prescriptions  Medication Sig Dispense Refill  . Cholecalciferol (VITAMIN D3) 3000 UNITS TABS Take 10,000 Units by mouth daily.    . diphenhydramine-acetaminophen (TYLENOL PM) 25-500 MG TABS Take 1 tablet by mouth at bedtime as needed (pain/headache).    . hydrochlorothiazide (HYDRODIURIL) 25 MG tablet Take 1 tablet (25 mg total) by mouth daily. 30 tablet 0  . lidocaine-prilocaine (EMLA) cream Apply 1 application topically as needed. 30 g 0  . thyroid (ARMOUR) 130 MG tablet Take 130 mg by mouth daily.    [name] venlafaxine XR (EFFEXOR-XR) 75 MG 24 hr capsule Take 1 capsule (75 mg total) by mouth daily with breakfast. 90 capsule 3  . vitamin B-12 (CYANOCOBALAMIN) 1000 MCG tablet Take 5,000 mcg by mouth daily.    [name] gabapentin (NEURONTIN) 300 MG capsule Take 1 capsule (300 mg total) by mouth 3 (three) times daily. (Patient not taking: Reported on 02/08/2014) 30 capsule 3  . HYDROcodone-acetaminophen (NORCO) 5-325 MG per tablet Take 1 tablet by mouth every 6 (six) hours as needed  for moderate pain. (Patient not taking: Reported on 02/08/2014) 30 tablet 0  . ibuprofen (ADVIL,MOTRIN) 100 MG/5ML suspension Take 200 mg by mouth every 4 (four) hours as needed.    . promethazine (PHENERGAN) 25 MG tablet Take 25 mg by mouth every 6 (six) hours as needed for nausea or vomiting.    . traZODone (DESYREL) 50 MG tablet Take 1 tablet (50 mg total) by mouth at bedtime. (Patient not taking: Reported on 12/22/2013) 30 tablet 2   No current facility-administered medications for this visit.    OBJECTIVE: Middle-aged [place] American woman who appears stated age 58 Vitals:   02/08/14 1119  BP: 139/97  Pulse: 93  Temp: 98 F (36.7 C)  Resp: 19     Body mass index is 33.82 kg/(m^2).    ECOG FS:1 - Symptomatic but completely ambulatory  Skin: warm, dry, small nickel sized hematoma to left shin HEENT: sclerae anicteric, conjunctivae pink, oropharynx clear. No thrush or mucositis.  Lymph  Nodes: No cervical or supraclavicular lymphadenopathy  Lungs: clear to auscultation bilaterally, no rales, wheezes, or rhonci  Heart: regular rate and rhythm  Abdomen: round, soft, non tender, positive bowel sounds  Musculoskeletal: No focal spinal tenderness, no peripheral edema  Neuro: non focal, well oriented, positive affect  Breasts: deferred   LAB RESULTS:  CMP     Component Value Date/Time   NA 140 02/08/2014 1107   NA 131* 02/02/2014 0835   K 3.7 02/08/2014 1107   K 4.2 02/02/2014 0835   CL 101 02/02/2014 0835   CO2 30* 02/08/2014 1107   CO2 24 02/02/2014 0835   GLUCOSE 91 02/08/2014 1107   GLUCOSE 93 02/02/2014 0835   BUN 21.7 02/08/2014 1107   BUN 17 02/02/2014 0835   CREATININE 0.9 02/08/2014 1107   CREATININE 0.68 02/02/2014 0835   CALCIUM 10.2 02/08/2014 1107   CALCIUM 9.4 02/02/2014 0835   PROT 7.7 02/08/2014 1107   PROT 6.7 10/12/2013 1628   ALBUMIN 4.1 02/08/2014 1107   ALBUMIN 3.3* 10/12/2013 1628   AST 19 02/08/2014 1107   AST 15 10/12/2013 1628   ALT 16 02/08/2014 1107   ALT 16 10/12/2013 1628   ALKPHOS 111 02/08/2014 1107   ALKPHOS 93 10/12/2013 1628   BILITOT 0.52 02/08/2014 1107   BILITOT 0.4 10/12/2013 1628   GFRNONAA >90 02/02/2014 0835   GFRAA >90 02/02/2014 0835    I No results found for: SPEP  Lab Results  Component Value Date   WBC 4.7 02/08/2014   NEUTROABS 3.5 02/08/2014   HGB 13.7 02/08/2014   HCT 41.3 02/08/2014   MCV 81.1 02/08/2014   PLT 227 02/08/2014      Chemistry      Component Value Date/Time   NA 140 02/08/2014 1107   NA 131* 02/02/2014 0835   K 3.7 02/08/2014 1107   K 4.2 02/02/2014 0835   CL 101 02/02/2014 0835   CO2 30* 02/08/2014 1107   CO2 24 02/02/2014 0835   BUN 21.7 02/08/2014 1107   BUN 17 02/02/2014 0835   CREATININE 0.9 02/08/2014 1107   CREATININE 0.68 02/02/2014 0835      Component Value Date/Time   CALCIUM 10.2 02/08/2014 1107   CALCIUM 9.4 02/02/2014 0835   ALKPHOS 111 02/08/2014 1107    ALKPHOS 93 10/12/2013 1628   AST 19 02/08/2014 1107   AST 15 10/12/2013 1628   ALT 16 02/08/2014 1107   ALT 16 10/12/2013 1628   BILITOT 0.52 02/08/2014 1107   BILITOT  0.4 10/12/2013 1628       No results found for: LABCA2  No components found for: [id number]  No results for input(s): INR in the last 168 hours.  Urinalysis    Component Value Date/Time   COLORURINE YELLOW 02/02/2014 0845   APPEARANCEUR CLEAR 02/02/2014 0845   LABSPEC 1.009 02/02/2014 0845   PHURINE 6.0 02/02/2014 0845   GLUCOSEU NEGATIVE 02/02/2014 0845   HGBUR NEGATIVE 02/02/2014 0845   BILIRUBINUR NEGATIVE 02/02/2014 0845   KETONESUR NEGATIVE 02/02/2014 0845   PROTEINUR NEGATIVE 02/02/2014 0845   UROBILINOGEN 0.2 02/02/2014 0845   NITRITE NEGATIVE 02/02/2014 0845   LEUKOCYTESUR TRACE* 02/02/2014 0845    STUDIES: No results found.  ASSESSMENT: 39 y.o. BRCA negative Jennifer Frazier woman status post right breast biopsy 07/17/2013 for high-grade ductal carcinoma in situ, estrogen and progesterone receptor positive  (1) status post right lumpectomy and sentinel lymph node sampling 08/08/2013 for a pT1b pN0, stage IA invasive ductal carcinoma, grade 3, estrogen and progesterone receptor positive, with an MIB-1 of 62% and no HER-2 amplification  (2) genetics sent 08/02/2013 was normal but did identify a variant of uncertain significance called BRCA2, [id number].  (3) Oncotype score of 23 predicts a risk of outside the breast recurrence within 10 years of 15% if the patient's only systemic treatment is tamoxifen for 5 years. Adjuvant chemotherapy was recommended  (4) doxorubicin and cyclophosphamide in dose dense fashion x4 with Neulasta support to start 10/05/2013, to be followed by paclitaxel weekly x12  (5) adjuvant radiation to follow chemotherapy  (6) anti-estrogens for 5-10 years to follow radiation  (7) "flipped port:" The port head in Jennifer Frazier's anterior chest appeared unusually mobile. Port replaced on  12/19/13  PLAN: Jennifer Frazier is doing well today. The labs were reviewed in detail and were entirely stable. Her neuropathy symptoms have basically all resolved. She will proceed with cycle 8 of paclitaxel today, and obviously keep watch for more symptoms. She plans to restart the gabapentin for her hot flashes tonight and this may help with any returning numbness as well.   Jennifer Frazier will continue on the HCTZ as directed by the ED as there has been obvious improvement in her BP. She will begin to wean off of the venlafaxine as directed.   Jennifer Frazier will return next week for labs, a follow up visit, and cycle 9 of paclitaxel. She understands and agrees with this plan. She knows the goal of treatment in her case is cure. She has been encouraged to call with any issues that might arise before her next visit here.   Marcelino Duster, NP   02/08/2014 11:55 AM

## 2014-02-08 NOTE — Telephone Encounter (Signed)
, °

## 2014-02-08 NOTE — Telephone Encounter (Signed)
Pt wants to wean off Venlafaxine 75 mg capsules. Sent a Rx for Venlafaxine 37.5 mg tablets and gave pt specific written instructions how to decrease this medication. Message to be forwarded to [facility].

## 2014-02-08 NOTE — Patient Instructions (Signed)
[facility] Discharge Instructions for Patients Receiving Chemotherapy  Today you received the following chemotherapy agents Taxol  To help prevent nausea and vomiting after your treatment, we encourage you to take your nausea medication    If you develop nausea and vomiting that is not controlled by your nausea medication, call the clinic.   BELOW ARE SYMPTOMS THAT SHOULD BE REPORTED IMMEDIATELY:  *FEVER GREATER THAN 100.5 F  *CHILLS WITH OR WITHOUT FEVER  NAUSEA AND VOMITING THAT IS NOT CONTROLLED WITH YOUR NAUSEA MEDICATION  *UNUSUAL SHORTNESS OF BREATH  *UNUSUAL BRUISING OR BLEEDING  TENDERNESS IN MOUTH AND THROAT WITH OR WITHOUT PRESENCE OF ULCERS  *URINARY PROBLEMS  *BOWEL PROBLEMS  UNUSUAL RASH Items with * indicate a potential emergency and should be followed up as soon as possible.  Feel free to call the clinic you have any questions or concerns. The clinic phone number is [phone].    

## 2014-02-09 ENCOUNTER — Telehealth: Payer: Self-pay | Admitting: *Deleted

## 2014-02-09 NOTE — Telephone Encounter (Signed)
Per staff message and POF I have scheduled appts. Advised scheduler of appts and to move lab appts  JMW  

## 2014-02-09 NOTE — Telephone Encounter (Signed)
Advised scheduler first available appts given

## 2014-02-15 ENCOUNTER — Ambulatory Visit (HOSPITAL_BASED_OUTPATIENT_CLINIC_OR_DEPARTMENT_OTHER): Payer: 59 | Admitting: Nurse Practitioner

## 2014-02-15 ENCOUNTER — Encounter: Payer: Self-pay | Admitting: Nurse Practitioner

## 2014-02-15 ENCOUNTER — Other Ambulatory Visit (HOSPITAL_BASED_OUTPATIENT_CLINIC_OR_DEPARTMENT_OTHER): Payer: 59

## 2014-02-15 ENCOUNTER — Ambulatory Visit (HOSPITAL_BASED_OUTPATIENT_CLINIC_OR_DEPARTMENT_OTHER): Payer: 59

## 2014-02-15 VITALS — BP 130/87 | HR 90 | Temp 98.3°F | Resp 18 | Ht 69.0 in | Wt 231.0 lb

## 2014-02-15 DIAGNOSIS — C50411 Malignant neoplasm of upper-outer quadrant of right female breast: Secondary | ICD-10-CM

## 2014-02-15 DIAGNOSIS — D0510 Intraductal carcinoma in situ of unspecified breast: Secondary | ICD-10-CM

## 2014-02-15 DIAGNOSIS — Z803 Family history of malignant neoplasm of breast: Secondary | ICD-10-CM

## 2014-02-15 DIAGNOSIS — Z800 Family history of malignant neoplasm of digestive organs: Secondary | ICD-10-CM

## 2014-02-15 DIAGNOSIS — Z5111 Encounter for antineoplastic chemotherapy: Secondary | ICD-10-CM

## 2014-02-15 LAB — CBC WITH DIFFERENTIAL/PLATELET
BASO%: 0.6 % (ref 0.0–2.0)
Basophils Absolute: 0 10*3/uL (ref 0.0–0.1)
EOS%: 3.1 % (ref 0.0–7.0)
Eosinophils Absolute: 0.1 10*3/uL (ref 0.0–0.5)
HEMATOCRIT: 39.6 % (ref 34.8–46.6)
HEMOGLOBIN: 13.2 g/dL (ref 11.6–15.9)
LYMPH%: 24.2 % (ref 14.0–49.7)
MCH: 26.7 pg (ref 25.1–34.0)
MCHC: 33.3 g/dL (ref 31.5–36.0)
MCV: 80 fL (ref 79.5–101.0)
MONO#: 0.1 10*3/uL (ref 0.1–0.9)
MONO%: 4.4 % (ref 0.0–14.0)
NEUT#: 2.2 10*3/uL (ref 1.5–6.5)
NEUT%: 67.7 % (ref 38.4–76.8)
NRBC: 0 % (ref 0–0)
PLATELETS: 207 10*3/uL (ref 145–400)
RBC: 4.95 10*6/uL (ref 3.70–5.45)
RDW: 15.3 % — AB (ref 11.2–14.5)
WBC: 3.2 10*3/uL — AB (ref 3.9–10.3)
lymph#: 0.8 10*3/uL — ABNORMAL LOW (ref 0.9–3.3)

## 2014-02-15 LAB — TECHNOLOGIST REVIEW

## 2014-02-15 MED ORDER — FAMOTIDINE IN NACL 20-0.9 MG/50ML-% IV SOLN
INTRAVENOUS | Status: AC
  Filled 2014-02-15: qty 50

## 2014-02-15 MED ORDER — DEXAMETHASONE SODIUM PHOSPHATE 20 MG/5ML IJ SOLN
INTRAMUSCULAR | Status: AC
  Filled 2014-02-15: qty 5

## 2014-02-15 MED ORDER — DIPHENHYDRAMINE HCL 50 MG/ML IJ SOLN
INTRAMUSCULAR | Status: AC
  Filled 2014-02-15: qty 1

## 2014-02-15 MED ORDER — SODIUM CHLORIDE 0.9 % IV SOLN
Freq: Once | INTRAVENOUS | Status: AC
  Administered 2014-02-15: 14:00:00 via INTRAVENOUS

## 2014-02-15 MED ORDER — PACLITAXEL CHEMO INJECTION 300 MG/50ML
65.0000 mg/m2 | Freq: Once | INTRAVENOUS | Status: AC
  Administered 2014-02-15: 144 mg via INTRAVENOUS
  Filled 2014-02-15: qty 24

## 2014-02-15 MED ORDER — HEPARIN SOD (PORK) LOCK FLUSH 100 UNIT/ML IV SOLN
500.0000 [IU] | Freq: Once | INTRAVENOUS | Status: AC | PRN
  Administered 2014-02-15: 500 [IU]
  Filled 2014-02-15: qty 5

## 2014-02-15 MED ORDER — SODIUM CHLORIDE 0.9 % IJ SOLN
10.0000 mL | INTRAMUSCULAR | Status: DC | PRN
  Administered 2014-02-15: 10 mL
  Filled 2014-02-15: qty 10

## 2014-02-15 MED ORDER — FAMOTIDINE IN NACL 20-0.9 MG/50ML-% IV SOLN
20.0000 mg | Freq: Once | INTRAVENOUS | Status: AC
  Administered 2014-02-15: 20 mg via INTRAVENOUS

## 2014-02-15 MED ORDER — ONDANSETRON 8 MG/50ML IVPB (CHCC)
8.0000 mg | Freq: Once | INTRAVENOUS | Status: AC
  Administered 2014-02-15: 8 mg via INTRAVENOUS

## 2014-02-15 MED ORDER — ONDANSETRON 8 MG/NS 50 ML IVPB
INTRAVENOUS | Status: AC
  Filled 2014-02-15: qty 8

## 2014-02-15 MED ORDER — DIPHENHYDRAMINE HCL 50 MG/ML IJ SOLN
25.0000 mg | Freq: Once | INTRAMUSCULAR | Status: AC
  Administered 2014-02-15: 25 mg via INTRAVENOUS

## 2014-02-15 MED ORDER — DEXAMETHASONE SODIUM PHOSPHATE 20 MG/5ML IJ SOLN
12.0000 mg | Freq: Once | INTRAMUSCULAR | Status: AC
  Administered 2014-02-15: 12 mg via INTRAVENOUS

## 2014-02-15 NOTE — Patient Instructions (Signed)
[facility] Discharge Instructions for Patients Receiving Chemotherapy  Today you received the following chemotherapy agents: Taxol.  To help prevent nausea and vomiting after your treatment, we encourage you to take your nausea medication as prescribed.   If you develop nausea and vomiting that is not controlled by your nausea medication, call the clinic.   BELOW ARE SYMPTOMS THAT SHOULD BE REPORTED IMMEDIATELY:  *FEVER GREATER THAN 100.5 F  *CHILLS WITH OR WITHOUT FEVER  NAUSEA AND VOMITING THAT IS NOT CONTROLLED WITH YOUR NAUSEA MEDICATION  *UNUSUAL SHORTNESS OF BREATH  *UNUSUAL BRUISING OR BLEEDING  TENDERNESS IN MOUTH AND THROAT WITH OR WITHOUT PRESENCE OF ULCERS  *URINARY PROBLEMS  *BOWEL PROBLEMS  UNUSUAL RASH Items with * indicate a potential emergency and should be followed up as soon as possible.  Feel free to call the clinic you have any questions or concerns. The clinic phone number is [phone].    

## 2014-02-15 NOTE — Progress Notes (Signed)
[facility]  Telephone:(336) [phone] Fax:(336) [phone]     ID: Jennifer Frazier DOB: 11-Apr-1975  MR#: [id number]  SFK#:[id number]  Patient Care Team: Sharilyn Sites, MD as PCP - General (Family Medicine) Chauncey Cruel, MD as Consulting Physician (Oncology) Thea Silversmith, MD as Consulting Physician (Radiation Oncology) [name], MD as Consulting Physician (General Surgery)   CHIEF COMPLAINT: Estrogen receptor positive stage I breast cancer CURRENT TREATMENT: receiving adjuvant chemotherapy   BREAST CANCER HISTORY: From the original intake note:  The patient had screening mammography at [facility] showing calcifications in the right breast, and was referred to the breast Center 07/06/2013 for additional views. Right diagnostic mammography confirmed a group of amorphous calcifications in the upper outer right breast measuring 1.2 cm.  Biopsy of this area 07/17/2013 showed ([id number]) ductal carcinoma in situ, high-grade, estrogen receptor 100% positive, and progesterone receptor 96% positive, both with strong staining intensity.  The patient was then referred to surgery and on 07/25/2013 underwent bilateral breast MRI. This showed a breast density category CEA. In the right breast there was an area of clumped nodular enhancement measuring 3 cm. There was also a 1.5 cm circumscribed oval mass in the medial portion of the right breast consistent with a fibroadenoma. The left breast was unremarkable, and there were no abnormal appearing lymph nodes.  On 08/08/2013 the patient underwent right lumpectomy and sentinel lymph node sampling. The pathology ([id number] was (showed, in addition to ductal carcinoma in situ, invasive ductal carcinoma, measuring 0.7 cm, grade 3, estrogen receptor 86% positive, progesterone receptor 89% positive, both with strong staining intensity, with an MIB-1 of 62%, and no HER-2 amplification. Both sentinel lymph nodes were clear.  Margins were ample.   The patient's subsequent history is as detailed below  INTERVAL HISTORY: Jennifer Frazier returns today for follow up of her breast cancer, accompanied by her mother. Today is day 1 cycle 9 of 12 planned cycles of paclitaxel. She continues to wean off of the venlafaxine and her blood pressures have improved on the HCTZ now in the 130s/80s. She has some mild headaches, but this may be light withdrawal symptoms vs being blood pressure related.  REVIEW OF SYSTEMS: Jennifer Frazier denies fevers, chills, nausea, vomiting, or changes in bowel or bladder habits. Her appetite is fair and she is drinking well. She denies mouth sores or rashes. The tips of 3 fingers to her left hand are "sensitive." She has no headaches, dizziness, or vision changes. She is continuing to sleep better. She got 7 full hours last night. She denies shortness of breath, chest pain, cough, or palpitations. A detailed review of systems is otherwise negative.   PAST MEDICAL HISTORY: Past Medical History  Diagnosis Date  . Fibroids     uterine fibroids  . Anemia   . Thyroid disease   . Contact lens/glasses fitting     wears contacts or glasses  . Sleep apnea     has a cpap-does not always use it  . Cancer     Breast    PAST SURGICAL HISTORY: Past Surgical History  Procedure Laterality Date  . [place]  2012    urerine ablasion  . Eye surgery  2013    retinal tear-lazer-both  . Wisdom tooth extraction    . Breast surgery      lumpectomy 7/14  . Portacath placement Right 09/29/2013    Procedure: INSERTION PORT-A-CATH WITH ULTRA SOUND;  Surgeon: [name], MD;  Location: [facility];  Service: General;  Laterality: Right;  . Port a cath revision N/A 12/19/2013    Procedure: REPOSITION PORT;  Surgeon: [name], MD;  Location: [facility];  Service: General;  Laterality: N/A;    FAMILY HISTORY Family History  Problem Relation Age of Onset  . Cancer Father 87    colon  . Cancer  Maternal Aunt 50    breast cancer  . Cancer Paternal Aunt 76    breast cancer  . Cancer Maternal Grandmother 64    ovarian or uterine cancer  . Cancer Paternal Grandmother     unknown primary   the patient's father died at the age of 60, been diagnosed with colon cancer at the age of 25. The patient's mother is living, currently 24 years old. The patient has one brother, no sisters. One of the patient's mother's sisters was diagnosed with breast cancer at the age of 62 in one of the patient's father's mother was diagnosed with breast cancer at the age of 26. There is no history of ovarian cancer in the family.  GYNECOLOGIC HISTORY:  Patient's last menstrual period was 11/08/2013. Menarche age 6, for the patient is GX P0. She still having regular periods. 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    ADVANCED DIRECTIVES: Not in place; at the 08/24/2013 visit the patient was given the appropriate documents to come feel notarize associate may declare healthcare power of attorney.   HEALTH MAINTENANCE: History  Substance Use Topics  . Smoking status: Never Smoker   . Smokeless tobacco: Not on file  . Alcohol Use: No     Colonoscopy:  PAP:  Bone density:  Lipid panel:  Allergies  Allergen Reactions  . Gadolinium      Desc: NAUSEA WITH MRI CONTRAST-NO VOMITING   . Tramadol Hives and Nausea Only    Current Outpatient Prescriptions  Medication Sig Dispense Refill  . Cholecalciferol (VITAMIN D3) 3000 UNITS TABS Take 10,000 Units by mouth daily.    . hydrochlorothiazide (HYDRODIURIL) 25 MG tablet Take 1 tablet (25 mg total) by mouth daily. 30 tablet 0  . lidocaine-prilocaine (EMLA) cream Apply 1 application topically as needed. 30 g 0  . thyroid  (ARMOUR) 130 MG tablet Take 130 mg by mouth daily.    [name] venlafaxine (EFFEXOR) 37.5 MG tablet Take 1 tablet (37.5 mg total) by mouth taper from 4 doses each day to 1 dose and stop. 45 tablet 0  . venlafaxine XR (EFFEXOR-XR) 75 MG 24 hr capsule Take 1 capsule (75 mg total) by mouth daily with breakfast. 90 capsule 3  . vitamin B-12 (CYANOCOBALAMIN) 1000 MCG tablet Take 5,000 mcg by mouth daily.    . diphenhydramine-acetaminophen (TYLENOL PM) 25-500 MG TABS Take 1 tablet by mouth at bedtime as needed (pain/headache).    . gabapentin (NEURONTIN) 300 MG capsule Take 1 capsule (300 mg total) by mouth 3 (three) times daily. (Patient not taking: Reported on 02/08/2014) 30 capsule 3  . HYDROcodone-acetaminophen (NORCO) 5-325 MG per tablet Take 1 tablet by mouth every 6 (six) hours as needed for moderate pain. (Patient not taking: Reported on 02/08/2014) 30 tablet 0  . ibuprofen (ADVIL,MOTRIN) 100 MG/5ML suspension Take 200 mg by mouth every 4 (four) hours as needed.    . promethazine (PHENERGAN) 25 MG tablet Take  25 mg by mouth every 6 (six) hours as needed for nausea or vomiting.    . traZODone (DESYREL) 50 MG tablet Take 1 tablet (50 mg total) by mouth at bedtime. (Patient not taking: Reported on 12/22/2013) 30 tablet 2   No current facility-administered medications for this visit.    OBJECTIVE: Middle-aged [place] American woman who appears stated age 34 Vitals:   02/15/14 1108  BP: 130/87  Pulse: 90  Temp: 98.3 F (36.8 C)  Resp: 18     Body mass index is 34.1 kg/(m^2).    ECOG FS:1 - Symptomatic but completely ambulatory  Sclerae unicteric, pupils equal and reactive Oropharynx clear and moist-- no thrush No cervical or supraclavicular adenopathy Lungs no rales or rhonchi Heart regular rate and rhythm Abd soft, nontender, positive bowel sounds MSK no focal spinal tenderness, no upper extremity lymphedema Neuro: nonfocal, well oriented, appropriate affect Breasts: deferred  LAB  RESULTS:  CMP     Component Value Date/Time   NA 140 02/08/2014 1107   NA 131* 02/02/2014 0835   K 3.7 02/08/2014 1107   K 4.2 02/02/2014 0835   CL 101 02/02/2014 0835   CO2 30* 02/08/2014 1107   CO2 24 02/02/2014 0835   GLUCOSE 91 02/08/2014 1107   GLUCOSE 93 02/02/2014 0835   BUN 21.7 02/08/2014 1107   BUN 17 02/02/2014 0835   CREATININE 0.9 02/08/2014 1107   CREATININE 0.68 02/02/2014 0835   CALCIUM 10.2 02/08/2014 1107   CALCIUM 9.4 02/02/2014 0835   PROT 7.7 02/08/2014 1107   PROT 6.7 10/12/2013 1628   ALBUMIN 4.1 02/08/2014 1107   ALBUMIN 3.3* 10/12/2013 1628   AST 19 02/08/2014 1107   AST 15 10/12/2013 1628   ALT 16 02/08/2014 1107   ALT 16 10/12/2013 1628   ALKPHOS 111 02/08/2014 1107   ALKPHOS 93 10/12/2013 1628   BILITOT 0.52 02/08/2014 1107   BILITOT 0.4 10/12/2013 1628   GFRNONAA >90 02/02/2014 0835   GFRAA >90 02/02/2014 0835    I No results found for: SPEP  Lab Results  Component Value Date   WBC 3.2* 02/15/2014   NEUTROABS 2.2 02/15/2014   HGB 13.2 02/15/2014   HCT 39.6 02/15/2014   MCV 80.0 02/15/2014   PLT 207 02/15/2014      Chemistry      Component Value Date/Time   NA 140 02/08/2014 1107   NA 131* 02/02/2014 0835   K 3.7 02/08/2014 1107   K 4.2 02/02/2014 0835   CL 101 02/02/2014 0835   CO2 30* 02/08/2014 1107   CO2 24 02/02/2014 0835   BUN 21.7 02/08/2014 1107   BUN 17 02/02/2014 0835   CREATININE 0.9 02/08/2014 1107   CREATININE 0.68 02/02/2014 0835      Component Value Date/Time   CALCIUM 10.2 02/08/2014 1107   CALCIUM 9.4 02/02/2014 0835   ALKPHOS 111 02/08/2014 1107   ALKPHOS 93 10/12/2013 1628   AST 19 02/08/2014 1107   AST 15 10/12/2013 1628   ALT 16 02/08/2014 1107   ALT 16 10/12/2013 1628   BILITOT 0.52 02/08/2014 1107   BILITOT 0.4 10/12/2013 1628       No results found for: LABCA2  No components found for: LABCA125  No results for input(s): INR in the last 168 hours.  Urinalysis    Component Value  Date/Time   COLORURINE YELLOW 02/02/2014 0845   APPEARANCEUR CLEAR 02/02/2014 0845   LABSPEC 1.009 02/02/2014 0845   PHURINE 6.0 02/02/2014 0845   GLUCOSEU  NEGATIVE 02/02/2014 0845   HGBUR NEGATIVE 02/02/2014 0845   BILIRUBINUR NEGATIVE 02/02/2014 0845   KETONESUR NEGATIVE 02/02/2014 0845   PROTEINUR NEGATIVE 02/02/2014 0845   UROBILINOGEN 0.2 02/02/2014 0845   NITRITE NEGATIVE 02/02/2014 0845   LEUKOCYTESUR TRACE* 02/02/2014 0845    STUDIES: No results found.  ASSESSMENT: 39 y.o. BRCA negative [place] woman status post right breast biopsy 07/17/2013 for high-grade ductal carcinoma in situ, estrogen and progesterone receptor positive  (1) status post right lumpectomy and sentinel lymph node sampling 08/08/2013 for a pT1b pN0, stage IA invasive ductal carcinoma, grade 3, estrogen and progesterone receptor positive, with an MIB-1 of 62% and no HER-2 amplification  (2) genetics sent 08/02/2013 was normal but did identify a variant of uncertain significance called BRCA2, [id number].  (3) Oncotype score of 23 predicts a risk of outside the breast recurrence within 10 years of 15% if the patient's only systemic treatment is tamoxifen for 5 years. Adjuvant chemotherapy was recommended  (4) doxorubicin and cyclophosphamide in dose dense fashion x4 with Neulasta support to start 10/05/2013, to be followed by paclitaxel weekly x12  (5) adjuvant radiation to follow chemotherapy  (6) anti-estrogens for 5-10 years to follow radiation  (7) "flipped port:" The port head in Jennifer Frazier's anterior chest appeared unusually mobile. Port replaced on 12/19/13  PLAN: Jennifer Frazier continues to manage treatment well. The labs were reviewed in detail and were entirely stable. She will proceed with cycle 9 of paclitaxel today, and continue to watch for worsening neuropathy symptoms.   Jennifer Frazier will return next week for labs, a follow up visit, and cycle 10 of paclitaxel. She understands and agrees with this plan. She  knows the goal of treatment in her case is cure. She has been encouraged to call with any issues that might arise before her next visit here.     Jennifer Duster, NP   02/15/2014 11:36 AM

## 2014-02-15 NOTE — Progress Notes (Signed)
Per Susanne Borders NP, Dr. Jana Hakim draws pt's CMET every 2-3 weeks d/t she is stable and we may proceed with treatment.

## 2014-02-22 ENCOUNTER — Encounter: Payer: Self-pay | Admitting: Nurse Practitioner

## 2014-02-22 ENCOUNTER — Ambulatory Visit (HOSPITAL_BASED_OUTPATIENT_CLINIC_OR_DEPARTMENT_OTHER): Payer: 59 | Admitting: Nurse Practitioner

## 2014-02-22 ENCOUNTER — Other Ambulatory Visit: Payer: Self-pay | Admitting: Hematology and Oncology

## 2014-02-22 ENCOUNTER — Other Ambulatory Visit (HOSPITAL_BASED_OUTPATIENT_CLINIC_OR_DEPARTMENT_OTHER): Payer: 59

## 2014-02-22 ENCOUNTER — Ambulatory Visit (HOSPITAL_BASED_OUTPATIENT_CLINIC_OR_DEPARTMENT_OTHER): Payer: 59

## 2014-02-22 VITALS — BP 138/79 | HR 108 | Temp 98.3°F | Resp 19 | Ht 69.0 in | Wt 233.7 lb

## 2014-02-22 DIAGNOSIS — C50411 Malignant neoplasm of upper-outer quadrant of right female breast: Secondary | ICD-10-CM

## 2014-02-22 DIAGNOSIS — Z803 Family history of malignant neoplasm of breast: Secondary | ICD-10-CM

## 2014-02-22 DIAGNOSIS — Z800 Family history of malignant neoplasm of digestive organs: Secondary | ICD-10-CM

## 2014-02-22 DIAGNOSIS — Z5111 Encounter for antineoplastic chemotherapy: Secondary | ICD-10-CM

## 2014-02-22 DIAGNOSIS — D0510 Intraductal carcinoma in situ of unspecified breast: Secondary | ICD-10-CM

## 2014-02-22 LAB — COMPREHENSIVE METABOLIC PANEL (CC13)
ALK PHOS: 116 U/L (ref 40–150)
ALT: 18 U/L (ref 0–55)
AST: 19 U/L (ref 5–34)
Albumin: 3.9 g/dL (ref 3.5–5.0)
Anion Gap: 11 mEq/L (ref 3–11)
BILIRUBIN TOTAL: 0.43 mg/dL (ref 0.20–1.20)
BUN: 18.7 mg/dL (ref 7.0–26.0)
CHLORIDE: 103 meq/L (ref 98–109)
CO2: 28 mEq/L (ref 22–29)
Calcium: 10.4 mg/dL (ref 8.4–10.4)
Creatinine: 0.8 mg/dL (ref 0.6–1.1)
EGFR: >90 mL/min/{1.73_m2} (ref >90)
Glucose: 63 mg/dl — ABNORMAL LOW (ref 70–140)
Potassium: 3.7 mEq/L (ref 3.5–5.1)
Sodium: 141 mEq/L (ref 136–145)
Total Protein: 7.4 g/dL (ref 6.4–8.3)

## 2014-02-22 LAB — CBC WITH DIFFERENTIAL/PLATELET
BASO%: 0.6 % (ref 0.0–2.0)
Basophils Absolute: 0 10*3/uL (ref 0.0–0.1)
EOS ABS: 0.1 10*3/uL (ref 0.0–0.5)
EOS%: 2.3 % (ref 0.0–7.0)
HCT: 40 % (ref 34.8–46.6)
HEMOGLOBIN: 12.7 g/dL (ref 11.6–15.9)
LYMPH%: 18.1 % (ref 14.0–49.7)
MCH: 26.2 pg (ref 25.1–34.0)
MCHC: 31.6 g/dL (ref 31.5–36.0)
MCV: 82.7 fL (ref 79.5–101.0)
MONO#: 0.2 10*3/uL (ref 0.1–0.9)
MONO%: 4.9 % (ref 0.0–14.0)
NEUT#: 2.7 10*3/uL (ref 1.5–6.5)
NEUT%: 74.1 % (ref 38.4–76.8)
Platelets: 231 10*3/uL (ref 145–400)
RBC: 4.84 10*6/uL (ref 3.70–5.45)
RDW: 17.1 % — ABNORMAL HIGH (ref 11.2–14.5)
WBC: 3.6 10*3/uL — ABNORMAL LOW (ref 3.9–10.3)
lymph#: 0.7 10*3/uL — ABNORMAL LOW (ref 0.9–3.3)

## 2014-02-22 MED ORDER — DIPHENHYDRAMINE HCL 50 MG/ML IJ SOLN
25.0000 mg | Freq: Once | INTRAMUSCULAR | Status: AC
  Administered 2014-02-22: 25 mg via INTRAVENOUS

## 2014-02-22 MED ORDER — HEPARIN SOD (PORK) LOCK FLUSH 100 UNIT/ML IV SOLN
500.0000 [IU] | Freq: Once | INTRAVENOUS | Status: AC | PRN
  Administered 2014-02-22: 500 [IU]
  Filled 2014-02-22: qty 5

## 2014-02-22 MED ORDER — ONDANSETRON 8 MG/NS 50 ML IVPB
INTRAVENOUS | Status: AC
  Filled 2014-02-22: qty 8

## 2014-02-22 MED ORDER — ONDANSETRON 8 MG/50ML IVPB (CHCC)
8.0000 mg | Freq: Once | INTRAVENOUS | Status: AC
  Administered 2014-02-22: 8 mg via INTRAVENOUS

## 2014-02-22 MED ORDER — FAMOTIDINE IN NACL 20-0.9 MG/50ML-% IV SOLN
20.0000 mg | Freq: Once | INTRAVENOUS | Status: AC
  Administered 2014-02-22: 20 mg via INTRAVENOUS

## 2014-02-22 MED ORDER — PACLITAXEL CHEMO INJECTION 300 MG/50ML
65.0000 mg/m2 | Freq: Once | INTRAVENOUS | Status: AC
  Administered 2014-02-22: 144 mg via INTRAVENOUS
  Filled 2014-02-22: qty 24

## 2014-02-22 MED ORDER — DIPHENHYDRAMINE HCL 50 MG/ML IJ SOLN
INTRAMUSCULAR | Status: AC
  Filled 2014-02-22: qty 1

## 2014-02-22 MED ORDER — SODIUM CHLORIDE 0.9 % IJ SOLN
10.0000 mL | INTRAMUSCULAR | Status: DC | PRN
  Administered 2014-02-22: 10 mL
  Filled 2014-02-22: qty 10

## 2014-02-22 MED ORDER — FAMOTIDINE IN NACL 20-0.9 MG/50ML-% IV SOLN
INTRAVENOUS | Status: AC
  Filled 2014-02-22: qty 50

## 2014-02-22 MED ORDER — SODIUM CHLORIDE 0.9 % IV SOLN
Freq: Once | INTRAVENOUS | Status: AC
  Administered 2014-02-22: 12:00:00 via INTRAVENOUS

## 2014-02-22 MED ORDER — DEXAMETHASONE SODIUM PHOSPHATE 20 MG/5ML IJ SOLN
12.0000 mg | Freq: Once | INTRAMUSCULAR | Status: AC
  Administered 2014-02-22: 12 mg via INTRAVENOUS

## 2014-02-22 MED ORDER — DEXAMETHASONE SODIUM PHOSPHATE 20 MG/5ML IJ SOLN
INTRAMUSCULAR | Status: AC
  Filled 2014-02-22: qty 5

## 2014-02-22 NOTE — Progress Notes (Signed)
[facility]  Telephone:(336) [phone] Fax:(336) [phone]     ID: Jennifer Frazier DOB: 12-18-1975  MR#: [id number]  JME#:[id number]  Patient Care Team: Sharilyn Sites, MD as PCP - General (Family Medicine) Chauncey Cruel, MD as Consulting Physician (Oncology) Thea Silversmith, MD as Consulting Physician (Radiation Oncology) [name], MD as Consulting Physician (General Surgery)   CHIEF COMPLAINT: Estrogen receptor positive stage I breast cancer CURRENT TREATMENT: receiving adjuvant chemotherapy   BREAST CANCER HISTORY: From the original intake note:  The patient had screening mammography at [facility] showing calcifications in the right breast, and was referred to the breast Center 07/06/2013 for additional views. Right diagnostic mammography confirmed a group of amorphous calcifications in the upper outer right breast measuring 1.2 cm.  Biopsy of this area 07/17/2013 showed ([id number]) ductal carcinoma in situ, high-grade, estrogen receptor 100% positive, and progesterone receptor 96% positive, both with strong staining intensity.  The patient was then referred to surgery and on 07/25/2013 underwent bilateral breast MRI. This showed a breast density category CEA. In the right breast there was an area of clumped nodular enhancement measuring 3 cm. There was also a 1.5 cm circumscribed oval mass in the medial portion of the right breast consistent with a fibroadenoma. The left breast was unremarkable, and there were no abnormal appearing lymph nodes.  On 08/08/2013 the patient underwent right lumpectomy and sentinel lymph node sampling. The pathology ([id number] was (showed, in addition to ductal carcinoma in situ, invasive ductal carcinoma, measuring 0.7 cm, grade 3, estrogen receptor 86% positive, progesterone receptor 89% positive, both with strong staining intensity, with an MIB-1 of 62%, and no HER-2 amplification. Both sentinel lymph nodes were clear.  Margins were ample.   The patient's subsequent history is as detailed below  INTERVAL HISTORY: Jennifer Frazier returns today for follow up of her breast cancer, accompanied by her mother. Today is day 1 cycle 10 of 12 planned cycles of paclitaxel. She is doing well this week. She sees some blood when she blows her nose, but it is not actively bleeding. She is sleeping better and takes her gabapentin nightly which helps her hot flashes as well. Her fingertips are "sensitive" when she exerts a certain amount of pressure on them. She says the feel like she's soaked them in the tub. But she is able to perform all ADLs and there is no deficiency in her ability to grasp or grip. It is certainly no worse than last week.  REVIEW OF SYSTEMS: Jennifer Frazier denies fevers, chills, nausea, vomiting, or changes in bowel or bladder habits. Her appetite is fair and she is drinking well. She denies mouth sores or rashes. She has no headaches, dizziness, or vision changes. She denies shortness of breath, chest pain, cough, or palpitations. A detailed review of systems is otherwise negative.   PAST MEDICAL HISTORY: Past Medical History  Diagnosis Date  . Fibroids     uterine fibroids  . Anemia   . Thyroid disease   . Contact lens/glasses fitting     wears contacts or glasses  . Sleep apnea     has a cpap-does not always use it  . Cancer     Breast    PAST SURGICAL HISTORY: Past Surgical History  Procedure Laterality Date  . [place]  2012    urerine ablasion  . Eye surgery  2013    retinal tear-lazer-both  . Wisdom tooth extraction    . Breast surgery      lumpectomy  7/14  . Portacath placement Right 09/29/2013    Procedure: INSERTION PORT-A-CATH WITH ULTRA SOUND;  Surgeon: [name], MD;  Location: [facility];  Service: General;  Laterality: Right;  . Port a cath revision N/A 12/19/2013    Procedure: REPOSITION PORT;  Surgeon: [name], MD;  Location: [facility];  Service:  General;  Laterality: N/A;    FAMILY HISTORY Family History  Problem Relation Age of Onset  . Cancer Father 80    colon  . Cancer Maternal Aunt 50    breast cancer  . Cancer Paternal Aunt 79    breast cancer  . Cancer Maternal Grandmother 35    ovarian or uterine cancer  . Cancer Paternal Grandmother     unknown primary   the patient's father died at the age of 57, been diagnosed with colon cancer at the age of 87. The patient's mother is living, currently 23 years old. The patient has one brother, no sisters. One of the patient's mother's sisters was diagnosed with breast cancer at the age of 60 in one of the patient's father's mother was diagnosed with breast cancer at the age of 24. There is no history of ovarian cancer in the family.  GYNECOLOGIC HISTORY:  Patient's last menstrual period was 11/08/2013. Menarche age 21, for the patient is GX P0. She still having regular periods. 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    ADVANCED DIRECTIVES: Not in place; at the 08/24/2013 visit the patient was given the appropriate documents to come feel notarize associate may declare healthcare power of attorney.   HEALTH MAINTENANCE: History  Substance Use Topics  . Smoking status: Never Smoker   . Smokeless tobacco: Not on file  . Alcohol Use: No     Colonoscopy:  PAP:  Bone density:  Lipid panel:  Allergies  Allergen Reactions  . Gadolinium      Desc: NAUSEA WITH MRI CONTRAST-NO VOMITING   . Tramadol Hives and Nausea Only    Current Outpatient Prescriptions  Medication Sig Dispense Refill  . Cholecalciferol (VITAMIN D3) 3000 UNITS TABS Take 10,000 Units by mouth daily.    [name] gabapentin (NEURONTIN) 300 MG capsule Take 1 capsule (300 mg  total) by mouth 3 (three) times daily. 30 capsule 3  . hydrochlorothiazide (HYDRODIURIL) 25 MG tablet Take 1 tablet (25 mg total) by mouth daily. 30 tablet 0  . lidocaine-prilocaine (EMLA) cream Apply 1 application topically as needed. 30 g 0  . thyroid (ARMOUR) 130 MG tablet Take 130 mg by mouth daily.    [name] venlafaxine (EFFEXOR) 37.5 MG tablet Take 1 tablet (37.5 mg total) by mouth taper from 4 doses each day to 1 dose and stop. 45 tablet 0  . vitamin B-12 (CYANOCOBALAMIN) 1000 MCG tablet Take 5,000 mcg by mouth daily.    . diphenhydramine-acetaminophen (TYLENOL PM) 25-500 MG TABS Take 1 tablet by mouth at bedtime as needed (pain/headache).    [name] HYDROcodone-acetaminophen (NORCO) 5-325 MG per tablet Take 1 tablet by mouth every 6 (six) hours as needed for moderate pain. (Patient not taking: Reported on 02/08/2014) 30 tablet 0  . ibuprofen (ADVIL,MOTRIN) 100 MG/5ML suspension Take 200 mg by mouth every 4 (four) hours as needed.    . promethazine (PHENERGAN) 25 MG tablet Take 25 mg  by mouth every 6 (six) hours as needed for nausea or vomiting.    . traZODone (DESYREL) 50 MG tablet Take 1 tablet (50 mg total) by mouth at bedtime. (Patient not taking: Reported on 12/22/2013) 30 tablet 2   No current facility-administered medications for this visit.    OBJECTIVE: Middle-aged [place] American woman who appears stated age 88 Vitals:   02/22/14 1026  BP: 138/79  Pulse: 108  Temp: 98.3 F (36.8 C)  Resp: 19     Body mass index is 34.5 kg/(m^2).    ECOG FS:1 - Symptomatic but completely ambulatory  Skin: warm, dry  HEENT: sclerae anicteric, conjunctivae pink, oropharynx clear. No thrush or mucositis.  Lymph Nodes: No cervical or supraclavicular lymphadenopathy  Lungs: clear to auscultation bilaterally, no rales, wheezes, or rhonci  Heart: regular rate and rhythm  Abdomen: round, soft, non tender, positive bowel sounds  Musculoskeletal: No focal spinal tenderness, no peripheral edema  Neuro:  non focal, well oriented, positive affect  Breasts: deferred  LAB RESULTS:  CMP     Component Value Date/Time   NA 141 02/22/2014 1021   NA 131* 02/02/2014 0835   K 3.7 02/22/2014 1021   K 4.2 02/02/2014 0835   CL 101 02/02/2014 0835   CO2 28 02/22/2014 1021   CO2 24 02/02/2014 0835   GLUCOSE 63* 02/22/2014 1021   GLUCOSE 93 02/02/2014 0835   BUN 18.7 02/22/2014 1021   BUN 17 02/02/2014 0835   CREATININE 0.8 02/22/2014 1021   CREATININE 0.68 02/02/2014 0835   CALCIUM 10.4 02/22/2014 1021   CALCIUM 9.4 02/02/2014 0835   PROT 7.4 02/22/2014 1021   PROT 6.7 10/12/2013 1628   ALBUMIN 3.9 02/22/2014 1021   ALBUMIN 3.3* 10/12/2013 1628   AST 19 02/22/2014 1021   AST 15 10/12/2013 1628   ALT 18 02/22/2014 1021   ALT 16 10/12/2013 1628   ALKPHOS 116 02/22/2014 1021   ALKPHOS 93 10/12/2013 1628   BILITOT 0.43 02/22/2014 1021   BILITOT 0.4 10/12/2013 1628   GFRNONAA >90 02/02/2014 0835   GFRAA >90 02/02/2014 0835    I No results found for: SPEP  Lab Results  Component Value Date   WBC 3.6* 02/22/2014   NEUTROABS 2.7 02/22/2014   HGB 12.7 02/22/2014   HCT 40.0 02/22/2014   MCV 82.7 02/22/2014   PLT 231 02/22/2014      Chemistry      Component Value Date/Time   NA 141 02/22/2014 1021   NA 131* 02/02/2014 0835   K 3.7 02/22/2014 1021   K 4.2 02/02/2014 0835   CL 101 02/02/2014 0835   CO2 28 02/22/2014 1021   CO2 24 02/02/2014 0835   BUN 18.7 02/22/2014 1021   BUN 17 02/02/2014 0835   CREATININE 0.8 02/22/2014 1021   CREATININE 0.68 02/02/2014 0835      Component Value Date/Time   CALCIUM 10.4 02/22/2014 1021   CALCIUM 9.4 02/02/2014 0835   ALKPHOS 116 02/22/2014 1021   ALKPHOS 93 10/12/2013 1628   AST 19 02/22/2014 1021   AST 15 10/12/2013 1628   ALT 18 02/22/2014 1021   ALT 16 10/12/2013 1628   BILITOT 0.43 02/22/2014 1021   BILITOT 0.4 10/12/2013 1628       No results found for: LABCA2  No components found for: LABCA125  No results for  input(s): INR in the last 168 hours.  Urinalysis    Component Value Date/Time   COLORURINE YELLOW 02/02/2014 0845   APPEARANCEUR CLEAR  02/02/2014 0845   LABSPEC 1.009 02/02/2014 0845   PHURINE 6.0 02/02/2014 0845   GLUCOSEU NEGATIVE 02/02/2014 0845   HGBUR NEGATIVE 02/02/2014 0845   BILIRUBINUR NEGATIVE 02/02/2014 0845   KETONESUR NEGATIVE 02/02/2014 0845   PROTEINUR NEGATIVE 02/02/2014 0845   UROBILINOGEN 0.2 02/02/2014 0845   NITRITE NEGATIVE 02/02/2014 0845   LEUKOCYTESUR TRACE* 02/02/2014 0845    STUDIES: No results found.  ASSESSMENT: 39 y.o. BRCA negative [place] woman status post right breast biopsy 07/17/2013 for high-grade ductal carcinoma in situ, estrogen and progesterone receptor positive  (1) status post right lumpectomy and sentinel lymph node sampling 08/08/2013 for a pT1b pN0, stage IA invasive ductal carcinoma, grade 3, estrogen and progesterone receptor positive, with an MIB-1 of 62% and no HER-2 amplification  (2) genetics sent 08/02/2013 was normal but did identify a variant of uncertain significance called BRCA2, [id number].  (3) Oncotype score of 23 predicts a risk of outside the breast recurrence within 10 years of 15% if the patient's only systemic treatment is tamoxifen for 5 years. Adjuvant chemotherapy was recommended  (4) doxorubicin and cyclophosphamide in dose dense fashion x4 with Neulasta support to start 10/05/2013, to be followed by paclitaxel weekly x12  (5) adjuvant radiation to follow chemotherapy  (6) anti-estrogens for 5-10 years to follow radiation  (7) "flipped port:" The port head in Jennifer Frazier's anterior chest appeared unusually mobile. Port replaced on 12/19/13  PLAN: Jennifer Frazier is doing well today. The labs were reviewed in detail and were stable. She will proceed with cycle 10 of palcitaxel today and will continue to monitor for worsening neuropathy symptoms.   I advised Jennifer Frazier use a saline nasal spray to keep her nares moist. The air has  been exceptionally cold and dry for the past few weeks.   Jennifer Frazier will return next week for labs, a follow up visit, and cycle 11 of paclitaxel. She understands and agrees with this plan. She knows the goal of treatment in her case is cure. She has been encouraged to call with any issues that might arise before her next visit here.     Jennifer Duster, NP   02/22/2014 10:58 AM

## 2014-02-22 NOTE — Patient Instructions (Signed)
[facility] Discharge Instructions for Patients Receiving Chemotherapy  Today you received the following chemotherapy agents: Taxol.  To help prevent nausea and vomiting after your treatment, we encourage you to take your nausea medication as prescribed.   If you develop nausea and vomiting that is not controlled by your nausea medication, call the clinic.   BELOW ARE SYMPTOMS THAT SHOULD BE REPORTED IMMEDIATELY:  *FEVER GREATER THAN 100.5 F  *CHILLS WITH OR WITHOUT FEVER  NAUSEA AND VOMITING THAT IS NOT CONTROLLED WITH YOUR NAUSEA MEDICATION  *UNUSUAL SHORTNESS OF BREATH  *UNUSUAL BRUISING OR BLEEDING  TENDERNESS IN MOUTH AND THROAT WITH OR WITHOUT PRESENCE OF ULCERS  *URINARY PROBLEMS  *BOWEL PROBLEMS  UNUSUAL RASH Items with * indicate a potential emergency and should be followed up as soon as possible.  Feel free to call the clinic you have any questions or concerns. The clinic phone number is [phone].    

## 2014-02-28 ENCOUNTER — Other Ambulatory Visit: Payer: 59

## 2014-02-28 ENCOUNTER — Ambulatory Visit: Payer: 59 | Admitting: Nurse Practitioner

## 2014-03-01 ENCOUNTER — Ambulatory Visit (HOSPITAL_BASED_OUTPATIENT_CLINIC_OR_DEPARTMENT_OTHER): Payer: 59

## 2014-03-01 ENCOUNTER — Encounter: Payer: Self-pay | Admitting: Nurse Practitioner

## 2014-03-01 ENCOUNTER — Ambulatory Visit (HOSPITAL_BASED_OUTPATIENT_CLINIC_OR_DEPARTMENT_OTHER): Payer: 59 | Admitting: Nurse Practitioner

## 2014-03-01 ENCOUNTER — Other Ambulatory Visit (HOSPITAL_BASED_OUTPATIENT_CLINIC_OR_DEPARTMENT_OTHER): Payer: 59

## 2014-03-01 VITALS — BP 138/87 | HR 102 | Temp 98.4°F | Resp 18 | Ht 69.0 in | Wt 234.9 lb

## 2014-03-01 DIAGNOSIS — D0510 Intraductal carcinoma in situ of unspecified breast: Secondary | ICD-10-CM

## 2014-03-01 DIAGNOSIS — C50411 Malignant neoplasm of upper-outer quadrant of right female breast: Secondary | ICD-10-CM

## 2014-03-01 DIAGNOSIS — Z800 Family history of malignant neoplasm of digestive organs: Secondary | ICD-10-CM

## 2014-03-01 DIAGNOSIS — Z5111 Encounter for antineoplastic chemotherapy: Secondary | ICD-10-CM

## 2014-03-01 DIAGNOSIS — Z803 Family history of malignant neoplasm of breast: Secondary | ICD-10-CM

## 2014-03-01 LAB — COMPREHENSIVE METABOLIC PANEL (CC13)
ALBUMIN: 3.9 g/dL (ref 3.5–5.0)
ALK PHOS: 107 U/L (ref 40–150)
ALT: 24 U/L (ref 0–55)
AST: 20 U/L (ref 5–34)
Anion Gap: 10 mEq/L (ref 3–11)
BUN: 18.6 mg/dL (ref 7.0–26.0)
CO2: 25 mEq/L (ref 22–29)
Calcium: 10.1 mg/dL (ref 8.4–10.4)
Chloride: 105 mEq/L (ref 98–109)
Creatinine: 0.8 mg/dL (ref 0.6–1.1)
EGFR: >90 mL/min/{1.73_m2} (ref >90)
Glucose: 98 mg/dl (ref 70–140)
Potassium: 3.7 mEq/L (ref 3.5–5.1)
Sodium: 139 mEq/L (ref 136–145)
TOTAL PROTEIN: 7.4 g/dL (ref 6.4–8.3)
Total Bilirubin: 0.43 mg/dL (ref 0.20–1.20)

## 2014-03-01 LAB — CBC WITH DIFFERENTIAL/PLATELET
BASO%: 0.6 % (ref 0.0–2.0)
Basophils Absolute: 0 10*3/uL (ref 0.0–0.1)
EOS ABS: 0.1 10*3/uL (ref 0.0–0.5)
EOS%: 1.3 % (ref 0.0–7.0)
HEMATOCRIT: 39.7 % (ref 34.8–46.6)
HGB: 13 g/dL (ref 11.6–15.9)
LYMPH%: 12.4 % — AB (ref 14.0–49.7)
MCH: 26.5 pg (ref 25.1–34.0)
MCHC: 32.7 g/dL (ref 31.5–36.0)
MCV: 81.3 fL (ref 79.5–101.0)
MONO#: 0.2 10*3/uL (ref 0.1–0.9)
MONO%: 4.3 % (ref 0.0–14.0)
NEUT%: 81.4 % — ABNORMAL HIGH (ref 38.4–76.8)
NEUTROS ABS: 3.2 10*3/uL (ref 1.5–6.5)
Platelets: 226 10*3/uL (ref 145–400)
RBC: 4.88 10*6/uL (ref 3.70–5.45)
RDW: 17 % — ABNORMAL HIGH (ref 11.2–14.5)
WBC: 4 10*3/uL (ref 3.9–10.3)
lymph#: 0.5 10*3/uL — ABNORMAL LOW (ref 0.9–3.3)

## 2014-03-01 MED ORDER — HYDROCHLOROTHIAZIDE 25 MG PO TABS: 25.0000 mg | ORAL_TABLET | Freq: Every day | ORAL | Status: DC

## 2014-03-01 MED ORDER — PACLITAXEL CHEMO INJECTION 300 MG/50ML
65.0000 mg/m2 | Freq: Once | INTRAVENOUS | Status: AC
  Administered 2014-03-01: 144 mg via INTRAVENOUS
  Filled 2014-03-01: qty 24

## 2014-03-01 MED ORDER — DEXAMETHASONE SODIUM PHOSPHATE 20 MG/5ML IJ SOLN
12.0000 mg | Freq: Once | INTRAMUSCULAR | Status: AC
  Administered 2014-03-01: 12 mg via INTRAVENOUS

## 2014-03-01 MED ORDER — ONDANSETRON 8 MG/NS 50 ML IVPB
INTRAVENOUS | Status: AC
  Filled 2014-03-01: qty 8

## 2014-03-01 MED ORDER — DEXAMETHASONE SODIUM PHOSPHATE 20 MG/5ML IJ SOLN
INTRAMUSCULAR | Status: AC
  Filled 2014-03-01: qty 5

## 2014-03-01 MED ORDER — FAMOTIDINE IN NACL 20-0.9 MG/50ML-% IV SOLN
20.0000 mg | Freq: Once | INTRAVENOUS | Status: AC
  Administered 2014-03-01: 20 mg via INTRAVENOUS

## 2014-03-01 MED ORDER — DIPHENHYDRAMINE HCL 50 MG/ML IJ SOLN
25.0000 mg | Freq: Once | INTRAMUSCULAR | Status: AC
  Administered 2014-03-01: 25 mg via INTRAVENOUS

## 2014-03-01 MED ORDER — SODIUM CHLORIDE 0.9 % IJ SOLN
10.0000 mL | INTRAMUSCULAR | Status: DC | PRN
  Administered 2014-03-01: 10 mL
  Filled 2014-03-01: qty 10

## 2014-03-01 MED ORDER — ONDANSETRON 8 MG/50ML IVPB (CHCC)
8.0000 mg | Freq: Once | INTRAVENOUS | Status: AC
  Administered 2014-03-01: 8 mg via INTRAVENOUS

## 2014-03-01 MED ORDER — DIPHENHYDRAMINE HCL 50 MG/ML IJ SOLN
INTRAMUSCULAR | Status: AC
  Filled 2014-03-01: qty 1

## 2014-03-01 MED ORDER — FAMOTIDINE IN NACL 20-0.9 MG/50ML-% IV SOLN
INTRAVENOUS | Status: AC
Start: 2014-03-01 — End: 2014-03-01
  Filled 2014-03-01: qty 50

## 2014-03-01 MED ORDER — SODIUM CHLORIDE 0.9 % IV SOLN
Freq: Once | INTRAVENOUS | Status: AC
  Administered 2014-03-01: 16:00:00 via INTRAVENOUS

## 2014-03-01 MED ORDER — HEPARIN SOD (PORK) LOCK FLUSH 100 UNIT/ML IV SOLN
500.0000 [IU] | Freq: Once | INTRAVENOUS | Status: AC | PRN
  Administered 2014-03-01: 500 [IU]
  Filled 2014-03-01: qty 5

## 2014-03-01 NOTE — Patient Instructions (Signed)
[facility] Discharge Instructions for Patients Receiving Chemotherapy  Today you received the following chemotherapy agents: Taxol.  To help prevent nausea and vomiting after your treatment, we encourage you to take your nausea medication as prescribed.   If you develop nausea and vomiting that is not controlled by your nausea medication, call the clinic.   BELOW ARE SYMPTOMS THAT SHOULD BE REPORTED IMMEDIATELY:  *FEVER GREATER THAN 100.5 F  *CHILLS WITH OR WITHOUT FEVER  NAUSEA AND VOMITING THAT IS NOT CONTROLLED WITH YOUR NAUSEA MEDICATION  *UNUSUAL SHORTNESS OF BREATH  *UNUSUAL BRUISING OR BLEEDING  TENDERNESS IN MOUTH AND THROAT WITH OR WITHOUT PRESENCE OF ULCERS  *URINARY PROBLEMS  *BOWEL PROBLEMS  UNUSUAL RASH Items with * indicate a potential emergency and should be followed up as soon as possible.  Feel free to call the clinic you have any questions or concerns. The clinic phone number is [phone].    

## 2014-03-01 NOTE — Progress Notes (Signed)
[place]  [phone] [phone]     ID: Jennifer Frazier DOB: [birth year]  MR#: [id number]  TDV#:[id number]  Patient Care Team: Sharilyn Sites, MD as PCP - General (Family Medicine) Chauncey Cruel, MD as Consulting Physician (Oncology) Thea Silversmith, MD as Consulting Physician (Radiation Oncology) [name], MD as Consulting Physician (General Surgery)   CHIEF COMPLAINT: Estrogen receptor positive stage I breast cancer CURRENT TREATMENT: receiving adjuvant chemotherapy   BREAST CANCER HISTORY: From the original intake note:  The patient had screening mammography at [place] showing calcifications in the right breast, and was referred to the breast Center 07/06/2013 for additional views. Right diagnostic mammography confirmed a group of amorphous calcifications in the upper outer right breast measuring 1.2 cm.  Biopsy of this area 07/17/2013 showed ([id number]) ductal carcinoma in situ, high-grade, estrogen receptor 100% positive, and progesterone receptor 96% positive, both with strong staining intensity.  The patient was then referred to surgery and on 07/25/2013 underwent bilateral breast MRI. This showed a breast density category CEA. In the right breast there was an area of clumped nodular enhancement measuring 3 cm. There was also a 1.5 cm circumscribed oval mass in the medial portion of the right breast consistent with a fibroadenoma. The left breast was unremarkable, and there were no abnormal appearing lymph nodes.  On 08/08/2013 the patient underwent right lumpectomy and sentinel lymph node sampling. The pathology ([id number] was (showed, in addition to ductal carcinoma in situ, invasive ductal carcinoma, measuring 0.7 cm, grade 3, estrogen receptor 86% positive, progesterone receptor 89% positive, both with strong staining intensity, with an MIB-1 of 62%, and no HER-2 amplification. Both sentinel lymph nodes were clear.  Margins were ample.   The patient's subsequent history is as detailed below  INTERVAL HISTORY: Jennifer Frazier returns today for follow up of her breast cancer, accompanied by her mother. Today is day 1 cycle 11 of 12 planned cycles of paclitaxel. Unfortunately this past week she found out that her old job position was not held and they have hired someone into that spot. Now she is weighing other jobs offers within the company for when she returns later this month. She had her "gel polish" removed, and now she sees that there are linear white lines on all of her nail beds, and her left thumbnail is lifting from its nailbed. The nails themselves are sensitive to touch as well as the very tips of her fingers as described last week, but are no worse. She is able to perform all ADLs and there is no deficiency in her ability to grasp or grip.   REVIEW OF SYSTEMS: Ashya denies fevers, chills, nausea, vomiting, or changes in bowel or bladder habits. Her appetite is fair and she is drinking well. She denies mouth sores or rashes. She has no headaches, dizziness, or vision changes. She denies shortness of breath, chest pain, cough, or palpitations. She has just mild headaches, and continues to wean off the venlafaxine. A detailed review of systems is otherwise negative.   PAST MEDICAL HISTORY: Past Medical History  Diagnosis Date  . Fibroids     uterine fibroids  . Anemia   . Thyroid disease   . Contact lens/glasses fitting     wears contacts or glasses  . Sleep apnea     has a cpap-does not always use it  . Cancer     Breast    PAST SURGICAL HISTORY: Past Surgical History  Procedure Laterality Date  .  [place]  2012    urerine ablasion  . Eye surgery  2013    retinal tear-lazer-both  . Wisdom tooth extraction    . Breast surgery      lumpectomy 7/14  . Portacath placement Right 09/29/2013    Procedure: INSERTION PORT-A-CATH WITH ULTRA SOUND;  Surgeon: [name], MD;  Location: [place];  Service: General;  Laterality: Right;  . Port a cath revision N/A 12/19/2013    Procedure: REPOSITION PORT;  Surgeon: [name], MD;  Location: [place];  Service: General;  Laterality: N/A;    FAMILY HISTORY Family History  Problem Relation Age of Onset  . Cancer Father 46    colon  . Cancer Maternal Aunt 50    breast cancer  . Cancer Paternal Aunt 7    breast cancer  . Cancer Maternal Grandmother 84    ovarian or uterine cancer  . Cancer Paternal Grandmother     unknown primary   the patient's father died at the age of 33, been diagnosed with colon cancer at the age of 28. The patient's mother is living, currently 63 years old. The patient has one brother, no sisters. One of the patient's mother's sisters was diagnosed with breast cancer at the age of 71 in one of the patient's father's mother was diagnosed with breast cancer at the age of 5. There is no history of ovarian cancer in the family.  GYNECOLOGIC HISTORY:  Patient's last menstrual period was 11/08/2013. Menarche age 32, for the patient is GX P0. She still having regular periods. 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    ADVANCED DIRECTIVES: Not in place; at the 08/24/2013 visit the patient was given the appropriate documents to come feel notarize associate may declare healthcare power of attorney.   HEALTH MAINTENANCE: History  Substance Use Topics  . Smoking status: Never Smoker   . Smokeless tobacco: Not on file  . Alcohol Use: No     Colonoscopy:  PAP:  Bone density:  Lipid panel:  Allergies  Allergen Reactions  . Gadolinium      Desc: NAUSEA WITH MRI CONTRAST-NO VOMITING   . Tramadol Hives and Nausea Only    Current Outpatient  Prescriptions  Medication Sig Dispense Refill  . Cholecalciferol (VITAMIN D3) 3000 UNITS TABS Take 10,000 Units by mouth daily.    [name] gabapentin (NEURONTIN) 300 MG capsule Take 1 capsule (300 mg total) by mouth 3 (three) times daily. (Patient taking differently: Take 300 mg by mouth 3 (three) times daily. Pt takes this medication at bedtime 2 capsules) 30 capsule 3  . hydrochlorothiazide (HYDRODIURIL) 25 MG tablet Take 1 tablet (25 mg total) by mouth daily. 30 tablet 1  . lidocaine-prilocaine (EMLA) cream Apply 1 application topically as needed. 30 g 0  . thyroid (ARMOUR) 130 MG tablet Take 130 mg by mouth daily.    [name] venlafaxine (EFFEXOR) 37.5 MG tablet Take 1 tablet (37.5 mg total) by mouth taper from 4 doses each day to 1 dose and stop. 45 tablet 0  . vitamin B-12 (CYANOCOBALAMIN) 1000 MCG tablet Take 5,000 mcg by mouth daily.    . diphenhydramine-acetaminophen (TYLENOL PM) 25-500 MG TABS Take 1 tablet by mouth at bedtime as needed (pain/headache).    [name] HYDROcodone-acetaminophen (NORCO) 5-325  MG per tablet Take 1 tablet by mouth every 6 (six) hours as needed for moderate pain. (Patient not taking: Reported on 02/08/2014) 30 tablet 0  . ibuprofen (ADVIL,MOTRIN) 100 MG/5ML suspension Take 200 mg by mouth every 4 (four) hours as needed.    . promethazine (PHENERGAN) 25 MG tablet Take 25 mg by mouth every 6 (six) hours as needed for nausea or vomiting.    . traZODone (DESYREL) 50 MG tablet Take 1 tablet (50 mg total) by mouth at bedtime. (Patient not taking: Reported on 12/22/2013) 30 tablet 2   No current facility-administered medications for this visit.   Facility-Administered Medications Ordered in Other Visits  Medication Dose Route Frequency Provider Last Rate Last Dose  . heparin lock flush 100 unit/mL  500 Units Intracatheter Once PRN Rulon Eisenmenger, MD      . PACLitaxel (TAXOL) 144 mg in dextrose 5 % 250 mL chemo infusion (</= 9m/m2)  65 mg/m2 (Treatment Plan Actual) Intravenous Once VRulon Eisenmenger MD 274 mL/hr at 03/01/14 1635 144 mg at 03/01/14 1635  . sodium chloride 0.9 % injection 10 mL  10 mL Intracatheter PRN VRulon Eisenmenger MD        OBJECTIVE: Middle-aged [place] woman who appears stated age F38Vitals:   03/01/14 1442  BP: 138/87  Pulse: 102  Temp: 98.4 F (36.9 C)  Resp: 18     Body mass index is 34.67 kg/(m^2).    ECOG FS:1 - Symptomatic but completely ambulatory  Sclerae unicteric, pupils equal and reactive Oropharynx clear and moist-- no thrush No cervical or supraclavicular adenopathy Lungs no rales or rhonchi Heart regular rate and rhythm Abd soft, nontender, positive bowel sounds MSK no focal spinal tenderness, no upper extremity lymphedema Neuro: nonfocal, well oriented, appropriate affect Breasts: deferred Skin: warm, dry, nails as pictured below. Left thumbnail separating from nail bed  Images taken 03/01/14  Right hand   Left hand   LAB RESULTS:  CMP     Component Value Date/Time   NA 139 03/01/2014 1431   NA 131* 02/02/2014 0835   K 3.7 03/01/2014 1431   K 4.2 02/02/2014 0835   CL 101 02/02/2014 0835   CO2 25 03/01/2014 1431   CO2 24 02/02/2014 0835   GLUCOSE 98 03/01/2014 1431   GLUCOSE 93 02/02/2014 0835   BUN 18.6 03/01/2014 1431   BUN 17 02/02/2014 0835   CREATININE 0.8 03/01/2014 1431   CREATININE 0.68 02/02/2014 0835   CALCIUM 10.1 03/01/2014 1431   CALCIUM 9.4 02/02/2014 0835   PROT 7.4 03/01/2014 1431   PROT 6.7 10/12/2013 1628   ALBUMIN 3.9 03/01/2014 1431   ALBUMIN 3.3* 10/12/2013 1628   AST 20 03/01/2014 1431   AST 15 10/12/2013 1628   ALT 24 03/01/2014 1431   ALT 16 10/12/2013 1628   ALKPHOS 107 03/01/2014 1431   ALKPHOS 93 10/12/2013 1628   BILITOT 0.43 03/01/2014 1431   BILITOT 0.4 10/12/2013 1628   GFRNONAA >90 02/02/2014 0835   GFRAA >90 02/02/2014 0835    I No results found for: SPEP  Lab Results  Component Value Date   WBC 4.0 03/01/2014   NEUTROABS 3.2 03/01/2014   HGB 13.0  03/01/2014   HCT 39.7 03/01/2014   MCV 81.3 03/01/2014   PLT 226 03/01/2014      Chemistry      Component Value Date/Time   NA 139 03/01/2014 1431   NA 131* 02/02/2014 0835   K 3.7 03/01/2014 1431  K 4.2 02/02/2014 0835   CL 101 02/02/2014 0835   CO2 25 03/01/2014 1431   CO2 24 02/02/2014 0835   BUN 18.6 03/01/2014 1431   BUN 17 02/02/2014 0835   CREATININE 0.8 03/01/2014 1431   CREATININE 0.68 02/02/2014 0835      Component Value Date/Time   CALCIUM 10.1 03/01/2014 1431   CALCIUM 9.4 02/02/2014 0835   ALKPHOS 107 03/01/2014 1431   ALKPHOS 93 10/12/2013 1628   AST 20 03/01/2014 1431   AST 15 10/12/2013 1628   ALT 24 03/01/2014 1431   ALT 16 10/12/2013 1628   BILITOT 0.43 03/01/2014 1431   BILITOT 0.4 10/12/2013 1628       No results found for: LABCA2  No components found for: LABCA125  No results for input(s): INR in the last 168 hours.  Urinalysis    Component Value Date/Time   COLORURINE YELLOW 02/02/2014 0845   APPEARANCEUR CLEAR 02/02/2014 0845   LABSPEC 1.009 02/02/2014 0845   PHURINE 6.0 02/02/2014 0845   GLUCOSEU NEGATIVE 02/02/2014 0845   HGBUR NEGATIVE 02/02/2014 0845   BILIRUBINUR NEGATIVE 02/02/2014 0845   KETONESUR NEGATIVE 02/02/2014 0845   PROTEINUR NEGATIVE 02/02/2014 0845   UROBILINOGEN 0.2 02/02/2014 0845   NITRITE NEGATIVE 02/02/2014 0845   LEUKOCYTESUR TRACE* 02/02/2014 0845    STUDIES: No results found.  ASSESSMENT: 40 y.o. BRCA negative [place] woman status post right breast biopsy 07/17/2013 for high-grade ductal carcinoma in situ, estrogen and progesterone receptor positive  (1) status post right lumpectomy and sentinel lymph node sampling 08/08/2013 for a pT1b pN0, stage IA invasive ductal carcinoma, grade 3, estrogen and progesterone receptor positive, with an MIB-1 of 62% and no HER-2 amplification  (2) genetics sent 08/02/2013 was normal but did identify a variant of uncertain significance called BRCA2, [id number].  (3)  Oncotype score of 23 predicts a risk of outside the breast recurrence within 10 years of 15% if the patient's only systemic treatment is tamoxifen for 5 years. Adjuvant chemotherapy was recommended  (4) doxorubicin and cyclophosphamide in dose dense fashion x4 with Neulasta support to start 10/05/2013, to be followed by paclitaxel weekly x12  (5) adjuvant radiation to follow chemotherapy  (6) anti-estrogens for 5-10 years to follow radiation  (7) "flipped port:" The port head in Caylei's anterior chest appeared unusually mobile. Port replaced on 12/19/13  PLAN: Jesika continues to manage treatment well. The labs were reviewed in detail and were stable. She will proceed with cycle 11 of palcitaxel today.   Philana will have radiation after her chemotherapy is completed, so I have place orders for a consult with Dr. Pablo Ledger. She also requested that I consult the nutritionist. She has gained 15lb since the start of chemotherapy and wants guidance on her diet.   I advised Aishani to massage her nailbeds with tea tree oil and to discontinue the use of manicures in the future until healthy nails grow in.   Reily will return next week for labs, a follow up visit, and cycle 11 of paclitaxel. She understands and agrees with this plan. She knows the goal of treatment in her case is cure. She has been encouraged to call with any issues that might arise before her next visit here.     Marcelino Duster, NP   03/01/2014 4:44 PM

## 2014-03-02 ENCOUNTER — Telehealth: Payer: Self-pay | Admitting: Oncology

## 2014-03-02 NOTE — Telephone Encounter (Signed)
Left message to confirm Nutrition appointment.

## 2014-03-08 ENCOUNTER — Other Ambulatory Visit (HOSPITAL_BASED_OUTPATIENT_CLINIC_OR_DEPARTMENT_OTHER): Payer: 59

## 2014-03-08 ENCOUNTER — Ambulatory Visit (HOSPITAL_BASED_OUTPATIENT_CLINIC_OR_DEPARTMENT_OTHER): Payer: 59

## 2014-03-08 ENCOUNTER — Encounter: Payer: Self-pay | Admitting: Nurse Practitioner

## 2014-03-08 ENCOUNTER — Ambulatory Visit (HOSPITAL_BASED_OUTPATIENT_CLINIC_OR_DEPARTMENT_OTHER): Payer: 59 | Admitting: Nurse Practitioner

## 2014-03-08 VITALS — BP 127/84 | HR 91 | Temp 97.8°F | Resp 19 | Ht 69.0 in | Wt 236.9 lb

## 2014-03-08 DIAGNOSIS — C50411 Malignant neoplasm of upper-outer quadrant of right female breast: Secondary | ICD-10-CM

## 2014-03-08 DIAGNOSIS — Z170 Estrogen receptor positive status [ER+]: Secondary | ICD-10-CM

## 2014-03-08 DIAGNOSIS — D0510 Intraductal carcinoma in situ of unspecified breast: Secondary | ICD-10-CM

## 2014-03-08 DIAGNOSIS — Z803 Family history of malignant neoplasm of breast: Secondary | ICD-10-CM

## 2014-03-08 DIAGNOSIS — Z800 Family history of malignant neoplasm of digestive organs: Secondary | ICD-10-CM

## 2014-03-08 DIAGNOSIS — Z5111 Encounter for antineoplastic chemotherapy: Secondary | ICD-10-CM

## 2014-03-08 LAB — CBC WITH DIFFERENTIAL/PLATELET
BASO%: 0.7 % (ref 0.0–2.0)
BASOS ABS: 0 10*3/uL (ref 0.0–0.1)
EOS%: 1.1 % (ref 0.0–7.0)
Eosinophils Absolute: 0 10*3/uL (ref 0.0–0.5)
HCT: 39.6 % (ref 34.8–46.6)
HGB: 12.7 g/dL (ref 11.6–15.9)
LYMPH%: 25.9 % (ref 14.0–49.7)
MCH: 26.2 pg (ref 25.1–34.0)
MCHC: 32 g/dL (ref 31.5–36.0)
MCV: 81.9 fL (ref 79.5–101.0)
MONO#: 0.2 10*3/uL (ref 0.1–0.9)
MONO%: 5.9 % (ref 0.0–14.0)
NEUT%: 66.4 % (ref 38.4–76.8)
NEUTROS ABS: 2 10*3/uL (ref 1.5–6.5)
Platelets: 261 10*3/uL (ref 145–400)
RBC: 4.83 10*6/uL (ref 3.70–5.45)
RDW: 17.2 % — AB (ref 11.2–14.5)
WBC: 3 10*3/uL — AB (ref 3.9–10.3)
lymph#: 0.8 10*3/uL — ABNORMAL LOW (ref 0.9–3.3)

## 2014-03-08 LAB — COMPREHENSIVE METABOLIC PANEL (CC13)
ALBUMIN: 3.7 g/dL (ref 3.5–5.0)
ALT: 13 U/L (ref 0–55)
AST: 15 U/L (ref 5–34)
Alkaline Phosphatase: 93 U/L (ref 40–150)
Anion Gap: 8 mEq/L (ref 3–11)
BUN: 25.2 mg/dL (ref 7.0–26.0)
CO2: 29 mEq/L (ref 22–29)
Calcium: 10.3 mg/dL (ref 8.4–10.4)
Chloride: 104 mEq/L (ref 98–109)
Creatinine: 0.8 mg/dL (ref 0.6–1.1)
EGFR: >90 mL/min/{1.73_m2} (ref >90)
Glucose: 91 mg/dl (ref 70–140)
POTASSIUM: 3.8 meq/L (ref 3.5–5.1)
SODIUM: 142 meq/L (ref 136–145)
Total Bilirubin: 0.41 mg/dL (ref 0.20–1.20)
Total Protein: 7.2 g/dL (ref 6.4–8.3)

## 2014-03-08 MED ORDER — DEXAMETHASONE SODIUM PHOSPHATE 20 MG/5ML IJ SOLN
INTRAMUSCULAR | Status: AC
  Filled 2014-03-08: qty 5

## 2014-03-08 MED ORDER — SODIUM CHLORIDE 0.9 % IJ SOLN
10.0000 mL | INTRAMUSCULAR | Status: DC | PRN
  Administered 2014-03-08: 10 mL
  Filled 2014-03-08: qty 10

## 2014-03-08 MED ORDER — ONDANSETRON 8 MG/50ML IVPB (CHCC)
8.0000 mg | Freq: Once | INTRAVENOUS | Status: AC
  Administered 2014-03-08: 8 mg via INTRAVENOUS

## 2014-03-08 MED ORDER — DIPHENHYDRAMINE HCL 50 MG/ML IJ SOLN
INTRAMUSCULAR | Status: AC
  Filled 2014-03-08: qty 1

## 2014-03-08 MED ORDER — ONDANSETRON 8 MG/NS 50 ML IVPB
INTRAVENOUS | Status: AC
  Filled 2014-03-08: qty 8

## 2014-03-08 MED ORDER — PACLITAXEL CHEMO INJECTION 300 MG/50ML
65.0000 mg/m2 | Freq: Once | INTRAVENOUS | Status: AC
  Administered 2014-03-08: 144 mg via INTRAVENOUS
  Filled 2014-03-08: qty 24

## 2014-03-08 MED ORDER — SODIUM CHLORIDE 0.9 % IV SOLN
Freq: Once | INTRAVENOUS | Status: AC
  Administered 2014-03-08: 13:00:00 via INTRAVENOUS

## 2014-03-08 MED ORDER — HEPARIN SOD (PORK) LOCK FLUSH 100 UNIT/ML IV SOLN
500.0000 [IU] | Freq: Once | INTRAVENOUS | Status: AC | PRN
  Administered 2014-03-08: 500 [IU]
  Filled 2014-03-08: qty 5

## 2014-03-08 MED ORDER — DEXAMETHASONE SODIUM PHOSPHATE 20 MG/5ML IJ SOLN
12.0000 mg | Freq: Once | INTRAMUSCULAR | Status: AC
  Administered 2014-03-08: 12 mg via INTRAVENOUS

## 2014-03-08 MED ORDER — FAMOTIDINE IN NACL 20-0.9 MG/50ML-% IV SOLN
INTRAVENOUS | Status: AC
  Filled 2014-03-08: qty 50

## 2014-03-08 MED ORDER — FAMOTIDINE IN NACL 20-0.9 MG/50ML-% IV SOLN
20.0000 mg | Freq: Once | INTRAVENOUS | Status: AC
  Administered 2014-03-08: 20 mg via INTRAVENOUS

## 2014-03-08 MED ORDER — DIPHENHYDRAMINE HCL 50 MG/ML IJ SOLN
25.0000 mg | Freq: Once | INTRAMUSCULAR | Status: AC
  Administered 2014-03-08: 25 mg via INTRAVENOUS

## 2014-03-08 NOTE — Progress Notes (Signed)
[place]  [phone] [phone]     ID: [name] DOB: 12-19-1975  MR#: [id number]  SNK#:[id number]  Patient Care Team: Sharilyn Sites, MD as PCP - General (Family Medicine) Chauncey Cruel, MD as Consulting Physician (Oncology) Thea Silversmith, MD as Consulting Physician (Radiation Oncology) [name], MD as Consulting Physician (General Surgery)   CHIEF COMPLAINT: Estrogen receptor positive stage I breast cancer CURRENT TREATMENT: receiving adjuvant chemotherapy   BREAST CANCER HISTORY: From the original intake note:  The patient had screening mammography at [facility] showing calcifications in the right breast, and was referred to the breast Center 07/06/2013 for additional views. Right diagnostic mammography confirmed a group of amorphous calcifications in the upper outer right breast measuring 1.2 cm.  Biopsy of this area 07/17/2013 showed ([id number]) ductal carcinoma in situ, high-grade, estrogen receptor 100% positive, and progesterone receptor 96% positive, both with strong staining intensity.  The patient was then referred to surgery and on 07/25/2013 underwent bilateral breast MRI. This showed a breast density category CEA. In the right breast there was an area of clumped nodular enhancement measuring 3 cm. There was also a 1.5 cm circumscribed oval mass in the medial portion of the right breast consistent with a fibroadenoma. The left breast was unremarkable, and there were no abnormal appearing lymph nodes.  On 08/08/2013 the patient underwent right lumpectomy and sentinel lymph node sampling. The pathology ([id number] was (showed, in addition to ductal carcinoma in situ, invasive ductal carcinoma, measuring 0.7 cm, grade 3, estrogen receptor 86% positive, progesterone receptor 89% positive, both with strong staining intensity, with an MIB-1 of 62%, and no HER-2 amplification. Both sentinel lymph nodes were clear.  Margins were ample.   The patient's subsequent history is as detailed below  INTERVAL HISTORY: Jennifer Frazier returns today for follow up of her breast cancer, accompanied by her mother. Today is day 1 cycle 12 of 12 planned cycles of paclitaxel. She is doing well this week, and is excited to complete chemotherapy today. The "softness" to her fingertips has actually improved and the markings to her nailbeds are not as bad as previously pictured.   REVIEW OF SYSTEMS: Jennifer Frazier denies fevers, chills, nausea, vomiting, or changes in bowel or bladder habits. Her appetite is fair and she is drinking well. She denies mouth sores or rashes. She has no headaches, dizziness, or vision changes. She denies shortness of breath, chest pain, cough, or palpitations. She has just mild headaches, and continues to wean off the venlafaxine. She takes 300-644m QHS for her hot flashes. A detailed review of systems is otherwise negative.   PAST MEDICAL HISTORY: Past Medical History  Diagnosis Date  . Fibroids     uterine fibroids  . Anemia   . Thyroid disease   . Contact lens/glasses fitting     wears contacts or glasses  . Sleep apnea     has a cpap-does not always use it  . Cancer     Breast    PAST SURGICAL HISTORY: Past Surgical History  Procedure Laterality Date  . [place] 2012    urerine ablasion  . Eye surgery  2013    retinal tear-lazer-both  . Wisdom tooth extraction    . Breast surgery      lumpectomy 7/14  . Portacath placement Right 09/29/2013    Procedure: INSERTION PORT-A-CATH WITH ULTRA SOUND;  Surgeon: TErroll Luna MD;  [place] [facility]  Service: General;  Laterality: Right;  .  Port a cath revision N/A 12/19/2013    Procedure: REPOSITION PORT;  Surgeon: [name], MD;  Location: [facility];  Service: General;  Laterality: N/A;    FAMILY HISTORY Family History  Problem Relation Age of Onset  . Cancer Father 60    colon  . Cancer Maternal Aunt 50     breast cancer  . Cancer Paternal Aunt 41    breast cancer  . Cancer Maternal Grandmother 37    ovarian or uterine cancer  . Cancer Paternal Grandmother     unknown primary   the patient's father died at the age of 52, been diagnosed with colon cancer at the age of 99. The patient's mother is living, currently 74 years old. The patient has one brother, no sisters. One of the patient's mother's sisters was diagnosed with breast cancer at the age of 63 in one of the patient's father's mother was diagnosed with breast cancer at the age of 68. There is no history of ovarian cancer in the family.  GYNECOLOGIC HISTORY:  No LMP recorded. Menarche age 39, for the patient is GX P0. She still having regular periods. 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    ADVANCED DIRECTIVES: Not in place; at the 08/24/2013 visit the patient was given the appropriate documents to come feel notarize associate may declare healthcare power of attorney.   HEALTH MAINTENANCE: History  Substance Use Topics  . Smoking status: Never Smoker   . Smokeless tobacco: Not on file  . Alcohol Use: No     Colonoscopy:  PAP:  Bone density:  Lipid panel:  Allergies  Allergen Reactions  . Gadolinium      Desc: NAUSEA WITH MRI CONTRAST-NO VOMITING   . Tramadol Hives and Nausea Only    Current Outpatient Prescriptions  Medication Sig Dispense Refill  . Cholecalciferol (VITAMIN D3) 3000 UNITS TABS Take 10,000 Units by mouth daily.    . diphenhydramine-acetaminophen (TYLENOL PM) 25-500 MG TABS Take 1 tablet by mouth at bedtime as needed (pain/headache).    . gabapentin (NEURONTIN) 300 MG capsule Take 1 capsule (300 mg total) by mouth 3 (three) times daily. (Patient taking differently:  Take 300 mg by mouth 3 (three) times daily. Pt takes this medication at bedtime 2 capsules) 30 capsule 3  . hydrochlorothiazide (HYDRODIURIL) 25 MG tablet Take 1 tablet (25 mg total) by mouth daily. 30 tablet 1  . HYDROcodone-acetaminophen (NORCO) 5-325 MG per tablet Take 1 tablet by mouth every 6 (six) hours as needed for moderate pain. 30 tablet 0  . ibuprofen (ADVIL,MOTRIN) 100 MG/5ML suspension Take 200 mg by mouth every 4 (four) hours as needed.    . lidocaine-prilocaine (EMLA) cream Apply 1 application topically as needed. 30 g 0  . promethazine (PHENERGAN) 25 MG tablet Take 25 mg by mouth every 6 (six) hours as needed for nausea or vomiting.    . thyroid (ARMOUR) 130 MG tablet Take 130 mg by mouth daily.    . traZODone (DESYREL) 50 MG tablet Take 1 tablet (50 mg total) by mouth at bedtime. 30 tablet 2  . venlafaxine (EFFEXOR) 37.5 MG tablet Take 1 tablet (37.5 mg total) by mouth taper from 4 doses each day to 1 dose and stop. 45 tablet 0  . venlafaxine XR (EFFEXOR-XR) 75  MG 24 hr capsule   0  . vitamin B-12 (CYANOCOBALAMIN) 1000 MCG tablet Take 5,000 mcg by mouth daily.     No current facility-administered medications for this visit.    OBJECTIVE: Middle-aged [place] American woman who appears stated age 69 Vitals:   03/08/14 1149  BP: 127/84  Pulse: 91  Temp: 97.8 F (36.6 C)  Resp: 19     Body mass index is 34.97 kg/(m^2).    ECOG FS:1 - Symptomatic but completely ambulatory  Skin: warm, dry, white horizontal fingernail damage to nail beds. Left thumbnail separating from nailbed HEENT: sclerae anicteric, conjunctivae pink, oropharynx clear. No thrush or mucositis.  Lymph Nodes: No cervical or supraclavicular lymphadenopathy  Lungs: clear to auscultation bilaterally, no rales, wheezes, or rhonci  Heart: regular rate and rhythm  Abdomen: round, soft, non tender, positive bowel sounds  Musculoskeletal: No focal spinal tenderness, no peripheral edema  Neuro: non focal, well  oriented, positive affect  Breast: deferred   LAB RESULTS:  CMP     Component Value Date/Time   NA 139 03/01/2014 1431   NA 131* 02/02/2014 0835   K 3.7 03/01/2014 1431   K 4.2 02/02/2014 0835   CL 101 02/02/2014 0835   CO2 25 03/01/2014 1431   CO2 24 02/02/2014 0835   GLUCOSE 98 03/01/2014 1431   GLUCOSE 93 02/02/2014 0835   BUN 18.6 03/01/2014 1431   BUN 17 02/02/2014 0835   CREATININE 0.8 03/01/2014 1431   CREATININE 0.68 02/02/2014 0835   CALCIUM 10.1 03/01/2014 1431   CALCIUM 9.4 02/02/2014 0835   PROT 7.4 03/01/2014 1431   PROT 6.7 10/12/2013 1628   ALBUMIN 3.9 03/01/2014 1431   ALBUMIN 3.3* 10/12/2013 1628   AST 20 03/01/2014 1431   AST 15 10/12/2013 1628   ALT 24 03/01/2014 1431   ALT 16 10/12/2013 1628   ALKPHOS 107 03/01/2014 1431   ALKPHOS 93 10/12/2013 1628   BILITOT 0.43 03/01/2014 1431   BILITOT 0.4 10/12/2013 1628   GFRNONAA >90 02/02/2014 0835   GFRAA >90 02/02/2014 0835    I No results found for: SPEP  Lab Results  Component Value Date   WBC 3.0* 03/08/2014   NEUTROABS 2.0 03/08/2014   HGB 12.7 03/08/2014   HCT 39.6 03/08/2014   MCV 81.9 03/08/2014   PLT 261 03/08/2014      Chemistry      Component Value Date/Time   NA 139 03/01/2014 1431   NA 131* 02/02/2014 0835   K 3.7 03/01/2014 1431   K 4.2 02/02/2014 0835   CL 101 02/02/2014 0835   CO2 25 03/01/2014 1431   CO2 24 02/02/2014 0835   BUN 18.6 03/01/2014 1431   BUN 17 02/02/2014 0835   CREATININE 0.8 03/01/2014 1431   CREATININE 0.68 02/02/2014 0835      Component Value Date/Time   CALCIUM 10.1 03/01/2014 1431   CALCIUM 9.4 02/02/2014 0835   ALKPHOS 107 03/01/2014 1431   ALKPHOS 93 10/12/2013 1628   AST 20 03/01/2014 1431   AST 15 10/12/2013 1628   ALT 24 03/01/2014 1431   ALT 16 10/12/2013 1628   BILITOT 0.43 03/01/2014 1431   BILITOT 0.4 10/12/2013 1628       No results found for: LABCA2  No components found for: LABCA125  No results for input(s): INR in the  last 168 hours.  Urinalysis    Component Value Date/Time   COLORURINE YELLOW 02/02/2014 0845   APPEARANCEUR CLEAR 02/02/2014 0845   LABSPEC  1.009 02/02/2014 0845   PHURINE 6.0 02/02/2014 0845   GLUCOSEU NEGATIVE 02/02/2014 0845   HGBUR NEGATIVE 02/02/2014 0845   BILIRUBINUR NEGATIVE 02/02/2014 0845   KETONESUR NEGATIVE 02/02/2014 0845   PROTEINUR NEGATIVE 02/02/2014 0845   UROBILINOGEN 0.2 02/02/2014 0845   NITRITE NEGATIVE 02/02/2014 0845   LEUKOCYTESUR TRACE* 02/02/2014 0845    STUDIES: No results found.  ASSESSMENT: 39 y.o. BRCA negative [place] woman status post right breast biopsy 07/17/2013 for high-grade ductal carcinoma in situ, estrogen and progesterone receptor positive  (1) status post right lumpectomy and sentinel lymph node sampling 08/08/2013 for a pT1b pN0, stage IA invasive ductal carcinoma, grade 3, estrogen and progesterone receptor positive, with an MIB-1 of 62% and no HER-2 amplification  (2) genetics sent 08/02/2013 was normal but did identify a variant of uncertain significance called BRCA2, [id number].  (3) Oncotype score of 23 predicts a risk of outside the breast recurrence within 10 years of 15% if the patient's only systemic treatment is tamoxifen for 5 years. Adjuvant chemotherapy was recommended  (4) doxorubicin and cyclophosphamide in dose dense fashion x4 with Neulasta support to start 10/05/2013, to be followed by paclitaxel weekly x12  (5) adjuvant radiation to follow chemotherapy  (6) anti-estrogens for 5-10 years to follow radiation  (7) "flipped port:" The port head in Jennifer Frazier's anterior chest appeared unusually mobile. Port replaced on 12/19/13  PLAN: Jennifer Frazier is ready to complete chemotherapy today. The labs were reviewed in detail and were entirely stable. She will proceed with cycle 12 of paclitaxel today.   Her nailbeds are improving somewhat. I advised her to soak them in vinegar water or tea tree oil.  She is scheduled to meet with Dr.  Pablo Ledger next week regarding initiating radiation next month. Jennifer Frazier will return in 4 weeks for a follow up visit with Dr. Jana Hakim. She understands and agrees with this plan. She knows the goal of treatment in her case is cure. She has been encouraged to call with any issues that might arise before her next visit here.    Marcelino Duster, NP   03/08/2014 12:04 PM

## 2014-03-08 NOTE — Patient Instructions (Signed)
[facility] Discharge Instructions for Patients Receiving Chemotherapy  Today you received the following chemotherapy agents Taxol  To help prevent nausea and vomiting after your treatment, we encourage you to take your nausea medication    If you develop nausea and vomiting that is not controlled by your nausea medication, call the clinic.   BELOW ARE SYMPTOMS THAT SHOULD BE REPORTED IMMEDIATELY:  *FEVER GREATER THAN 100.5 F  *CHILLS WITH OR WITHOUT FEVER  NAUSEA AND VOMITING THAT IS NOT CONTROLLED WITH YOUR NAUSEA MEDICATION  *UNUSUAL SHORTNESS OF BREATH  *UNUSUAL BRUISING OR BLEEDING  TENDERNESS IN MOUTH AND THROAT WITH OR WITHOUT PRESENCE OF ULCERS  *URINARY PROBLEMS  *BOWEL PROBLEMS  UNUSUAL RASH Items with * indicate a potential emergency and should be followed up as soon as possible.  Feel free to call the clinic you have any questions or concerns. The clinic phone number is [phone].    

## 2014-03-14 ENCOUNTER — Encounter: Payer: Self-pay | Admitting: Radiation Oncology

## 2014-03-14 ENCOUNTER — Ambulatory Visit
Admission: RE | Admit: 2014-03-14 | Discharge: 2014-03-14 | Disposition: A | Discharge status: Home / Self Care | Payer: 59 | Source: Ambulatory Visit | Attending: Radiation Oncology | Admitting: Radiation Oncology

## 2014-03-14 ENCOUNTER — Ambulatory Visit: Payer: 59 | Admitting: Nutrition

## 2014-03-14 VITALS — BP 125/89 | HR 102 | Temp 98.0°F | Resp 20 | Ht 69.0 in | Wt 239.0 lb

## 2014-03-14 DIAGNOSIS — Z510 Encounter for antineoplastic radiation therapy: Secondary | ICD-10-CM | POA: Diagnosis not present

## 2014-03-14 DIAGNOSIS — G629 Polyneuropathy, unspecified: Secondary | ICD-10-CM | POA: Insufficient documentation

## 2014-03-14 DIAGNOSIS — Z170 Estrogen receptor positive status [ER+]: Secondary | ICD-10-CM | POA: Insufficient documentation

## 2014-03-14 DIAGNOSIS — Z6835 Body mass index (BMI) 35.0-35.9, adult: Secondary | ICD-10-CM | POA: Diagnosis not present

## 2014-03-14 DIAGNOSIS — Z9221 Personal history of antineoplastic chemotherapy: Secondary | ICD-10-CM | POA: Insufficient documentation

## 2014-03-14 DIAGNOSIS — C50411 Malignant neoplasm of upper-outer quadrant of right female breast: Secondary | ICD-10-CM

## 2014-03-14 DIAGNOSIS — R635 Abnormal weight gain: Secondary | ICD-10-CM | POA: Insufficient documentation

## 2014-03-14 DIAGNOSIS — L599 Disorder of the skin and subcutaneous tissue related to radiation, unspecified: Secondary | ICD-10-CM | POA: Diagnosis not present

## 2014-03-14 NOTE — Progress Notes (Signed)
39 year old female diagnosed with stage I breast cancer.  She is a patient of Dr. Jana Hakim.    Past medical history includes anemia, thyroid disease, and sleep apnea.  Medications include vitamin D, Phenergan, Armour Thyroid, Effexor and vitamin B12.  Height: 69 inches. Weight: 239 pounds February 17. Usual body weight: 225 pounds. BMI: 35.28.  Patient requested RD consult to assist her with weight loss.  Patient reports foods that used to satisfy her no longer do.  Patient has followed a paleo diet and reports she went from 255 pounds down to 218 pounds over 6 months.  She now complains of taste alterations, which do not allow her to enjoy the foods that she used to consume.  Dietary recall reveals patient consumes a very low calorie diet consisting of a protein bar for breakfast, yogurt and fruit for lunch, and protein and salad for dinner.  Patient has completed chemotherapy.  Will begin radiation treatments.  Nutrition diagnosis:  Food and nutrition related knowledge deficit related to breast cancer and associated treatments as evidenced by no prior need for nutrition related information.  Intervention:  Educated patient to consume primarily a plant-based diet with lean proteins while monitoring portion control.   Recommended patient distribute calories and macronutrients equally throughout 3 meals daily. Recommended patient keep a food journal with total intake and feelings of satiety.   Recommended patient begin exercise program with physician's permission to include aerobic activity and weight training.   Educated patient to strive for 150 minutes weekly with a gradual increase to 300 minutes weekly. Provided sample menus in fact sheets to assist patient. Questions were answered.  Teach back method used.  Contact information was provided.  Monitoring, evaluation, goals: Patient will work to increase activity and incorporate low-fat, calorie-controlled plant-based diet to achieve safe  weight loss.  No follow-up required.    **Disclaimer: This note was dictated with voice recognition software. Similar sounding words can inadvertently be transcribed and this note may contain transcription errors which may not have been corrected upon publication of note.**

## 2014-03-14 NOTE — Progress Notes (Signed)
Please see the Nurse Progress Note in the MD Initial Consult Encounter for this patient. 

## 2014-03-14 NOTE — Progress Notes (Signed)
  Location of Breast Cancer:Right breast Invasive ductal carcinoma right upper quadrant 10 o'clock  Histology per Pathology Report: 08/08/2013 Diagnosis 1. Breast, lumpectomy, Right - INVASIVE DUCTAL CARCINOMA, SEE COMMENT. - NEGATIVE FOR LYMPH VASCULAR INVASION. - INVASIVE TUMOR IS 1.2 CM FROM THE NEAREST MARGIN (LATERAL). - HIGH GRADE DUCTAL CARCINOMA IN SITU. - PREVIOUS BIOPSY SITE. - SEE TUMOR SYNOPTIC TEMPLATE BELOW. 2. Breast, excision, Right anterior additional margin - BENIGN BREAST TISSUE, SEE COMMENT. - NEGATIVE FOR ATYPIA OR MALIGNANCY. - SURGICAL MARGIN, NEGATIVE FOR ATYPIA OR MALIGNANCY. 3. Breast, excision, Right posterior additional margin - BENIGN BREAST TISSUE, SEE COMMENT. - NEGATIVE FOR ATYPIA OR MALIGNANCY. - SURGICAL MARGIN, NEGATIVE FOR ATYPIA OR MALIGNANCY. 4. Breast, excision, Right superior additional margin - BENIGN BREAST TISSUE, SEE COMMENT. - NEGATIVE FOR ATYPIA OR MALIGNANCY. - SURGICAL MARGINS, NEGATIVE FOR ATYPIA OR MALIGNANCY. 5. Breast, excision, Right inferior additional margin - BENIGN BREAST TISSUE, SEE COMMENT. - NEGATIVE FOR ATYPIA OR MALIGNANCY. - SURGICAL MARGIN, NEGATIVE FOR ATYPIA OR MALIGNANCY. 6. Breast, excision, Right medial additional margin - BENIGN BREAST TISSUE, SEE COMMENT. - NEGATIVE FOR ATYPIA OR MALIGNANCY. - SURGICAL MARGIN, NEGATIVE FOR ATYPIA OR MALIGNANCY. 7. Breast, excision, Right lateral additional margin - DUCTAL CARCINOMA IN SITU, SEE COMMENT. - IN SITU CARCINOMA IS 6 MM FROM THE NEAREST MARGIN. 8. Lymph node, sentinel, biopsy, Right axillary - ONE LYMPH NODE, NEGATIVE FOR TUMOR (0/1). 9. Lymph node, sentinel, biopsy, right axillary - ONE LYMPH NODE, NEGATIVE FOR TUMOR (0/1).  07/17/2013 Diagnosis Breast, right, needle core biopsy, 10 o'clock, posterior - HIGH GRADE DUCTAL CARCINOMA IN SITU WITH COMEDO-TYPE NECROSIS. SEE COMMENT.  Receptor Status: ER(+), PR (+), Her2-neu ( no amplification)  Did patient  present with symptoms (if so, please note symptoms) or was this found on screening mammography?: Screening mamography performed at [facility]  Past/Anticipated interventions by surgeon, if any:08/08/2013 RADIOACTIVE SEED GUIDED PARTIAL MASTECTOMY WITH AXILLARY SENTINEL LYMPH NODE BIOPSY by Dr.Thomas Cornett.  Past/Anticipated interventions by medical oncology, if any: Chemotherapy : 4) doxorubicin and cyclophosphamide in dose dense fashion x4 with Neulasta support to start 10/05/2013, to be followed by paclitaxel weekly x12,  adjuvant radiation to follow chemotherapy,  anti-estrogens for 5-10 years to follow radiation.  Last chemo on 03/08/14.   Lymphedema issues, if any:  No  Pain issues, if any: No   SAFETY ISSUES:  Prior radiation? No  Pacemaker/ICD? No  Possible current pregnancy? No last menstrual period 08/07/13  Is the patient on methotrexate? No  Current Complaints / other details: Single, Menarche age 6, GX PO. Last menstrual period 08/07/13.  Allergies:Galladium and tramadol Maternal aunt diagnosed with breast cancer in her 53's. Numbness , tingling in finger tips, toes, bottom of feet

## 2014-03-14 NOTE — Progress Notes (Signed)
   Department of Radiation Oncology  Phone:  [phone] Fax:        [phone]   Name: Jennifer Frazier MRN: [id number]  DOB: 1975-05-29  Date: 03/14/2014  Follow Up Visit Note  Diagnosis: Breast cancer of upper-outer quadrant of right female breast   Staging form: Breast, AJCC 7th Edition     Pathologic: Stage IA (T1b, N0, cM0) - Signed by Thea Silversmith, MD on 08/30/2013  Interval History: Jennifer Frazier presents today for routine followup.  She finished her chemotherapy. She is struggling with fear of recurrence. She has researched the "short MRI" offered by [facility] and would like to discuss that as an option for monitoring for recurrence due to her breast density. She tolerated this well with minimal other side effects. She had to have her portacath revised twice due to rotation. She is concerned about weight gain with chemotherapy. She has minimal neuropathy. She is accompanied by her mother.   Physical Exam:  Filed Vitals:   03/14/14 1441  BP: 125/89  Pulse: 102  Temp: 98 F (36.7 C)  Resp: 20  Height: 5\' 9"  (1.753 m)  Weight: 239 lb (108.41 kg)   Pleasant female. No distress. Incision well healed. Portacath on the right side.   IMPRESSION: Jennifer Frazier is a 39 y.o. female s/p lumpectomy.   PLAN:  I spoke to the patient today regarding her diagnosis and options for treatment. We discussed the role of radiation in decreasing local failures in patients who undergo lumpectomy. We discussed the process of simulation and the placement tattoos. We discussed 6 weeks of treatment as an outpatient. We discussed the possibility of asymptomatic lung damage. We discussed the low likelihood of secondary malignancies. We discussed the possible side effects including but not limited to skin redness, fatigue, permanent skin darkening, and breast swelling.   I encouraged her to join our young women's support group. We discussed fear of recurrence and screening for recurrence. We discussed breast density and  the pros and cons of MRI for screening.   She signed informed consent. I scheduled her for simulation the first week of March and to start the following week. She understands that she will require port flushes and as her cancer is on the right side, she may need 4-6 months after radiation to heal before proceeding forward with removal.    Thea Silversmith, MD

## 2014-03-19 NOTE — Addendum Note (Signed)
Encounter addended by: Arlyss Repress, RN on: 03/19/2014 12:44 PM<BR>     Documentation filed: Charges VN

## 2014-03-19 NOTE — Addendum Note (Signed)
Encounter addended by: Deirdre Evener, RN on: 03/19/2014  1:23 PM<BR>     Documentation filed: Charges VN

## 2014-03-27 ENCOUNTER — Ambulatory Visit
Admission: RE | Admit: 2014-03-27 | Discharge: 2014-03-27 | Disposition: A | Discharge status: Home / Self Care | Payer: 59 | Source: Ambulatory Visit | Attending: Radiation Oncology | Admitting: Radiation Oncology

## 2014-03-27 DIAGNOSIS — Z510 Encounter for antineoplastic radiation therapy: Secondary | ICD-10-CM | POA: Diagnosis not present

## 2014-03-27 DIAGNOSIS — C50411 Malignant neoplasm of upper-outer quadrant of right female breast: Secondary | ICD-10-CM

## 2014-03-27 NOTE — Progress Notes (Signed)
Radiation Oncology         [phone] ________________________________  Name: Jennifer Frazier      MRN: [id number]          Date: 03/27/2014              DOB: 1975-05-27  Optical Surface Tracking Plan:  Since intensity modulated radiotherapy (IMRT) and 3D conformal radiation treatment methods are predicated on accurate and precise positioning for treatment, intrafraction motion monitoring is medically necessary to ensure accurate and safe treatment delivery.  The ability to quantify intrafraction motion without excessive ionizing radiation dose can only be performed with optical surface tracking. Accordingly, surface imaging offers the opportunity to obtain 3D measurements of patient position throughout IMRT and 3D treatments without excessive radiation exposure.  I am ordering optical surface tracking for this patient's upcoming course of radiotherapy. ________________________________ Signature   Reference:   Ursula Alert, J, et al. Surface imaging-based analysis of intrafraction motion for breast radiotherapy patients.Journal of [employer], n. 6, nov. 2014. ISSN 21224825.   Available at: <http://www.jacmp.org/index.php/jacmp/article/view/4957>.

## 2014-03-27 NOTE — Progress Notes (Signed)
Name: Jennifer Frazier   MRN: [id number]  Date:  03/27/2014  DOB: 1975-03-02  Status:outpatient    DIAGNOSIS: Breast cancer.  CONSENT VERIFIED: yes   SET UP: Patient is setup supine   IMMOBILIZATION:  The following immobilization was used:Custom Moldable Pillow, breast board.   NARRATIVE: [name] was brought to the [place].  Identity was confirmed.  All relevant records and images related to the planned course of therapy were reviewed.  Then, the patient was positioned in a stable reproducible clinical set-up for radiation therapy.  Wires were placed to delineate the clinical extent of breast tissue. A wire was placed on the scar as well.  CT images were obtained.  An isocenter was placed. Skin markings were placed.  The CT images were loaded into the planning software where the target and avoidance structures were contoured.  The radiation prescription was entered and confirmed. The patient was discharged in stable condition and tolerated simulation well.    TREATMENT PLANNING NOTE:  Treatment planning then occurred. I have requested : MLC's, isodose plan, basic dose calculation  I personally designed and supervised the construction of 4 medically necessary complex treatment devices for the protection of critical normal structures including the lungs and contralateral breast as well as the immobilization device which is necessary for set up certainty.   3D simulation occurred. I requested and analyzed a dose volume histogram of the heart, lungs and lumpectomy cavity.

## 2014-03-28 ENCOUNTER — Telehealth: Payer: Self-pay | Admitting: *Deleted

## 2014-03-28 NOTE — Telephone Encounter (Signed)
Patient called reporting vomiting x 1 early this morning and a temperature of 100.8 degrees.  She had her last chemo with Taxol on 03/08/14.  She denied any cough or sore throat, and urinary tract symptoms.  She has a Port a Cath, but denies any redness or discomfort at the [place] site.  She did have some nausea and took Phenergan with good relief.  Discussed patient with [facility] NP.  She advised pushing clear liquids, Tylenol or ibuprofen and we will follow up by phone with her in the morning.

## 2014-03-29 NOTE — Telephone Encounter (Signed)
03/29/14 8:50:  Called patient to see how she is doing.  She said she has no fever this morning (98.4), no more vomiting and has an appetite.  Overall, she is feeling much better.

## 2014-04-02 DIAGNOSIS — Z510 Encounter for antineoplastic radiation therapy: Secondary | ICD-10-CM | POA: Diagnosis not present

## 2014-04-03 ENCOUNTER — Ambulatory Visit
Admission: RE | Admit: 2014-04-03 | Discharge: 2014-04-03 | Disposition: A | Discharge status: Home / Self Care | Payer: 59 | Source: Ambulatory Visit | Attending: Radiation Oncology | Admitting: Radiation Oncology

## 2014-04-03 ENCOUNTER — Ambulatory Visit: Admission: RE | Admit: 2014-04-03 | Payer: 59 | Source: Ambulatory Visit | Admitting: Radiation Oncology

## 2014-04-03 ENCOUNTER — Ambulatory Visit: Payer: 59

## 2014-04-03 DIAGNOSIS — Z510 Encounter for antineoplastic radiation therapy: Secondary | ICD-10-CM | POA: Diagnosis not present

## 2014-04-04 ENCOUNTER — Ambulatory Visit (HOSPITAL_BASED_OUTPATIENT_CLINIC_OR_DEPARTMENT_OTHER): Payer: 59 | Admitting: Oncology

## 2014-04-04 ENCOUNTER — Ambulatory Visit
Admission: RE | Admit: 2014-04-04 | Discharge: 2014-04-04 | Disposition: A | Discharge status: Home / Self Care | Payer: 59 | Source: Ambulatory Visit | Attending: Radiation Oncology | Admitting: Radiation Oncology

## 2014-04-04 ENCOUNTER — Telehealth: Payer: Self-pay | Admitting: Oncology

## 2014-04-04 ENCOUNTER — Other Ambulatory Visit (HOSPITAL_BASED_OUTPATIENT_CLINIC_OR_DEPARTMENT_OTHER): Payer: 59

## 2014-04-04 VITALS — BP 118/82 | HR 76 | Temp 98.2°F | Resp 18 | Ht 69.0 in | Wt 241.1 lb

## 2014-04-04 DIAGNOSIS — D0510 Intraductal carcinoma in situ of unspecified breast: Secondary | ICD-10-CM

## 2014-04-04 DIAGNOSIS — Z510 Encounter for antineoplastic radiation therapy: Secondary | ICD-10-CM | POA: Diagnosis not present

## 2014-04-04 DIAGNOSIS — D701 Agranulocytosis secondary to cancer chemotherapy: Secondary | ICD-10-CM

## 2014-04-04 DIAGNOSIS — Z803 Family history of malignant neoplasm of breast: Secondary | ICD-10-CM

## 2014-04-04 DIAGNOSIS — T451X5A Adverse effect of antineoplastic and immunosuppressive drugs, initial encounter: Secondary | ICD-10-CM

## 2014-04-04 DIAGNOSIS — C50411 Malignant neoplasm of upper-outer quadrant of right female breast: Secondary | ICD-10-CM

## 2014-04-04 DIAGNOSIS — D0512 Intraductal carcinoma in situ of left breast: Secondary | ICD-10-CM

## 2014-04-04 DIAGNOSIS — Z800 Family history of malignant neoplasm of digestive organs: Secondary | ICD-10-CM

## 2014-04-04 DIAGNOSIS — Z170 Estrogen receptor positive status [ER+]: Secondary | ICD-10-CM

## 2014-04-04 LAB — CBC WITH DIFFERENTIAL/PLATELET
BASO%: 0.2 % (ref 0.0–2.0)
Basophils Absolute: 0 10*3/uL (ref 0.0–0.1)
EOS%: 2.5 % (ref 0.0–7.0)
Eosinophils Absolute: 0.1 10*3/uL (ref 0.0–0.5)
HCT: 40.2 % (ref 34.8–46.6)
HGB: 13.5 g/dL (ref 11.6–15.9)
LYMPH#: 1 10*3/uL (ref 0.9–3.3)
LYMPH%: 20.3 % (ref 14.0–49.7)
MCH: 27.2 pg (ref 25.1–34.0)
MCHC: 33.6 g/dL (ref 31.5–36.0)
MCV: 80.9 fL (ref 79.5–101.0)
MONO#: 0.2 10*3/uL (ref 0.1–0.9)
MONO%: 3.9 % (ref 0.0–14.0)
NEUT#: 3.6 10*3/uL (ref 1.5–6.5)
NEUT%: 73.1 % (ref 38.4–76.8)
Platelets: 191 10*3/uL (ref 145–400)
RBC: 4.97 10*6/uL (ref 3.70–5.45)
RDW: 15.8 % — ABNORMAL HIGH (ref 11.2–14.5)
WBC: 4.9 10*3/uL (ref 3.9–10.3)

## 2014-04-04 LAB — COMPREHENSIVE METABOLIC PANEL (CC13)
ALT: 15 U/L (ref 0–55)
ANION GAP: 10 meq/L (ref 3–11)
AST: 20 U/L (ref 5–34)
Albumin: 3.9 g/dL (ref 3.5–5.0)
Alkaline Phosphatase: 88 U/L (ref 40–150)
BUN: 16.1 mg/dL (ref 7.0–26.0)
CHLORIDE: 104 meq/L (ref 98–109)
CO2: 28 meq/L (ref 22–29)
Calcium: 10.4 mg/dL (ref 8.4–10.4)
Creatinine: 0.8 mg/dL (ref 0.6–1.1)
EGFR: >90 mL/min/{1.73_m2} (ref >90)
Glucose: 90 mg/dl (ref 70–140)
Potassium: 3.9 mEq/L (ref 3.5–5.1)
Sodium: 142 mEq/L (ref 136–145)
Total Bilirubin: 0.5 mg/dL (ref 0.20–1.20)
Total Protein: 7.4 g/dL (ref 6.4–8.3)

## 2014-04-04 NOTE — Telephone Encounter (Signed)
appts made and avs printed for pt  Jennifer Frazier °

## 2014-04-04 NOTE — Progress Notes (Signed)
[facility]  Telephone:(336) [phone] Fax:(336) [phone]     ID: Jennifer Frazier DOB: 1975-08-06  MR#: [id number]  VOJ#:[id number]  Patient Care Team: Sharilyn Sites, MD as PCP - General (Family Medicine) Chauncey Cruel, MD as Consulting Physician (Oncology) Thea Silversmith, MD as Consulting Physician (Radiation Oncology) [name], MD as Consulting Physician (General Surgery)   CHIEF COMPLAINT: Estrogen receptor positive stage I breast cancer CURRENT TREATMENT: receiving adjuvant chemotherapy   BREAST CANCER HISTORY: From the original intake note:  The patient had screening mammography at [facility] showing calcifications in the right breast, and was referred to the breast Center 07/06/2013 for additional views. Right diagnostic mammography confirmed a group of amorphous calcifications in the upper outer right breast measuring 1.2 cm.  Biopsy of this area 07/17/2013 showed ([id number]) ductal carcinoma in situ, high-grade, estrogen receptor 100% positive, and progesterone receptor 96% positive, both with strong staining intensity.  The patient was then referred to surgery and on 07/25/2013 underwent bilateral breast MRI. This showed a breast density category CEA. In the right breast there was an area of clumped nodular enhancement measuring 3 cm. There was also a 1.5 cm circumscribed oval mass in the medial portion of the right breast consistent with a fibroadenoma. The left breast was unremarkable, and there were no abnormal appearing lymph nodes.  On 08/08/2013 the patient underwent right lumpectomy and sentinel lymph node sampling. The pathology ([id number] was (showed, in addition to ductal carcinoma in situ, invasive ductal carcinoma, measuring 0.7 cm, grade 3, estrogen receptor 86% positive, progesterone receptor 89% positive, both with strong staining intensity, with an MIB-1 of 62%, and no HER-2 amplification. Both sentinel lymph nodes were clear.  Margins were ample.   The patient's subsequent history is as detailed below  INTERVAL HISTORY: Jennifer Frazier returns today for follow up of her breast cancer, accompanied by her mother. Today is day 1 cycle 12 of 12 planned cycles of paclitaxel. She is doing well this week, and is excited to complete chemotherapy today. The "softness" to her fingertips has actually improved and the markings to her nailbeds are not as bad as previously pictured.   REVIEW OF SYSTEMS: Jennifer Frazier denies fevers, chills, nausea, vomiting, or changes in bowel or bladder habits. Her appetite is fair and she is drinking well. She denies mouth sores or rashes. She has no headaches, dizziness, or vision changes. She denies shortness of breath, chest pain, cough, or palpitations. She has just mild headaches, and continues to wean off the venlafaxine. She takes 300-663m QHS for her hot flashes. A detailed review of systems is otherwise negative.   PAST MEDICAL HISTORY: Past Medical History  Diagnosis Date  . Fibroids     uterine fibroids  . Anemia   . Thyroid disease   . Contact lens/glasses fitting     wears contacts or glasses  . Sleep apnea     has a cpap-does not always use it  . Cancer     Breast    PAST SURGICAL HISTORY: Past Surgical History  Procedure Laterality Date  . [place] 2012    urerine ablasion  . Eye surgery  2013    retinal tear-lazer-both  . Wisdom tooth extraction    . Breast surgery      lumpectomy 7/14  . Portacath placement Right 09/29/2013    Procedure: INSERTION PORT-A-CATH WITH ULTRA SOUND;  Surgeon: TErroll Luna MD;  [facility]  Service: General;  Laterality: Right;  .  Port a cath revision N/A 12/19/2013    Procedure: REPOSITION PORT;  Surgeon: [name], MD;  Location: [facility];  Service: General;  Laterality: N/A;    FAMILY HISTORY Family History  Problem Relation Age of Onset  . Cancer Father 66    colon  . Cancer Maternal Aunt 50     breast cancer  . Cancer Paternal Aunt 76    breast cancer  . Cancer Maternal Grandmother 54    ovarian or uterine cancer  . Cancer Paternal Grandmother     unknown primary   the patient's father died at the age of 28, been diagnosed with colon cancer at the age of 76. The patient's mother is living, currently 105 years old. The patient has one brother, no sisters. One of the patient's mother's sisters was diagnosed with breast cancer at the age of 75 in one of the patient's father's mother was diagnosed with breast cancer at the age of 51. There is no history of ovarian cancer in the family.  GYNECOLOGIC HISTORY:  No LMP recorded. Menarche age 74, for the patient is GX P0. She still having regular periods. 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    ADVANCED DIRECTIVES: Not in place; at the 08/24/2013 visit the patient was given the appropriate documents to come feel notarize associate may declare healthcare power of attorney.   HEALTH MAINTENANCE: History  Substance Use Topics  . Smoking status: Never Smoker   . Smokeless tobacco: Not on file  . Alcohol Use: No     Colonoscopy:  PAP:  Bone density:  Lipid panel:  Allergies  Allergen Reactions  . Gadolinium      Desc: NAUSEA WITH MRI CONTRAST-NO VOMITING   . Tramadol Hives and Nausea Only    Current Outpatient Prescriptions  Medication Sig Dispense Refill  . Cholecalciferol (VITAMIN D3) 3000 UNITS TABS Take 10,000 Units by mouth daily.    . diphenhydramine-acetaminophen (TYLENOL PM) 25-500 MG TABS Take 1 tablet by mouth at bedtime as needed (pain/headache).    . gabapentin (NEURONTIN) 300 MG capsule Take 1 capsule (300 mg total) by mouth 3 (three) times daily. (Patient taking differently:  Take 300 mg by mouth 3 (three) times daily. Pt takes this medication at bedtime 2 capsules) 30 capsule 3  . hydrochlorothiazide (HYDRODIURIL) 25 MG tablet Take 1 tablet (25 mg total) by mouth daily. 30 tablet 1  . HYDROcodone-acetaminophen (NORCO) 5-325 MG per tablet Take 1 tablet by mouth every 6 (six) hours as needed for moderate pain. 30 tablet 0  . ibuprofen (ADVIL,MOTRIN) 100 MG/5ML suspension Take 200 mg by mouth every 4 (four) hours as needed.    . lidocaine-prilocaine (EMLA) cream Apply 1 application topically as needed. 30 g 0  . promethazine (PHENERGAN) 25 MG tablet Take 25 mg by mouth every 6 (six) hours as needed for nausea or vomiting.    . thyroid (ARMOUR) 130 MG tablet Take 130 mg by mouth daily.    . traZODone (DESYREL) 50 MG tablet Take 1 tablet (50 mg total) by mouth at bedtime. (Patient not taking: Reported on 03/14/2014) 30 tablet 2  . venlafaxine (EFFEXOR) 37.5 MG tablet Take 1 tablet (37.5 mg total) by mouth taper from 4 doses each day to 1 dose and stop. 45 tablet 0  .  venlafaxine XR (EFFEXOR-XR) 75 MG 24 hr capsule   0  . vitamin B-12 (CYANOCOBALAMIN) 1000 MCG tablet Take 5,000 mcg by mouth daily.     No current facility-administered medications for this visit.    OBJECTIVE: Middle-aged [place] American woman who appears stated age 9 Vitals:   04/04/14 1144  BP: 118/82  Pulse: 76  Temp: 98.2 F (36.8 C)  Resp: 18     Body mass index is 35.59 kg/(m^2).    ECOG FS:1 - Symptomatic but completely ambulatory  Skin: warm, dry, white horizontal fingernail damage to nail beds. Left thumbnail separating from nailbed HEENT: sclerae anicteric, conjunctivae pink, oropharynx clear. No thrush or mucositis.  Lymph Nodes: No cervical or supraclavicular lymphadenopathy  Lungs: clear to auscultation bilaterally, no rales, wheezes, or rhonci  Heart: regular rate and rhythm  Abdomen: round, soft, non tender, positive bowel sounds  Musculoskeletal: No focal spinal tenderness, no  peripheral edema  Neuro: non focal, well oriented, positive affect  Breast: deferred   LAB RESULTS:  CMP     Component Value Date/Time   NA 142 03/08/2014 1137   NA 131* 02/02/2014 0835   K 3.8 03/08/2014 1137   K 4.2 02/02/2014 0835   CL 101 02/02/2014 0835   CO2 29 03/08/2014 1137   CO2 24 02/02/2014 0835   GLUCOSE 91 03/08/2014 1137   GLUCOSE 93 02/02/2014 0835   BUN 25.2 03/08/2014 1137   BUN 17 02/02/2014 0835   CREATININE 0.8 03/08/2014 1137   CREATININE 0.68 02/02/2014 0835   CALCIUM 10.3 03/08/2014 1137   CALCIUM 9.4 02/02/2014 0835   PROT 7.2 03/08/2014 1137   PROT 6.7 10/12/2013 1628   ALBUMIN 3.7 03/08/2014 1137   ALBUMIN 3.3* 10/12/2013 1628   AST 15 03/08/2014 1137   AST 15 10/12/2013 1628   ALT 13 03/08/2014 1137   ALT 16 10/12/2013 1628   ALKPHOS 93 03/08/2014 1137   ALKPHOS 93 10/12/2013 1628   BILITOT 0.41 03/08/2014 1137   BILITOT 0.4 10/12/2013 1628   GFRNONAA >90 02/02/2014 0835   GFRAA >90 02/02/2014 0835    I No results found for: SPEP  Lab Results  Component Value Date   WBC 4.9 04/04/2014   NEUTROABS 3.6 04/04/2014   HGB 13.5 04/04/2014   HCT 40.2 04/04/2014   MCV 80.9 04/04/2014   PLT 191 04/04/2014      Chemistry      Component Value Date/Time   NA 142 03/08/2014 1137   NA 131* 02/02/2014 0835   K 3.8 03/08/2014 1137   K 4.2 02/02/2014 0835   CL 101 02/02/2014 0835   CO2 29 03/08/2014 1137   CO2 24 02/02/2014 0835   BUN 25.2 03/08/2014 1137   BUN 17 02/02/2014 0835   CREATININE 0.8 03/08/2014 1137   CREATININE 0.68 02/02/2014 0835      Component Value Date/Time   CALCIUM 10.3 03/08/2014 1137   CALCIUM 9.4 02/02/2014 0835   ALKPHOS 93 03/08/2014 1137   ALKPHOS 93 10/12/2013 1628   AST 15 03/08/2014 1137   AST 15 10/12/2013 1628   ALT 13 03/08/2014 1137   ALT 16 10/12/2013 1628   BILITOT 0.41 03/08/2014 1137   BILITOT 0.4 10/12/2013 1628       No results found for: LABCA2  No components found for:  LABCA125  No results for input(s): INR in the last 168 hours.  Urinalysis    Component Value Date/Time   COLORURINE YELLOW 02/02/2014 0845   APPEARANCEUR CLEAR 02/02/2014  0845   LABSPEC 1.009 02/02/2014 0845   PHURINE 6.0 02/02/2014 0845   GLUCOSEU NEGATIVE 02/02/2014 0845   HGBUR NEGATIVE 02/02/2014 0845   BILIRUBINUR NEGATIVE 02/02/2014 0845   KETONESUR NEGATIVE 02/02/2014 0845   PROTEINUR NEGATIVE 02/02/2014 0845   UROBILINOGEN 0.2 02/02/2014 0845   NITRITE NEGATIVE 02/02/2014 0845   LEUKOCYTESUR TRACE* 02/02/2014 0845    STUDIES: No results found.  ASSESSMENT: 39 y.o. BRCA negative [place] woman status post right breast biopsy 07/17/2013 for high-grade ductal carcinoma in situ, estrogen and progesterone receptor positive  (1) status post right lumpectomy and sentinel lymph node sampling 08/08/2013 for a pT1b pN0, stage IA invasive ductal carcinoma, grade 3, estrogen and progesterone receptor positive, with an MIB-1 of 62% and no HER-2 amplification  (2) genetics sent 08/02/2013 was normal but did identify a variant of uncertain significance called BRCA2, [id number].  (3) Oncotype score of 23 predicts a risk of outside the breast recurrence within 10 years of 15% if the patient's only systemic treatment is tamoxifen for 5 years. Adjuvant chemotherapy was recommended  (4) doxorubicin and cyclophosphamide in dose dense fashion x4  Completed 11/16/2013, followed by paclitaxel weekly x12  Completed 03/08/2014  (5) adjuvant radiation to follow chemotherapy  (6) anti-estrogens for 5-10 years to follow radiation  (7) "flipped port:" The port head in Jennifer Frazier's anterior chest appeared unusually mobile. Port replaced on 12/19/13  PLAN: Jennifer Frazier is  Entering menopause at the [facility], like so many young women who receive chemotherapy. I think with a  Little more time amount the hot flashes will be or at least seem less of a problem. He weight gain is a major issue.  Did tendency to gain  weight is not going to go away and she has a ready picked up by about 20 pounds. We talked about snacks that she could use when she is feeling ravenous and of course we emphasized avoiding carbohydrates. She is exercising some but would need to pretty much double what she is doing now if she  Were to have a chance of at least holding her current weight.   I think she would benefit from very low dose venlafaxine, perhaps one half of a generic 37.5 mg tablet at bedtime. She is very reluctant to try this because of blood pressure and sleep issues. We certainly could do the clonidine patch but at this point what I would prefer to do is simply watch and wait. She will see me again in mid May. We will review menopausal symptoms again at that point  As we plan to start tamoxifen.  She knows to call for any problems that may develop before her next visit here.   Chauncey Cruel, MD   04/04/2014 12:03 PM

## 2014-04-05 ENCOUNTER — Ambulatory Visit
Admission: RE | Admit: 2014-04-05 | Discharge: 2014-04-05 | Disposition: A | Discharge status: Home / Self Care | Payer: 59 | Source: Ambulatory Visit | Attending: Radiation Oncology | Admitting: Radiation Oncology

## 2014-04-05 DIAGNOSIS — Z510 Encounter for antineoplastic radiation therapy: Secondary | ICD-10-CM | POA: Diagnosis not present

## 2014-04-05 DIAGNOSIS — C50411 Malignant neoplasm of upper-outer quadrant of right female breast: Secondary | ICD-10-CM | POA: Diagnosis not present

## 2014-04-05 MED ORDER — RADIAPLEXRX EX GEL
Freq: Once | CUTANEOUS | Status: AC
  Administered 2014-04-05: 10:00:00 via TOPICAL

## 2014-04-05 MED ORDER — ALRA NON-METALLIC DEODORANT (RAD-ONC)
1.0000 "application " | Freq: Once | TOPICAL | Status: AC
  Administered 2014-04-05: 1 via TOPICAL

## 2014-04-05 NOTE — Progress Notes (Signed)
Patient for education and explanation of clinic routine.Given Radiation Therapy and You Booklet, alra deodorant, radiaplex and skin care sheet.Informed doctor day weekly on Tuesday.Explained possible side effects to include skin discoloration, fatigue, tenderness and swelling.Stop wearing of under wire bra during treatment.apply radiaplex after treatment and at bedtime but not 3 to 4 hours before treatment.Push fluids for hydration.Continue exercise routine with in normal limits, rest when able.Informed skin changes could be from mild discoloration to increased darkness with dry or wt peel with itching rash.We will what products to try if needed during weekly assessment or sooner if needed.

## 2014-04-06 ENCOUNTER — Ambulatory Visit
Admission: RE | Admit: 2014-04-06 | Discharge: 2014-04-06 | Disposition: A | Discharge status: Home / Self Care | Payer: 59 | Source: Ambulatory Visit | Attending: Radiation Oncology | Admitting: Radiation Oncology

## 2014-04-06 ENCOUNTER — Ambulatory Visit: Payer: 59 | Admitting: Radiation Oncology

## 2014-04-06 DIAGNOSIS — Z510 Encounter for antineoplastic radiation therapy: Secondary | ICD-10-CM | POA: Diagnosis not present

## 2014-04-09 ENCOUNTER — Ambulatory Visit
Admission: RE | Admit: 2014-04-09 | Discharge: 2014-04-09 | Disposition: A | Discharge status: Home / Self Care | Payer: 59 | Source: Ambulatory Visit | Attending: Radiation Oncology | Admitting: Radiation Oncology

## 2014-04-09 DIAGNOSIS — Z510 Encounter for antineoplastic radiation therapy: Secondary | ICD-10-CM | POA: Diagnosis not present

## 2014-04-10 ENCOUNTER — Ambulatory Visit
Admission: RE | Admit: 2014-04-10 | Discharge: 2014-04-10 | Disposition: A | Discharge status: Home / Self Care | Payer: 59 | Source: Ambulatory Visit | Attending: Radiation Oncology | Admitting: Radiation Oncology

## 2014-04-10 VITALS — BP 130/93 | HR 72 | Temp 98.4°F | Wt 244.7 lb

## 2014-04-10 DIAGNOSIS — Z510 Encounter for antineoplastic radiation therapy: Secondary | ICD-10-CM | POA: Diagnosis not present

## 2014-04-10 DIAGNOSIS — C50411 Malignant neoplasm of upper-outer quadrant of right female breast: Secondary | ICD-10-CM

## 2014-04-10 NOTE — Progress Notes (Signed)
Weekly Management Note Current Dose:  9 Gy  Projected Dose: 61 Gy   Narrative:  The patient presents for routine under treatment assessment.  CBCT/MVCT images/Port film x-rays were reviewed.  The chart was checked. Doing well. No complaints except weight gain.   Physical Findings: Weight: 244 lb 11.2 oz (110.995 kg). Unchanged  Impression:  The patient is tolerating radiation.  Plan:  Continue treatment as planned. We discussed the hormonal changes associated with chemotherapy. She will continue radiaplex for skin care.

## 2014-04-10 NOTE — Progress Notes (Signed)
Weekly assessment of radiation day 5 to right breast.Denies pain.No skin changes.hydrochlorathiazide has been discontinued.

## 2014-04-11 ENCOUNTER — Ambulatory Visit
Admission: RE | Admit: 2014-04-11 | Discharge: 2014-04-11 | Disposition: A | Discharge status: Home / Self Care | Payer: 59 | Source: Ambulatory Visit | Attending: Radiation Oncology | Admitting: Radiation Oncology

## 2014-04-11 ENCOUNTER — Telehealth: Payer: Self-pay | Admitting: *Deleted

## 2014-04-11 DIAGNOSIS — Z510 Encounter for antineoplastic radiation therapy: Secondary | ICD-10-CM | POA: Diagnosis not present

## 2014-04-11 NOTE — Progress Notes (Signed)
Patient to nursing to state she would like to try a different anti-depressant.On inquiring what symptoms she is experiencing she states she has been waking up during the night in panic.She isn't having any crying or feelings of hurting herself.She just tapered off anti-depressant.She doesn't attribute depression to diagnosis."I think maybe I have always had symptoms but just never had treatment."I called [facility] for Dr.Magrinat, she will call me back with decision of what to do, meanwhile I informed patient. that I will notify her later.

## 2014-04-11 NOTE — Telephone Encounter (Signed)
This RN spoke with pt per discussion with RN in [place] and probable need for other form of SSR.  Per discussion on phone Jennifer Frazier states the venlafaxine was beneficial in her sense of well being and state of mind " but then it drove my BP so high I was in the ER "  Jennifer Frazier states she has " always kinda woke up in the night in kind of a panic and then takes a while to get back to sleep - but increased since diagnosis "  Per call this RN discussed use of SSR and benefit with Jennifer Frazier willing to try another prescription. She is also interested in resources " that can tell me if my serotonin is low ".  This RN will refer above to support services. Medication inquiry will be given to MD.

## 2014-04-12 ENCOUNTER — Ambulatory Visit
Admission: RE | Admit: 2014-04-12 | Discharge: 2014-04-12 | Disposition: A | Discharge status: Home / Self Care | Payer: 59 | Source: Ambulatory Visit | Attending: Radiation Oncology | Admitting: Radiation Oncology

## 2014-04-12 DIAGNOSIS — Z510 Encounter for antineoplastic radiation therapy: Secondary | ICD-10-CM | POA: Diagnosis not present

## 2014-04-13 ENCOUNTER — Telehealth: Payer: Self-pay | Admitting: *Deleted

## 2014-04-13 ENCOUNTER — Ambulatory Visit
Admission: RE | Admit: 2014-04-13 | Discharge: 2014-04-13 | Disposition: A | Discharge status: Home / Self Care | Payer: 59 | Source: Ambulatory Visit | Attending: Radiation Oncology | Admitting: Radiation Oncology

## 2014-04-13 ENCOUNTER — Other Ambulatory Visit: Payer: Self-pay | Admitting: *Deleted

## 2014-04-13 DIAGNOSIS — Z510 Encounter for antineoplastic radiation therapy: Secondary | ICD-10-CM | POA: Diagnosis not present

## 2014-04-13 MED ORDER — BUPROPION HCL ER (XL) 150 MG PO TB24: 150.0000 mg | ORAL_TABLET | Freq: Every day | ORAL | Status: DC

## 2014-04-13 NOTE — Telephone Encounter (Signed)
This RN per MD review of pt's concern recommended pt to start wellbutrin.  Prescription obtained and escribed to pt's pharmacy.  This RN attempted to call pt and obtained an identified VM per cell number.  Detailed message left informing pt of above and request per MD to call in one week with update and possible need to increase dose.

## 2014-04-13 NOTE — Telephone Encounter (Signed)
Jennifer Frazier left voice mail wanting discuss side effect of new medication, which appears to be Wellbutrin.  Attempted to return call, no answer, left message for her to review the patient information provided by the pharmacy where the prescription is filled and to call if she has specific questions or concerns.

## 2014-04-16 ENCOUNTER — Ambulatory Visit
Admission: RE | Admit: 2014-04-16 | Discharge: 2014-04-16 | Disposition: A | Discharge status: Home / Self Care | Payer: 59 | Source: Ambulatory Visit | Attending: Radiation Oncology | Admitting: Radiation Oncology

## 2014-04-16 DIAGNOSIS — Z510 Encounter for antineoplastic radiation therapy: Secondary | ICD-10-CM | POA: Diagnosis not present

## 2014-04-17 ENCOUNTER — Ambulatory Visit
Admission: RE | Admit: 2014-04-17 | Discharge: 2014-04-17 | Disposition: A | Discharge status: Home / Self Care | Payer: 59 | Source: Ambulatory Visit | Attending: Radiation Oncology | Admitting: Radiation Oncology

## 2014-04-17 VITALS — BP 122/79 | HR 72 | Temp 98.4°F | Wt 242.4 lb

## 2014-04-17 DIAGNOSIS — Z510 Encounter for antineoplastic radiation therapy: Secondary | ICD-10-CM | POA: Diagnosis not present

## 2014-04-17 DIAGNOSIS — C50411 Malignant neoplasm of upper-outer quadrant of right female breast: Secondary | ICD-10-CM

## 2014-04-17 NOTE — Progress Notes (Signed)
Weekly Management Note Current Dose:  18 Gy  Projected Dose: 61 Gy   Narrative:  The patient presents for routine under treatment assessment.  CBCT/MVCT images/Port film x-rays were reviewed.  The chart was checked. Doing well. No complaints. Hesitant to try Wellbutrin due to side effects she feels may be similar to Effexor which she did not tolerate.   Physical Findings: Weight: 242 lb 6.4 oz (109.952 kg). Unchanged. Slightly dark right breast.  Impression:  The patient is tolerating radiation.  Plan:  Continue treatment as planned. Discussed possible side effects and likely side effects. Discussed this was a medicaiton to make her feel better not worse and was not a requirement for her cancer treatment. Starting was up to her but she should not immediately start and discontinue. Has appt with [facility] in May.

## 2014-04-17 NOTE — Progress Notes (Signed)
Weekly assessment of radiation to right breast.Completed 10 of 33 treatments.Denies pain.Has mild tanning of skin.Spoke to patient regarding starting wellbutrin.She will start Saturday as to assess for possible side effects like drowsiness.Patient states she hasn't taken gabapentin in a few days.I told her she needs to taper off this and not just stop so she will go from 2 pills nightly x 1 week then 1 pill nightly for one week.Continue application of radiaplex.

## 2014-04-18 ENCOUNTER — Ambulatory Visit
Admission: RE | Admit: 2014-04-18 | Discharge: 2014-04-18 | Disposition: A | Discharge status: Home / Self Care | Payer: 59 | Source: Ambulatory Visit | Attending: Radiation Oncology | Admitting: Radiation Oncology

## 2014-04-18 DIAGNOSIS — Z510 Encounter for antineoplastic radiation therapy: Secondary | ICD-10-CM | POA: Diagnosis not present

## 2014-04-19 ENCOUNTER — Encounter: Payer: Self-pay | Admitting: *Deleted

## 2014-04-19 ENCOUNTER — Ambulatory Visit
Admission: RE | Admit: 2014-04-19 | Discharge: 2014-04-19 | Disposition: A | Discharge status: Home / Self Care | Payer: 59 | Source: Ambulatory Visit | Attending: Radiation Oncology | Admitting: Radiation Oncology

## 2014-04-19 DIAGNOSIS — Z510 Encounter for antineoplastic radiation therapy: Secondary | ICD-10-CM | POA: Diagnosis not present

## 2014-04-19 NOTE — Progress Notes (Signed)
[place] Work  Clinical Social Work was referred by Medical sales representative for assessment of psychosocial needs due to possible depression.  Clinical Social Worker met with patient at [facility] after her radiation treatment to offer support and assess for needs. CSW introduced self and reviewed role of CSW, Pt and Family Support Team and Support Resources to assist pt. Pt is very interested in Breast Cancer Support Group for additional support. Pt was educated that [place] team could meet with her before or after radiation appointments for additional support as well. Pt will consider and agrees to begin medication as medical team has prescribed to assist her. She plans to start this weekend. She agrees to reach out as needed and is aware of how to contact CSW.   Clinical Social Work interventions: Resource education Supportive listening  Loren Racer, [place] Worker [place]  [place] Phone: [phone] Fax: [phone]

## 2014-04-20 ENCOUNTER — Ambulatory Visit
Admission: RE | Admit: 2014-04-20 | Discharge: 2014-04-20 | Disposition: A | Discharge status: Home / Self Care | Payer: 59 | Source: Ambulatory Visit | Attending: Radiation Oncology | Admitting: Radiation Oncology

## 2014-04-20 ENCOUNTER — Other Ambulatory Visit: Payer: Self-pay | Admitting: Nurse Practitioner

## 2014-04-20 DIAGNOSIS — Z510 Encounter for antineoplastic radiation therapy: Secondary | ICD-10-CM | POA: Diagnosis not present

## 2014-04-23 ENCOUNTER — Ambulatory Visit
Admission: RE | Admit: 2014-04-23 | Discharge: 2014-04-23 | Disposition: A | Discharge status: Home / Self Care | Payer: 59 | Source: Ambulatory Visit | Attending: Radiation Oncology | Admitting: Radiation Oncology

## 2014-04-23 DIAGNOSIS — Z510 Encounter for antineoplastic radiation therapy: Secondary | ICD-10-CM | POA: Diagnosis not present

## 2014-04-24 ENCOUNTER — Ambulatory Visit
Admission: RE | Admit: 2014-04-24 | Discharge: 2014-04-24 | Disposition: A | Discharge status: Home / Self Care | Payer: 59 | Source: Ambulatory Visit | Attending: Radiation Oncology | Admitting: Radiation Oncology

## 2014-04-24 ENCOUNTER — Other Ambulatory Visit: Payer: Self-pay | Admitting: Nurse Practitioner

## 2014-04-24 VITALS — BP 130/91 | Temp 97.9°F | Wt 243.7 lb

## 2014-04-24 DIAGNOSIS — C50411 Malignant neoplasm of upper-outer quadrant of right female breast: Secondary | ICD-10-CM

## 2014-04-24 DIAGNOSIS — Z510 Encounter for antineoplastic radiation therapy: Secondary | ICD-10-CM | POA: Diagnosis not present

## 2014-04-24 NOTE — Progress Notes (Signed)
Weekly Management Note Current Dose: 27  Gy  Projected Dose: 61 Gy   Narrative:  The patient presents for routine under treatment assessment.  CBCT/MVCT images/Port film x-rays were reviewed.  The chart was checked. Doing well. No complaints except continued fatigue, insomnia, weight gain.   Physical Findings: Weight: 243 lb 11.2 oz (110.542 kg). Unchanged. Slightly dark breast/inframammary fold.   Impression:  The patient is tolerating radiation.  Plan:  Continue treatment as planned. Discussed meeting with survivorship navigator.

## 2014-04-24 NOTE — Progress Notes (Signed)
Weekly assessment of radiation to right breast.Increase in hyperpigmentation greatest of axilla and mammary fold.Questions regarding weight gain answered.Explained how chemotherapy may have attributed to weight gain and that it could take up to one year before noticeable changes.Informed to eat well balanced meals, try 6 mini meals vs 3 large meals, protein helps burn fat.Offered nutritionist at end of treatment, states she has already met with one.affect brighter today, but still hasn't started wellbutrin.

## 2014-04-25 ENCOUNTER — Ambulatory Visit
Admission: RE | Admit: 2014-04-25 | Discharge: 2014-04-25 | Disposition: A | Discharge status: Home / Self Care | Payer: 59 | Source: Ambulatory Visit | Attending: Radiation Oncology | Admitting: Radiation Oncology

## 2014-04-25 ENCOUNTER — Other Ambulatory Visit: Payer: Self-pay | Admitting: *Deleted

## 2014-04-25 DIAGNOSIS — Z510 Encounter for antineoplastic radiation therapy: Secondary | ICD-10-CM | POA: Diagnosis not present

## 2014-04-25 MED ORDER — GABAPENTIN 300 MG PO CAPS: 300.0000 mg | ORAL_CAPSULE | Freq: Every day | ORAL | Status: DC

## 2014-04-26 ENCOUNTER — Ambulatory Visit
Admission: RE | Admit: 2014-04-26 | Discharge: 2014-04-26 | Disposition: A | Discharge status: Home / Self Care | Payer: 59 | Source: Ambulatory Visit | Attending: Radiation Oncology | Admitting: Radiation Oncology

## 2014-04-26 DIAGNOSIS — Z510 Encounter for antineoplastic radiation therapy: Secondary | ICD-10-CM | POA: Diagnosis not present

## 2014-04-27 ENCOUNTER — Ambulatory Visit: Payer: 59

## 2014-04-27 ENCOUNTER — Ambulatory Visit
Admission: RE | Admit: 2014-04-27 | Discharge: 2014-04-27 | Disposition: A | Discharge status: Home / Self Care | Payer: 59 | Source: Ambulatory Visit | Attending: Radiation Oncology | Admitting: Radiation Oncology

## 2014-04-30 ENCOUNTER — Ambulatory Visit
Admission: RE | Admit: 2014-04-30 | Discharge: 2014-04-30 | Disposition: A | Discharge status: Home / Self Care | Payer: 59 | Source: Ambulatory Visit | Attending: Radiation Oncology | Admitting: Radiation Oncology

## 2014-04-30 DIAGNOSIS — Z510 Encounter for antineoplastic radiation therapy: Secondary | ICD-10-CM | POA: Diagnosis not present

## 2014-05-01 ENCOUNTER — Ambulatory Visit
Admission: RE | Admit: 2014-05-01 | Discharge: 2014-05-01 | Disposition: A | Discharge status: Home / Self Care | Payer: 59 | Source: Ambulatory Visit | Attending: Radiation Oncology | Admitting: Radiation Oncology

## 2014-05-01 VITALS — BP 121/86 | HR 104 | Temp 97.7°F | Wt 242.2 lb

## 2014-05-01 DIAGNOSIS — Z510 Encounter for antineoplastic radiation therapy: Secondary | ICD-10-CM | POA: Diagnosis not present

## 2014-05-01 DIAGNOSIS — C50411 Malignant neoplasm of upper-outer quadrant of right female breast: Secondary | ICD-10-CM

## 2014-05-01 NOTE — Progress Notes (Addendum)
Weekly Management Note Current Dose:  34.2 Gy  Projected Dose:  61 Gy   Narrative:  The patient presents for routine under treatment assessment.  CBCT/MVCT images/Port film x-rays were reviewed.  The chart was checked.  She still is tired, gaining weight and has some irritation of her skin. She saw her provider at [facility] who drew labs and recommended decreasing her thyroid supplement as well as asked her to ask me to explain why her WBC was low at 3.1. Wendi brings with her a recent lab sheet from [facility] with many labs including testosterone, TSH (which is very low), estradiol etc.   Physical Findings: Weight: 242 lb 3.2 oz (109.861 kg). Unchanged. Pink right breast. No moist desquamation.   Impression:  The patient is tolerating radiation.  Plan:  Continue treatment as planned. We discussed her lab values. Her ANC is normal and therefore we can conclude her immune system is normal. I don't know the thyroid supplement she is taking but it is concerning that her TSH is low while her T3 is also low and she is gaining weight. This may be a secondary hypothyroidism. I asked her to follow up with Dr. Hilma Favors and possibly an endocrinologist. I also encouraged her to re-examine the necessity of these labs with the practioner who ordered them if she could not interpret them or was not in contact with [name] regarding them.

## 2014-05-02 ENCOUNTER — Ambulatory Visit
Admission: RE | Admit: 2014-05-02 | Discharge: 2014-05-02 | Disposition: A | Discharge status: Home / Self Care | Payer: 59 | Source: Ambulatory Visit | Attending: Radiation Oncology | Admitting: Radiation Oncology

## 2014-05-02 DIAGNOSIS — Z510 Encounter for antineoplastic radiation therapy: Secondary | ICD-10-CM | POA: Diagnosis not present

## 2014-05-03 ENCOUNTER — Ambulatory Visit
Admission: RE | Admit: 2014-05-03 | Discharge: 2014-05-03 | Disposition: A | Discharge status: Home / Self Care | Payer: 59 | Source: Ambulatory Visit | Attending: Radiation Oncology | Admitting: Radiation Oncology

## 2014-05-03 DIAGNOSIS — Z510 Encounter for antineoplastic radiation therapy: Secondary | ICD-10-CM | POA: Diagnosis not present

## 2014-05-03 DIAGNOSIS — D0512 Intraductal carcinoma in situ of left breast: Secondary | ICD-10-CM

## 2014-05-03 DIAGNOSIS — C50411 Malignant neoplasm of upper-outer quadrant of right female breast: Secondary | ICD-10-CM

## 2014-05-03 MED ORDER — BIAFINE EX EMUL
CUTANEOUS | Status: DC | PRN
  Administered 2014-05-03: 12:00:00 via TOPICAL

## 2014-05-04 ENCOUNTER — Ambulatory Visit
Admission: RE | Admit: 2014-05-04 | Discharge: 2014-05-04 | Disposition: A | Discharge status: Home / Self Care | Payer: 59 | Source: Ambulatory Visit | Attending: Radiation Oncology | Admitting: Radiation Oncology

## 2014-05-04 DIAGNOSIS — Z510 Encounter for antineoplastic radiation therapy: Secondary | ICD-10-CM | POA: Diagnosis not present

## 2014-05-07 ENCOUNTER — Ambulatory Visit
Admission: RE | Admit: 2014-05-07 | Discharge: 2014-05-07 | Disposition: A | Discharge status: Home / Self Care | Payer: 59 | Source: Ambulatory Visit | Attending: Radiation Oncology | Admitting: Radiation Oncology

## 2014-05-07 DIAGNOSIS — Z510 Encounter for antineoplastic radiation therapy: Secondary | ICD-10-CM | POA: Diagnosis not present

## 2014-05-08 ENCOUNTER — Ambulatory Visit
Admission: RE | Admit: 2014-05-08 | Discharge: 2014-05-08 | Disposition: A | Discharge status: Home / Self Care | Payer: 59 | Source: Ambulatory Visit | Attending: Radiation Oncology | Admitting: Radiation Oncology

## 2014-05-08 VITALS — BP 121/91 | HR 96 | Temp 98.4°F | Wt 243.0 lb

## 2014-05-08 DIAGNOSIS — Z510 Encounter for antineoplastic radiation therapy: Secondary | ICD-10-CM | POA: Diagnosis not present

## 2014-05-08 DIAGNOSIS — C50411 Malignant neoplasm of upper-outer quadrant of right female breast: Secondary | ICD-10-CM

## 2014-05-08 NOTE — Progress Notes (Signed)
Weekly assessment of radiation to right breast.Marked hyperpigmentation especially of axillary and mammary fold but skin does look better since start of applying biafine.Feels tightness of axilla when raising arm.Informed this is normal.Makes sure she massages skin when applying biafine.Scheduled to see PCP Friday 05/11/14 in regards to thyroid level.BP 121/91 mmHg  Pulse 96  Temp(Src) 98.4 F (36.9 C)  Wt 243 lb (110.224 kg)

## 2014-05-08 NOTE — Progress Notes (Signed)
Weekly Management Note Current Dose: 43.2  Gy  Projected Dose: 61 Gy   Narrative:  The patient presents for routine under treatment assessment.  CBCT/MVCT images/Port film x-rays were reviewed.  The chart was checked. Feeling well. Some pulling over her left axilla.   Physical Findings: Weight: 243 lb (110.224 kg). Dark skin worse under arm.   Impression:  The patient is tolerating radiation.  Plan:  Continue treatment as planned. Continue radiaplex. Follow up with PCP on Friday. Refer to ABC class for pain/stretching.

## 2014-05-09 ENCOUNTER — Ambulatory Visit
Admission: RE | Admit: 2014-05-09 | Discharge: 2014-05-09 | Disposition: A | Discharge status: Home / Self Care | Payer: 59 | Source: Ambulatory Visit | Attending: Radiation Oncology | Admitting: Radiation Oncology

## 2014-05-09 ENCOUNTER — Ambulatory Visit: Payer: 59

## 2014-05-09 DIAGNOSIS — Z510 Encounter for antineoplastic radiation therapy: Secondary | ICD-10-CM | POA: Diagnosis not present

## 2014-05-10 ENCOUNTER — Ambulatory Visit: Payer: 59

## 2014-05-10 ENCOUNTER — Ambulatory Visit
Admission: RE | Admit: 2014-05-10 | Discharge: 2014-05-10 | Disposition: A | Discharge status: Home / Self Care | Payer: 59 | Source: Ambulatory Visit | Attending: Radiation Oncology | Admitting: Radiation Oncology

## 2014-05-10 DIAGNOSIS — Z510 Encounter for antineoplastic radiation therapy: Secondary | ICD-10-CM | POA: Diagnosis not present

## 2014-05-11 ENCOUNTER — Ambulatory Visit
Admission: RE | Admit: 2014-05-11 | Discharge: 2014-05-11 | Disposition: A | Discharge status: Home / Self Care | Payer: 59 | Source: Ambulatory Visit | Attending: Radiation Oncology | Admitting: Radiation Oncology

## 2014-05-11 DIAGNOSIS — Z510 Encounter for antineoplastic radiation therapy: Secondary | ICD-10-CM | POA: Diagnosis not present

## 2014-05-14 ENCOUNTER — Ambulatory Visit
Admission: RE | Admit: 2014-05-14 | Discharge: 2014-05-14 | Disposition: A | Discharge status: Home / Self Care | Payer: 59 | Source: Ambulatory Visit | Attending: Radiation Oncology | Admitting: Radiation Oncology

## 2014-05-14 DIAGNOSIS — Z510 Encounter for antineoplastic radiation therapy: Secondary | ICD-10-CM | POA: Diagnosis not present

## 2014-05-15 ENCOUNTER — Ambulatory Visit
Admission: RE | Admit: 2014-05-15 | Discharge: 2014-05-15 | Disposition: A | Discharge status: Home / Self Care | Payer: 59 | Source: Ambulatory Visit | Attending: Radiation Oncology | Admitting: Radiation Oncology

## 2014-05-15 VITALS — BP 149/104 | HR 82 | Temp 98.6°F | Wt 244.1 lb

## 2014-05-15 DIAGNOSIS — Z510 Encounter for antineoplastic radiation therapy: Secondary | ICD-10-CM | POA: Diagnosis not present

## 2014-05-15 DIAGNOSIS — C50411 Malignant neoplasm of upper-outer quadrant of right female breast: Secondary | ICD-10-CM | POA: Diagnosis not present

## 2014-05-15 MED ORDER — BIAFINE EX EMUL
CUTANEOUS | Status: DC | PRN
  Administered 2014-05-15: 12:00:00 via TOPICAL

## 2014-05-15 NOTE — Progress Notes (Signed)
Weekly assessment of radiation to right breast/axilla.Skin hyperpigmented with dry peel.Continue application of biafine.Seen by PCP on 05/11/14 and has been started on synthroid 88 mcg.Generalized fatigue.

## 2014-05-15 NOTE — Progress Notes (Signed)
Weekly Management Note Current Dose: 55  Gy  Projected Dose: 61 Gy   Narrative:  The patient presents for routine under treatment assessment.  CBCT/MVCT images/Port film x-rays were reviewed.  The chart was checked. Saw PCP perhaps thyroid is over corrected. Started her on Synthroid. More fatigue. Breast is sore. PCP note reviewed.   Physical Findings: Weight: 244 lb 1.6 oz (110.723 kg). Unchanged. DArk dry desquamation. Inframammary fold is clear. Axilla is clear with no moist desquamation.   Impression:  The patient is tolerating radiation.  Plan:  Continue treatment as planned. Continue radiaplex.

## 2014-05-16 ENCOUNTER — Ambulatory Visit
Admission: RE | Admit: 2014-05-16 | Discharge: 2014-05-16 | Disposition: A | Discharge status: Home / Self Care | Payer: 59 | Source: Ambulatory Visit | Attending: Radiation Oncology | Admitting: Radiation Oncology

## 2014-05-16 DIAGNOSIS — Z510 Encounter for antineoplastic radiation therapy: Secondary | ICD-10-CM | POA: Diagnosis not present

## 2014-05-17 ENCOUNTER — Ambulatory Visit
Admission: RE | Admit: 2014-05-17 | Discharge: 2014-05-17 | Disposition: A | Discharge status: Home / Self Care | Payer: 59 | Source: Ambulatory Visit | Attending: Radiation Oncology | Admitting: Radiation Oncology

## 2014-05-17 DIAGNOSIS — Z510 Encounter for antineoplastic radiation therapy: Secondary | ICD-10-CM | POA: Diagnosis not present

## 2014-05-18 ENCOUNTER — Encounter: Payer: Self-pay | Admitting: Adult Health

## 2014-05-18 ENCOUNTER — Ambulatory Visit: Payer: 59

## 2014-05-18 ENCOUNTER — Ambulatory Visit
Admission: RE | Admit: 2014-05-18 | Discharge: 2014-05-18 | Disposition: A | Discharge status: Home / Self Care | Payer: 59 | Source: Ambulatory Visit | Attending: Radiation Oncology | Admitting: Radiation Oncology

## 2014-05-18 DIAGNOSIS — Z510 Encounter for antineoplastic radiation therapy: Secondary | ICD-10-CM | POA: Diagnosis not present

## 2014-05-18 NOTE — Progress Notes (Signed)
I briefly met with Jennifer Frazier after her radiation treatment today. We discussed the purpose of the Survivorship Clinic, which will include monitoring for recurrence, coordinating completion of age and gender-appropriate cancer screenings, promotion of overall wellness, as well as managing potential late/long-term side effects of anti-cancer treatments.     As of today, the intent of treatment for Jennifer Frazier is cure. Therefore, she will be eligible for the Survivorship Clinic upon her completion of treatment.  Her survivorship care plan (SCP) document will be drafted and updated throughout the course of her treatment trajectory. She will receive the SCP in an office visit with myself in the Survivorship Clinic once she has completed treatment.   She is scheduled to complete treatment: 05/21/14 She will see Dr. Pablo Ledger for her 82-monthfollow-up appt: 06/21/14 at 1pm She will then see me in Survivorship on: 06/21/14 at 1:30pm.   Ms. DButtramwas encouraged to ask questions and all questions were answered to her satisfaction.  She was given my business card and encouraged to contact me with any concerns regarding survivorship.  I look forward to participating in her care.  Of note, Jennifer Frazier expressed concern regarding her returning to work upon the completion of treatment and will need a "doctor's note" to return to work.  She tells me that her job was not secured while she was undergoing treatment and that her employer is looking for another job for her at this time.  I let Jennifer Frazier know that if they find a position for her and she is physically ready to return to work before she sees Dr. WPablo Ledgerin approx. 1 month, that I would be happy to see her and give her that release.  She has my direct office number and I encouraged her to call me if there is anything I can help with before her next visit here.    GMike Craze NP [phone]

## 2014-05-21 ENCOUNTER — Encounter: Payer: Self-pay | Admitting: Radiation Oncology

## 2014-05-21 ENCOUNTER — Ambulatory Visit
Admission: RE | Admit: 2014-05-21 | Discharge: 2014-05-21 | Disposition: A | Discharge status: Home / Self Care | Payer: 59 | Source: Ambulatory Visit | Attending: Radiation Oncology | Admitting: Radiation Oncology

## 2014-05-21 ENCOUNTER — Ambulatory Visit: Payer: 59

## 2014-05-21 DIAGNOSIS — Z510 Encounter for antineoplastic radiation therapy: Secondary | ICD-10-CM | POA: Diagnosis not present

## 2014-05-21 NOTE — Progress Notes (Signed)
Patient completed 33 fractions to right breast.Skin is dark in patches with dry peel.Continue application of biafine.Knows to call if any questions or concerns.

## 2014-05-22 ENCOUNTER — Ambulatory Visit: Payer: 59

## 2014-05-22 NOTE — Progress Notes (Signed)
  Radiation Oncology         [phone] ________________________________  Name: Jennifer Frazier MRN: [id number]  Date: 05/21/2014  DOB: 1975/07/30  End of Treatment Note  Diagnosis:   Breast cancer of upper-outer quadrant of right female breast   Staging form: Breast, AJCC 7th Edition     Pathologic: Stage IA (T1b, N0, cM0) - Signed by Thea Silversmith, MD on 08/30/2013     Indication for treatment: Curative    Radiation treatment dates:   3/9/206-05/21/2014  Site/dose:    Right breast / 45 Gray @ 1.8 Pearline Cables per fraction x 25 fractions Right breast boost / 16 Gray at [employer] per fraction x 8 fractions  Beams/energy:  Opposed Tangents / 6 MV photons 3 field with 6 MV photons  Narrative: The patient tolerated radiation treatment relatively well.   She had persistent weight gain and fatigue. She had skin darkening and dry desquamation with no moist desquamation.   Plan: The patient has completed radiation treatment. The patient will return to radiation oncology clinic for routine followup in one month. I advised them to call or return sooner if they have any questions or concerns related to their recovery or treatment.  ------------------------------------------------  Thea Silversmith, MD

## 2014-05-23 ENCOUNTER — Ambulatory Visit: Payer: 59

## 2014-05-28 ENCOUNTER — Other Ambulatory Visit: Payer: Self-pay | Admitting: *Deleted

## 2014-05-28 MED ORDER — BUPROPION HCL ER (XL) 150 MG PO TB24
150.0000 mg | ORAL_TABLET | Freq: Every day | ORAL | Status: DC
Start: 2014-05-28 — End: 2014-06-19

## 2014-06-10 NOTE — Progress Notes (Signed)
Name: Jennifer Frazier   MRN: [id number]  Date:  04/11/14   DOB: 01/10/1976  Status:outpatient    DIAGNOSIS: Breast cancer of upper-outer quadrant of right female breast   Staging form: Breast, AJCC 7th Edition     Pathologic: Stage IA (T1b, N0, cM0) - Signed by Thea Silversmith, MD on 08/30/2013   CONSENT VERIFIED: yes   SET UP: Patient is setup supine   IMMOBILIZATION:  The following immobilization was used:Custom Moldable Pillow, breast board.   NARRATIVE: Jennifer Frazier underwent complex simulation and treatment planning for her boost treatment today.  Her tumor volume was outlined on the planning CT scan.  Due to the depth of her cavity, electrons could not be used and a photon plan was developed. The plan will be prescribed to the  isodose line.   I personally supervised and approved the creation of 3 unique MLCs comprising 3 treatment devices.

## 2014-06-10 NOTE — Progress Notes (Signed)
  Radiation Oncology         [phone] ________________________________  Name: QUINITA KOSTELECKY MRN: [id number]  Date: 05/10/14 DOB: 1975-05-23  Simulation Verification Note  Status: outpatient  NARRATIVE: The patient was brought to the treatment unit and placed in the planned treatment position. The clinical setup was verified. Then port films were obtained and uploaded to the radiation oncology medical record software.  The treatment beams were carefully compared against the planned radiation fields. The position location and shape of the radiation fields was reviewed. The targeted volume of tissue appears appropriately covered by the radiation beams. Organs at risk appear to be excluded as planned.  Based on my personal review, I approved the simulation verification. The patient's treatment will proceed as planned.  ------------------------------------------------  Thea Silversmith, MD

## 2014-06-12 ENCOUNTER — Other Ambulatory Visit: Payer: Self-pay | Admitting: Obstetrics and Gynecology

## 2014-06-12 DIAGNOSIS — Z853 Personal history of malignant neoplasm of breast: Secondary | ICD-10-CM

## 2014-06-13 ENCOUNTER — Other Ambulatory Visit (HOSPITAL_BASED_OUTPATIENT_CLINIC_OR_DEPARTMENT_OTHER): Payer: 59

## 2014-06-13 DIAGNOSIS — C50411 Malignant neoplasm of upper-outer quadrant of right female breast: Secondary | ICD-10-CM

## 2014-06-13 DIAGNOSIS — Z803 Family history of malignant neoplasm of breast: Secondary | ICD-10-CM

## 2014-06-13 DIAGNOSIS — D0510 Intraductal carcinoma in situ of unspecified breast: Secondary | ICD-10-CM

## 2014-06-13 DIAGNOSIS — Z800 Family history of malignant neoplasm of digestive organs: Secondary | ICD-10-CM

## 2014-06-13 LAB — COMPREHENSIVE METABOLIC PANEL (CC13)
ALBUMIN: 4 g/dL (ref 3.5–5.0)
ALT: 12 U/L (ref 0–55)
ANION GAP: 10 meq/L (ref 3–11)
AST: 17 U/L (ref 5–34)
Alkaline Phosphatase: 113 U/L (ref 40–150)
BUN: 15.4 mg/dL (ref 7.0–26.0)
CHLORIDE: 102 meq/L (ref 98–109)
CO2: 29 mEq/L (ref 22–29)
CREATININE: 1 mg/dL (ref 0.6–1.1)
Calcium: 10.4 mg/dL (ref 8.4–10.4)
EGFR: 86 mL/min/{1.73_m2} — AB (ref >90)
Glucose: 80 mg/dl (ref 70–140)
POTASSIUM: 4.1 meq/L (ref 3.5–5.1)
SODIUM: 141 meq/L (ref 136–145)
Total Bilirubin: 0.7 mg/dL (ref 0.20–1.20)
Total Protein: 7.4 g/dL (ref 6.4–8.3)

## 2014-06-13 LAB — CBC WITH DIFFERENTIAL/PLATELET
BASO%: 0.5 % (ref 0.0–2.0)
Basophils Absolute: 0 10*3/uL (ref 0.0–0.1)
EOS ABS: 0.1 10*3/uL (ref 0.0–0.5)
EOS%: 2.7 % (ref 0.0–7.0)
HCT: 43.1 % (ref 34.8–46.6)
HGB: 14.2 g/dL (ref 11.6–15.9)
LYMPH%: 14.1 % (ref 14.0–49.7)
MCH: 27 pg (ref 25.1–34.0)
MCHC: 33 g/dL (ref 31.5–36.0)
MCV: 81.7 fL (ref 79.5–101.0)
MONO#: 0.2 10*3/uL (ref 0.1–0.9)
MONO%: 5.6 % (ref 0.0–14.0)
NEUT#: 2.8 10*3/uL (ref 1.5–6.5)
NEUT%: 77.1 % — ABNORMAL HIGH (ref 38.4–76.8)
PLATELETS: 175 10*3/uL (ref 145–400)
RBC: 5.27 10*6/uL (ref 3.70–5.45)
RDW: 13.9 % (ref 11.2–14.5)
WBC: 3.6 10*3/uL — ABNORMAL LOW (ref 3.9–10.3)
lymph#: 0.5 10*3/uL — ABNORMAL LOW (ref 0.9–3.3)

## 2014-06-18 ENCOUNTER — Other Ambulatory Visit: Payer: Self-pay | Admitting: Nurse Practitioner

## 2014-06-19 ENCOUNTER — Other Ambulatory Visit: Payer: Self-pay | Admitting: *Deleted

## 2014-06-19 DIAGNOSIS — F32A Depression, unspecified: Secondary | ICD-10-CM

## 2014-06-19 DIAGNOSIS — C50411 Malignant neoplasm of upper-outer quadrant of right female breast: Secondary | ICD-10-CM

## 2014-06-19 DIAGNOSIS — R232 Flushing: Secondary | ICD-10-CM

## 2014-06-19 DIAGNOSIS — F329 Major depressive disorder, single episode, unspecified: Secondary | ICD-10-CM

## 2014-06-19 MED ORDER — BUPROPION HCL ER (XL) 150 MG PO TB24: 150.0000 mg | ORAL_TABLET | Freq: Every day | ORAL | Status: DC

## 2014-06-20 ENCOUNTER — Ambulatory Visit (HOSPITAL_BASED_OUTPATIENT_CLINIC_OR_DEPARTMENT_OTHER): Payer: 59 | Admitting: Oncology

## 2014-06-20 ENCOUNTER — Telehealth: Payer: Self-pay | Admitting: Oncology

## 2014-06-20 VITALS — BP 125/85 | HR 85 | Temp 98.0°F | Resp 18 | Ht 69.0 in | Wt 243.2 lb

## 2014-06-20 DIAGNOSIS — C50411 Malignant neoplasm of upper-outer quadrant of right female breast: Secondary | ICD-10-CM | POA: Diagnosis not present

## 2014-06-20 DIAGNOSIS — Z79810 Long term (current) use of selective estrogen receptor modulators (SERMs): Secondary | ICD-10-CM

## 2014-06-20 DIAGNOSIS — Z170 Estrogen receptor positive status [ER+]: Secondary | ICD-10-CM

## 2014-06-20 MED ORDER — TAMOXIFEN CITRATE 20 MG PO TABS: 20.0000 mg | ORAL_TABLET | Freq: Every day | ORAL | Status: AC

## 2014-06-20 NOTE — Progress Notes (Signed)
[facility]  [phone] Fax:([phone]) [phone]     ID: Jennifer Frazier DOB: 15-Oct-1975  MR#: [id number]  EUM#:[id number]  Patient Care Team: Sharilyn Sites, MD as PCP - General (Family Medicine) Chauncey Cruel, MD as Consulting Physician (Oncology) Thea Silversmith, MD as Consulting Physician (Radiation Oncology) [name], MD as Consulting Physician (General Surgery)   CHIEF COMPLAINT: Estrogen receptor positive stage I breast cancer  CURRENT TREATMENT: Tamoxifen   BREAST CANCER HISTORY: From the original intake note:  The patient had screening mammography at [facility] showing calcifications in the right breast, and was referred to the breast Center 07/06/2013 for additional views. Right diagnostic mammography confirmed a group of amorphous calcifications in the upper outer right breast measuring 1.2 cm.  Biopsy of this area 07/17/2013 showed ([id number]) ductal carcinoma in situ, high-grade, estrogen receptor 100% positive, and progesterone receptor 96% positive, both with strong staining intensity.  The patient was then referred to surgery and on 07/25/2013 underwent bilateral breast MRI. This showed a breast density category CEA. In the right breast there was an area of clumped nodular enhancement measuring 3 cm. There was also a 1.5 cm circumscribed oval mass in the medial portion of the right breast consistent with a fibroadenoma. The left breast was unremarkable, and there were no abnormal appearing lymph nodes.  On 08/08/2013 the patient underwent right lumpectomy and sentinel lymph node sampling. The pathology ([id number] was (showed, in addition to ductal carcinoma in situ, invasive ductal carcinoma, measuring 0.7 cm, grade 3, estrogen receptor 86% positive, progesterone receptor 89% positive, both with strong staining intensity, with an MIB-1 of 62%, and no HER-2 amplification. Both sentinel lymph nodes were clear. Margins were ample.    The patient's subsequent history is as detailed below  INTERVAL HISTORY: Jennifer Frazier returns today for follow up of her breast cancer, accompanied by her mother. Since her last visit here she completed her radiation treatments. Generally she did well with those. She did has some dry disclamation and some fatigue. Those problems are improving. She is now ready to consider antiestrogen therapy  REVIEW OF SYSTEMS: Jennifer Frazier tells me her hot flashes stopped a couple of weeks into radiation. It fell for a while like she was going to have a period. She had some cramps. She never actually did have menses however. She has some back pain which is not a new problem. She feels anxious but not depressed. She is very stressed about her job. A detailed review of systems today was otherwise noncontributory  PAST MEDICAL HISTORY: Past Medical History  Diagnosis Date  . Fibroids     uterine fibroids  . Anemia   . Thyroid disease   . Contact lens/glasses fitting     wears contacts or glasses  . Sleep apnea     has a cpap-does not always use it  . Cancer     Breast  . Radiation 04/04/14-05/21/14    Right upper-outer breast    PAST SURGICAL HISTORY: Past Surgical History  Procedure Laterality Date  . [place]  2012    urerine ablasion  . Eye surgery  2013    retinal tear-lazer-both  . Wisdom tooth extraction    . Breast surgery      lumpectomy 7/14  . Portacath placement Right 09/29/2013    Procedure: INSERTION PORT-A-CATH WITH ULTRA SOUND;  Surgeon: [name], MD;  Location: [facility];  Service: General;  Laterality: Right;  . Port a cath revision N/A 12/19/2013  Procedure: REPOSITION PORT;  Surgeon: [name], MD;  Location: [facility];  Service: General;  Laterality: N/A;    FAMILY HISTORY Family History  Problem Relation Age of Onset  . Cancer Father 41    colon  . Cancer Maternal Aunt 50    breast cancer  . Cancer Paternal Aunt 37    breast cancer  .  Cancer Maternal Grandmother 75    ovarian or uterine cancer  . Cancer Paternal Grandmother     unknown primary   the patient's father died at the age of 62, been diagnosed with colon cancer at the age of 47. The patient's mother is living, currently 26 years old. The patient has one brother, no sisters. One of the patient's mother's sisters was diagnosed with breast cancer at the age of 70 in one of the patient's father's mother was diagnosed with breast cancer at the age of 14. There is no history of ovarian cancer in the family.  GYNECOLOGIC HISTORY:  No LMP recorded. Menarche age 69, for the patient is GX P0. She was still having regular periods at the start of chemotherapy. She used oral contraceptives for approximately one year with no complications.  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 She is single and her mother lives with her.(The patient's mother is wheelchair-bound secondary to a stroke).    ADVANCED DIRECTIVES: Not in place; at the 08/24/2013 visit the patient was given the appropriate documents to come feel notarize associate may declare healthcare power of attorney.   HEALTH MAINTENANCE: History  Substance Use Topics  . Smoking status: Never Smoker   . Smokeless tobacco: Not on file  . Alcohol Use: No     Colonoscopy:  PAP:  Bone density:  Lipid panel:  Allergies  Allergen Reactions  . Gadolinium      Desc: NAUSEA WITH MRI CONTRAST-NO VOMITING   . Tramadol Hives and Nausea Only    Current Outpatient Prescriptions  Medication Sig Dispense Refill  . buPROPion (WELLBUTRIN XL) 150 MG 24 hr tablet Take 1 tablet (150 mg total) by mouth daily. Started on 04/28/14 30 tablet 1  . Cholecalciferol (VITAMIN D3) 3000 UNITS TABS Take 10,000 Units by mouth daily.    . diphenhydramine-acetaminophen (TYLENOL PM) 25-500 MG TABS Take 1 tablet by mouth at bedtime as needed (pain/headache).    [name] emollient (BIAFINE) cream Apply  topically 2 (two) times daily.    [name] gabapentin (NEURONTIN) 300 MG capsule Take 1 capsule (300 mg total) by mouth at bedtime. 30 capsule 3  . hydrochlorothiazide (HYDRODIURIL) 25 MG tablet take 1 tablet by mouth once daily 30 tablet 1  . ibuprofen (ADVIL,MOTRIN) 100 MG/5ML suspension Take 200 mg by mouth every 4 (four) hours as needed.    [name] levothyroxine (SYNTHROID, LEVOTHROID) 88 MCG tablet Take 88 mcg by mouth daily.  0  . vitamin B-12 (CYANOCOBALAMIN) 1000 MCG tablet Take 5,000 mcg by mouth daily.     No current facility-administered medications for this visit.    OBJECTIVE: Middle-aged [place] American woman who appears well Filed Vitals:   06/20/14 1030  BP: 125/85  Pulse: 85  Temp: 98 F (36.7 C)  Resp: 18     Body mass index is 35.9 kg/(m^2).    ECOG FS:0 - Asymptomatic  Sclerae unicteric, pupils round and equal Oropharynx clear and moist-- no thrush or other lesions No cervical or supraclavicular adenopathy Lungs no rales or rhonchi Heart regular rate and rhythm Abd  soft, nontender, positive bowel sounds MSK no focal spinal tenderness, no upper extremity lymphedema Neuro: nonfocal, well oriented, appropriate affect Breasts: Deferred  LAB RESULTS:  CMP     Component Value Date/Time   NA 141 06/13/2014 1209   NA 131* 02/02/2014 0835   K 4.1 06/13/2014 1209   K 4.2 02/02/2014 0835   CL 101 02/02/2014 0835   CO2 29 06/13/2014 1209   CO2 24 02/02/2014 0835   GLUCOSE 80 06/13/2014 1209   GLUCOSE 93 02/02/2014 0835   BUN 15.4 06/13/2014 1209   BUN 17 02/02/2014 0835   CREATININE 1.0 06/13/2014 1209   CREATININE 0.68 02/02/2014 0835   CALCIUM 10.4 06/13/2014 1209   CALCIUM 9.4 02/02/2014 0835   PROT 7.4 06/13/2014 1209   PROT 6.7 10/12/2013 1628   ALBUMIN 4.0 06/13/2014 1209   ALBUMIN 3.3* 10/12/2013 1628   AST 17 06/13/2014 1209   AST 15 10/12/2013 1628   ALT 12 06/13/2014 1209   ALT 16 10/12/2013 1628   ALKPHOS 113 06/13/2014 1209   ALKPHOS 93 10/12/2013  1628   BILITOT 0.70 06/13/2014 1209   BILITOT 0.4 10/12/2013 1628   GFRNONAA >90 02/02/2014 0835   GFRAA >90 02/02/2014 0835    I No results found for: SPEP  Lab Results  Component Value Date   WBC 3.6* 06/13/2014   NEUTROABS 2.8 06/13/2014   HGB 14.2 06/13/2014   HCT 43.1 06/13/2014   MCV 81.7 06/13/2014   PLT 175 06/13/2014      Chemistry      Component Value Date/Time   NA 141 06/13/2014 1209   NA 131* 02/02/2014 0835   K 4.1 06/13/2014 1209   K 4.2 02/02/2014 0835   CL 101 02/02/2014 0835   CO2 29 06/13/2014 1209   CO2 24 02/02/2014 0835   BUN 15.4 06/13/2014 1209   BUN 17 02/02/2014 0835   CREATININE 1.0 06/13/2014 1209   CREATININE 0.68 02/02/2014 0835      Component Value Date/Time   CALCIUM 10.4 06/13/2014 1209   CALCIUM 9.4 02/02/2014 0835   ALKPHOS 113 06/13/2014 1209   ALKPHOS 93 10/12/2013 1628   AST 17 06/13/2014 1209   AST 15 10/12/2013 1628   ALT 12 06/13/2014 1209   ALT 16 10/12/2013 1628   BILITOT 0.70 06/13/2014 1209   BILITOT 0.4 10/12/2013 1628       No results found for: LABCA2  No components found for: LABCA125  No results for input(s): INR in the last 168 hours.  Urinalysis    Component Value Date/Time   COLORURINE YELLOW 02/02/2014 0845   APPEARANCEUR CLEAR 02/02/2014 0845   LABSPEC 1.009 02/02/2014 0845   PHURINE 6.0 02/02/2014 0845   GLUCOSEU NEGATIVE 02/02/2014 0845   HGBUR NEGATIVE 02/02/2014 0845   BILIRUBINUR NEGATIVE 02/02/2014 0845   KETONESUR NEGATIVE 02/02/2014 0845   PROTEINUR NEGATIVE 02/02/2014 0845   UROBILINOGEN 0.2 02/02/2014 0845   NITRITE NEGATIVE 02/02/2014 0845   LEUKOCYTESUR TRACE* 02/02/2014 0845    STUDIES: No results found.  ASSESSMENT: 39 y.o. BRCA negative Jennifer Frazier woman status post right breast biopsy 07/17/2013 for high-grade ductal carcinoma in situ, estrogen and progesterone receptor positive  (1) status post right lumpectomy and sentinel lymph node sampling 08/08/2013 for a pT1b pN0,  stage IA invasive ductal carcinoma, grade 3, estrogen and progesterone receptor positive, with an MIB-1 of 62% and no HER-2 amplification  (2) genetics sent 08/02/2013 was normal but did identify a variant of uncertain significance called BRCA2, [id number].  (  3) Oncotype score of 23 predicts a risk of outside the breast recurrence within 10 years of 15% if the patient's only systemic treatment is tamoxifen for 5 years. Adjuvant chemotherapy was recommended  (4) doxorubicin and cyclophosphamide in dose dense fashion x4  Completed 11/16/2013, followed by paclitaxel weekly x12  Completed 03/08/2014  (5) adjuvant radiation 3/9/206-05/21/2014:  Right breast / 45 Gray @ 1.8 Pearline Cables per fraction x 25 fractions Right breast boost / 16 Gray at [employer] per fraction x 8 fractions  (6) tamoxifen started 06/20/2014   PLAN: Dallis did well with her radiation and is now ready to start anti-estrogens. She is still pre/perimenopausal, so we are going to start with tamoxifen. We did briefly consider using Zoladex in addition. The benefit in the SOFT/TEXT studies primarily accrued to women under 28. She does not feel ready to "get into menopause" at this point. We will leave that question open for now.  We did discuss the possible toxicities, side effects and complications of tamoxifen. She is going to start after the July 4 holiday, because she is still recovering from radiation and because there is a great deal of stress in her life now related to her work.  Accordingly she will see me late September. By that time she will have been on the drug almost 3 months and if she is having significant side effects that should have surfaced by then. She will also resume mammography shortly before that visit.  The overall plan, then, is to continue tamoxifen for 2-3 years and then consider switching to an aromatase inhibitors. She will need at least 5 years of antiestrogen, but possibly more depending on when she enters  menopause and when we switched to anastrozole.  Kamylle has a good understanding of the overall plan. She agrees with it. She knows the goal of treatment in her case is cure. She will call with any problems that may develop before her next visit here.   Chauncey Cruel, MD   06/20/2014 10:59 AM

## 2014-06-20 NOTE — Progress Notes (Signed)
   Department of Radiation Oncology  Phone:  [phone] Fax:        [phone]   Name: Jennifer Frazier MRN: [id number]  DOB: [birth year]  Date: 06/21/2014  Follow Up Visit Note  Diagnosis: Breast cancer of upper-outer quadrant of right female breast   Staging form: Breast, AJCC 7th Edition     Pathologic: Stage IA (T1b, N0, cM0) - Signed by Thea Silversmith, MD on 08/30/2013  Summary and Interval since last radiation: 05/21/2014 Right breast / 45 Gray @ 1.8 Pearline Cables per fraction x 25 fractions Right breast boost / 16 Gray at [employer] per fraction x 8 fractions  Interval History: Jennifer Frazier presents today for routine followup. Healing well from radiation. Has started tamoxifen after discussion with Dr. Jana Hakim. She started back to work. Her breast has healed well. The pt denies pain. She is not using any lotion over the treatment site. The pt reports a good appetite and energy levels. She is scheduled to start Tamoxifen on 07/31/14 and a mammogram is scheduled for 09/27/14. She has a f/u with Dr. Jana Hakim on 10/17/14. She noted she is growing hair on her face and her periods have not returned. She has a OBGYN appointment in a couple of weeks. She is still looking for a job. She requested to continue her disability for now until she can find a job.  Physical Exam:  Filed Vitals:   06/21/14 1311  BP: 133/95  Pulse: 97  Temp: 97.9 F (36.6 C)  TempSrc: Oral  Resp: 20  Weight: 244 lb 9.6 oz (110.95 kg)  Tanning of the right breast. Alert and oriented times 3.   IMPRESSION: Jennifer Frazier is a 39 y.o. female with T1N0 right breast cancer with status post radiation treatment  PLAN: She is doing well. We discussed the need for follow up every 4-6 months which she has scheduled.  We discussed the need for yearly mammograms which she can schedule with her OBGYN or with medical oncology. We discussed the need for sun protection in the treated area.  She can always call me with questions.  I will follow up with her on  an as needed basis. I advised her to talk with her PCP about a diagnosis of polysystic ovarian syndrome which could be causing some of the symptoms she has. She is to continue her f/u with Dr. Jana Hakim. She will meet with our survivorship navigator after this encounter.  This document serves as a record of services personally performed by Thea Silversmith, MD. It was created on her behalf by Darcus Austin, a trained medical scribe. The creation of this record is based on the scribe's personal observations and the provider's statements to them. This document has been checked and approved by the attending provider.   Thea Silversmith, MD

## 2014-06-20 NOTE — Telephone Encounter (Signed)
Gave avs & calendar for September °

## 2014-06-21 ENCOUNTER — Ambulatory Visit (HOSPITAL_BASED_OUTPATIENT_CLINIC_OR_DEPARTMENT_OTHER): Payer: 59 | Admitting: Adult Health

## 2014-06-21 ENCOUNTER — Ambulatory Visit
Admission: RE | Admit: 2014-06-21 | Discharge: 2014-06-21 | Disposition: A | Discharge status: Home / Self Care | Payer: 59 | Source: Ambulatory Visit | Attending: Radiation Oncology | Admitting: Radiation Oncology

## 2014-06-21 ENCOUNTER — Encounter: Payer: Self-pay | Admitting: Adult Health

## 2014-06-21 ENCOUNTER — Encounter: Payer: Self-pay | Admitting: Radiation Oncology

## 2014-06-21 VITALS — BP 133/95 | HR 97 | Temp 97.9°F | Resp 20

## 2014-06-21 VITALS — BP 133/95 | HR 97 | Temp 97.9°F | Resp 20 | Wt 244.6 lb

## 2014-06-21 DIAGNOSIS — Z170 Estrogen receptor positive status [ER+]: Secondary | ICD-10-CM | POA: Diagnosis not present

## 2014-06-21 DIAGNOSIS — C50411 Malignant neoplasm of upper-outer quadrant of right female breast: Secondary | ICD-10-CM

## 2014-06-21 DIAGNOSIS — G629 Polyneuropathy, unspecified: Secondary | ICD-10-CM

## 2014-06-21 DIAGNOSIS — F419 Anxiety disorder, unspecified: Secondary | ICD-10-CM

## 2014-06-21 DIAGNOSIS — F4323 Adjustment disorder with mixed anxiety and depressed mood: Secondary | ICD-10-CM | POA: Diagnosis not present

## 2014-06-21 DIAGNOSIS — F5105 Insomnia due to other mental disorder: Secondary | ICD-10-CM | POA: Diagnosis not present

## 2014-06-21 MED ORDER — BUSPIRONE HCL 10 MG PO TABS: 10.0000 mg | ORAL_TABLET | Freq: Every evening | ORAL | Status: DC | PRN

## 2014-06-21 NOTE — Progress Notes (Signed)
Follow up right breast rad ts 04/04/14-05/21/14, breast well healed,, skin intact, slight tanning still,  No pain, not using any lotion encouarged to start lotion with vitamin E, appetite good, energy level good stated will start Tamoxifen 07/31/14 mammogram scheduled for 09/27/14, f/u Dr. Jana Hakim 10/17/14, BP 133/95 mmHg  Pulse 97  Temp(Src) 97.9 F (36.6 C) (Oral)  Resp 20  Wt 244 lb 9.6 oz (110.95 kg)  Wt Readings from Last 3 Encounters:  06/20/14 243 lb 3.2 oz (110.315 kg)  05/15/14 244 lb 1.6 oz (110.723 kg)  05/08/14 243 lb (110.224 kg)

## 2014-06-21 NOTE — Progress Notes (Signed)
CLINIC:  Cancer Survivorship   REASON FOR VISIT:  Routine follow-up post-treatment for a recent history of breast cancer.  BRIEF ONCOLOGIC HISTORY:    Breast cancer of upper-outer quadrant of right female breast   07/17/2013 Initial Diagnosis Breast cancer of upper-outer quadrant of right female breast   07/17/2013 Initial Biopsy Right breast needle core biopsy: Grade 3, DCIS with comedo-type necrosis.  ER+ (100%), PR+ (96%).    07/25/2013 Breast MRI Right breast (UOQ): 3 cm clumped nodular NME with associated clip artifact, corresponds with recently diagnosed DCIS. Marked bilateral parenchymal enhancing foci. No evidence of adenopathy. No worrisome foci in left breast.    08/02/2013 Procedure Genetic counseling/testing: Revealed 1 VUS on BRCA2 gene: p.E3309K (c.9925G>A).  Otherwise, 24 gene panel with [name] was (-).    08/08/2013 Oncotype testing Recurrence score: 23 (15% ROR). Adjuvant chemotherapy planned (Magrinat)   08/08/2013 Definitive Surgery Right breast lumpectomy with SLNB ([place]): Grade 3 IDC, spanning 0.7 cm.  Also grade 3 DCIS. Negative margins.  2 right axillary SLN removed and both were benign. ER+ (86%), PR+ (89), HER2- (ratio 1.07). Ki67 62%.    08/08/2013 Pathologic Stage pT1b, pN0: Stage IA   09/26/2013 Echocardiogram Pre-chemo EF: 55-60%.    10/05/2013 - 11/16/2013 Adjuvant Chemotherapy Adriamycin & Cytoxan x 4 cycles completed. (Magrinat)   10/10/2013 Imaging CT c/a/p: No axillary lymphadenopathy. No evidence of mets in the chest, abdomen, or pelvis.    11/30/2013 - 03/08/2014 Adjuvant Chemotherapy Taxol x 12 cycles completed. (Magrinat)   04/04/2014 - 05/21/2014 Radiation Therapy Adjuvant RT completed Pablo Ledger).  Right breast: Total dose 45 Gy over 25 fractions. Right breast boost: Total dose 16 Gy over 8 fractions.     Anti-estrogen oral therapy Planned duration of anti-estrogen therapy is 5-10 years. Pt will start with Tamoxifen in 07/2014 and may transition to aromatase  inhibitor in 2-3 years (Magrinat).    06/21/2014 Survivorship Survivorship Care Plan given to patient and reviewed with her in person.      INTERVAL HISTORY:  Ms. Kneeland presents to the [facility] today for our initial meeting to review her survivorship care plan detailing her treatment course for breast cancer, as well as monitoring long-term side effects of that treatment, education regarding health maintenance, screening, and overall wellness and health promotion.     Overall, Ms. Bouwens reports feeling quite well since completing her radiation therapy approximately one month ago.  She reports that she has not been sleeping well, as she is worried about her current employment situation.  Her job at [employer] was filled by someone else while she was out of work on [employer] for her cancer treatments.  Now that she has completed treatment, she has been told that she has 30 days to find a new position within [employer] or she will likely be terminated.  This has caused Ms. Dewilde a lot of stress, anxiety, sadness, and worry.  As a result, she is not sleeping well.  She does have a wonderful support system in her family and friends. Her mother is present with her today in clinic.  Ms. Gfeller states "the cancer hasn't been that bad. I've been able to complete all of the treatments without a ton of side effects, but now I'm really just worried about my job."   She does endorse some peripheral neuropathy in her fingertips, that she describes as only slightly painful, more like a "pressure" type pain.  It does not affect her hand dexterity or fine motor  skills.  The neuropathy has improved with the gabapentin and continues to get better the further she gets out from chemotherapy.  She is looking to become more physically fit, lose weight, and eat a healthy diet now that she has completed treatment.    REVIEW OF SYSTEMS:  General: Denies fever, chills, unintentional weight loss.  Cardiac:  Denies palpitations, chest pain, and lower extremity edema.  Respiratory: Denies cough, shortness of breath, and dyspnea on exertion.  GI: Denies abdominal pain, constipation, diarrhea, nausea, or vomiting.  GU: Denies dysuria, hematuria, vaginal bleeding, vaginal discharge, or vaginal dryness.  Musculoskeletal: Denies joint or bone pain.  Neuro: Denies headache or recent falls. Skin: Denies rash, pruritis, or open wounds.  Breast: Denies any new nodularity, masses, tenderness, nipple changes, or nipple discharge.  Psych: As above.   A 14-point review of systems was completed and was negative, except as noted above.   ONCOLOGY TREATMENT TEAM:  1. Surgeon:  Dr. Brantley Stage at [facility] 2. Medical Oncologist: Dr. Jana Hakim  3. Radiation Oncologist: Dr. Pablo Ledger    PAST MEDICAL/SURGICAL HISTORY:  Past Medical History  Diagnosis Date  . Fibroids     uterine fibroids  . Anemia   . Thyroid disease   . Contact lens/glasses fitting     wears contacts or glasses  . Sleep apnea     has a cpap-does not always use it  . Cancer     Breast  . Radiation 04/04/14-05/21/14    Right upper-outer breast   Past Surgical History  Procedure Laterality Date  . [place]  2012    urerine ablasion  . Eye surgery  2013    retinal tear-lazer-both  . Wisdom tooth extraction    . Breast surgery      lumpectomy 7/14  . Portacath placement Right 09/29/2013    Procedure: INSERTION PORT-A-CATH WITH ULTRA SOUND;  Surgeon: [name], MD;  Location: [place];  Service: General;  Laterality: Right;  . Port a cath revision N/A 12/19/2013    Procedure: REPOSITION PORT;  Surgeon: [name], MD;  Location: [place];  Service: General;  Laterality: N/A;     ALLERGIES:  Allergies  Allergen Reactions  . Gadolinium      Desc: NAUSEA WITH MRI CONTRAST-NO VOMITING   . Tramadol Hives and Nausea Only     CURRENT MEDICATIONS:  Current Outpatient Prescriptions on  File Prior to Visit  Medication Sig Dispense Refill  . buPROPion (WELLBUTRIN XL) 150 MG 24 hr tablet Take 1 tablet (150 mg total) by mouth daily. Started on 04/28/14 30 tablet 1  . Cholecalciferol (VITAMIN D3) 3000 UNITS TABS Take 10,000 Units by mouth daily.    . diphenhydramine-acetaminophen (TYLENOL PM) 25-500 MG TABS Take 1 tablet by mouth at bedtime as needed (pain/headache).    [name] emollient (BIAFINE) cream Apply topically 2 (two) times daily.    [name] gabapentin (NEURONTIN) 300 MG capsule Take 1 capsule (300 mg total) by mouth at bedtime. 30 capsule 3  . hydrochlorothiazide (HYDRODIURIL) 25 MG tablet take 1 tablet by mouth once daily 30 tablet 1  . ibuprofen (ADVIL,MOTRIN) 100 MG/5ML suspension Take 200 mg by mouth every 4 (four) hours as needed.    [name] levothyroxine (SYNTHROID, LEVOTHROID) 88 MCG tablet Take 88 mcg by mouth daily.  0  . tamoxifen (NOLVADEX) 20 MG tablet Take 1 tablet (20 mg total) by mouth daily. (Patient not taking: Reported on 06/21/2014) 90 tablet 12  . vitamin B-12 (CYANOCOBALAMIN) 1000  MCG tablet Take 5,000 mcg by mouth daily.     No current facility-administered medications on file prior to visit.     ONCOLOGIC FAMILY HISTORY:  Family History  Problem Relation Age of Onset  . Cancer Father 59    colon  . Cancer Maternal Aunt 50    breast cancer  . Cancer Paternal Aunt 25    breast cancer  . Cancer Maternal Grandmother 26    ovarian or uterine cancer  . Cancer Paternal Grandmother     unknown primary     GENETIC COUNSELING/TESTING: Completed 08/02/13.  Revealed 1 "Variant of Unknown Significance" in BRCA2 gene: p.E3309K (c.9925G>A).  Otherwise, the genetic testing was normal.  Genes tested included: ATM, BARD1, BRCA1, BRCA2, BRIP1, CDH1, CHEK2, EPCAM, MLH1, MRE11A, MSH2, MSH6, MUTYH, NBN, NF1, PALB2, PMS2, PTEN, RAD50, RAD51C, RAD51D, SMARCA4, STK11, and TP53.   SOCIAL HISTORY:  RAIDYN BREINER is single and lives in [place], [place].  She is currently out of  work. She previously worked at [employer].  She denies any current or history of tobacco, alcohol, or illicit drug use.     PHYSICAL EXAMINATION:  Vital Signs:   Filed Vitals:   06/21/14 1312  BP: 133/95  Pulse: 97  Temp: 97.9 F (36.6 C)  Resp: 20   General: Well-nourished, well-appearing female in no acute distress.  She is accompanied by her mother in clinic today.   HEENT: Head is atraumatic and normocephalic.  Pupils equal and reactive to light and accomodation. Conjunctivae clear without exudate.  Sclerae anicteric. Oral mucosa is pink, moist, and intact without lesions.  Oropharynx is pink without lesions or erythema.  Lymph: No cervical, supraclavicular, infraclavicular, or axillary lymphadenopathy noted on palpation.  Cardiovascular: Regular rate and rhythm without murmurs, rubs, or gallops. Respiratory: Clear to auscultation bilaterally. Chest expansion symmetric without accessory muscle use on inspiration or expiration.  GI: Abdomen soft and round. No tenderness to palpation. Bowel sounds normoactive in 4 quadrants. No hepatosplenomegaly.   GU: Deferred.  Musculoskeletal: Muscle strength 5/5 in all extremities.  Full ROM noted in all extremities.  Neuro: No focal deficits. Steady gait.  Psych: Mood and affect normal and appropriate for situation.  Extremities: No edema, cyanosis, or clubbing.  Skin: Warm and dry. No open lesions noted.   LABORATORY DATA:  None for this visit.  DIAGNOSTIC IMAGING:  None for this visit.     ASSESSMENT AND PLAN:   1. History of breast cancer:  Ms. Didonato will follow-up with her medical oncologist, Dr. Jana Hakim in 09/2014 with history and physical exam per surveillance protocol.  She will start her anti-estrogen therapy with Tamoxifen, as previously discussed and prescribed by Dr. Jana Hakim. She was instructed to make Dr. Jana Hakim or myself aware if she begins to experience any side effects of the medication and I could see her back in  clinic to help manage those side effects, as needed. Though the incidence is low, there is an associated risk of endometrial cancer with anti-estrogen therapies like Tamoxifen.  Ms. Mcguffin was encouraged to contact Dr. Jana Hakim or myself with any vaginal bleeding while taking Tamoxifen. Other side effects of Tamoxifen were again reviewed with her as well. A comprehensive survivorship care plan and treatment summary was reviewed with the patient today detailing her breast cancer diagnosis, treatment course, potential late/long-term effects of treatment, appropriate follow-up care with recommendations for the future, and patient education resources.  A copy of this summary, along with a letter will be sent to  the patient's primary care provider via mail/fax/In Basket message after today's visit.  Ms. Sandquist is welcome to return to the Survivorship Clinic in the future, as needed; no follow-up will be scheduled at this time.    2. Adjustment disorder with depressed and anxious mood: Aside from a cancer diagnosis and treatment, Ms. Bramel has had significant work-related stressors that have contributed to her difficulty with effectively coping.  She is worried about her finances in the wake of her job potentially being terminated at [employer] as a result of her absence due to her cancer diagnosis/treatments.  I offered support today for Ms. Kulesza by actively listening and providing reassurance of support.  I also have reached out to our cancer center social workers to see if they may be able to offer any additional resources or support for Ms. Doane during this very difficult time.  With regards to coping with the cancer diagnosis and treatment itself, she has relied on her faith and support from family and friends.  We discussed an opportunity for her to participate in the next session of [facility] ("Finding Your New Normal") support group series designed for patients after they have completed treatment.  Ms.  Polakowski agreed to participate in [facility] and I have placed a referral to get her registered for the next session in the Fall.  I also encouraged her to reach out to our Chaplain, Lorrin Jackson, as another wonderful resource for further exploration of her current difficulties and emotions.  I have given Ms. Woodfield Lisa's contact information and encouraged her to reach out to her if she chooses.  3. Insomnia secondary to anxiety: Largely, her current social/employment situation is causing Ms. Economos the most anxiety which is affecting her ability to sleep.  We discussed different options to help manage insomnia, including yoga, mindfulness, exercise, and medication.  I have prescribed Buspar 36m QHSprn for her to see if it will offer her some relief and allow her to have some restful sleep.  She agrees with this plan and knows to call me for additional questions or concerns regarding this medication.    4. Peripheral neuropathy:  The peripheral neuropathy Ms. DGauis experiencing is likely due to the taxane chemotherapy (Paclitaxel) she received as treatment for her breast cancer.  She is currently taking gabapentin which is offering her adequate control of her symptoms.  She is not having any motor deficits and has good hand dexterity and strength.   Given that her symptoms are improving with time, I am hopeful that she may regain much of her normal sensation in her fingertips. I have not made any changes to her current treatment regimen and encouraged her to continue to take the gabapentin as previously directed.    5. Bone health:  Given Ms. Horsley's history of breast cancer and her current treatment regimen including anti-estrogen therapy, she is at risk for bone demineralization.  She will be eligible for a DEXA scan when she is 39years old, or sooner if clinically indicated.  In the meantime, she was encouraged to increase her consumption of foods rich in calcium, as well as increase her weight-bearing  activities.  She was given education on specific activities to promote bone health.  6. Cancer screening:  Due to Ms. Dain's history and her age, she should receive screening for skin cancers, colon cancer, and gynecologic cancers.  The information and recommendations are listed on the patient's comprehensive care plan/treatment summary and were reviewed in detail with the  patient.    7. Health maintenance and wellness promotion: Ms. Hogen was encouraged to consume 5-7 servings of fruits and vegetables per day. She was also encouraged to engage in moderate to vigorous exercise for 30 minutes per day most days of the week. We discussed the [employer] fitness program, which is designed for cancer survivors to help them become more physically fit after cancer treatments.  She was encouraged to participate and was given a Engineer, civil (consulting).  She will contact their coordinator if she decides to participate.  She was instructed to limit her alcohol consumption and continue to abstain from tobacco use.    8. Support services/counseling: It is not uncommon for this period of the patient's cancer care trajectory to be one of many emotions and stressors.   Ms. Markin was encouraged to take advantage of our many other support services programs, support groups, and/or counseling in coping with her new life as a cancer survivor after completing anti-cancer treatment.  She was offered support today through active listening and expressive supportive counseling.  She was given information regarding our available services and encouraged to contact me with any questions or for help enrolling in any of our support group/programs.    A total of 60 minutes of face-to-face time was spent with this patient with greater than 50% of that time in counseling and care-coordination.   Mike Craze, NP Survivorship Program [facility] [phone]   Note: PRIMARY CARE PROVIDER Sharilyn Sites Annetta,  [place] [phone]

## 2014-06-27 ENCOUNTER — Encounter: Payer: Self-pay | Admitting: *Deleted

## 2014-06-27 NOTE — Progress Notes (Signed)
[place] Work  Clinical Social Work was referred by nurse for assessment of psychosocial needs due to employment concerns and adjustment to surviviorship.  Clinical Social Worker attempted to contact patient at home to offer support and assess for needs.  CSW left message for pt to return CSW call.   Loren Racer, [place] Worker [place] Phone: [phone] Fax: [phone]

## 2014-06-29 ENCOUNTER — Encounter: Payer: Self-pay | Admitting: *Deleted

## 2014-06-29 NOTE — Progress Notes (Signed)
[place] Work  Clinical Social Work was referred by nurse for assessment of psychosocial needs due to employment concerns.  Clinical Social Worker spoke with patient via phone to offer support and assess for needs. Pt explained current situation about her job at [employer] CSW provided info re. Cancer and Careers. Pt appears to not qualify for many community resources currently, but could qualify for the [employer]. Pt to review and then get back with CSW.   Clinical Social Work interventions: Supportive listening Resource education Loren Racer, [place] Worker [place]  [place] Phone: [phone] Fax: [phone]

## 2014-07-09 ENCOUNTER — Ambulatory Visit: Payer: Self-pay | Admitting: Surgery

## 2014-07-09 NOTE — H&P (Signed)
Jennifer Frazier 07/09/2014 9:06 AM Location: [facility] Patient #: [id number] DOB: 06-Oct-1975 Single / Language: [name] / Race: Black or African American Female History of Present Illness [name] A. [name] MD; 07/09/2014 9:30 AM) Patient words: Discuss port removal        Pt here to discuss port removal today   doing well no complaints at all feels wel     39 y.o. BRCA negative [place] woman status post right breast biopsy 07/17/2013 for high-grade ductal carcinoma in situ, estrogen and progesterone receptor positive  (1) status post right lumpectomy and sentinel lymph node sampling 08/08/2013 for a pT1b pN0, stage IA invasive ductal carcinoma, grade 3, estrogen and progesterone receptor positive, with an MIB-1 of 62% and no HER-2 amplification  (2) genetics sent 08/02/2013 was normal but did identify a variant of uncertain significance called BRCA2, p.[id number].  (3) Oncotype score of 23 predicts a risk of outside the breast recurrence within 10 years of 15% if the patient's only systemic treatment is tamoxifen for 5 years. Adjuvant chemotherapy was recommended  (4) doxorubicin and cyclophosphamide in dose dense fashion x4 Completed 11/16/2013, followed by paclitaxel weekly x12 Completed 03/08/2014  (5) adjuvant radiation 3/9/206-05/21/2014:  Right breast / 45 Gray @ 1.8 Pear[place] Cables per fraction x 25 fractions Right breast boost / 16 Gray at [employer] per fraction x 8 fractions.  The patient is a 38 year old female   Medication History Ivor Costa, CMA; 07/09/2014 9:08 AM) BuPROPion HCl ER (XL) (150MG Tablet ER 24HR, Oral) Active. BusPIRone HCl (10MG Tablet, Oral) Active. Hydrochlorothiazide (25MG Tablet, Oral) Active. Losartan Potassium-HCTZ (100-25MG Tablet, Oral) Active. Levothyroxine Sodium (88MCG Tablet, Oral) Active. Tamoxifen Citrate (20MG Tablet, Oral) Active. Venlafaxine HCl (37.5MG Tablet, Oral) Active.    Vitals Ivor Costa CMA;  07/09/2014 9:07 AM) 07/09/2014 9:07 AM Weight: 243.2 lb Temp.: 26F(Temporal)  Pulse: 80 (Regular)  Resp.: 16 (Unlabored)  BP: 132/86 (Sitting, Left Arm, Standard)     Physical Exam (Shronda Boeh A. Tyree Vandruff MD; 07/09/2014 9:31 AM)  General Mental Status-Alert. General Appearance-Consistent with stated age. Hydration-Well hydrated. Voice-Normal.  Head and Neck Head-normocephalic, atraumatic with no lesions or palpable masses. Trachea-midline. Thyroid Gland Characteristics - normal size and consistency.  Chest and Lung Exam Note: port ride side in place   Neurologic Neurologic evaluation reveals -alert and oriented x 3 with no impairment of recent or remote memory. Mental Status-Normal.  Musculoskeletal Normal Exam - Left-Upper Extremity Strength Normal and Lower Extremity Strength Normal. Normal Exam - Right-Upper Extremity Strength Normal and Lower Extremity Strength Normal.    Assessment & Plan (Olyn Landstrom A. Ebrahim Deremer MD; 07/09/2014 9:31 AM)  PORT-A-CATH IN PLACE (V45.89  L57.262) Impression: port to be removed risk of bleeding infection catheter migration. pt agrees to proceed  Current Plans Pt Education - [facility] Free Text Education/Instructions: discussed with patient and provided information. HISTORY OF BREAST CANCER (V10.3  Z85.3)

## 2014-07-25 ENCOUNTER — Other Ambulatory Visit: Payer: Self-pay | Admitting: *Deleted

## 2014-07-25 DIAGNOSIS — R232 Flushing: Secondary | ICD-10-CM

## 2014-07-25 DIAGNOSIS — C50411 Malignant neoplasm of upper-outer quadrant of right female breast: Secondary | ICD-10-CM

## 2014-07-25 DIAGNOSIS — F329 Major depressive disorder, single episode, unspecified: Secondary | ICD-10-CM

## 2014-07-25 DIAGNOSIS — F32A Depression, unspecified: Secondary | ICD-10-CM

## 2014-07-25 MED ORDER — BUPROPION HCL ER (XL) 150 MG PO TB24: 150.0000 mg | ORAL_TABLET | Freq: Every day | ORAL | Status: DC

## 2014-07-25 MED ORDER — HYDROCHLOROTHIAZIDE 25 MG PO TABS: 25.0000 mg | ORAL_TABLET | Freq: Every day | ORAL | Status: DC

## 2014-08-03 ENCOUNTER — Encounter (HOSPITAL_BASED_OUTPATIENT_CLINIC_OR_DEPARTMENT_OTHER): Payer: Self-pay | Admitting: *Deleted

## 2014-08-07 ENCOUNTER — Other Ambulatory Visit: Payer: Self-pay | Admitting: Oncology

## 2014-08-07 ENCOUNTER — Encounter (HOSPITAL_BASED_OUTPATIENT_CLINIC_OR_DEPARTMENT_OTHER)
Admission: RE | Admit: 2014-08-07 | Discharge: 2014-08-07 | Disposition: A | Discharge status: Home / Self Care | Payer: 59 | Source: Ambulatory Visit | Attending: Surgery | Admitting: Surgery

## 2014-08-07 ENCOUNTER — Telehealth: Payer: Self-pay | Admitting: *Deleted

## 2014-08-07 DIAGNOSIS — I10 Essential (primary) hypertension: Secondary | ICD-10-CM | POA: Diagnosis not present

## 2014-08-07 DIAGNOSIS — Z9989 Dependence on other enabling machines and devices: Secondary | ICD-10-CM | POA: Diagnosis not present

## 2014-08-07 DIAGNOSIS — E039 Hypothyroidism, unspecified: Secondary | ICD-10-CM | POA: Diagnosis not present

## 2014-08-07 DIAGNOSIS — Z79899 Other long term (current) drug therapy: Secondary | ICD-10-CM | POA: Diagnosis not present

## 2014-08-07 DIAGNOSIS — F329 Major depressive disorder, single episode, unspecified: Secondary | ICD-10-CM | POA: Diagnosis not present

## 2014-08-07 DIAGNOSIS — Z853 Personal history of malignant neoplasm of breast: Secondary | ICD-10-CM | POA: Diagnosis not present

## 2014-08-07 DIAGNOSIS — Z9221 Personal history of antineoplastic chemotherapy: Secondary | ICD-10-CM | POA: Diagnosis not present

## 2014-08-07 DIAGNOSIS — Z452 Encounter for adjustment and management of vascular access device: Secondary | ICD-10-CM | POA: Diagnosis not present

## 2014-08-07 DIAGNOSIS — G4730 Sleep apnea, unspecified: Secondary | ICD-10-CM | POA: Diagnosis not present

## 2014-08-07 DIAGNOSIS — F419 Anxiety disorder, unspecified: Secondary | ICD-10-CM | POA: Diagnosis not present

## 2014-08-07 DIAGNOSIS — Z79810 Long term (current) use of selective estrogen receptor modulators (SERMs): Secondary | ICD-10-CM | POA: Diagnosis not present

## 2014-08-07 DIAGNOSIS — E032 Hypothyroidism due to medicaments and other exogenous substances: Secondary | ICD-10-CM

## 2014-08-07 LAB — CBC WITH DIFFERENTIAL/PLATELET
Basophils Absolute: 0 10*3/uL (ref 0.0–0.1)
Basophils Relative: 0 % (ref 0–1)
EOS ABS: 0.1 10*3/uL (ref 0.0–0.7)
EOS PCT: 3 % (ref 0–5)
HCT: 43.2 % (ref 36.0–46.0)
HEMOGLOBIN: 14.7 g/dL (ref 12.0–15.0)
LYMPHS ABS: 1 10*3/uL (ref 0.7–4.0)
LYMPHS PCT: 21 % (ref 12–46)
MCH: 27.5 pg (ref 26.0–34.0)
MCHC: 34 g/dL (ref 30.0–36.0)
MCV: 80.9 fL (ref 78.0–100.0)
MONO ABS: 0.3 10*3/uL (ref 0.1–1.0)
Monocytes Relative: 6 % (ref 3–12)
NEUTROS PCT: 70 % (ref 43–77)
Neutro Abs: 3.3 10*3/uL (ref 1.7–7.7)
Platelets: 162 10*3/uL (ref 150–400)
RBC: 5.34 MIL/uL — AB (ref 3.87–5.11)
RDW: 13.6 % (ref 11.5–15.5)
WBC: 4.8 10*3/uL (ref 4.0–10.5)

## 2014-08-07 LAB — COMPREHENSIVE METABOLIC PANEL
ALK PHOS: 96 U/L (ref 38–126)
ALT: 14 U/L (ref 14–54)
AST: 19 U/L (ref 15–41)
Albumin: 4.1 g/dL (ref 3.5–5.0)
Anion gap: 9 (ref 5–15)
BUN: 16 mg/dL (ref 6–20)
CHLORIDE: 100 mmol/L — AB (ref 101–111)
CO2: 30 mmol/L (ref 22–32)
CREATININE: 0.92 mg/dL (ref 0.44–1.00)
Calcium: 10.1 mg/dL (ref 8.9–10.3)
GFR calc Af Amer: >60 mL/min (ref >60)
GFR calc non Af Amer: >60 mL/min (ref >60)
Glucose, Bld: 77 mg/dL (ref 65–99)
Potassium: 4.4 mmol/L (ref 3.5–5.1)
Sodium: 139 mmol/L (ref 135–145)
TOTAL PROTEIN: 7.7 g/dL (ref 6.5–8.1)
Total Bilirubin: 0.6 mg/dL (ref 0.3–1.2)

## 2014-08-07 NOTE — Telephone Encounter (Signed)
VM message received @ 3:11 pm. Pt states she is feeling very tired and fatigued. She stated that she thinks she needs her Welbutrin "tweaked". She is requesting an appt with Dr. Jana Hakim.

## 2014-08-07 NOTE — Telephone Encounter (Signed)
Please ask her to increase wellbutrin to 300 mg/day (2 tablets of 150 mg each daily) and call us with how she is doing in about 2 weeks. I wiill enter POF for pt to see me or HB in 4 weeks  Thanks!

## 2014-08-08 ENCOUNTER — Encounter (HOSPITAL_BASED_OUTPATIENT_CLINIC_OR_DEPARTMENT_OTHER): Payer: Self-pay | Admitting: Certified Registered"

## 2014-08-08 ENCOUNTER — Ambulatory Visit (HOSPITAL_BASED_OUTPATIENT_CLINIC_OR_DEPARTMENT_OTHER): Payer: 59 | Admitting: Certified Registered"

## 2014-08-08 ENCOUNTER — Encounter (HOSPITAL_BASED_OUTPATIENT_CLINIC_OR_DEPARTMENT_OTHER)
Admission: RE | Disposition: A | Discharge status: Home / Self Care | Payer: Self-pay | Source: Ambulatory Visit | Attending: Surgery

## 2014-08-08 ENCOUNTER — Telehealth: Payer: Self-pay | Admitting: Oncology

## 2014-08-08 ENCOUNTER — Ambulatory Visit (HOSPITAL_BASED_OUTPATIENT_CLINIC_OR_DEPARTMENT_OTHER)
Admission: RE | Admit: 2014-08-08 | Discharge: 2014-08-08 | Disposition: A | Discharge status: Home / Self Care | Payer: 59 | Source: Ambulatory Visit | Attending: Surgery | Admitting: Surgery

## 2014-08-08 DIAGNOSIS — Z452 Encounter for adjustment and management of vascular access device: Secondary | ICD-10-CM | POA: Insufficient documentation

## 2014-08-08 DIAGNOSIS — Z9221 Personal history of antineoplastic chemotherapy: Secondary | ICD-10-CM | POA: Insufficient documentation

## 2014-08-08 DIAGNOSIS — Z9989 Dependence on other enabling machines and devices: Secondary | ICD-10-CM | POA: Insufficient documentation

## 2014-08-08 DIAGNOSIS — Z79810 Long term (current) use of selective estrogen receptor modulators (SERMs): Secondary | ICD-10-CM | POA: Insufficient documentation

## 2014-08-08 DIAGNOSIS — Z79899 Other long term (current) drug therapy: Secondary | ICD-10-CM | POA: Insufficient documentation

## 2014-08-08 DIAGNOSIS — Z853 Personal history of malignant neoplasm of breast: Secondary | ICD-10-CM | POA: Insufficient documentation

## 2014-08-08 DIAGNOSIS — I10 Essential (primary) hypertension: Secondary | ICD-10-CM | POA: Insufficient documentation

## 2014-08-08 DIAGNOSIS — E039 Hypothyroidism, unspecified: Secondary | ICD-10-CM | POA: Insufficient documentation

## 2014-08-08 DIAGNOSIS — G4730 Sleep apnea, unspecified: Secondary | ICD-10-CM | POA: Insufficient documentation

## 2014-08-08 DIAGNOSIS — F419 Anxiety disorder, unspecified: Secondary | ICD-10-CM | POA: Insufficient documentation

## 2014-08-08 DIAGNOSIS — F329 Major depressive disorder, single episode, unspecified: Secondary | ICD-10-CM | POA: Insufficient documentation

## 2014-08-08 HISTORY — DX: Depression, unspecified: F32.A

## 2014-08-08 HISTORY — DX: Anxiety disorder, unspecified: F41.9

## 2014-08-08 HISTORY — DX: Major depressive disorder, single episode, unspecified: F32.9

## 2014-08-08 HISTORY — DX: Hypothyroidism, unspecified: E03.9

## 2014-08-08 HISTORY — PX: PORT-A-CATH REMOVAL: SHX5289

## 2014-08-08 HISTORY — DX: Essential (primary) hypertension: I10

## 2014-08-08 LAB — POCT HEMOGLOBIN-HEMACUE: HEMOGLOBIN: 14.6 g/dL (ref 12.0–15.0)

## 2014-08-08 SURGERY — REMOVAL PORT-A-CATH
Anesthesia: General | Site: Chest | Laterality: Right

## 2014-08-08 MED ORDER — BUPIVACAINE-EPINEPHRINE 0.25% -1:200000 IJ SOLN
INTRAMUSCULAR | Status: DC | PRN
  Administered 2014-08-08: 10 mL

## 2014-08-08 MED ORDER — SCOPOLAMINE 1 MG/3DAYS TD PT72: 1.0000 | MEDICATED_PATCH | Freq: Once | TRANSDERMAL | Status: DC | PRN

## 2014-08-08 MED ORDER — LACTATED RINGERS IV SOLN
INTRAVENOUS | Status: DC
Start: 2014-08-08 — End: 2014-08-08
  Administered 2014-08-08 (×2): via INTRAVENOUS

## 2014-08-08 MED ORDER — CEFAZOLIN SODIUM-DEXTROSE 2-3 GM-% IV SOLR
INTRAVENOUS | Status: AC
Start: 2014-08-08 — End: 2014-08-08
  Filled 2014-08-08: qty 50

## 2014-08-08 MED ORDER — CEFAZOLIN SODIUM-DEXTROSE 2-3 GM-% IV SOLR
2.0000 g | INTRAVENOUS | Status: AC
  Administered 2014-08-08: 2 g via INTRAVENOUS

## 2014-08-08 MED ORDER — GLYCOPYRROLATE 0.2 MG/ML IJ SOLN: 0.2000 mg | Freq: Once | INTRAMUSCULAR | Status: DC | PRN

## 2014-08-08 MED ORDER — ONDANSETRON HCL 4 MG/2ML IJ SOLN
INTRAMUSCULAR | Status: DC | PRN
  Administered 2014-08-08: 4 mg via INTRAVENOUS

## 2014-08-08 MED ORDER — FENTANYL CITRATE (PF) 100 MCG/2ML IJ SOLN
INTRAMUSCULAR | Status: AC
  Filled 2014-08-08: qty 4

## 2014-08-08 MED ORDER — FENTANYL CITRATE (PF) 100 MCG/2ML IJ SOLN
50.0000 ug | INTRAMUSCULAR | Status: DC | PRN
Start: 2014-08-08 — End: 2014-08-08
  Administered 2014-08-08 (×2): 50 ug via INTRAVENOUS

## 2014-08-08 MED ORDER — SUCCINYLCHOLINE CHLORIDE 20 MG/ML IJ SOLN
INTRAMUSCULAR | Status: AC
  Filled 2014-08-08: qty 1

## 2014-08-08 MED ORDER — MIDAZOLAM HCL 2 MG/2ML IJ SOLN
1.0000 mg | INTRAMUSCULAR | Status: DC | PRN
  Administered 2014-08-08: 2 mg via INTRAVENOUS

## 2014-08-08 MED ORDER — PROPOFOL 10 MG/ML IV BOLUS
INTRAVENOUS | Status: DC | PRN
  Administered 2014-08-08: 200 mg via INTRAVENOUS

## 2014-08-08 MED ORDER — PROPOFOL 500 MG/50ML IV EMUL
INTRAVENOUS | Status: AC
  Filled 2014-08-08: qty 50

## 2014-08-08 MED ORDER — BUPIVACAINE-EPINEPHRINE (PF) 0.25% -1:200000 IJ SOLN
INTRAMUSCULAR | Status: AC
  Filled 2014-08-08: qty 30

## 2014-08-08 MED ORDER — DEXAMETHASONE SODIUM PHOSPHATE 4 MG/ML IJ SOLN
INTRAMUSCULAR | Status: DC | PRN
  Administered 2014-08-08: 10 mg via INTRAVENOUS

## 2014-08-08 MED ORDER — IBUPROFEN 200 MG PO TABS: 600.0000 mg | ORAL_TABLET | Freq: Three times a day (TID) | ORAL | Status: DC | PRN

## 2014-08-08 MED ORDER — LIDOCAINE HCL (CARDIAC) 20 MG/ML IV SOLN
INTRAVENOUS | Status: DC | PRN
  Administered 2014-08-08: 60 mg via INTRAVENOUS

## 2014-08-08 MED ORDER — MEPERIDINE HCL 25 MG/ML IJ SOLN: 6.2500 mg | INTRAMUSCULAR | Status: DC | PRN

## 2014-08-08 MED ORDER — FENTANYL CITRATE (PF) 100 MCG/2ML IJ SOLN: 25.0000 ug | INTRAMUSCULAR | Status: DC | PRN

## 2014-08-08 MED ORDER — MIDAZOLAM HCL 2 MG/2ML IJ SOLN
INTRAMUSCULAR | Status: AC
  Filled 2014-08-08: qty 2

## 2014-08-08 SURGICAL SUPPLY — 27 items
BLADE SURG 15 STRL LF DISP TIS (BLADE) ×1 IMPLANT
BLADE SURG 15 STRL SS (BLADE) ×1
CHLORAPREP W/TINT 26ML (MISCELLANEOUS) ×2 IMPLANT
COVER BACK TABLE 60X90IN (DRAPES) ×2 IMPLANT
COVER MAYO STAND STRL (DRAPES) ×2 IMPLANT
DRAPE LAPAROTOMY 100X72 PEDS (DRAPES) ×2 IMPLANT
DRAPE UTILITY XL STRL (DRAPES) ×2 IMPLANT
ELECT REM PT RETURN 9FT ADLT (ELECTROSURGICAL) ×2
ELECTRODE REM PT RTRN 9FT ADLT (ELECTROSURGICAL) ×1 IMPLANT
GLOVE BIOGEL PI IND STRL 7.0 (GLOVE) ×1 IMPLANT
GLOVE BIOGEL PI IND STRL 8 (GLOVE) ×1 IMPLANT
GLOVE BIOGEL PI INDICATOR 7.0 (GLOVE) ×1
GLOVE BIOGEL PI INDICATOR 8 (GLOVE) ×1
GLOVE ECLIPSE 6.5 STRL STRAW (GLOVE) ×2 IMPLANT
GLOVE ECLIPSE 8.0 STRL XLNG CF (GLOVE) ×2 IMPLANT
GOWN STRL REUS W/ TWL LRG LVL3 (GOWN DISPOSABLE) ×2 IMPLANT
GOWN STRL REUS W/TWL LRG LVL3 (GOWN DISPOSABLE) ×2
LIQUID BAND (GAUZE/BANDAGES/DRESSINGS) ×2 IMPLANT
NEEDLE HYPO 25X1 1.5 SAFETY (NEEDLE) ×2 IMPLANT
PACK BASIN DAY SURGERY FS (CUSTOM PROCEDURE TRAY) ×2 IMPLANT
PENCIL BUTTON HOLSTER BLD 10FT (ELECTRODE) ×2 IMPLANT
SLEEVE SCD COMPRESS KNEE MED (MISCELLANEOUS) ×2 IMPLANT
SPONGE LAP 4X18 X RAY DECT (DISPOSABLE) ×2 IMPLANT
SUT MON AB 4-0 PC3 18 (SUTURE) ×2 IMPLANT
SUT VICRYL 3-0 CR8 SH (SUTURE) ×2 IMPLANT
SYR CONTROL 10ML LL (SYRINGE) ×2 IMPLANT
TOWEL OR 17X24 6PK STRL BLUE (TOWEL DISPOSABLE) ×2 IMPLANT

## 2014-08-08 NOTE — Telephone Encounter (Signed)
lvm for pt regarding to Aug appt... °

## 2014-08-08 NOTE — H&P (View-Only) (Signed)
Jennifer Frazier. Kuzniar 07/09/2014 9:06 AM Location: [facility] Patient #: [id number] DOB: March 04, 1975 Single / Language: Jennifer Frazier / Race: Black or African American Female History of Present Illness Jennifer Moores A. Damel Querry MD; 07/09/2014 9:30 AM) Patient words: Discuss port removal        Pt here to discuss port removal today   doing well no complaints at all feels wel     39 y.o. BRCA negative Jennifer Frazier woman status post right breast biopsy 07/17/2013 for high-grade ductal carcinoma in situ, estrogen and progesterone receptor positive  (1) status post right lumpectomy and sentinel lymph node sampling 08/08/2013 for a pT1b pN0, stage IA invasive ductal carcinoma, grade 3, estrogen and progesterone receptor positive, with an MIB-1 of 62% and no HER-2 amplification  (2) genetics sent 08/02/2013 was normal but did identify a variant of uncertain significance called BRCA2, p.[id number].  (3) Oncotype score of 23 predicts a risk of outside the breast recurrence within 10 years of 15% if the patient's only systemic treatment is tamoxifen for 5 years. Adjuvant chemotherapy was recommended  (4) doxorubicin and cyclophosphamide in dose dense fashion x4 Completed 11/16/2013, followed by paclitaxel weekly x12 Completed 03/08/2014  (5) adjuvant radiation 3/9/206-05/21/2014:  Right breast / 45 Gray @ 1.8 Jennifer Frazier per fraction x 25 fractions Right breast boost / 16 Gray at [employer] per fraction x 8 fractions.  The patient is a 39 year old female   Medication History Jennifer Frazier, CMA; 07/09/2014 9:08 AM) BuPROPion HCl ER (XL) (150MG Tablet ER 24HR, Oral) Active. BusPIRone HCl (10MG Tablet, Oral) Active. Hydrochlorothiazide (25MG Tablet, Oral) Active. Losartan Potassium-HCTZ (100-25MG Tablet, Oral) Active. Levothyroxine Sodium (88MCG Tablet, Oral) Active. Tamoxifen Citrate (20MG Tablet, Oral) Active. Venlafaxine HCl (37.5MG Tablet, Oral) Active.    Vitals Jennifer Frazier CMA;  07/09/2014 9:07 AM) 07/09/2014 9:07 AM Weight: 243.2 lb Temp.: 13F(Temporal)  Pulse: 80 (Regular)  Resp.: 16 (Unlabored)  BP: 132/86 (Sitting, Left Arm, Standard)     Physical Exam (Jennifer Sek A. Tadao Emig MD; 07/09/2014 9:31 AM)  General Mental Status-Alert. General Appearance-Consistent with stated age. Hydration-Well hydrated. Voice-Normal.  Head and Neck Head-normocephalic, atraumatic with no lesions or palpable masses. Trachea-midline. Thyroid Gland Characteristics - normal size and consistency.  Chest and Lung Exam Note: port ride side in place   Neurologic Neurologic evaluation reveals -alert and oriented x 3 with no impairment of recent or remote memory. Mental Status-Normal.  Musculoskeletal Normal Exam - Left-Upper Extremity Strength Normal and Lower Extremity Strength Normal. Normal Exam - Right-Upper Extremity Strength Normal and Lower Extremity Strength Normal.    Assessment & Plan (Jennifer Rossano A. Bryla Burek MD; 07/09/2014 9:31 AM)  PORT-A-CATH IN PLACE (V45.89  M22.633) Impression: port to be removed risk of bleeding infection catheter migration. pt agrees to proceed  Current Plans Pt Education - [facility] Free Text Education/Instructions: discussed with patient and provided information. HISTORY OF BREAST CANCER (V10.3  Z85.3)

## 2014-08-08 NOTE — Interval H&P Note (Signed)
History and Physical Interval Note:  08/08/2014 8:03 AM  Jennifer Frazier  has presented today for surgery, with the diagnosis of Port in Place  The various methods of treatment have been discussed with the patient and family. After consideration of risks, benefits and other options for treatment, the patient has consented to  Procedure(s): REMOVAL PORT-A-CATH (N/A) as a surgical intervention .  The patient's history has been reviewed, patient examined, no change in status, stable for surgery.  I have reviewed the patient's chart and labs.  Questions were answered to the patient's satisfaction.     Taffie Eckmann A.

## 2014-08-08 NOTE — Anesthesia Postprocedure Evaluation (Signed)
  Anesthesia Post-op Note  Patient: Jennifer Frazier  Procedure(s) Performed: Procedure(s): REMOVAL PORT-A-CATH (Right)  Patient Location: PACU  Anesthesia Type: General   Level of Consciousness: awake, alert  and oriented  Airway and Oxygen Therapy: Patient Spontanous Breathing  Post-op Pain: none  Post-op Assessment: Post-op Vital signs reviewed  Post-op Vital Signs: Reviewed  Last Vitals:  Filed Vitals:   08/08/14 0915  BP: 130/85  Pulse: 84  Temp:   Resp: 15    Complications: No apparent anesthesia complications

## 2014-08-08 NOTE — Anesthesia Procedure Notes (Signed)
Procedure Name: LMA Insertion Date/Time: 08/08/2014 8:25 AM Performed by: Jacayla Nordell D Pre-anesthesia Checklist: Patient identified, Emergency Drugs available, Suction available and Patient being monitored Patient Re-evaluated:Patient Re-evaluated prior to inductionOxygen Delivery Method: Circle System Utilized Preoxygenation: Pre-oxygenation with 100% oxygen Intubation Type: IV induction Ventilation: Mask ventilation without difficulty LMA: LMA inserted LMA Size: 4.0 Number of attempts: 1 Airway Equipment and Method: Bite block Placement Confirmation: positive ETCO2 Tube secured with: Tape Dental Injury: Teeth and Oropharynx as per pre-operative assessment

## 2014-08-08 NOTE — Transfer of Care (Signed)
Immediate Anesthesia Transfer of Care Note  Patient: Jennifer Frazier  Procedure(s) Performed: Procedure(s): REMOVAL PORT-A-CATH (Right)  Patient Location: PACU  Anesthesia Type:General  Level of Consciousness: awake and patient cooperative  Airway & Oxygen Therapy: Patient Spontanous Breathing and Patient connected to face mask oxygen  Post-op Assessment: Report given to RN and Post -op Vital signs reviewed and stable  Post vital signs: Reviewed and stable  Last Vitals:  Filed Vitals:   08/08/14 0855  BP:   Pulse: 83  Temp:   Resp: 23    Complications: No apparent anesthesia complications

## 2014-08-08 NOTE — Op Note (Signed)
Preop diagnosis: Indwelling port a catheter for chemotherapy right   Postop diagnosis: Same  Procedure: Removal of port a catheter right   Surgeon: [name] M.D.  Anesthesia: LMA  with local  EBL: Minimal  Specimen none  Drains: None  Indications for procedure: The patient presents for removal of port a catheter after completing chemotherapy. The patient no longer requires central venous access. Risks of bleeding, infection, catheter fragmentation, embolization, arrhythmias and damage to arteries, veins and nerves and possibly other mediastinal structures discussed. The patient agrees to proceed.  Description of procedure: The patient was seen in the holding area. Questions were answered. The patient agreed to proceed. The patient was taken to the operating room. The patient was placed supine. Anesthesia was initiated. The skin on the RIGHT  upper chest was prepped and draped in a sterile fashion. Timeout was done. The patient received preoperative antibiotics. Incision was made through the old port site and the hub of the Port-A-Cath was seen. The sutures were cut to release the port from the chest wall. The catheter was removed in its entirety without difficulty. The tract was closed with 3-0 Vicryl. 4 Monocryl was used to close the skin. All final counts were correct. Liquid adhesive applied.  The patient was taken to recovery in satisfactory condition.

## 2014-08-08 NOTE — Anesthesia Preprocedure Evaluation (Addendum)
Anesthesia Evaluation  Patient identified by MRN, date of birth, ID band Patient awake    Reviewed: Allergy & Precautions, NPO status , Patient's Chart, lab work & pertinent test results  Airway Mallampati: I  TM Distance: >3 FB Neck ROM: Full    Dental  (+) Teeth Intact, Dental Advisory Given   Pulmonary sleep apnea and Continuous Positive Airway Pressure Ventilation ,  breath sounds clear to auscultation        Cardiovascular hypertension, Pt. on medications Rhythm:Regular     Neuro/Psych PSYCHIATRIC DISORDERS Anxiety Depression    GI/Hepatic   Endo/Other  Hypothyroidism   Renal/GU      Musculoskeletal   Abdominal   Peds  Hematology  (+) anemia ,   Anesthesia Other Findings   Reproductive/Obstetrics                            Anesthesia Physical Anesthesia Plan  ASA: II  Anesthesia Plan: General   Post-op Pain Management:    Induction: Intravenous  Airway Management Planned: LMA  Additional Equipment:   Intra-op Plan:   Post-operative Plan: Extubation in OR  Informed Consent: I have reviewed the patients History and Physical, chart, labs and discussed the procedure including the risks, benefits and alternatives for the proposed anesthesia with the patient or authorized representative who has indicated his/her understanding and acceptance.   Dental advisory given  Plan Discussed with: CRNA, Anesthesiologist and Surgeon  Anesthesia Plan Comments:         Anesthesia Quick Evaluation

## 2014-08-08 NOTE — Discharge Instructions (Signed)
GENERAL SURGERY: POST OP INSTRUCTIONS ° °1. DIET: Follow a light bland diet the first 24 hours after arrival home, such as soup, liquids, crackers, etc.  Be sure to include lots of fluids daily.  Avoid fast food or heavy meals as your are more likely to get nauseated.   °2. Take your usually prescribed home medications unless otherwise directed. °3. PAIN CONTROL: °a. Pain is best controlled by a usual combination of three different methods TOGETHER: °i. Ice/Heat °ii. Over the counter pain medication °iii. Prescription pain medication °b. Most patients will experience some swelling and bruising around the incisions.  Ice packs or heating pads (30-60 minutes up to 6 times a day) will help. Use ice for the first few days to help decrease swelling and bruising, then switch to heat to help relax tight/sore spots and speed recovery.  Some people prefer to use ice alone, heat alone, alternating between ice & heat.  Experiment to what works for you.  Swelling and bruising can take several weeks to resolve.   °c. It is helpful to take an over-the-counter pain medication regularly for the first few weeks.  Choose one of the following that works best for you: °i. Naproxen (Aleve, etc)  Two 220mg tabs twice a day °ii. Ibuprofen (Advil, etc) Three 200mg tabs four times a day (every meal & bedtime) °iii. Acetaminophen (Tylenol, etc) 500-650mg four times a day (every meal & bedtime) °d. A  prescription for pain medication (such as oxycodone, hydrocodone, etc) should be given to you upon discharge.  Take your pain medication as prescribed.  °i. If you are having problems/concerns with the prescription medicine (does not control pain, nausea, vomiting, rash, itching, etc), please call us [phone] to see if we need to switch you to a different pain medicine that will work better for you and/or control your side effect better. °ii. If you need a refill on your pain medication, please contact your pharmacy.  They will contact our  office to request authorization. Prescriptions will not be filled after 5 pm or on week-ends. °4. Avoid getting constipated.  Between the surgery and the pain medications, it is common to experience some constipation.  Increasing fluid intake and taking a fiber supplement (such as Metamucil, Citrucel, FiberCon, MiraLax, etc) 1-2 times a day regularly will usually help prevent this problem from occurring.  A mild laxative (prune juice, Milk of Magnesia, MiraLax, etc) should be taken according to package directions if there are no bowel movements after 48 hours.   °5. Wash / shower every day.  You may shower over the dressings as they are waterproof.  Continue to shower over incision(s) after the dressing is off. °6. Remove your waterproof bandages 5 days after surgery.  You may leave the incision open to air.  You may have skin tapes (Steri Strips) covering the incision(s).  Leave them on until one week, then remove.  You may replace a dressing/Band-Aid to cover the incision for comfort if you wish.  ° ° ° ° °7. ACTIVITIES as tolerated:   °a. You may resume regular (light) daily activities beginning the next day--such as daily self-care, walking, climbing stairs--gradually increasing activities as tolerated.  If you can walk 30 minutes without difficulty, it is safe to try more intense activity such as jogging, treadmill, bicycling, low-impact aerobics, swimming, etc. °b. Save the most intensive and strenuous activity for last such as sit-ups, heavy lifting, contact sports, etc  Refrain from any heavy lifting or straining until you   are off narcotics for pain control.   °c. DO NOT PUSH THROUGH PAIN.  Let pain be your guide: If it hurts to do something, don't do it.  Pain is your body warning you to avoid that activity for another week until the pain goes down. °d. You may drive when you are no longer taking prescription pain medication, you can comfortably wear a seatbelt, and you can safely maneuver your car and  apply brakes. °e. You may have sexual intercourse when it is comfortable.  °8. FOLLOW UP in our office °a. Please call [facility] at [phone] to set up an appointment to see your surgeon in the office for a follow-up appointment approximately 2-3 weeks after your surgery. °b. Make sure that you call for this appointment the day you arrive home to insure a convenient appointment time. °9. IF YOU HAVE DISABILITY OR FAMILY LEAVE FORMS, BRING THEM TO THE OFFICE FOR PROCESSING.  DO NOT GIVE THEM TO YOUR DOCTOR. ° ° °WHEN TO CALL US [phone]: °1. Poor pain control °2. Reactions / problems with new medications (rash/itching, nausea, etc)  °3. Fever over 101.5 F (38.5 C) °4. Worsening swelling or bruising °5. Continued bleeding from incision. °6. Increased pain, redness, or drainage from the incision °7. Difficulty breathing / swallowing ° ° The clinic staff is available to answer your questions during regular business hours (8:30am-5pm).  Please don’t hesitate to call and ask to speak to one of our nurses for clinical concerns.  ° If you have a medical emergency, go to the nearest emergency room or call 911. ° A surgeon from [facility] is always on call at the hospitals ° ° °[facility] °[address], [address], [place], [place]  [address] ? °MAIN: [phone] ? TOLL FREE: [phone] ?  °FAX [phone] °[email or web address] ° °Post Anesthesia Home Care Instructions ° °Activity: °Get plenty of rest for the remainder of the day. A responsible adult should stay with you for 24 hours following the procedure.  °For the next 24 hours, DO NOT: °-Drive a car °-Operate machinery °-Drink alcoholic beverages °-Take any medication unless instructed by your physician °-Make any legal decisions or sign important papers. ° °Meals: °Start with liquid foods such as gelatin or soup. Progress to regular foods as tolerated. Avoid greasy, spicy, heavy foods. If nausea and/or  vomiting occur, drink only clear liquids until the nausea and/or vomiting subsides. Call your physician if vomiting continues. ° °Special Instructions/Symptoms: °Your throat may feel dry or sore from the anesthesia or the breathing tube placed in your throat during surgery. If this causes discomfort, gargle with warm salt water. The discomfort should disappear within 24 hours. ° °If you had a scopolamine patch placed behind your ear for the management of post- operative nausea and/or vomiting: ° °1. The medication in the patch is effective for 72 hours, after which it should be removed.  Wrap patch in a tissue and discard in the trash. Wash hands thoroughly with soap and water. °2. You may remove the patch earlier than 72 hours if you experience unpleasant side effects which may include dry mouth, dizziness or visual disturbances. °3. Avoid touching the patch. Wash your hands with soap and water after contact with the patch. °  ° °

## 2014-08-09 ENCOUNTER — Encounter (HOSPITAL_BASED_OUTPATIENT_CLINIC_OR_DEPARTMENT_OTHER): Payer: Self-pay | Admitting: Surgery

## 2014-08-09 NOTE — Telephone Encounter (Signed)
As noted below by Dr. Jana Hakim, I instructed the patient to increase Wellbutrin from 150 mg to 300 mg. Patient verbalized understanding of taking the medications and the follow up appointment in August. Also, she is to call the office if she has any problems before her August appointment. Patient verbalized understanding.

## 2014-08-16 ENCOUNTER — Telehealth: Payer: Self-pay | Admitting: *Deleted

## 2014-08-16 NOTE — Telephone Encounter (Signed)
This RN spoke with pt and discussed symptoms further and in greater detail.  Jennifer Frazier denies any visual or balance changes.  She does have bilateral weakness in hands she noticed since undergoing radiation and completion of chemotherapy.  Headache is not aggravated by movement including bending over.  Jennifer Frazier denies any sinus congestion or drainage.  Pain is more of " an ache vs shooting pain "- note Jennifer Frazier has had headaches with prior antidepressants with shooting pain.  Presently Jennifer Frazier is drinking 40-60 ounces of fluid a day of water.  Per discussion of above and MD review- plan is for pt to keep the Wellbutrin at 150 mg dose.  She is to use tylenol and ibuprofen as needed as well as Claritin or benadryl.  Jennifer Frazier verbalized understanding of above and to call if symptoms are not alleviated by above recommendations.

## 2014-08-16 NOTE — Telephone Encounter (Signed)
TC from patient this am. She states that she has increased her Wellbutrin as directed by Dr. Jana Hakim - increased from 150 mg to 300 mg as of 08/10/14. Starting Sunday she states she developed a 'headache-like' symptom on the left side of her head extending into her jaw and cheek-more like an ache. It has been consistent since Sunday and interfering with her sleep. Last night she only took 1 tablet with minimal to no change in symptoms over night and this morning. Of note-pt also had her right portacath removed last Wednesday without any problems. Denies fever, discomfort from that.

## 2014-08-21 ENCOUNTER — Other Ambulatory Visit: Payer: Self-pay | Admitting: Nurse Practitioner

## 2014-08-28 ENCOUNTER — Other Ambulatory Visit (HOSPITAL_BASED_OUTPATIENT_CLINIC_OR_DEPARTMENT_OTHER): Payer: 59

## 2014-08-28 DIAGNOSIS — Z803 Family history of malignant neoplasm of breast: Secondary | ICD-10-CM

## 2014-08-28 DIAGNOSIS — R5383 Other fatigue: Secondary | ICD-10-CM

## 2014-08-28 DIAGNOSIS — E032 Hypothyroidism due to medicaments and other exogenous substances: Secondary | ICD-10-CM

## 2014-08-28 DIAGNOSIS — D0510 Intraductal carcinoma in situ of unspecified breast: Secondary | ICD-10-CM

## 2014-08-28 DIAGNOSIS — Z800 Family history of malignant neoplasm of digestive organs: Secondary | ICD-10-CM

## 2014-08-28 DIAGNOSIS — C50411 Malignant neoplasm of upper-outer quadrant of right female breast: Secondary | ICD-10-CM | POA: Diagnosis not present

## 2014-08-28 LAB — CBC WITH DIFFERENTIAL/PLATELET
BASO%: 0.3 % (ref 0.0–2.0)
Basophils Absolute: 0 10*3/uL (ref 0.0–0.1)
EOS%: 1.5 % (ref 0.0–7.0)
Eosinophils Absolute: 0.1 10*3/uL (ref 0.0–0.5)
HEMATOCRIT: 51 % — AB (ref 34.8–46.6)
HEMOGLOBIN: 16.7 g/dL — AB (ref 11.6–15.9)
LYMPH%: 19.9 % (ref 14.0–49.7)
MCH: 27.4 pg (ref 25.1–34.0)
MCHC: 32.7 g/dL (ref 31.5–36.0)
MCV: 83.8 fL (ref 79.5–101.0)
MONO#: 0.2 10*3/uL (ref 0.1–0.9)
MONO%: 3.5 % (ref 0.0–14.0)
NEUT%: 74.8 % (ref 38.4–76.8)
NEUTROS ABS: 4.5 10*3/uL (ref 1.5–6.5)
Platelets: 193 10*3/uL (ref 145–400)
RBC: 6.08 10*6/uL — ABNORMAL HIGH (ref 3.70–5.45)
RDW: 14 % (ref 11.2–14.5)
WBC: 6 10*3/uL (ref 3.9–10.3)
lymph#: 1.2 10*3/uL (ref 0.9–3.3)

## 2014-08-28 LAB — COMPREHENSIVE METABOLIC PANEL (CC13)
ALT: 12 U/L (ref 0–55)
ANION GAP: 11 meq/L (ref 3–11)
AST: 17 U/L (ref 5–34)
Albumin: 4.4 g/dL (ref 3.5–5.0)
Alkaline Phosphatase: 104 U/L (ref 40–150)
BUN: 15.4 mg/dL (ref 7.0–26.0)
CHLORIDE: 104 meq/L (ref 98–109)
CO2: 27 mEq/L (ref 22–29)
Calcium: 10.7 mg/dL — ABNORMAL HIGH (ref 8.4–10.4)
Creatinine: 0.9 mg/dL (ref 0.6–1.1)
EGFR: 90 mL/min/{1.73_m2} — AB (ref >90)
GLUCOSE: 62 mg/dL — AB (ref 70–140)
Potassium: 3.4 mEq/L — ABNORMAL LOW (ref 3.5–5.1)
SODIUM: 142 meq/L (ref 136–145)
Total Bilirubin: 0.65 mg/dL (ref 0.20–1.20)
Total Protein: 8.3 g/dL (ref 6.4–8.3)

## 2014-08-29 LAB — TSH CHCC: TSH: 0.129 m[IU]/L — AB (ref 0.308–3.960)

## 2014-09-04 ENCOUNTER — Ambulatory Visit (HOSPITAL_BASED_OUTPATIENT_CLINIC_OR_DEPARTMENT_OTHER): Payer: 59 | Admitting: Nurse Practitioner

## 2014-09-04 ENCOUNTER — Encounter: Payer: Self-pay | Admitting: Nurse Practitioner

## 2014-09-04 VITALS — BP 116/79 | HR 68 | Temp 98.0°F | Resp 18 | Ht 69.0 in | Wt 250.0 lb

## 2014-09-04 DIAGNOSIS — Z79810 Long term (current) use of selective estrogen receptor modulators (SERMs): Secondary | ICD-10-CM | POA: Diagnosis not present

## 2014-09-04 DIAGNOSIS — Z170 Estrogen receptor positive status [ER+]: Secondary | ICD-10-CM | POA: Diagnosis not present

## 2014-09-04 DIAGNOSIS — E039 Hypothyroidism, unspecified: Secondary | ICD-10-CM

## 2014-09-04 DIAGNOSIS — C50411 Malignant neoplasm of upper-outer quadrant of right female breast: Secondary | ICD-10-CM | POA: Diagnosis not present

## 2014-09-04 DIAGNOSIS — R232 Flushing: Secondary | ICD-10-CM

## 2014-09-04 MED ORDER — GABAPENTIN 100 MG PO CAPS: 100.0000 mg | ORAL_CAPSULE | Freq: Every day | ORAL | Status: DC

## 2014-09-04 NOTE — Progress Notes (Signed)
Jennifer Frazier  Telephone:([phone]) [phone] Fax:(336) [phone]     ID: Jennifer Frazier DOB: 1975-10-14  MR#: [id number]  LPN#:[id number]  Patient Care Team: Sharilyn Sites, MD as PCP - General (Family Medicine) Chauncey Cruel, MD as Consulting Physician (Oncology) Thea Silversmith, MD as Consulting Physician (Radiation Oncology) [name], MD as Consulting Physician (General Surgery)   CHIEF COMPLAINT: Estrogen receptor positive stage I breast cancer  CURRENT TREATMENT: Tamoxifen   BREAST CANCER HISTORY: From the original intake note:  The patient had screening mammography at [place] showing calcifications in the right breast, and was referred to the breast Center 07/06/2013 for additional views. Right diagnostic mammography confirmed a group of amorphous calcifications in the upper outer right breast measuring 1.2 cm.  Biopsy of this area 07/17/2013 showed ([id number]) ductal carcinoma in situ, high-grade, estrogen receptor 100% positive, and progesterone receptor 96% positive, both with strong staining intensity.  The patient was then referred to surgery and on 07/25/2013 underwent bilateral breast MRI. This showed a breast density category CEA. In the right breast there was an area of clumped nodular enhancement measuring 3 cm. There was also a 1.5 cm circumscribed oval mass in the medial portion of the right breast consistent with a fibroadenoma. The left breast was unremarkable, and there were no abnormal appearing lymph nodes.  On 08/08/2013 the patient underwent right lumpectomy and sentinel lymph node sampling. The pathology ([id number] was (showed, in addition to ductal carcinoma in situ, invasive ductal carcinoma, measuring 0.7 cm, grade 3, estrogen receptor 86% positive, progesterone receptor 89% positive, both with strong staining intensity, with an MIB-1 of 62%, and no HER-2 amplification. Both sentinel lymph nodes were clear. Margins were ample.    The patient's subsequent history is as detailed below  INTERVAL HISTORY: Jennifer Frazier returns today for follow up of her breast cancer. At her last visit she was started on tamoxifen. Overall she is tolerating this well with infrequent hot flashes, but no vaginal dryness. She takes 314m gabapentin nightly, and this is helpful. She gets very warm however after hot showers, and she complains that her skin tingles.  REVIEW OF SYSTEMS: W[place]izeedenies fevers, chills, nausea, vomiting or changes in bowel or bladder habits. She is depressed. She does not like her current job and report she has applied to almost 445others. She is gaining weight, and is up 6lb since her last visit. She walks on the treadmill 30 minutes daily, 6 days a week. She denies pain. A detailed review of systems is otherwise stable.  PAST MEDICAL HISTORY: Past Medical History  Diagnosis Date  . Fibroids     uterine fibroids  . Anemia   . Thyroid disease   . Contact lens/glasses fitting     wears contacts or glasses  . Sleep apnea     has a cpap-does not always use it  . Cancer     Breast  . Radiation 04/04/14-05/21/14    Right upper-outer breast  . Hypertension   . Hypothyroidism   . Depression   . Anxiety     PAST SURGICAL HISTORY: Past Surgical History  Procedure Laterality Date  . [place]    urerine ablasion  . Eye surgery  2013    retinal tear-lazer-both  . Wisdom tooth extraction    . Breast surgery      lumpectomy 7/14  . Portacath placement Right 09/29/2013    Procedure: INSERTION PORT-A-CATH WITH ULTRA SOUND;  Surgeon: TErroll Luna MD;  Location: [facility];  Service: General;  Laterality: Right;  . Port a cath revision N/A 12/19/2013    Procedure: REPOSITION PORT;  Surgeon: [name], MD;  Location: [facility];  Service: General;  Laterality: N/A;  . Port-a-cath removal Right 08/08/2014    Procedure: REMOVAL PORT-A-CATH;  Surgeon: [name], MD;  Location: [facility];  Service: General;  Laterality: Right;    FAMILY HISTORY Family History  Problem Relation Age of Onset  . Cancer Father 24    colon  . Cancer Maternal Aunt 50    breast cancer  . Cancer Paternal Aunt 34    breast cancer  . Cancer Maternal Grandmother 52    ovarian or uterine cancer  . Cancer Paternal Grandmother     unknown primary   the patient's father died at the age of 21, been diagnosed with colon cancer at the age of 31. The patient's mother is living, currently 23 years old. The patient has one brother, no sisters. One of the patient's mother's sisters was diagnosed with breast cancer at the age of 35 in one of the patient's father's mother was diagnosed with breast cancer at the age of 38. There is no history of ovarian cancer in the family.  GYNECOLOGIC HISTORY:  No LMP recorded. Patient is not currently having periods (Reason: Chemotherapy). Menarche age 22, for the patient is GX P0. She was still having regular periods at the start of chemotherapy. She used oral contraceptives for approximately one year with no complications.  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 She is single and her mother lives with her.(The patient's mother is wheelchair-bound secondary to a stroke).    ADVANCED DIRECTIVES: Not in place; at the 08/24/2013 visit the patient was given the appropriate documents to come feel notarize associate may declare healthcare power of attorney.   HEALTH MAINTENANCE: History  Substance Use Topics  . Smoking status: Never Smoker   . Smokeless tobacco: Never Used  . Alcohol Use: No     Colonoscopy:  PAP:  Bone density:  Lipid panel:  Allergies  Allergen Reactions  . Gadolinium      Desc: NAUSEA WITH MRI CONTRAST-NO VOMITING   . Tramadol Hives and Nausea Only    Current Outpatient Prescriptions  Medication Sig Dispense Refill  . buPROPion (WELLBUTRIN XL) 150 MG 24 hr tablet Take  1 tablet (150 mg total) by mouth daily. Started on 04/28/14 30 tablet 3  . Cholecalciferol (VITAMIN D3) 3000 UNITS TABS Take 10,000 Units by mouth daily.    [name] gabapentin (NEURONTIN) 300 MG capsule Take 1 capsule (300 mg total) by mouth at bedtime. 30 capsule 2  . hydrochlorothiazide (HYDRODIURIL) 25 MG tablet Take 1 tablet (25 mg total) by mouth daily. 30 tablet 3  . ibuprofen (ADVIL,MOTRIN) 100 MG/5ML suspension Take 200 mg by mouth every 4 (four) hours as needed.    [name] levothyroxine (SYNTHROID, LEVOTHROID) 88 MCG tablet Take 88 mcg by mouth daily.  0  . tamoxifen (NOLVADEX) 20 MG tablet Take 20 mg by mouth daily.    . vitamin B-12 (CYANOCOBALAMIN) 1000 MCG tablet Take 5,000 mcg by mouth daily.    . busPIRone (BUSPAR) 10 MG tablet   0  . diphenhydramine-acetaminophen (TYLENOL PM) 25-500 MG TABS Take 1 tablet by mouth at bedtime as needed (pain/headache).    . gabapentin (NEURONTIN) 100 MG capsule Take 1 capsule (100 mg total) by mouth daily. 30 capsule  5  . ibuprofen (ADVIL) 200 MG tablet Take 3 tablets (600 mg total) by mouth every 8 (eight) hours as needed for moderate pain. (Patient not taking: Reported on 09/04/2014) 30 tablet 0  . ibuprofen (ADVIL,MOTRIN) 600 MG tablet take 1 tablet by mouth every 8 hours AS NEEDED FOR MODERATE PAIN  0   No current facility-administered medications for this visit.    OBJECTIVE: Middle-aged [place] American woman who appears well Filed Vitals:   09/04/14 1524  BP: 116/79  Pulse: 68  Temp: 98 F (36.7 C)  Resp: 18     Body mass index is 36.9 kg/(m^2).    ECOG FS:0 - Asymptomatic  Skin: warm, dry  HEENT: sclerae anicteric, conjunctivae pink, oropharynx clear. No thrush or mucositis.  Lymph Nodes: No cervical or supraclavicular lymphadenopathy  Lungs: clear to auscultation bilaterally, no rales, wheezes, or rhonci  Heart: regular rate and rhythm  Abdomen: round, soft, non tender, positive bowel sounds  Musculoskeletal: No focal spinal tenderness, no  peripheral edema  Neuro: non focal, well oriented, positive affect  Breasts: right breast status post lumpectomy and radiation. Residual hyperpigmentation. No evidence of recurrent disease. Right axilla benign. Left breast unremarkable.  LAB RESULTS:  CMP     Component Value Date/Time   NA 142 08/28/2014 1621   NA 139 08/07/2014 1325   K 3.4* 08/28/2014 1621   K 4.4 08/07/2014 1325   CL 100* 08/07/2014 1325   CO2 27 08/28/2014 1621   CO2 30 08/07/2014 1325   GLUCOSE 62* 08/28/2014 1621   GLUCOSE 77 08/07/2014 1325   BUN 15.4 08/28/2014 1621   BUN 16 08/07/2014 1325   CREATININE 0.9 08/28/2014 1621   CREATININE 0.92 08/07/2014 1325   CALCIUM 10.7* 08/28/2014 1621   CALCIUM 10.1 08/07/2014 1325   PROT 8.3 08/28/2014 1621   PROT 7.7 08/07/2014 1325   ALBUMIN 4.4 08/28/2014 1621   ALBUMIN 4.1 08/07/2014 1325   AST 17 08/28/2014 1621   AST 19 08/07/2014 1325   ALT 12 08/28/2014 1621   ALT 14 08/07/2014 1325   ALKPHOS 104 08/28/2014 1621   ALKPHOS 96 08/07/2014 1325   BILITOT 0.65 08/28/2014 1621   BILITOT 0.6 08/07/2014 1325   GFRNONAA >60 08/07/2014 1325   GFRAA >60 08/07/2014 1325    I No results found for: SPEP  Lab Results  Component Value Date   WBC 6.0 08/28/2014   NEUTROABS 4.5 08/28/2014   HGB 16.7* 08/28/2014   HCT 51.0* 08/28/2014   MCV 83.8 08/28/2014   PLT 193 08/28/2014      Chemistry      Component Value Date/Time   NA 142 08/28/2014 1621   NA 139 08/07/2014 1325   K 3.4* 08/28/2014 1621   K 4.4 08/07/2014 1325   CL 100* 08/07/2014 1325   CO2 27 08/28/2014 1621   CO2 30 08/07/2014 1325   BUN 15.4 08/28/2014 1621   BUN 16 08/07/2014 1325   CREATININE 0.9 08/28/2014 1621   CREATININE 0.92 08/07/2014 1325      Component Value Date/Time   CALCIUM 10.7* 08/28/2014 1621   CALCIUM 10.1 08/07/2014 1325   ALKPHOS 104 08/28/2014 1621   ALKPHOS 96 08/07/2014 1325   AST 17 08/28/2014 1621   AST 19 08/07/2014 1325   ALT 12 08/28/2014 1621   ALT  14 08/07/2014 1325   BILITOT 0.65 08/28/2014 1621   BILITOT 0.6 08/07/2014 1325       No results found for: LABCA2  No components found  for: HMCNO709  No results for input(s): INR in the last 168 hours.  Urinalysis    Component Value Date/Time   COLORURINE YELLOW 02/02/2014 0845   APPEARANCEUR CLEAR 02/02/2014 0845   LABSPEC 1.009 02/02/2014 0845   PHURINE 6.0 02/02/2014 0845   GLUCOSEU NEGATIVE 02/02/2014 0845   HGBUR NEGATIVE 02/02/2014 0845   BILIRUBINUR NEGATIVE 02/02/2014 0845   KETONESUR NEGATIVE 02/02/2014 0845   PROTEINUR NEGATIVE 02/02/2014 0845   UROBILINOGEN 0.2 02/02/2014 0845   NITRITE NEGATIVE 02/02/2014 0845   LEUKOCYTESUR TRACE* 02/02/2014 0845    STUDIES: No results found.   ASSESSMENT: 39 y.o. BRCA negative [place] woman status post right breast biopsy 07/17/2013 for high-grade ductal carcinoma in situ, estrogen and progesterone receptor positive  (1) status post right lumpectomy and sentinel lymph node sampling 08/08/2013 for a pT1b pN0, stage IA invasive ductal carcinoma, grade 3, estrogen and progesterone receptor positive, with an MIB-1 of 62% and no HER-2 amplification  (2) genetics sent 08/02/2013 was normal but did identify a variant of uncertain significance called BRCA2, p.[id number].  (3) Oncotype score of 23 predicts a risk of outside the breast recurrence within 10 years of 15% if the patient's only systemic treatment is tamoxifen for 5 years. Adjuvant chemotherapy was recommended  (4) doxorubicin and cyclophosphamide in dose dense fashion x4  Completed 11/16/2013, followed by paclitaxel weekly x12  Completed 03/08/2014  (5) adjuvant radiation 3/9/206-05/21/2014:  Right breast / 45 Gray @ 1.8 Pearline Cables per fraction x 25 fractions Right breast boost / 16 Gray at [employer] per fraction x 8 fractions  (6) tamoxifen started 06/20/2014   PLAN: Jennifer Frazier has been stable since her last visit. She is tolerating the tamoxifen relatively well and will  continue this for at least 2 or 3 years before considering switching an aromatase inhibitor. She will continue taking the 343m gabapentin QHS, but she showed a mild interest in taking maybe 1036mduring the day as well. This was sent into the pharmacy during our visit.   As far as her weight loss is concerned, I'm not convinced she is challenging herself enough on the treadmill. Though she is walking 6 days a week, she could stand to increase the incline or the speed to get her heart rate up. I offered her a pamphlet on the livestrong program, but she says she already has one and just hasn't been motivated to sign up. She has been eating relatively healthy, but also has not been consciously dieting.  A TSH level has been drawn and is low. She states that this is the 2nd or 3rd time its been low, but her PCP has her on the same dose of synthroid. I asked her to return to Dr. GoHilma Favorsbut she stated she would prefer to see an endocrinologist, so I am working on a referral to someone now.  WeVeidaill have a repeat mammogram on 9/1, then meet with Dr. MaJana Hakimater in September. She understands and agrees with this plan. She knows the goal of treatment in her case is cure. She has been encouraged to call with any issues that might arise before her next visit here.     HeLaurie PandaNP   09/04/2014 4:09 PM

## 2014-09-05 ENCOUNTER — Other Ambulatory Visit: Payer: Self-pay | Admitting: Nurse Practitioner

## 2014-09-05 DIAGNOSIS — E039 Hypothyroidism, unspecified: Secondary | ICD-10-CM

## 2014-09-06 ENCOUNTER — Telehealth: Payer: Self-pay | Admitting: Oncology

## 2014-09-06 NOTE — Telephone Encounter (Signed)
Faxed pt medical records to Dr. Elyse Hsu [phone]

## 2014-09-27 ENCOUNTER — Ambulatory Visit
Admission: RE | Admit: 2014-09-27 | Discharge: 2014-09-27 | Disposition: A | Discharge status: Home / Self Care | Payer: 59 | Source: Ambulatory Visit | Attending: Obstetrics and Gynecology | Admitting: Obstetrics and Gynecology

## 2014-09-27 DIAGNOSIS — Z853 Personal history of malignant neoplasm of breast: Secondary | ICD-10-CM

## 2014-10-09 ENCOUNTER — Other Ambulatory Visit: Payer: Self-pay | Admitting: *Deleted

## 2014-10-09 DIAGNOSIS — C50411 Malignant neoplasm of upper-outer quadrant of right female breast: Secondary | ICD-10-CM

## 2014-10-10 ENCOUNTER — Other Ambulatory Visit (HOSPITAL_BASED_OUTPATIENT_CLINIC_OR_DEPARTMENT_OTHER): Payer: 59

## 2014-10-10 DIAGNOSIS — C50411 Malignant neoplasm of upper-outer quadrant of right female breast: Secondary | ICD-10-CM

## 2014-10-10 LAB — COMPREHENSIVE METABOLIC PANEL (CC13)
ALBUMIN: 4 g/dL (ref 3.5–5.0)
ALK PHOS: 85 U/L (ref 40–150)
ALT: 10 U/L (ref 0–55)
ANION GAP: 8 meq/L (ref 3–11)
AST: 17 U/L (ref 5–34)
BILIRUBIN TOTAL: 0.47 mg/dL (ref 0.20–1.20)
BUN: 16.9 mg/dL (ref 7.0–26.0)
CO2: 29 mEq/L (ref 22–29)
Calcium: 10.4 mg/dL (ref 8.4–10.4)
Chloride: 103 mEq/L (ref 98–109)
Creatinine: 1.1 mg/dL (ref 0.6–1.1)
EGFR: 71 mL/min/{1.73_m2} — AB (ref >90)
Glucose: 86 mg/dl (ref 70–140)
Potassium: 3.9 mEq/L (ref 3.5–5.1)
Sodium: 140 mEq/L (ref 136–145)
TOTAL PROTEIN: 7.8 g/dL (ref 6.4–8.3)

## 2014-10-10 LAB — CBC WITH DIFFERENTIAL/PLATELET
BASO%: 0.2 % (ref 0.0–2.0)
Basophils Absolute: 0 10*3/uL (ref 0.0–0.1)
EOS%: 2.1 % (ref 0.0–7.0)
Eosinophils Absolute: 0.1 10*3/uL (ref 0.0–0.5)
HEMATOCRIT: 45.5 % (ref 34.8–46.6)
HEMOGLOBIN: 15.5 g/dL (ref 11.6–15.9)
LYMPH#: 1.2 10*3/uL (ref 0.9–3.3)
LYMPH%: 25.4 % (ref 14.0–49.7)
MCH: 28.5 pg (ref 25.1–34.0)
MCHC: 34.1 g/dL (ref 31.5–36.0)
MCV: 83.8 fL (ref 79.5–101.0)
MONO#: 0.3 10*3/uL (ref 0.1–0.9)
MONO%: 5.7 % (ref 0.0–14.0)
NEUT%: 66.6 % (ref 38.4–76.8)
NEUTROS ABS: 3.2 10*3/uL (ref 1.5–6.5)
PLATELETS: 159 10*3/uL (ref 145–400)
RBC: 5.43 10*6/uL (ref 3.70–5.45)
RDW: 13.8 % (ref 11.2–14.5)
WBC: 4.8 10*3/uL (ref 3.9–10.3)

## 2014-10-17 ENCOUNTER — Ambulatory Visit (HOSPITAL_BASED_OUTPATIENT_CLINIC_OR_DEPARTMENT_OTHER): Payer: 59 | Admitting: Oncology

## 2014-10-17 ENCOUNTER — Telehealth: Payer: Self-pay | Admitting: Oncology

## 2014-10-17 VITALS — BP 117/88 | HR 68 | Temp 98.1°F | Resp 18 | Ht 69.0 in | Wt 254.4 lb

## 2014-10-17 DIAGNOSIS — C50411 Malignant neoplasm of upper-outer quadrant of right female breast: Secondary | ICD-10-CM

## 2014-10-17 MED ORDER — VENLAFAXINE HCL ER 37.5 MG PO CP24: 37.5000 mg | ORAL_CAPSULE | Freq: Every day | ORAL | Status: DC

## 2014-10-17 NOTE — Progress Notes (Signed)
[place]  [phone]) [phone] Fax:([phone]) [phone]     ID: Jennifer Frazier DOB: 1975-06-20  MR#: [id number]  AJG#:[id number]  Patient Care Team: Sharilyn Sites, MD as PCP - General (Family Medicine) Chauncey Cruel, MD as Consulting Physician (Oncology) Thea Silversmith, MD as Consulting Physician (Radiation Oncology) [name], MD as Consulting Physician (General Surgery) OTHER MD: Legrand Como Altheimer MD  CHIEF COMPLAINT: Estrogen receptor positive breast cancer  CURRENT TREATMENT: Tamoxifen   BREAST CANCER HISTORY: From the original intake note:  The patient had screening mammography at [facility] showing calcifications in the right breast, and was referred to the breast Center 07/06/2013 for additional views. Right diagnostic mammography confirmed a group of amorphous calcifications in the upper outer right breast measuring 1.2 cm.  Biopsy of this area 07/17/2013 showed ([id number]) ductal carcinoma in situ, high-grade, estrogen receptor 100% positive, and progesterone receptor 96% positive, both with strong staining intensity.  The patient was then referred to surgery and on 07/25/2013 underwent bilateral breast MRI. This showed a breast density category CEA. In the right breast there was an area of clumped nodular enhancement measuring 3 cm. There was also a 1.5 cm circumscribed oval mass in the medial portion of the right breast consistent with a fibroadenoma. The left breast was unremarkable, and there were no abnormal appearing lymph nodes.  On 08/08/2013 the patient underwent right lumpectomy and sentinel lymph node sampling. The pathology ([id number] was (showed, in addition to ductal carcinoma in situ, invasive ductal carcinoma, measuring 0.7 cm, grade 3, estrogen receptor 86% positive, progesterone receptor 89% positive, both with strong staining intensity, with an MIB-1 of 62%, and no HER-2 amplification. Both sentinel lymph nodes were clear.  Margins were ample.   The patient's subsequent history is as detailed below  INTERVAL HISTORY: Jennifer Frazier returns today for follow up of her breast canceraccompanied by her mother. Jennifer Frazier continues on tamoxifen, which she is tolerating generally well. She does have hot flashes and these can wake her up at night. She tried Neurontin but it isn't working very well for her. She is losing a little bit of hair which she thinks may be related to the tamoxifen. She is not having vaginal wetness issues. She obtains a drug at a good cost.  REVIEW OF SYSTEMS: Jennifer Frazier Has problems with insomnia. She gets up about 9:30 in the morning (when she was working she would get up around 6 AM). She goes to bed close to midnight. She does not take daytime naps. She has discomfort in the lateral aspect of the surgical breast, which is more a sensitivity to touch or pressure than anything else. She has low back pain. She has rare headaches, which are not more persistent or intense than before. She feels anxious but not depressed. Overall she "feels fine". She is not exercising regularly.A detailed review of systems was otherwise stable.  PAST MEDICAL HISTORY: Past Medical History  Diagnosis Date  . Fibroids     uterine fibroids  . Anemia   . Thyroid disease   . Contact lens/glasses fitting     wears contacts or glasses  . Sleep apnea     has a cpap-does not always use it  . Cancer     Breast  . Radiation 04/04/14-05/21/14    Right upper-outer breast  . Hypertension   . Hypothyroidism   . Depression   . Anxiety     PAST SURGICAL HISTORY: Past Surgical History  Procedure Laterality Date  . [place]  2012    urerine ablasion  . Eye surgery  2013    retinal tear-lazer-both  . Wisdom tooth extraction    . Breast surgery      lumpectomy 7/14  . Portacath placement Right 09/29/2013    Procedure: INSERTION PORT-A-CATH WITH ULTRA SOUND;  Surgeon: [name], MD;  Location: [place] [facility];  Service: General;   Laterality: Right;  . Port a cath revision N/A 12/19/2013    Procedure: REPOSITION PORT;  Surgeon: [name], MD;  Location: [place];  Service: General;  Laterality: N/A;  . Port-a-cath removal Right 08/08/2014    Procedure: REMOVAL PORT-A-CATH;  Surgeon: [name], MD;  Location: [place];  Service: General;  Laterality: Right;    FAMILY HISTORY Family History  Problem Relation Age of Onset  . Cancer Father 60    colon  . Cancer Maternal Aunt 50    breast cancer  . Cancer Paternal Aunt 22    breast cancer  . Cancer Maternal Grandmother 32    ovarian or uterine cancer  . Cancer Paternal Grandmother     unknown primary   the patient's father died at the age of 33, been diagnosed with colon cancer at the age of 61. The patient's mother is living, currently 72 years old. The patient has one brother, no sisters. One of the patient's mother's sisters was diagnosed with breast cancer at the age of 71 in one of the patient's father's mother was diagnosed with breast cancer at the age of 22. There is no history of ovarian cancer in the family.  GYNECOLOGIC HISTORY:  No LMP recorded. Patient is not currently having periods (Reason: Chemotherapy). Menarche age 31, for the patient is GX P0. She was still having regular periods at the start of chemotherapy. She used oral contraceptives for approximately one year with no complications.  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 She is single and her mother lives with her.(The patient's mother is wheelchair-bound secondary to a stroke).    ADVANCED DIRECTIVES: Not in place; at the 08/24/2013 visit the patient was given the appropriate documents to come feel notarize associate may declare healthcare power of attorney.   HEALTH MAINTENANCE: Social History    Substance Use Topics  . Smoking status: Never Smoker   . Smokeless tobacco: Never Used  . Alcohol Use: No     Colonoscopy:  PAP:  Bone density:  Lipid panel:  Allergies  Allergen Reactions  . Gadolinium      Desc: NAUSEA WITH MRI CONTRAST-NO VOMITING   . Tramadol Hives and Nausea Only    Current Outpatient Prescriptions  Medication Sig Dispense Refill  . buPROPion (WELLBUTRIN XL) 150 MG 24 hr tablet Take 1 tablet (150 mg total) by mouth daily. Started on 04/28/14 30 tablet 3  . busPIRone (BUSPAR) 10 MG tablet   0  . Cholecalciferol (VITAMIN D3) 3000 UNITS TABS Take 10,000 Units by mouth daily.    . diphenhydramine-acetaminophen (TYLENOL PM) 25-500 MG TABS Take 1 tablet by mouth at bedtime as needed (pain/headache).    . gabapentin (NEURONTIN) 100 MG capsule Take 1 capsule (100 mg total) by mouth daily. 30 capsule 5  . gabapentin (NEURONTIN) 300 MG capsule Take 1 capsule (300 mg total) by mouth at bedtime. 30 capsule 2  . hydrochlorothiazide (HYDRODIURIL) 25 MG tablet Take 1  tablet (25 mg total) by mouth daily. 30 tablet 3  . ibuprofen (ADVIL,MOTRIN) 600 MG tablet take 1 tablet by mouth every 8 hours AS NEEDED FOR MODERATE PAIN  0  . levothyroxine (SYNTHROID, LEVOTHROID) 88 MCG tablet Take 88 mcg by mouth daily.  0  . tamoxifen (NOLVADEX) 20 MG tablet Take 20 mg by mouth daily.    . vitamin B-12 (CYANOCOBALAMIN) 1000 MCG tablet Take 5,000 mcg by mouth daily.     No current facility-administered medications for this visit.    OBJECTIVE: Middle-aged [place] American woman who appears stated age  14 Vitals:   10/17/14 1106  BP: 117/88  Pulse: 68  Temp: 98.1 F (36.7 C)  Resp: 18     Body mass index is 37.55 kg/(m^2).    ECOG FS:1 - Symptomatic but completely ambulatory  Sclerae unicteric, pupils round and equal Oropharynx clear and moist-- no thrush or other lesions No cervical or supraclavicular adenopathy Lungs no rales or rhonchi Heart regular rate and rhythm Abd  soft, obese,nontender, positive bowel sounds MSK no focal spinal tenderness, no upper extremity lymphedema Neuro: nonfocal, well oriented, appropriate affect Breasts: the right breast is status post lumpectomy and radiation. There is still some hyperpigmentation, but I do not palpate any suspicious masses. There are no other skin or nipple changes of concern. The right axilla is benign per the left breast is unremarkable.  LAB RESULTS:  CMP     Component Value Date/Time   NA 140 10/10/2014 1613   NA 139 08/07/2014 1325   K 3.9 10/10/2014 1613   K 4.4 08/07/2014 1325   CL 100* 08/07/2014 1325   CO2 29 10/10/2014 1613   CO2 30 08/07/2014 1325   GLUCOSE 86 10/10/2014 1613   GLUCOSE 77 08/07/2014 1325   BUN 16.9 10/10/2014 1613   BUN 16 08/07/2014 1325   CREATININE 1.1 10/10/2014 1613   CREATININE 0.92 08/07/2014 1325   CALCIUM 10.4 10/10/2014 1613   CALCIUM 10.1 08/07/2014 1325   PROT 7.8 10/10/2014 1613   PROT 7.7 08/07/2014 1325   ALBUMIN 4.0 10/10/2014 1613   ALBUMIN 4.1 08/07/2014 1325   AST 17 10/10/2014 1613   AST 19 08/07/2014 1325   ALT 10 10/10/2014 1613   ALT 14 08/07/2014 1325   ALKPHOS 85 10/10/2014 1613   ALKPHOS 96 08/07/2014 1325   BILITOT 0.47 10/10/2014 1613   BILITOT 0.6 08/07/2014 1325   GFRNONAA >60 08/07/2014 1325   GFRAA >60 08/07/2014 1325    I No results found for: SPEP  Lab Results  Component Value Date   WBC 4.8 10/10/2014   NEUTROABS 3.2 10/10/2014   HGB 15.5 10/10/2014   HCT 45.5 10/10/2014   MCV 83.8 10/10/2014   PLT 159 10/10/2014      Chemistry      Component Value Date/Time   NA 140 10/10/2014 1613   NA 139 08/07/2014 1325   K 3.9 10/10/2014 1613   K 4.4 08/07/2014 1325   CL 100* 08/07/2014 1325   CO2 29 10/10/2014 1613   CO2 30 08/07/2014 1325   BUN 16.9 10/10/2014 1613   BUN 16 08/07/2014 1325   CREATININE 1.1 10/10/2014 1613   CREATININE 0.92 08/07/2014 1325      Component Value Date/Time   CALCIUM 10.4 10/10/2014  1613   CALCIUM 10.1 08/07/2014 1325   ALKPHOS 85 10/10/2014 1613   ALKPHOS 96 08/07/2014 1325   AST 17 10/10/2014 1613   AST 19 08/07/2014 1325   ALT 10  10/10/2014 1613   ALT 14 08/07/2014 1325   BILITOT 0.47 10/10/2014 1613   BILITOT 0.6 08/07/2014 1325       No results found for: LABCA2  No components found for: LABCA125  No results for input(s): INR in the last 168 hours.  Urinalysis    Component Value Date/Time   COLORURINE YELLOW 02/02/2014 0845   APPEARANCEUR CLEAR 02/02/2014 0845   LABSPEC 1.009 02/02/2014 0845   PHURINE 6.0 02/02/2014 0845   GLUCOSEU NEGATIVE 02/02/2014 0845   HGBUR NEGATIVE 02/02/2014 0845   BILIRUBINUR NEGATIVE 02/02/2014 0845   KETONESUR NEGATIVE 02/02/2014 0845   PROTEINUR NEGATIVE 02/02/2014 0845   UROBILINOGEN 0.2 02/02/2014 0845   NITRITE NEGATIVE 02/02/2014 0845   LEUKOCYTESUR TRACE* 02/02/2014 0845    STUDIES: Mm Diag Breast Tomo Bilateral  09/27/2014   CLINICAL DATA:  History of treated right breast cancer, status post lumpectomy radiation and chemotherapy in 2015. Baseline postsurgical mammogram.  EXAM: DIGITAL DIAGNOSTIC BILATERAL MAMMOGRAM WITH 3D TOMOSYNTHESIS AND CAD  COMPARISON:  Previous exam(s).  ACR Breast Density Category c: The breast tissue is heterogeneously dense, which may obscure small masses.  FINDINGS: There are no suspicious masses, areas of nonsurgical architectural distortion or microcalcifications in either breast. Expected postsurgical changes is seen in the right breast upper outer quadrant, posterior depth. Post radiation changes in the right breast also noted.  Mammographic images were processed with CAD.  IMPRESSION: No mammographic evidence of malignancy status post right lumpectomy and radiation therapy.  RECOMMENDATION: Diagnostic mammogram is suggested in 1 year. (Code:DM-B-01Y)  I have discussed the findings and recommendations with the patient. Results were also provided in writing at the conclusion of the  visit. If applicable, a reminder letter will be sent to the patient regarding the next appointment.  BI-RADS CATEGORY  2: Benign.   Electronically Signed   By: Fidela Salisbury M.D.   On: 09/27/2014 16:13     ASSESSMENT: 39 y.o. BRCA negative [place] woman status post right breast biopsy 07/17/2013 for high-grade ductal carcinoma in situ, estrogen and progesterone receptor positive  (1) status post right lumpectomy and sentinel lymph node sampling 08/08/2013 for a pT1b pN0, stage IA invasive ductal carcinoma, grade 3, estrogen and progesterone receptor positive, with an MIB-1 of 62% and no HER-2 amplification  (2) genetics sent 08/02/2013 was normal but did identify a variant of uncertain significance called BRCA2, [id number].  (3) Oncotype score of 23 predicts a risk of outside the breast recurrence within 10 years of 15% if the patient's only systemic treatment is tamoxifen for 5 years. Adjuvant chemotherapy was recommended  (4) doxorubicin and cyclophosphamide in dose dense fashion x4 completed 11/16/2013, followed by paclitaxel weekly x12 completed 03/08/2014  (5) adjuvant radiation 3/9/206-05/21/2014:  Right breast / 45 Gray @ 1.8 Pearline Cables per fraction x 25 fractions Right breast boost / 16 Gray at [employer] per fraction x 8 fractions  (6) tamoxifen started 06/20/2014   PLAN: Jennifer Frazier continues on tamoxifen with generally good tolerance. The worse problem she is having his nighttime hot flashes. She also has insomnia issues.  I think if we increase the nighttime gabapentin to 600 mg nightly she will have better success. She will give this a try let us know whether it works for her or not.  I reassured her that the discomfort in the surgical breast that she is experiencing is very normal. It may be long lasting. Sometimes the symptoms resolve and then recur. She should not think that this is related to breast  cancer per se, but is rather a consequence of the breast cancer treatment that she  received.  I again encouraged her to start a vigorous exercise program. If she wants to make headway with weight control, she would need to do 45 minutes to 60 minutes 5 days a week. Since she is not working right now this is a very good time to get started. Once she starts her job she will have less time to take care of her general health.  We're going to see her again in 3 months from now and we will continue to see her every 3 months through mid-2017at which time we will start every 6 month visits.  Note that her periods have not resumed. At the 2 year and 5 year mark of tamoxifen she will have the option of switching to an aromatase inhibitor.   Chauncey Cruel, MD   10/17/2014 11:28 AM

## 2014-10-17 NOTE — Telephone Encounter (Signed)
Appointments made and avs printed for patient °

## 2014-10-19 MED ORDER — TAMOXIFEN CITRATE 20 MG PO TABS: 20.0000 mg | ORAL_TABLET | Freq: Every day | ORAL | Status: DC

## 2014-11-19 ENCOUNTER — Other Ambulatory Visit: Payer: Self-pay | Admitting: Nurse Practitioner

## 2014-11-19 ENCOUNTER — Other Ambulatory Visit: Payer: Self-pay | Admitting: *Deleted

## 2014-11-19 MED ORDER — HYDROCHLOROTHIAZIDE 25 MG PO TABS: 25.0000 mg | ORAL_TABLET | Freq: Every day | ORAL | Status: DC

## 2014-11-19 NOTE — Telephone Encounter (Signed)
Called pt and left message that her Gabapentin has been refilled and she can pickup at her pharmacy.Message fwd to Gentry Fitz, NP.

## 2014-11-30 ENCOUNTER — Encounter: Payer: Self-pay | Admitting: Genetic Counselor

## 2014-11-30 DIAGNOSIS — Z1379 Encounter for other screening for genetic and chromosomal anomalies: Secondary | ICD-10-CM | POA: Insufficient documentation

## 2014-11-30 DIAGNOSIS — Z0000 Encounter for general adult medical examination without abnormal findings: Secondary | ICD-10-CM | POA: Insufficient documentation

## 2014-12-19 ENCOUNTER — Other Ambulatory Visit: Payer: Self-pay | Admitting: *Deleted

## 2014-12-21 ENCOUNTER — Other Ambulatory Visit: Payer: Self-pay | Admitting: *Deleted

## 2014-12-21 DIAGNOSIS — R232 Flushing: Secondary | ICD-10-CM

## 2014-12-21 DIAGNOSIS — F32A Depression, unspecified: Secondary | ICD-10-CM

## 2014-12-21 DIAGNOSIS — C50411 Malignant neoplasm of upper-outer quadrant of right female breast: Secondary | ICD-10-CM

## 2014-12-21 DIAGNOSIS — F329 Major depressive disorder, single episode, unspecified: Secondary | ICD-10-CM

## 2014-12-21 MED ORDER — BUPROPION HCL ER (XL) 150 MG PO TB24: 150.0000 mg | ORAL_TABLET | Freq: Every day | ORAL | Status: DC

## 2015-01-09 ENCOUNTER — Other Ambulatory Visit: Payer: Self-pay | Admitting: *Deleted

## 2015-01-09 DIAGNOSIS — C50411 Malignant neoplasm of upper-outer quadrant of right female breast: Secondary | ICD-10-CM

## 2015-01-10 ENCOUNTER — Other Ambulatory Visit (HOSPITAL_BASED_OUTPATIENT_CLINIC_OR_DEPARTMENT_OTHER): Payer: 59

## 2015-01-10 ENCOUNTER — Ambulatory Visit (HOSPITAL_BASED_OUTPATIENT_CLINIC_OR_DEPARTMENT_OTHER): Payer: 59 | Admitting: Nurse Practitioner

## 2015-01-10 ENCOUNTER — Telehealth: Payer: Self-pay | Admitting: Oncology

## 2015-01-10 ENCOUNTER — Encounter: Payer: Self-pay | Admitting: Nurse Practitioner

## 2015-01-10 VITALS — BP 139/88 | HR 80 | Temp 97.8°F | Resp 16 | Wt 261.8 lb

## 2015-01-10 DIAGNOSIS — C50411 Malignant neoplasm of upper-outer quadrant of right female breast: Secondary | ICD-10-CM

## 2015-01-10 DIAGNOSIS — Z170 Estrogen receptor positive status [ER+]: Secondary | ICD-10-CM

## 2015-01-10 DIAGNOSIS — Z79810 Long term (current) use of selective estrogen receptor modulators (SERMs): Secondary | ICD-10-CM

## 2015-01-10 DIAGNOSIS — R635 Abnormal weight gain: Secondary | ICD-10-CM

## 2015-01-10 LAB — COMPREHENSIVE METABOLIC PANEL
ALT: 12 U/L (ref 0–55)
ANION GAP: 8 meq/L (ref 3–11)
AST: 18 U/L (ref 5–34)
Albumin: 3.8 g/dL (ref 3.5–5.0)
Alkaline Phosphatase: 77 U/L (ref 40–150)
BILIRUBIN TOTAL: 0.48 mg/dL (ref 0.20–1.20)
BUN: 15.1 mg/dL (ref 7.0–26.0)
CALCIUM: 10.1 mg/dL (ref 8.4–10.4)
CHLORIDE: 102 meq/L (ref 98–109)
CO2: 30 mEq/L — ABNORMAL HIGH (ref 22–29)
CREATININE: 1.1 mg/dL (ref 0.6–1.1)
EGFR: 74 mL/min/{1.73_m2} — ABNORMAL LOW (ref >90)
Glucose: 87 mg/dl (ref 70–140)
Potassium: 3.5 mEq/L (ref 3.5–5.1)
Sodium: 140 mEq/L (ref 136–145)
TOTAL PROTEIN: 7.9 g/dL (ref 6.4–8.3)

## 2015-01-10 LAB — CBC WITH DIFFERENTIAL/PLATELET
BASO%: 0.2 % (ref 0.0–2.0)
Basophils Absolute: 0 10*3/uL (ref 0.0–0.1)
EOS ABS: 0.1 10*3/uL (ref 0.0–0.5)
EOS%: 1.1 % (ref 0.0–7.0)
HEMATOCRIT: 43.7 % (ref 34.8–46.6)
HGB: 14.6 g/dL (ref 11.6–15.9)
LYMPH#: 1.4 10*3/uL (ref 0.9–3.3)
LYMPH%: 30.5 % (ref 14.0–49.7)
MCH: 28.5 pg (ref 25.1–34.0)
MCHC: 33.4 g/dL (ref 31.5–36.0)
MCV: 85.2 fL (ref 79.5–101.0)
MONO#: 0.2 10*3/uL (ref 0.1–0.9)
MONO%: 4.4 % (ref 0.0–14.0)
NEUT#: 2.9 10*3/uL (ref 1.5–6.5)
NEUT%: 63.8 % (ref 38.4–76.8)
PLATELETS: 159 10*3/uL (ref 145–400)
RBC: 5.13 10*6/uL (ref 3.70–5.45)
RDW: 13.3 % (ref 11.2–14.5)
WBC: 4.5 10*3/uL (ref 3.9–10.3)

## 2015-01-10 NOTE — Progress Notes (Signed)
Jennifer Frazier  [phone] [phone]) [phone]     ID: [id number] Jennifer Frazier DOB: September 29, 1975  MR#: [id number]  XLK#:[id number]  Patient Care Team: Sharilyn Sites, MD as PCP - General (Family Medicine) Chauncey Cruel, MD as Consulting Physician (Oncology) Thea Silversmith, MD as Consulting Physician (Radiation Oncology) [name], MD as Consulting Physician (General Surgery) OTHER MD: Legrand Como Altheimer MD  CHIEF COMPLAINT: Estrogen receptor positive breast cancer  CURRENT TREATMENT: Tamoxifen   BREAST CANCER HISTORY: From the original intake note:  The patient had screening mammography at [facility] showing calcifications in the right breast, and was referred to the breast Center 07/06/2013 for additional views. Right diagnostic mammography confirmed a group of amorphous calcifications in the upper outer right breast measuring 1.2 cm.  Biopsy of this area 07/17/2013 showed ([id number]) ductal carcinoma in situ, high-grade, estrogen receptor 100% positive, and progesterone receptor 96% positive, both with strong staining intensity.  The patient was then referred to surgery and on 07/25/2013 underwent bilateral breast MRI. This showed a breast density category CEA. In the right breast there was an area of clumped nodular enhancement measuring 3 cm. There was also a 1.5 cm circumscribed oval mass in the medial portion of the right breast consistent with a fibroadenoma. The left breast was unremarkable, and there were no abnormal appearing lymph nodes.  On 08/08/2013 the patient underwent right lumpectomy and sentinel lymph node sampling. The pathology ([id number] was (showed, in addition to ductal carcinoma in situ, invasive ductal carcinoma, measuring 0.7 cm, grade 3, estrogen receptor 86% positive, progesterone receptor 89% positive, both with strong staining intensity, with an MIB-1 of 62%, and no HER-2 amplification. Both sentinel lymph nodes were clear.  Margins were ample.   The patient's subsequent history is as detailed below  INTERVAL HISTORY: Essica returns today for follow up of her breast cancer, accompanied by her mother. She has been on tamoxifen since May 2016 and is tolerating this drug reasonably well. Her hot flashes are better managed. She is on venlafaxine and gabapentin at night. The gabapentin also helps her sleep better. She has minimal vaginal wetness.   REVIEW OF SYSTEMS: [name]'s main complaint continues to be weight loss. She is finishing up the [employer] program and lost 3 inches off her waist, but she has not seen the weight loss that she desires. She believes her diet is good for the most part. She goes to the gym twice weekly outside of [employer]. Her left knee sometimes feels like it is going to give out, but it is not painful. She has occasional low back pain. She has shooting pains to her right breast. She endorses anxiety but is not depressed. She loves her new job. A detailed review of systems is otherwise stable.  PAST MEDICAL HISTORY: Past Medical History  Diagnosis Date  . Fibroids     uterine fibroids  . Anemia   . Thyroid disease   . Contact lens/glasses fitting     wears contacts or glasses  . Sleep apnea     has a cpap-does not always use it  . Cancer ([place])     Breast  . Radiation 04/04/14-05/21/14    Right upper-outer breast  . Hypertension   . Hypothyroidism   . Depression   . Anxiety     PAST SURGICAL HISTORY: Past Surgical History  Procedure Laterality Date  . [place]  2012    urerine ablasion  . Eye surgery  2013    retinal tear-lazer-both  .  Wisdom tooth extraction    . Breast surgery      lumpectomy 7/14  . Portacath placement Right 09/29/2013    Procedure: INSERTION PORT-A-CATH WITH ULTRA SOUND;  Surgeon: [name], MD;  Location: [facility];  Service: General;  Laterality: Right;  . Port a cath revision N/A 12/19/2013    Procedure: REPOSITION PORT;  Surgeon: [name], MD;  Location: [facility];  Service: General;  Laterality: N/A;  . Port-a-cath removal Right 08/08/2014    Procedure: REMOVAL PORT-A-CATH;  Surgeon: [name], MD;  Location: [facility];  Service: General;  Laterality: Right;    FAMILY HISTORY Family History  Problem Relation Age of Onset  . Cancer Father 79    colon  . Cancer Maternal Aunt 50    breast cancer  . Cancer Paternal Aunt 32    breast cancer  . Cancer Maternal Grandmother 28    ovarian or uterine cancer  . Cancer Paternal Grandmother     unknown primary   the patient's father died at the age of 27, been diagnosed with colon cancer at the age of 61. The patient's mother is living, currently 3 years old. The patient has one brother, no sisters. One of the patient's mother's sisters was diagnosed with breast cancer at the age of 87 in one of the patient's father's mother was diagnosed with breast cancer at the age of 36. There is no history of ovarian cancer in the family.  GYNECOLOGIC HISTORY:  No LMP recorded. Patient is not currently having periods (Reason: Chemotherapy). Menarche age 64, for the patient is GX P0. She was still having regular periods at the start of chemotherapy. She used oral contraceptives for approximately one year with no complications.  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 She is single and her mother lives with her.(The patient's mother is wheelchair-bound secondary to a stroke).    ADVANCED DIRECTIVES: Not in place; at the 08/24/2013 visit the patient was given the appropriate documents to come feel notarize associate may declare healthcare power of attorney.   HEALTH MAINTENANCE: Social History  Substance Use Topics  . Smoking status: Never Smoker   . Smokeless tobacco: Never Used  . Alcohol Use: No       Colonoscopy:  PAP:  Bone density:  Lipid panel:  Allergies  Allergen Reactions  . Gadolinium      Desc: NAUSEA WITH MRI CONTRAST-NO VOMITING   . Tramadol Hives and Nausea Only    Current Outpatient Prescriptions  Medication Sig Dispense Refill  . buPROPion (WELLBUTRIN XL) 150 MG 24 hr tablet Take 1 tablet (150 mg total) by mouth daily. Started on 04/28/14 30 tablet 3  . Cholecalciferol (VITAMIN D3) 3000 UNITS TABS Take 10,000 Units by mouth daily.    . diphenhydramine-acetaminophen (TYLENOL PM) 25-500 MG TABS Take 1 tablet by mouth at bedtime as needed (pain/headache).    . gabapentin (NEURONTIN) 300 MG capsule take 1 capsule by mouth at bedtime 30 capsule 2  . hydrochlorothiazide (HYDRODIURIL) 25 MG tablet Take 1 tablet (25 mg total) by mouth daily. 30 tablet 3  . tamoxifen (NOLVADEX) 20 MG tablet Take 1 tablet (20 mg total) by mouth daily. 90 tablet 4  . venlafaxine XR (EFFEXOR-XR) 37.5 MG 24 hr capsule Take 1 capsule (37.5 mg total) by mouth daily with breakfast. 30  capsule 12  . vitamin B-12 (CYANOCOBALAMIN) 1000 MCG tablet Take 5,000 mcg by mouth daily.    [name] ibuprofen (ADVIL,MOTRIN) 600 MG tablet Reported on 01/10/2015  0   No current facility-administered medications for this visit.    OBJECTIVE: Middle-aged [place] American woman who appears stated age  26 Vitals:   01/10/15 1200  BP: 139/88  Pulse: 80  Temp: 97.8 F (36.6 C)  Resp: 16     Body mass index is 38.64 kg/(m^2).    ECOG FS:1 - Symptomatic but completely ambulatory  Sclerae unicteric, pupils round and equal Oropharynx clear and moist-- no thrush or other lesions No cervical or supraclavicular adenopathy Lungs no rales or rhonchi Heart regular rate and rhythm Abd soft, obese,nontender, positive bowel sounds MSK no focal spinal tenderness, no upper extremity lymphedema Neuro: nonfocal, well oriented, appropriate affect Breasts: the right breast is status post lumpectomy and radiation. There is  still some hyperpigmentation, but I do not palpate any suspicious masses. There are no other skin or nipple changes of concern. The right axilla is benign per the left breast is unremarkable.  LAB RESULTS:  CMP     Component Value Date/Time   NA 140 01/10/2015 1136   NA 139 08/07/2014 1325   K 3.5 01/10/2015 1136   K 4.4 08/07/2014 1325   CL 100* 08/07/2014 1325   CO2 30* 01/10/2015 1136   CO2 30 08/07/2014 1325   GLUCOSE 87 01/10/2015 1136   GLUCOSE 77 08/07/2014 1325   BUN 15.1 01/10/2015 1136   BUN 16 08/07/2014 1325   CREATININE 1.1 01/10/2015 1136   CREATININE 0.92 08/07/2014 1325   CALCIUM 10.1 01/10/2015 1136   CALCIUM 10.1 08/07/2014 1325   PROT 7.9 01/10/2015 1136   PROT 7.7 08/07/2014 1325   ALBUMIN 3.8 01/10/2015 1136   ALBUMIN 4.1 08/07/2014 1325   AST 18 01/10/2015 1136   AST 19 08/07/2014 1325   ALT 12 01/10/2015 1136   ALT 14 08/07/2014 1325   ALKPHOS 77 01/10/2015 1136   ALKPHOS 96 08/07/2014 1325   BILITOT 0.48 01/10/2015 1136   BILITOT 0.6 08/07/2014 1325   GFRNONAA >60 08/07/2014 1325   GFRAA >60 08/07/2014 1325    I No results found for: SPEP  Lab Results  Component Value Date   WBC 4.5 01/10/2015   NEUTROABS 2.9 01/10/2015   HGB 14.6 01/10/2015   HCT 43.7 01/10/2015   MCV 85.2 01/10/2015   PLT 159 01/10/2015      Chemistry      Component Value Date/Time   NA 140 01/10/2015 1136   NA 139 08/07/2014 1325   K 3.5 01/10/2015 1136   K 4.4 08/07/2014 1325   CL 100* 08/07/2014 1325   CO2 30* 01/10/2015 1136   CO2 30 08/07/2014 1325   BUN 15.1 01/10/2015 1136   BUN 16 08/07/2014 1325   CREATININE 1.1 01/10/2015 1136   CREATININE 0.92 08/07/2014 1325      Component Value Date/Time   CALCIUM 10.1 01/10/2015 1136   CALCIUM 10.1 08/07/2014 1325   ALKPHOS 77 01/10/2015 1136   ALKPHOS 96 08/07/2014 1325   AST 18 01/10/2015 1136   AST 19 08/07/2014 1325   ALT 12 01/10/2015 1136   ALT 14 08/07/2014 1325   BILITOT 0.48 01/10/2015 1136    BILITOT 0.6 08/07/2014 1325       No results found for: LABCA2  No components found for: LABCA125  No results for input(s): INR in the last 168 hours.  Urinalysis    Component Value Date/Time   COLORURINE YELLOW 02/02/2014 0845   APPEARANCEUR CLEAR 02/02/2014 0845   LABSPEC 1.009 02/02/2014 0845   PHURINE 6.0 02/02/2014 0845   GLUCOSEU NEGATIVE 02/02/2014 0845   HGBUR NEGATIVE 02/02/2014 0845   BILIRUBINUR NEGATIVE 02/02/2014 0845   KETONESUR NEGATIVE 02/02/2014 0845   PROTEINUR NEGATIVE 02/02/2014 0845   UROBILINOGEN 0.2 02/02/2014 0845   NITRITE NEGATIVE 02/02/2014 0845   LEUKOCYTESUR TRACE* 02/02/2014 0845    STUDIES: No results found.   ASSESSMENT: 38 y.o. BRCA negative [place] woman status post right breast biopsy 07/17/2013 for high-grade ductal carcinoma in situ, estrogen and progesterone receptor positive  (1) status post right lumpectomy and sentinel lymph node sampling 08/08/2013 for a pT1b pN0, stage IA invasive ductal carcinoma, grade 3, estrogen and progesterone receptor positive, with an MIB-1 of 62% and no HER-2 amplification  (2) genetics sent 08/02/2013 was normal but did identify a variant of uncertain significance called BRCA2, p.[id number].  (3) Oncotype score of 23 predicts a risk of outside the breast recurrence within 10 years of 15% if the patient's only systemic treatment is tamoxifen for 5 years. Adjuvant chemotherapy was recommended  (4) doxorubicin and cyclophosphamide in dose dense fashion x4 completed 11/16/2013, followed by paclitaxel weekly x12 completed 03/08/2014  (5) adjuvant radiation 3/9/206-05/21/2014:  Right breast / 45 Gray @ 1.8 [place] Cables per fraction x 25 fractions Right breast boost / 16 Gray at [employer] per fraction x 8 fractions  (6) tamoxifen started 06/20/2014   PLAN: Ariday is doing well overall today. She is now 1.5 years out from her definitive surgery with no evidence of recurrent disease. The labs were reviewed in detail  and were stable. She is tolerating the tamoxifen well and will continue this drug for at least 2 years (but likely 5 years) before considering switching to an aromatase inhibitor. She has asked that we check an estrogen level at her next visit to see if she is truly in menopause since her singular spotting episode last month.   We spent an additional 20 minutes discussing healthy eating behaviors and its role in weight loss. I asked her to focus on protein and vegetable intake, and minimize carbs. She is going to incorporate more strenuous activity into her workout routine other than just walking. She is interested in an "[employer]" protein diet, which should be in line with her goals.  Taleisha will return in 3 months per Dr. Virgie Dad previous progress note. She understands and agrees with this plan. She knows the goal of treatment in her case is cure. She has been encouraged to call with any issues that might arise before her next visit here.   Laurie Panda, NP   01/10/2015 1:16 PM

## 2015-01-10 NOTE — Telephone Encounter (Signed)
Appointments made and avs printed for patient °

## 2015-02-17 ENCOUNTER — Encounter (HOSPITAL_COMMUNITY): Payer: Self-pay | Admitting: Emergency Medicine

## 2015-02-17 ENCOUNTER — Emergency Department (HOSPITAL_COMMUNITY)
Admission: EM | Admit: 2015-02-17 | Discharge: 2015-02-17 | Disposition: A | Discharge status: Home / Self Care | Payer: 59 | Attending: Emergency Medicine | Admitting: Emergency Medicine

## 2015-02-17 ENCOUNTER — Other Ambulatory Visit: Payer: Self-pay | Admitting: Nurse Practitioner

## 2015-02-17 DIAGNOSIS — Z79899 Other long term (current) drug therapy: Secondary | ICD-10-CM | POA: Diagnosis not present

## 2015-02-17 DIAGNOSIS — G4730 Sleep apnea, unspecified: Secondary | ICD-10-CM | POA: Diagnosis not present

## 2015-02-17 DIAGNOSIS — Z862 Personal history of diseases of the blood and blood-forming organs and certain disorders involving the immune mechanism: Secondary | ICD-10-CM | POA: Diagnosis not present

## 2015-02-17 DIAGNOSIS — W268XXA Contact with other sharp object(s), not elsewhere classified, initial encounter: Secondary | ICD-10-CM | POA: Diagnosis not present

## 2015-02-17 DIAGNOSIS — Z853 Personal history of malignant neoplasm of breast: Secondary | ICD-10-CM | POA: Diagnosis not present

## 2015-02-17 DIAGNOSIS — Z8639 Personal history of other endocrine, nutritional and metabolic disease: Secondary | ICD-10-CM | POA: Insufficient documentation

## 2015-02-17 DIAGNOSIS — Z79810 Long term (current) use of selective estrogen receptor modulators (SERMs): Secondary | ICD-10-CM | POA: Insufficient documentation

## 2015-02-17 DIAGNOSIS — Y9389 Activity, other specified: Secondary | ICD-10-CM | POA: Insufficient documentation

## 2015-02-17 DIAGNOSIS — S61012A Laceration without foreign body of left thumb without damage to nail, initial encounter: Secondary | ICD-10-CM

## 2015-02-17 DIAGNOSIS — Z86018 Personal history of other benign neoplasm: Secondary | ICD-10-CM | POA: Insufficient documentation

## 2015-02-17 DIAGNOSIS — F419 Anxiety disorder, unspecified: Secondary | ICD-10-CM | POA: Insufficient documentation

## 2015-02-17 DIAGNOSIS — Y998 Other external cause status: Secondary | ICD-10-CM | POA: Insufficient documentation

## 2015-02-17 DIAGNOSIS — I10 Essential (primary) hypertension: Secondary | ICD-10-CM | POA: Diagnosis not present

## 2015-02-17 DIAGNOSIS — Z23 Encounter for immunization: Secondary | ICD-10-CM | POA: Diagnosis not present

## 2015-02-17 DIAGNOSIS — F329 Major depressive disorder, single episode, unspecified: Secondary | ICD-10-CM | POA: Insufficient documentation

## 2015-02-17 DIAGNOSIS — Y9289 Other specified places as the place of occurrence of the external cause: Secondary | ICD-10-CM | POA: Insufficient documentation

## 2015-02-17 DIAGNOSIS — S6992XA Unspecified injury of left wrist, hand and finger(s), initial encounter: Secondary | ICD-10-CM | POA: Diagnosis present

## 2015-02-17 MED ORDER — TETANUS-DIPHTH-ACELL PERTUSSIS 5-2.5-18.5 LF-MCG/0.5 IM SUSP
0.5000 mL | Freq: Once | INTRAMUSCULAR | Status: AC
  Administered 2015-02-17: 0.5 mL via INTRAMUSCULAR
  Filled 2015-02-17: qty 0.5

## 2015-02-17 NOTE — ED Notes (Signed)
PA at bedside.

## 2015-02-17 NOTE — ED Provider Notes (Signed)
CSN: [id number]     Arrival date & time 02/17/15  [id number] History   First MD Initiated Contact with Patient 02/17/15 [phone]     No chief complaint on file.    (Consider location/radiation/quality/duration/timing/severity/associated sxs/prior Treatment) Patient is a 40 y.o. female presenting with skin laceration. The history is provided by the patient.  Laceration Location:  Hand Hand laceration location:  L finger Length (cm):  1.8 Quality comment:  Flap Bleeding: controlled   Time since incident: just prior to ED admission. Laceration mechanism:  Metal edge (blinder blade) Pain details:    Severity:  Mild   Timing:  Intermittent   Progression:  Unchanged Foreign body present:  No foreign bodies Tetanus status:  Out of date   Past Medical History  Diagnosis Date  . Fibroids     uterine fibroids  . Anemia   . Thyroid disease   . Contact lens/glasses fitting     wears contacts or glasses  . Sleep apnea     has a cpap-does not always use it  . Cancer ([place])     Breast  . Radiation 04/04/14-05/21/14    Right upper-outer breast  . Hypertension   . Hypothyroidism   . Depression   . Anxiety    Past Surgical History  Procedure Laterality Date  . [place]  2012    urerine ablasion  . Eye surgery  2013    retinal tear-lazer-both  . Wisdom tooth extraction    . Breast surgery      lumpectomy 7/14  . Portacath placement Right 09/29/2013    Procedure: INSERTION PORT-A-CATH WITH ULTRA SOUND;  Surgeon: [name], MD;  Location: [facility];  Service: General;  Laterality: Right;  . Port a cath revision N/A 12/19/2013    Procedure: REPOSITION PORT;  Surgeon: [name], MD;  Location: [facility];  Service: General;  Laterality: N/A;  . Port-a-cath removal Right 08/08/2014    Procedure: REMOVAL PORT-A-CATH;  Surgeon: [name], MD;  Location: [facility];  Service: General;  Laterality: Right;   Family History  Problem Relation Age  of Onset  . Cancer Father 109    colon  . Cancer Maternal Aunt 50    breast cancer  . Cancer Paternal Aunt 15    breast cancer  . Cancer Maternal Grandmother 32    ovarian or uterine cancer  . Cancer Paternal Grandmother     unknown primary   Social History  Substance Use Topics  . Smoking status: Never Smoker   . Smokeless tobacco: Never Used  . Alcohol Use: No   OB History    Obstetric Comments   Menses age 87 or 66 Last menstrual period 08/07/13 No children, no pregnancies     Review of Systems  Constitutional: Negative for activity change.       All ROS Neg except as noted in HPI  HENT: Negative for nosebleeds.   Eyes: Negative for photophobia and discharge.  Respiratory: Negative for cough, shortness of breath and wheezing.   Cardiovascular: Negative for chest pain and palpitations.  Gastrointestinal: Negative for abdominal pain and blood in stool.  Genitourinary: Negative for dysuria, frequency and hematuria.  Musculoskeletal: Negative for back pain, arthralgias and neck pain.  Skin: Negative.   Neurological: Negative for dizziness, seizures and speech difficulty.  Psychiatric/Behavioral: Negative for hallucinations and confusion. The patient is nervous/anxious.       Allergies  Gadolinium and Tramadol  Home Medications   Prior to  Admission medications   Medication Sig Start Date End Date Taking? Authorizing Provider  buPROPion (WELLBUTRIN XL) 150 MG 24 hr tablet Take 1 tablet (150 mg total) by mouth daily. Started on 04/28/14 12/21/14   Chauncey Cruel, MD  Cholecalciferol (VITAMIN D3) 3000 UNITS TABS Take 10,000 Units by mouth daily.    Historical Provider, MD  diphenhydramine-acetaminophen (TYLENOL PM) 25-500 MG TABS Take 1 tablet by mouth at bedtime as needed (pain/headache).    Historical Provider, MD  gabapentin (NEURONTIN) 300 MG capsule take 1 capsule by mouth at bedtime 11/19/14   Laurie Panda, NP  hydrochlorothiazide (HYDRODIURIL) 25 MG tablet  Take 1 tablet (25 mg total) by mouth daily. 11/19/14   Chauncey Cruel, MD  ibuprofen (ADVIL,MOTRIN) 600 MG tablet Reported on 01/10/2015 08/08/14   Historical Provider, MD  tamoxifen (NOLVADEX) 20 MG tablet Take 1 tablet (20 mg total) by mouth daily. 10/19/14   Chauncey Cruel, MD  venlafaxine XR (EFFEXOR-XR) 37.5 MG 24 hr capsule Take 1 capsule (37.5 mg total) by mouth daily with breakfast. 10/17/14   Chauncey Cruel, MD  vitamin B-12 (CYANOCOBALAMIN) 1000 MCG tablet Take 5,000 mcg by mouth daily.    Historical Provider, MD   There were no vitals taken for this visit. Physical Exam  Constitutional: She is oriented to person, place, and time. She appears well-developed and well-nourished.  Non-toxic appearance.  HENT:  Head: Normocephalic.  Right Ear: Tympanic membrane and external ear normal.  Left Ear: Tympanic membrane and external ear normal.  Eyes: EOM and lids are normal. Pupils are equal, round, and reactive to light.  Neck: Normal range of motion. Neck supple. Carotid bruit is not present.  Cardiovascular: Normal rate, regular rhythm, normal heart sounds, intact distal pulses and normal pulses.   Pulmonary/Chest: Breath sounds normal. No respiratory distress.  Abdominal: Soft. Bowel sounds are normal. There is no tenderness. There is no guarding.  Musculoskeletal: Normal range of motion.       Hands: Lymphadenopathy:       Head (right side): No submandibular adenopathy present.       Head (left side): No submandibular adenopathy present.    She has no cervical adenopathy.  Neurological: She is alert and oriented to person, place, and time. She has normal strength. No cranial nerve deficit or sensory deficit.  Skin: Skin is warm and dry.  Psychiatric: She has a normal mood and affect. Her speech is normal.  Nursing note and vitals reviewed.   ED Course  .Marland KitchenLaceration Repair Date/Time: 02/17/2015 10:05 AM Performed by: Lily Kocher Authorized by: Lily Kocher Consent:  Verbal consent obtained. Risks and benefits: risks, benefits and alternatives were discussed Consent given by: patient Patient understanding: patient states understanding of the procedure being performed Patient identity confirmed: arm band Time out: Immediately prior to procedure a "time out" was called to verify the correct patient, procedure, equipment, support staff and site/side marked as required. Body area: upper extremity Location details: left thumb Laceration length: 1.8 cm Foreign bodies: no foreign bodies Tendon involvement: none Patient sedated: no Preparation: Patient was prepped and draped in the usual sterile fashion. Irrigation solution: tap water Amount of cleaning: standard Skin closure: glue Approximation: loose Approximation difficulty: simple Patient tolerance: Patient tolerated the procedure well with no immediate complications   (including critical care time) Labs Review Labs Reviewed - No data to display  Imaging Review No results found. I have personally reviewed and evaluated these images and lab results as part of my  medical decision-making.   EKG Interpretation None      MDM  Vital signs stable. Wound to the left thumb repaired with Dermabond. Discussed the need to return if any changes or signs of infection. Pt is in agreement with discharge plan. Tetanus status updated.    Final diagnoses:  None    *I have reviewed nursing notes, vital signs, and all appropriate lab and imaging results for this patient.[address], PA-C 02/19/15 [place], MD 02/20/15 [phone]

## 2015-02-17 NOTE — Discharge Instructions (Signed)
Your wound has been repaired with Dermabond. This will come off on its own and about 7-10 days. Please do not apply any petroleum products to the thumb area, as this will break down the Dermabond. Please see your primary physician, or return to the emergency department if any signs of advancing infection. Stitches, Staples, or Adhesive Wound Closure Health care providers use stitches (sutures), staples, and certain glue (skin adhesives) to hold skin together while it heals (wound closure). You may need this treatment after you have surgery or if you cut your skin accidentally. These methods help your skin to heal more quickly and make it less likely that you will have a scar. A wound may take several months to heal completely. The type of wound you have determines when your wound gets closed. In most cases, the wound is closed as soon as possible (primary skin closure). Sometimes, closure is delayed so the wound can be cleaned and allowed to heal naturally. This reduces the chance of infection. Delayed closure may be needed if your wound:  Is caused by a bite.  Happened more than 6 hours ago.  Involves loss of skin or the tissues under the skin.  Has dirt or debris in it that cannot be removed.  Is infected. WHAT ARE THE DIFFERENT KINDS OF WOUND CLOSURES? There are many options for wound closure. The one that your health care provider uses depends on how deep and how large your wound is. Adhesive Glue To use this type of glue to close a wound, your health care provider holds the edges of the wound together and paints the glue on the surface of your skin. You may need more than one layer of glue. Then the wound may be covered with a light bandage (dressing). This type of skin closure may be used for small wounds that are not deep (superficial). Using glue for wound closure is less painful than other methods. It does not require a medicine that numbs the area (local anesthetic). This method also  leaves nothing to be removed. Adhesive glue is often used for children and on facial wounds. Adhesive glue cannot be used for wounds that are deep, uneven, or bleeding. It is not used inside of a wound.  Adhesive Strips These strips are made of sticky (adhesive), porous paper. They are applied across your skin edges like a regular adhesive bandage. You leave them on until they fall off. Adhesive strips may be used to close very superficial wounds. They may also be used along with sutures to improve the closure of your skin edges.  Sutures Sutures are the oldest method of wound closure. Sutures can be made from natural substances, such as silk, or from synthetic materials, such as nylon and steel. They can be made from a material that your body can break down as your wound heals (absorbable), or they can be made from a material that needs to be removed from your skin (nonabsorbable). They come in many different strengths and sizes. Your health care provider attaches the sutures to a steel needle on one end. Sutures can be passed through your skin, or through the tissues beneath your skin. Then they are tied and cut. Your skin edges may be closed in one continuous stitch or in separate stitches. Sutures are strong and can be used for all kinds of wounds. Absorbable sutures may be used to close tissues under the skin. The disadvantage of sutures is that they may cause skin reactions that lead to infection.  Nonabsorbable sutures need to be removed. Staples When surgical staples are used to close a wound, the edges of your skin on both sides of the wound are brought close together. A staple is placed across the wound, and an instrument secures the edges together. Staples are often used to close surgical cuts (incisions). Staples are faster to use than sutures, and they cause less skin reaction. Staples need to be removed using a tool that bends the staples away from your skin. HOW DO I CARE FOR MY WOUND  CLOSURE?  Take medicines only as directed by your health care provider.  If you were prescribed an antibiotic medicine for your wound, finish it all even if you start to feel better.  Use ointments or creams only as directed by your health care provider.  Wash your hands with soap and water before and after touching your wound.  Do not soak your wound in water. Do not take baths, swim, or use a hot tub until your health care provider approves.  Ask your health care provider when you can start showering. Cover your wound if directed by your health care provider.  Do not take out your own sutures or staples.  Do not pick at your wound. Picking can cause an infection.  Keep all follow-up visits as directed by your health care provider. This is important. HOW LONG WILL I HAVE MY WOUND CLOSURE?  Leave adhesive glue on your skin until the glue peels away.  Leave adhesive strips on your skin until the strips fall off.  Absorbable sutures will dissolve within several days.  Nonabsorbable sutures and staples must be removed. The location of the wound will determine how long they stay in. This can range from several days to a couple of weeks. WHEN SHOULD I SEEK HELP FOR MY WOUND CLOSURE? Contact your health care provider if:  You have a fever.  You have chills.  You have drainage, redness, swelling, or pain at your wound.  There is a bad smell coming from your wound.  The skin edges of your wound start to separate after your sutures have been removed.  Your wound becomes thick, raised, and darker in color after your sutures come out (scarring).   This information is not intended to replace advice given to you by your health care provider. Make sure you discuss any questions you have with your health care provider.   Document Released: 10/07/2000 Document Revised: 02/02/2014 Document Reviewed: 06/21/2013 Elsevier Interactive Patient Education [employer].

## 2015-02-17 NOTE — ED Notes (Signed)
Pt reports making a shake this morning in blender and when washing blade, lacerated left thumb.  Bleeding controlled at this time.

## 2015-02-18 ENCOUNTER — Other Ambulatory Visit: Payer: Self-pay | Admitting: *Deleted

## 2015-02-18 MED ORDER — GABAPENTIN 300 MG PO CAPS: 300.0000 mg | ORAL_CAPSULE | Freq: Every day | ORAL | Status: DC

## 2015-03-30 DIAGNOSIS — N911 Secondary amenorrhea: Secondary | ICD-10-CM | POA: Insufficient documentation

## 2015-04-17 ENCOUNTER — Ambulatory Visit: Payer: 59 | Admitting: Oncology

## 2015-04-17 ENCOUNTER — Other Ambulatory Visit: Payer: 59

## 2015-04-26 ENCOUNTER — Other Ambulatory Visit: Payer: Self-pay | Admitting: *Deleted

## 2015-04-26 MED ORDER — VENLAFAXINE HCL ER 37.5 MG PO CP24: 37.5000 mg | ORAL_CAPSULE | Freq: Every day | ORAL | Status: DC

## 2015-04-29 ENCOUNTER — Telehealth: Payer: Self-pay | Admitting: *Deleted

## 2015-04-29 DIAGNOSIS — C50411 Malignant neoplasm of upper-outer quadrant of right female breast: Secondary | ICD-10-CM

## 2015-04-29 DIAGNOSIS — N644 Mastodynia: Secondary | ICD-10-CM

## 2015-04-29 NOTE — Telephone Encounter (Signed)
Pt called to state ongoing discomfort in her upper outer right breast not adjacent to the surgical site.  Area has filling of fullness " just doesn't feel normal in this area "  Pt thought area might be irritated by bra ( she sleeps in a bra ) but over the weekend did not wear her bra as much and area is still noticeable.  Per above- mammo and u/s will be obtained prior to upcoming visit.

## 2015-05-01 ENCOUNTER — Other Ambulatory Visit: Payer: Self-pay | Admitting: Oncology

## 2015-05-01 ENCOUNTER — Telehealth: Payer: Self-pay | Admitting: *Deleted

## 2015-05-01 DIAGNOSIS — C50411 Malignant neoplasm of upper-outer quadrant of right female breast: Secondary | ICD-10-CM

## 2015-05-01 DIAGNOSIS — D0512 Intraductal carcinoma in situ of left breast: Secondary | ICD-10-CM

## 2015-05-01 NOTE — Telephone Encounter (Signed)
Writer called breast imaging center.  Orders placed incorrectly, new orders placed per GI.  Breast center to call patient this morning to schedule mammo and [name].

## 2015-05-01 NOTE — Telephone Encounter (Signed)
TC from patient inquiring about appts for her mammogram and [name] of her right breast prior to seeing Dr. Jana Hakim next week on the 12th.  She is wondering when those procedures will be scheduled and where.  Pt spoke with Tivis Ringer, RN on 04/29/15

## 2015-05-06 ENCOUNTER — Other Ambulatory Visit: Payer: Self-pay | Admitting: *Deleted

## 2015-05-06 DIAGNOSIS — F329 Major depressive disorder, single episode, unspecified: Secondary | ICD-10-CM

## 2015-05-06 DIAGNOSIS — F32A Depression, unspecified: Secondary | ICD-10-CM

## 2015-05-06 DIAGNOSIS — C50411 Malignant neoplasm of upper-outer quadrant of right female breast: Secondary | ICD-10-CM

## 2015-05-06 DIAGNOSIS — R232 Flushing: Secondary | ICD-10-CM

## 2015-05-06 MED ORDER — BUPROPION HCL ER (XL) 150 MG PO TB24: 150.0000 mg | ORAL_TABLET | Freq: Every day | ORAL | Status: DC

## 2015-05-07 ENCOUNTER — Ambulatory Visit
Admission: RE | Admit: 2015-05-07 | Discharge: 2015-05-07 | Disposition: A | Discharge status: Home / Self Care | Payer: 59 | Source: Ambulatory Visit | Attending: Oncology | Admitting: Oncology

## 2015-05-07 DIAGNOSIS — D0512 Intraductal carcinoma in situ of left breast: Secondary | ICD-10-CM

## 2015-05-07 DIAGNOSIS — C50411 Malignant neoplasm of upper-outer quadrant of right female breast: Secondary | ICD-10-CM

## 2015-05-08 ENCOUNTER — Ambulatory Visit (HOSPITAL_BASED_OUTPATIENT_CLINIC_OR_DEPARTMENT_OTHER): Payer: 59 | Admitting: Oncology

## 2015-05-08 ENCOUNTER — Other Ambulatory Visit (HOSPITAL_BASED_OUTPATIENT_CLINIC_OR_DEPARTMENT_OTHER): Payer: 59

## 2015-05-08 ENCOUNTER — Telehealth: Payer: Self-pay | Admitting: Oncology

## 2015-05-08 VITALS — BP 167/97 | HR 67 | Temp 98.2°F | Resp 18 | Ht 69.0 in | Wt 268.5 lb

## 2015-05-08 DIAGNOSIS — Z170 Estrogen receptor positive status [ER+]: Secondary | ICD-10-CM | POA: Diagnosis not present

## 2015-05-08 DIAGNOSIS — Z79810 Long term (current) use of selective estrogen receptor modulators (SERMs): Secondary | ICD-10-CM | POA: Diagnosis not present

## 2015-05-08 DIAGNOSIS — C50411 Malignant neoplasm of upper-outer quadrant of right female breast: Secondary | ICD-10-CM | POA: Diagnosis not present

## 2015-05-08 LAB — COMPREHENSIVE METABOLIC PANEL
ALBUMIN: 3.7 g/dL (ref 3.5–5.0)
ALK PHOS: 75 U/L (ref 40–150)
ALT: 15 U/L (ref 0–55)
AST: 21 U/L (ref 5–34)
Anion Gap: 8 mEq/L (ref 3–11)
BUN: 15.5 mg/dL (ref 7.0–26.0)
CO2: 28 meq/L (ref 22–29)
Calcium: 10.2 mg/dL (ref 8.4–10.4)
Chloride: 106 mEq/L (ref 98–109)
Creatinine: 1.1 mg/dL (ref 0.6–1.1)
EGFR: 72 mL/min/{1.73_m2} — AB (ref >90)
GLUCOSE: 91 mg/dL (ref 70–140)
POTASSIUM: 4 meq/L (ref 3.5–5.1)
SODIUM: 142 meq/L (ref 136–145)
Total Bilirubin: 0.44 mg/dL (ref 0.20–1.20)
Total Protein: 7.8 g/dL (ref 6.4–8.3)

## 2015-05-08 LAB — CBC WITH DIFFERENTIAL/PLATELET
BASO%: 0.2 % (ref 0.0–2.0)
BASOS ABS: 0 10*3/uL (ref 0.0–0.1)
EOS ABS: 0.1 10*3/uL (ref 0.0–0.5)
EOS%: 1.6 % (ref 0.0–7.0)
HCT: 44 % (ref 34.8–46.6)
HGB: 14.8 g/dL (ref 11.6–15.9)
LYMPH%: 33 % (ref 14.0–49.7)
MCH: 28.5 pg (ref 25.1–34.0)
MCHC: 33.6 g/dL (ref 31.5–36.0)
MCV: 84.6 fL (ref 79.5–101.0)
MONO#: 0.2 10*3/uL (ref 0.1–0.9)
MONO%: 4.3 % (ref 0.0–14.0)
NEUT#: 2.7 10*3/uL (ref 1.5–6.5)
NEUT%: 60.9 % (ref 38.4–76.8)
Platelets: 147 10*3/uL (ref 145–400)
RBC: 5.2 10*6/uL (ref 3.70–5.45)
RDW: 13.4 % (ref 11.2–14.5)
WBC: 4.4 10*3/uL (ref 3.9–10.3)
lymph#: 1.5 10*3/uL (ref 0.9–3.3)

## 2015-05-08 MED ORDER — GABAPENTIN 300 MG PO CAPS: 300.0000 mg | ORAL_CAPSULE | Freq: Every day | ORAL | Status: DC

## 2015-05-08 MED ORDER — VENLAFAXINE HCL ER 75 MG PO CP24: 75.0000 mg | ORAL_CAPSULE | Freq: Every day | ORAL | Status: DC

## 2015-05-08 MED ORDER — HYDROCHLOROTHIAZIDE 25 MG PO TABS: 25.0000 mg | ORAL_TABLET | Freq: Every day | ORAL | Status: DC

## 2015-05-08 NOTE — Progress Notes (Signed)
Jennifer Frazier  [phone] [phone]) [phone]     ID: Jennifer Frazier DOB: August 05, 1975  MR#: [id number]  FOY#:[id number]  Patient Care Team: Sharilyn Sites, MD as PCP - General (Family Medicine) Chauncey Cruel, MD as Consulting Physician (Oncology) Thea Silversmith, MD as Consulting Physician (Radiation Oncology) [name], MD as Consulting Physician (General Surgery) OTHER MD: Legrand Como Altheimer MD  CHIEF COMPLAINT: Estrogen receptor positive breast cancer  CURRENT TREATMENT: Tamoxifen   BREAST CANCER HISTORY: From the original intake note:  The patient had screening mammography at [facility] showing calcifications in the right breast, and was referred to the breast Center 07/06/2013 for additional views. Right diagnostic mammography confirmed a group of amorphous calcifications in the upper outer right breast measuring 1.2 cm.  Biopsy of this area 07/17/2013 showed ([id number]) ductal carcinoma in situ, high-grade, estrogen receptor 100% positive, and progesterone receptor 96% positive, both with strong staining intensity.  The patient was then referred to surgery and on 07/25/2013 underwent bilateral breast MRI. This showed a breast density category CEA. In the right breast there was an area of clumped nodular enhancement measuring 3 cm. There was also a 1.5 cm circumscribed oval mass in the medial portion of the right breast consistent with a fibroadenoma. The left breast was unremarkable, and there were no abnormal appearing lymph nodes.  On 08/08/2013 the patient underwent right lumpectomy and sentinel lymph node sampling. The pathology ([id number] was (showed, in addition to ductal carcinoma in situ, invasive ductal carcinoma, measuring 0.7 cm, grade 3, estrogen receptor 86% positive, progesterone receptor 89% positive, both with strong staining intensity, with an MIB-1 of 62%, and no HER-2 amplification. Both sentinel lymph nodes were clear.  Margins were ample.   The patient's subsequent history is as detailed below  INTERVAL HISTORY: Jennifer Frazier returns today for follow up of her estrogen receptor positivebreast cancer, accompanied by her mother. And she called [name] earlier this month telling as that there was a change in her right breast that she thought needed to be evaluated. We went ahead and scheduled her for mammography and right breast ultrasonography, which was performed 05/07/2015. Her breast densities category C. There were no suspicious masses noted either on mammography or ultrasonography, but this skin thickening on the right breast medially has increased as compared to prior. There was dimpling there as well. Again ultrasound showed no underlying mass and of course she has a fibroadenoma which is incidental. Punch biopsy was recommended. She is very anxious to have that performed.  REVIEW OF SYSTEMS:  Aside from the breast tissue, she is very concerned about continuing weight gain. She is hungry all the time. She is participating in [place] and also a different exercise program 3 nights a week. Despite that she doesn't see any improvement. It isn't just that her weight is going up but that her body doesn't feel more supple or more capable of exercise. In addition she ran out of the bupropion. She hasn't had any for 2 weeks. She has not  Noted any problems except for a mild headache 1 or 2 days after stopping. She doesn't think he was doing anything and does not really wonder go back on it. She is tolerating tamoxifen generally well, with hot flashes as the major issue. Also her toes particularly in the left big toe, is still black. The other ones have improved. She has no neuropathy symptoms from her chemotherapy. A detailed review of systems today was otherwise stable.  PAST MEDICAL  HISTORY: Past Medical History  Diagnosis Date  . Fibroids     uterine fibroids  . Anemia   . Thyroid disease   . Contact lens/glasses fitting      wears contacts or glasses  . Sleep apnea     has a cpap-does not always use it  . Cancer ([place])     Breast  . Radiation 04/04/14-05/21/14    Right upper-outer breast  . Hypertension   . Hypothyroidism   . Depression   . Anxiety     PAST SURGICAL HISTORY: Past Surgical History  Procedure Laterality Date  . [place]  2012    urerine ablasion  . Eye surgery  2013    retinal tear-lazer-both  . Wisdom tooth extraction    . Breast surgery      lumpectomy 7/14  . Portacath placement Right 09/29/2013    Procedure: INSERTION PORT-A-CATH WITH ULTRA SOUND;  Surgeon: [name], MD;  Location: [facility];  Service: General;  Laterality: Right;  . Port a cath revision N/A 12/19/2013    Procedure: REPOSITION PORT;  Surgeon: [name], MD;  Location: [facility];  Service: General;  Laterality: N/A;  . Port-a-cath removal Right 08/08/2014    Procedure: REMOVAL PORT-A-CATH;  Surgeon: [name], MD;  Location: [facility];  Service: General;  Laterality: Right;    FAMILY HISTORY Family History  Problem Relation Age of Onset  . Cancer Father 24    colon  . Cancer Maternal Aunt 50    breast cancer  . Cancer Paternal Aunt 99    breast cancer  . Cancer Maternal Grandmother 66    ovarian or uterine cancer  . Cancer Paternal Grandmother     unknown primary   the patient's father died at the age of 66, been diagnosed with colon cancer at the age of 84. The patient's mother is living, currently 64 years old. The patient has one brother, no sisters. One of the patient's mother's sisters was diagnosed with breast cancer at the age of 67 in one of the patient's father's mother was diagnosed with breast cancer at the age of 21. There is no history of ovarian cancer in the family.  GYNECOLOGIC HISTORY:  No LMP recorded. Patient is not currently having periods (Reason: Chemotherapy). Menarche age 89, for the patient is GX P0. She was still having regular  periods at the start of chemotherapy. She used oral contraceptives for approximately one year with no complications.  Her periods stopped with chemotherapy and have not resumed. She has been evaluated at the fertility center in [place] and they do not feel they would be able to stimulate production.  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. She is single and her mother lives with her.(The patient's mother is wheelchair-bound secondary to a stroke).    ADVANCED DIRECTIVES: Not in place; at the 08/24/2013 visit the patient was given the appropriate documents to come feel notarize associate may declare healthcare power of attorney.   HEALTH MAINTENANCE: Social History  Substance Use Topics  . Smoking status: Never Smoker   . Smokeless tobacco: Never Used  . Alcohol Use: No     Colonoscopy:  PAP:  Bone density:  Lipid panel:  Allergies  Allergen  Reactions  . Gadolinium      Desc: NAUSEA WITH MRI CONTRAST-NO VOMITING   . Tramadol Hives and Nausea Only    Current Outpatient Prescriptions  Medication Sig Dispense Refill  . Cholecalciferol (VITAMIN D3) 3000 UNITS TABS Take 10,000 Units by mouth daily.    . diphenhydrAMINE (BENADRYL) 25 mg capsule Take 25 mg by mouth every 6 (six) hours as needed.    . diphenhydramine-acetaminophen (TYLENOL PM) 25-500 MG TABS Take 1 tablet by mouth at bedtime as needed (pain/headache).    . gabapentin (NEURONTIN) 300 MG capsule Take 1 capsule (300 mg total) by mouth at bedtime. 30 capsule 4  . hydrochlorothiazide (HYDRODIURIL) 25 MG tablet Take 1 tablet (25 mg total) by mouth daily. 30 tablet 3  . tamoxifen (NOLVADEX) 20 MG tablet Take 1 tablet (20 mg total) by mouth daily. 90 tablet 4  . venlafaxine XR  (EFFEXOR-XR) 75 MG 24 hr capsule Take 1 capsule (75 mg total) by mouth daily with breakfast. 90 capsule 4  . vitamin B-12 (CYANOCOBALAMIN) 1000 MCG tablet Take 5,000 mcg by mouth daily.     No current facility-administered medications for this visit.    OBJECTIVE: Middle-aged [place] American woman  In no acute distress Filed Vitals:   05/08/15 0926  BP: 167/97  Pulse: 67  Temp: 98.2 F (36.8 C)  Resp: 18     Body mass index is 39.63 kg/(m^2).    ECOG FS:1 - Symptomatic but completely ambulatory  Sclerae unicteric, EOMs intact Oropharynx clear, dentition in good repair No cervical or supraclavicular adenopathy Lungs no rales or rhonchi Heart regular rate and rhythm Abd soft, nontender, positive bowel sounds MSK no focal spinal tenderness, no upper extremity lymphedema Neuro: nonfocal, well oriented, appropriate affect Breasts:  The right breast is status post lumpectomy (scar is lateral) and radiation. The area were Tykira feels a change is medial. All that  I see there right now is radiation change. This causes some skin thickening and the pores become more noticeable.  The area of the scar tissue in the lateral breast is stable. The right axilla is benign. The left breast is unremarkable  LAB RESULTS:  CMP     Component Value Date/Time   NA 140 01/10/2015 1136   NA 139 08/07/2014 1325   K 3.5 01/10/2015 1136   K 4.4 08/07/2014 1325   CL 100* 08/07/2014 1325   CO2 30* 01/10/2015 1136   CO2 30 08/07/2014 1325   GLUCOSE 87 01/10/2015 1136   GLUCOSE 77 08/07/2014 1325   BUN 15.1 01/10/2015 1136   BUN 16 08/07/2014 1325   CREATININE 1.1 01/10/2015 1136   CREATININE 0.92 08/07/2014 1325   CALCIUM 10.1 01/10/2015 1136   CALCIUM 10.1 08/07/2014 1325   PROT 7.9 01/10/2015 1136   PROT 7.7 08/07/2014 1325   ALBUMIN 3.8 01/10/2015 1136   ALBUMIN 4.1 08/07/2014 1325   AST 18 01/10/2015 1136   AST 19 08/07/2014 1325   ALT 12 01/10/2015 1136   ALT 14 08/07/2014 1325   ALKPHOS  77 01/10/2015 1136   ALKPHOS 96 08/07/2014 1325   BILITOT 0.48 01/10/2015 1136   BILITOT 0.6 08/07/2014 1325   GFRNONAA >60 08/07/2014 1325   GFRAA >60 08/07/2014 1325    I No results found for: SPEP  Lab Results  Component Value Date   WBC 4.4 05/08/2015   NEUTROABS 2.7 05/08/2015   HGB 14.8 05/08/2015   HCT 44.0 05/08/2015   MCV 84.6 05/08/2015   PLT 147 05/08/2015  Chemistry      Component Value Date/Time   NA 140 01/10/2015 1136   NA 139 08/07/2014 1325   K 3.5 01/10/2015 1136   K 4.4 08/07/2014 1325   CL 100* 08/07/2014 1325   CO2 30* 01/10/2015 1136   CO2 30 08/07/2014 1325   BUN 15.1 01/10/2015 1136   BUN 16 08/07/2014 1325   CREATININE 1.1 01/10/2015 1136   CREATININE 0.92 08/07/2014 1325      Component Value Date/Time   CALCIUM 10.1 01/10/2015 1136   CALCIUM 10.1 08/07/2014 1325   ALKPHOS 77 01/10/2015 1136   ALKPHOS 96 08/07/2014 1325   AST 18 01/10/2015 1136   AST 19 08/07/2014 1325   ALT 12 01/10/2015 1136   ALT 14 08/07/2014 1325   BILITOT 0.48 01/10/2015 1136   BILITOT 0.6 08/07/2014 1325       No results found for: LABCA2  No components found for: LABCA125  No results for input(s): INR in the last 168 hours.  Urinalysis    Component Value Date/Time   COLORURINE YELLOW 02/02/2014 0845   APPEARANCEUR CLEAR 02/02/2014 0845   LABSPEC 1.009 02/02/2014 0845   PHURINE 6.0 02/02/2014 0845   GLUCOSEU NEGATIVE 02/02/2014 0845   HGBUR NEGATIVE 02/02/2014 0845   BILIRUBINUR NEGATIVE 02/02/2014 0845   KETONESUR NEGATIVE 02/02/2014 0845   PROTEINUR NEGATIVE 02/02/2014 0845   UROBILINOGEN 0.2 02/02/2014 0845   NITRITE NEGATIVE 02/02/2014 0845   LEUKOCYTESUR TRACE* 02/02/2014 0845    STUDIES: US Breast Ltd Uni Right Inc Axilla  05/07/2015  CLINICAL DATA:  Patient presents with a new area of fullness and tightness along the medial right breast. Patient had a lumpectomy in the right breast upper outer quadrant posteriorly performed in July  2015 with adjuvant radiation therapy. The current area of concern is new. EXAM: 2D DIGITAL DIAGNOSTIC RIGHT MAMMOGRAM WITH CAD AND ADJUNCT TOMO ULTRASOUND RIGHT BREAST COMPARISON:  Previous exam(s). ACR Breast Density Category c: The breast tissue is heterogeneously dense, which may obscure small masses. FINDINGS: Postsurgical changes in the posterior upper outer right breast are stable from the most recent prior exam. There are no other areas of architectural distortion. There is a stable benign fibroadenoma in the medial right breast with associated dystrophic calcifications. There are no new or suspicious masses. There are no suspicious calcifications. Skin along the medial aspect the right breast is thickened which has increased from prior studies. Mammographic images were processed with CAD. On physical exam, there is thickening and dimpling of the skin over the medial aspect of the right breast, with no underlying discrete palpable mass. Targeted ultrasound is performed, showing thickened skin, but no underlying mass or distortion. No suspicious lesions. The stable benign fibroadenoma is incidentally noted. IMPRESSION: 1. Patient has developed an area of skin thickening and dimpling along the medial aspect of the right breast. The etiology of this is unclear. It could potentially reflect an area of inflammatory breast carcinoma. Surgical consultation for consideration of punch biopsy is recommended. RECOMMENDATION: 1. Surgical consultation based on the clinical findings of skin thickening and dimpling along the medial right breast. 2. Imaging findings show benign postsurgical changes in the upper outer right breast and skin thickening but no suspicious mass or calcifications. I have discussed the findings and recommendations with the patient. Results were also provided in writing at the conclusion of the visit. If applicable, a reminder letter will be sent to the patient regarding the next appointment. BI-RADS  CATEGORY  4: Suspicious. Electronically Signed  By: Lajean Manes M.D.   On: 05/07/2015 14:42   Mm Diag Breast Tomo Uni Right  05/07/2015  CLINICAL DATA:  Patient presents with a new area of fullness and tightness along the medial right breast. Patient had a lumpectomy in the right breast upper outer quadrant posteriorly performed in July 2015 with adjuvant radiation therapy. The current area of concern is new. EXAM: 2D DIGITAL DIAGNOSTIC RIGHT MAMMOGRAM WITH CAD AND ADJUNCT TOMO ULTRASOUND RIGHT BREAST COMPARISON:  Previous exam(s). ACR Breast Density Category c: The breast tissue is heterogeneously dense, which may obscure small masses. FINDINGS: Postsurgical changes in the posterior upper outer right breast are stable from the most recent prior exam. There are no other areas of architectural distortion. There is a stable benign fibroadenoma in the medial right breast with associated dystrophic calcifications. There are no new or suspicious masses. There are no suspicious calcifications. Skin along the medial aspect the right breast is thickened which has increased from prior studies. Mammographic images were processed with CAD. On physical exam, there is thickening and dimpling of the skin over the medial aspect of the right breast, with no underlying discrete palpable mass. Targeted ultrasound is performed, showing thickened skin, but no underlying mass or distortion. No suspicious lesions. The stable benign fibroadenoma is incidentally noted. IMPRESSION: 1. Patient has developed an area of skin thickening and dimpling along the medial aspect of the right breast. The etiology of this is unclear. It could potentially reflect an area of inflammatory breast carcinoma. Surgical consultation for consideration of punch biopsy is recommended. RECOMMENDATION: 1. Surgical consultation based on the clinical findings of skin thickening and dimpling along the medial right breast. 2. Imaging findings show benign  postsurgical changes in the upper outer right breast and skin thickening but no suspicious mass or calcifications. I have discussed the findings and recommendations with the patient. Results were also provided in writing at the conclusion of the visit. If applicable, a reminder letter will be sent to the patient regarding the next appointment. BI-RADS CATEGORY  4: Suspicious. Electronically Signed   By: Lajean Manes M.D.   On: 05/07/2015 14:42     ASSESSMENT: 40 y.o. BRCA negative [place] woman status post right breast biopsy 07/17/2013 for high-grade ductal carcinoma in situ, estrogen and progesterone receptor positive  (1) status post right lumpectomy and sentinel lymph node sampling 08/08/2013 for a pT1b pN0, stage IA invasive ductal carcinoma, grade 3, estrogen and progesterone receptor positive, with an MIB-1 of 62% and no HER-2 amplification  (2) genetics sent 08/02/2013 was normal but did identify a variant of uncertain significance called BRCA2, [id number].  (3) Oncotype score of 23 predicts a risk of outside the breast recurrence within 10 years of 15% if the patient's only systemic treatment is tamoxifen for 5 years. Adjuvant chemotherapy was recommended  (4) doxorubicin and cyclophosphamide in dose dense fashion x4 completed 11/16/2013, followed by paclitaxel weekly x12 completed 03/08/2014  (5) adjuvant radiation 3/9/206-05/21/2014:  Right breast / 45 Gray @ 1.8 [place]line Cables per fraction x 25 fractions Right breast boost / 16 Gray at [employer] per fraction x 8 fractions  (6) tamoxifen started 06/20/2014   PLAN: Jennifer Frazier is  Jennifer Frazier anxious because of the changes she feels in the right breast. To me they look entirely typical of radiation change. I would not have proceeded beyond the radiologic study, which showed no evidence of a mass, but since biopsy was suggested I have asked Dr. Brantley Stage if possible to move it up so  she doesn't have to wait until next month to get a definite result on this. I  feel certain this is going to be benign however.  Her blood pressure was initially high today. It  Came down after we discussed some of these issues. However she is off her Hyder diarrheal and I went ahead and refilled that for her.  I don't see any need to go back on Wellbutrin, since it apparently was not helping, but I do think we should increase the venlafaxine to 75 mg daily as she is having still significant hot flashes problems. I also refill her gabapentin, which does help her with the nighttime hot flashes. Manson Allan she has no neuropathy, which is very favorable, but she does have some darkening still of the left big toenail. I don't really know why that would persist when all the other nails pretty much cleared. I think it would be helpful if she saw a foot doctor.   I don't have a simple solution for the weight problem area if she completely eliminated carbohydrates, that would help as she would be non-carbohydrate vegetables and meet. She is hungry all the time however. She is getting discouraged that the exercise she is doing is not helping. I strongly urged her to continue the exercise program whatever else happens to her weight, as is that can only help we did discuss bypass surgery but at this point she once is doing "natural".   she will see [name] again in 3 months. She knows to call for any problems that may develop before her next visit here  Chauncey Cruel, MD   05/08/2015 9:43 AM

## 2015-05-08 NOTE — Telephone Encounter (Signed)
appt made and avs printed °

## 2015-05-09 LAB — FOLLICLE STIMULATING HORMONE: FSH: 65.2 m[IU]/mL

## 2015-05-13 ENCOUNTER — Other Ambulatory Visit: Payer: Self-pay | Admitting: Surgery

## 2015-05-14 LAB — ESTRADIOL, ULTRA SENS: ESTRADIOL, SENSITIVE: 14.4 pg/mL

## 2015-05-20 ENCOUNTER — Encounter: Payer: Self-pay | Admitting: Oncology

## 2015-06-04 ENCOUNTER — Other Ambulatory Visit: Payer: Self-pay | Admitting: Nurse Practitioner

## 2015-06-05 ENCOUNTER — Encounter: Payer: Self-pay | Admitting: *Deleted

## 2015-06-05 ENCOUNTER — Telehealth: Payer: Self-pay | Admitting: *Deleted

## 2015-06-05 NOTE — Telephone Encounter (Signed)
"  I need a letter for [employer].  I have a letter dated May 16, 2015 about returning to work but It does not include the date I returned to work.  Could you fax a new letter to Debbora Dus, fax [phone] that I returned to work, full time with no restrictions on November 26, 2014.  I really appreciate it if this could be done by the end of this week."

## 2015-06-26 ENCOUNTER — Encounter: Payer: Self-pay | Admitting: *Deleted

## 2015-06-26 ENCOUNTER — Encounter: Payer: Self-pay | Admitting: Adult Health

## 2015-06-26 NOTE — Progress Notes (Signed)
A birthday card was mailed to the patient today on behalf of the Survivorship Program at [facility].   Gretchen Dawson, NP Survivorship Program [facility] [phone]  

## 2015-08-06 ENCOUNTER — Other Ambulatory Visit: Payer: Self-pay | Admitting: *Deleted

## 2015-08-06 DIAGNOSIS — C50411 Malignant neoplasm of upper-outer quadrant of right female breast: Secondary | ICD-10-CM

## 2015-08-06 NOTE — Progress Notes (Signed)
CLINIC:  Cancer Survivorship   REASON FOR VISIT:  Routine follow-up post-treatment for history of breast cancer.  BRIEF ONCOLOGIC HISTORY:    Breast cancer of upper-outer quadrant of right female breast ([place])   07/17/2013 Initial Diagnosis Breast cancer of upper-outer quadrant of right female breast   07/17/2013 Initial Biopsy Right breast needle core biopsy: Grade 3, DCIS with comedo-type necrosis.  ER+ (100%), PR+ (96%).    07/25/2013 Breast MRI Right breast ([facility]): 3 cm clumped nodular NME with associated clip artifact, corresponds with recently diagnosed DCIS. Marked bilateral parenchymal enhancing foci. No evidence of adenopathy. No worrisome foci in left breast.    08/02/2013 Procedure Genetic counseling/testing: Revealed 1 VUS on BRCA2 gene: p.E3309K (c.9925G>A).  Otherwise, 24 gene panel with [facility] was (-).    08/08/2013 Oncotype testing Recurrence score: 23 (15% ROR). Adjuvant chemotherapy planned ([place])   08/08/2013 Definitive Surgery Right breast lumpectomy with SLNB ([place]): Grade 3 IDC, spanning 0.7 cm.  Also grade 3 DCIS. Negative margins.  2 right axillary SLN removed and both were benign. ER+ (86%), PR+ (89), HER2- (ratio 1.07). Ki67 62%.    08/08/2013 Pathologic Stage pT1b, pN0: Stage IA   09/26/2013 Echocardiogram Pre-chemo EF: 55-60%.    10/05/2013 - 11/16/2013 Adjuvant Chemotherapy Adriamycin & Cytoxan x 4 cycles completed. ([facility])   10/10/2013 Imaging CT c/a/p: No axillary lymphadenopathy. No evidence of mets in the chest, abdomen, or pelvis.    11/30/2013 - 03/08/2014 Adjuvant Chemotherapy Taxol x 12 cycles completed. ([place])   04/04/2014 - 05/21/2014 Radiation Therapy Adjuvant RT completed Pablo Ledger).  Right breast: Total dose 45 Gy over 25 fractions. Right breast boost: Total dose 16 Gy over 8 fractions.     Anti-estrogen oral therapy Planned duration of anti-estrogen therapy is 5-10 years. Pt will start with Tamoxifen in 07/2014 and may transition to aromatase  inhibitor in 2-3 years ([place]).    06/21/2014 Survivorship Survivorship Care Plan given to patient and reviewed with her in person.     INTERVAL HISTORY:  Jennifer Frazier presents to the Survivorship Clinic today for ongoing follow up regarding her history of breast cancer. Overall, Jennifer Frazier reports feeling doing well since her last visit with Dr. Jana Hakim 3 months ago.  She underwent punch biopsy of the right breast in April 2017, which was benign.  She continues on tamoxifen and is tolerating this fairly well with bearable hot flashes.  She saw her integrative health doctor last month who ordered some lab work showng her estradiol to be 31 where it was 124.6 at [facility] in March 2017. She is not mestruating.  She reports the development of arthralgias and joint stiffness over the last few months.  She notes this to be worse when sitting for a long period of time or if sitting low to the ground.  She has not noticed any change within her breast and her last mammogram was in September 2016 prior to the findings leading to the punch biopsy above and was unremarkable. She denies any headache, cough, shortness of breath, or bone pain.  She does . She reports a good appetite and denies any weight loss.  Her nail changes in her left foot have continued and she is interested in seeing a dermatologist.  She also reports an area along her left antecubital space that is intermittently red and itchy.  It is not present today.  REVIEW OF SYSTEMS:  General: Denies fever, chills, unintentional weight loss, or generalized fatigue.  HEENT: Denies visual changes, hearing loss,  mouth sores, or difficulty swallowing. Cardiac: Denies palpitations and lower extremity edema.  Respiratory: Denies wheeze or dyspnea on exertion.  Breast: As above. GI: Denies abdominal pain, constipation, diarrhea, nausea, or vomiting.  GU: Denies dysuria, hematuria, vaginal bleeding, vaginal discharge, or vaginal dryness.  Musculoskeletal: As  above. Neuro: Denies recent fall or numbness / tingling in her extremities.  Skin: Denies rash, pruritis, or open wounds.  Psych: Denies depression, anxiety, insomnia, or memory loss.   A 14-point review of systems was completed and was negative, except as noted above.   ONCOLOGY TREATMENT TEAM:  1. Surgeon:  Dr. Brantley Stage at [facility]  2. Medical Oncologist: Dr. Jana Hakim 3. Radiation Oncologist: Dr. Pablo Ledger    PAST MEDICAL/SURGICAL HISTORY:  Past Medical History  Diagnosis Date  . Fibroids     uterine fibroids  . Anemia   . Thyroid disease   . Contact lens/glasses fitting     wears contacts or glasses  . Sleep apnea     has a cpap-does not always use it  . Cancer ([place])     Breast  . Radiation 04/04/14-05/21/14    Right upper-outer breast  . Hypertension   . Hypothyroidism   . Depression   . Anxiety    Past Surgical History  Procedure Laterality Date  . [place]  2012    urerine ablasion  . Eye surgery  2013    retinal tear-lazer-both  . Wisdom tooth extraction    . Breast surgery      lumpectomy 7/14  . Portacath placement Right 09/29/2013    Procedure: INSERTION PORT-A-CATH WITH ULTRA SOUND;  Surgeon: [name], MD;  Location: [place];  Service: General;  Laterality: Right;  . Port a cath revision N/A 12/19/2013    Procedure: REPOSITION PORT;  Surgeon: [name], MD;  Location: [place];  Service: General;  Laterality: N/A;  . Port-a-cath removal Right 08/08/2014    Procedure: REMOVAL PORT-A-CATH;  Surgeon: [name], MD;  Location: [place];  Service: General;  Laterality: Right;     ALLERGIES:  Allergies  Allergen Reactions  . Gadolinium      Desc: NAUSEA WITH MRI CONTRAST-NO VOMITING   . Tramadol Hives and Nausea Only     CURRENT MEDICATIONS:  Current Outpatient Prescriptions on File Prior to Visit  Medication Sig Dispense Refill  . diphenhydrAMINE (BENADRYL) 25 mg capsule Take  25 mg by mouth every 6 (six) hours as needed.    . diphenhydramine-acetaminophen (TYLENOL PM) 25-500 MG TABS Take 1 tablet by mouth at bedtime as needed (pain/headache).    . hydrochlorothiazide (HYDRODIURIL) 25 MG tablet Take 1 tablet (25 mg total) by mouth daily. 30 tablet 3  . tamoxifen (NOLVADEX) 20 MG tablet Take 1 tablet (20 mg total) by mouth daily. 90 tablet 4  . venlafaxine XR (EFFEXOR-XR) 75 MG 24 hr capsule Take 1 capsule (75 mg total) by mouth daily with breakfast. 90 capsule 4  . Cholecalciferol (VITAMIN D3) 3000 UNITS TABS Take 10,000 Units by mouth daily. Reported on 08/07/2015     No current facility-administered medications on file prior to visit.     ONCOLOGIC FAMILY HISTORY:  Family History  Problem Relation Age of Onset  . Cancer Father 48    colon  . Cancer Maternal Aunt 50    breast cancer  . Cancer Paternal Aunt 17    breast cancer  . Cancer Maternal Grandmother 72    ovarian or uterine cancer  .  Cancer Paternal Grandmother     unknown primary     GENETIC COUNSELING/TESTING: Yes, performed 08/02/2013: Genetic counseling/testing: Revealed 1 VUS on BRCA2 gene: p.E3309K (c.9925G>A).  Otherwise, 24 gene panel with [facility] was negative.  SOCIAL HISTORY:  Jennifer Frazier is single and lives with her mother, who is disabled,  in [place], [place].  She has no children. 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  She denies any current or history of tobacco, alcohol, or illicit drug use.   :  PHYSICAL EXAMINATION:  Vital Signs: Filed Vitals:   08/07/15 1343  BP: 129/75  Pulse: 78  Temp: 98.3 F (36.8 C)  Resp: 18   Weight: 266 ECOG performance status: 0 General: Well-nourished, well-appearing female in no acute distress.  She is accompanied in clinic by her  Mother today.   HEENT: Head is atraumatic and normocephalic.  Pupils equal and reactive to light and accomodation. Conjunctivae clear without exudate.  Sclerae  anicteric. Oral mucosa is pink, moist, and intact without lesions.  Oropharynx is pink without lesions or erythema.  Lymph: No cervical, supraclavicular, infraclavicular, or axillary lymphadenopathy noted on palpation.  Cardiovascular: Regular rate and rhythm without murmurs, rubs, or gallops. Respiratory: Clear to auscultation bilaterally. Chest expansion symmetric without accessory muscle use on inspiration or expiration.  Breast: Bilateral breast exam performed.  Right lumpectomy scar intact with tenderness to palpation, thickening about scar.  No mass or nodule in either breast. GI: Abdomen soft and round. No tenderness to palpation. Bowel sounds normoactive in 4 quadrants. No hepatosplenomegaly.   GU: Deferred.  Musculoskeletal: Muscle strength 5/5 in all extremities.   Neuro: No focal deficits. Steady gait.  Psych: Mood and affect normal and appropriate for situation.  Extremities: No edema, cyanosis, or clubbing.  Skin: Warm and dry. No open lesions noted. Darkened and thickened left great toe nail and darkening along 2nd and 5th toe along nail.  LABORATORY DATA:  Recent Results (from the past 2160 hour(s))  CBC with Differential     Status: None   Collection Time: 08/07/15  1:06 PM  Result Value Ref Range   WBC 5.6 3.9 - 10.3 10e3/uL   NEUT# 3.6 1.5 - 6.5 10e3/uL   HGB 14.5 11.6 - 15.9 g/dL   HCT 44.6 34.8 - 46.6 %   Platelets 206 145 - 400 10e3/uL   MCV 84.7 79.5 - 101.0 fL   MCH 27.6 25.1 - 34.0 pg   MCHC 32.5 31.5 - 36.0 g/dL   RBC 5.26 3.70 - 5.45 10e6/uL   RDW 13.4 11.2 - 14.5 %   lymph# 1.6 0.9 - 3.3 10e3/uL   MONO# 0.3 0.1 - 0.9 10e3/uL   Eosinophils Absolute 0.1 0.0 - 0.5 10e3/uL   Basophils Absolute 0.0 0.0 - 0.1 10e3/uL   NEUT% 64.4 38.4 - 76.8 %   LYMPH% 29.0 14.0 - 49.7 %   MONO% 4.8 0.0 - 14.0 %   EOS% 1.4 0.0 - 7.0 %   BASO% 0.4 0.0 - 2.0 %  Comprehensive metabolic panel     Status: Abnormal   Collection Time: 08/07/15  1:06 PM  Result Value Ref Range    Sodium 138 136 - 145 mEq/L   Potassium 3.5 3.5 - 5.1 mEq/L   Chloride 101 98 - 109 mEq/L   CO2 26 22 - 29 mEq/L   Glucose 84 70 - 140 mg/dl    Comment: Glucose reference range is for nonfasting patients. Fasting glucose reference range is  70- 100.   BUN 16.2 7.0 - 26.0 mg/dL   Creatinine 0.9 0.6 - 1.1 mg/dL   Total Bilirubin 0.37 0.20 - 1.20 mg/dL   Alkaline Phosphatase 75 40 - 150 U/L   AST 23 5 - 34 U/L   ALT 15 0 - 55 U/L   Total Protein 8.1 6.4 - 8.3 g/dL   Albumin 4.0 3.5 - 5.0 g/dL   Calcium 10.7 (H) 8.4 - 10.4 mg/dL   Anion Gap 11 3 - 11 mEq/L   EGFR >90 >90 ml/min/1.73 m2    Comment: eGFR is calculated using the CKD-EPI Creatinine Equation (2009)      ASSESSMENT AND PLAN:   1. Breast cancer: Stage IA invasive ductal carcinoma with ductal carcinoma in situ of the right breast (06/2013), ER positive, PR positive, HER2/neu negative, S/P right lumpectomy (07/2013) S/P adjuvant chemotherapy with doxorubicin and cyclophosphamide x 4 cycles followed by paclitaxel x 12 (completed 02/2014) followed by adjuvant radiation therapy to the right breast (completed 04/2014) with adjuvant endocrine therapy with tamoxifen initiated 07/2014 with plans for 5-10 years of therapy.  Jennifer Frazier is doing well with no clinical symptoms worrisome for cancer recurrence at this time. I have reviewed the recommendations for ongoing surveillance with her and she will follow-up with her medical oncologist,  Dr. Jana Hakim, in October 2017 with history and physical exam per surveillance protocol.  She will be due bilateral mammogram in September 2017 and we will enter orders for that to be scheduled following today's appointment.  She will continue her anti-estrogen therapy with tamoxifen at this time and was instructed to make [name] aware if she notes any change within her breast, any new symptoms such as pain, shortness of breath, weight loss, or fatigue. She is experiencing some arthralgias and joint stiffness, as above,  that could be related to her tamoxifen.  We have discussed this at length today. She will begin a trial of glucosamine chondrotin and continue her exercise / yoga to see if these helps.   She will consider whether she wants to undergo a tamoxifen holiday to see if this helps with her symptoms and let Dr. Jana Hakim know.  We discussed the potential of her undergoing a change to anastrozole, however, I cautioned her that most people have more arthrlagias with AIs than tamoxifen.  Her recent [facility] levels suggest that she is post menopausal, however, her estradiol is still elevated.  We will check this at her next visit with Dr. Jana Hakim in October.  She will also report any new or increased side effects of the endocrine therapy or any difficulties with it.   Though the incidence is low, there is an associated risk of endometrial cancer with anti-estrogen therapies like Tamoxifen.  Jennifer Frazier was encouraged to contact Dr. Julaine Fusi any vaginal bleeding while taking Tamoxifen. Other side effects of Tamoxifen were again reviewed with her as well.  2. Hypercalcemia: Jennifer Frazier's calcium level is slightly elevated today.  She denies any intake of calcium supplements and does not report high intake of calcium containing foods.  I have asked her to return in two weeks' time to have this rechecked.  She does have a history of back pain, which sounds more muscular in nature.  Her "locking up" complaints are generalized throughout her lower extremities and do not sound consistent with pain that would be expected with metastatic disease.  She will monitor these and further workup pending results on repeat calcium.  3. Nail changes: We will make referral  to dermatology.    4. Cancer screening:  Due to Jennifer Frazier history and her age, she should receive screening for skin cancers, colon cancer, and gynecologic cancers.  The information and recommendations were shared with the patient and in her written after visit  summary.  5. Health maintenance and wellness promotion:Jennifer Frazier and I discussed recommendations to maximize nutrition and minimize recurrence, such as increased intake of fruits, vegetables, lean proteins, and minimizing the intake of red meats and processed foods.  She was also encouraged to engage in moderate to vigorous exercise for 30 minutes per day most days of the week. She was instructed to limit her alcohol consumption and continue to abstain from tobacco use.    6. Support services/counseling:  Jennifer Frazier was offered support today through active listening and expressive supportive counseling.    A total of 40 minutes of face-to-face time was spent with this patient with greater than 50% of that time in counseling and care-coordination.   Sylvan Cheese, NP  Survivorship Program [facility] [phone]   Note: PRIMARY CARE PROVIDER Sharilyn Sites [place], [place] [phone]

## 2015-08-06 NOTE — Patient Instructions (Addendum)
Thank you for coming in today!  As we discussed, please continue to perform your self breast exam and report any changes. If you note any new symptoms (please see below), be sure to notify us ASAP.  We will check your calcium in 2 weeks and call you with the result.  Please let us know if the glucosamine / chondroitin doesn't help with the joint stiffness.  Your mammogram will be due in September 2017 and we will enter orders for it today.  We'll have you return in three months' time for your next appointment with Dr. Jana Hakim or sooner if you have any problems. Please be sure to stop by scheduling on your way out to make those appointment(s).  Looking forward to working with you in the future!  Let us know if you have any questions!  Symptoms to Watch for and Report to Your Provider  . Return of the cancer symptoms you had before- such as a lump or new growth where your cancer first started . New or unusual pain that seems unrelated to an injury and does not go away, including back pain or bone pain . Weight loss without trying/intending . Unexplained bleeding . A rash or allergic reaction, such as swelling, severe itching or wheezing . Chills or fevers . Persistent headaches . Shortness of breath or difficulty breathing . Bloody stools or blood in your urine . Lumps, bumps, swelling and/or nipple discharge . Nausea, vomiting, diarrhea, loss of appetite, or trouble swallowing . A cough that doesn't go away . Abdominal pain . Swelling in your arms or legs . Fractures . Hot flashes or other menopausal symptoms . Any other signs mentioned by your doctor or nurse or any unusual symptoms                 that you just can't explain   NOTE: Just because you have certain symptoms, it doesn't mean the cancer has come back or you have a new cancer. Symptoms can be due to other problems that need to be addressed.  It is important to watch for these symptoms and report them to your provider so you can be  medically evaluated for any of these concerns!     Living a Life of Wellness After Cancer:  *Note: Please consult your health care provider before using any medications, supplements, over-the-counter products, or other interventions.  Also, please consult your primary care provider before you begin any lifestyle program (diet, exercise, etc.).  Your safety is our top priority and we want to make sure you continue to live a long and healthy life!    Healthy Lifestyle Recommendations  As a cancer survivor, it is important develop a lifelong commitment to a healthy lifestyle. A healthy lifestyle can prevent cancer from returning as well as prevent other diseases like heart disease, diabetes and high blood pressure.  These are some things that you can do to have a healthy lifestyle:  [name] Maintain a healthy weight.  . Exercise daily per your doctor's orders. . Eat a balanced diet high in fruits, vegetables, bran, and fiber. Limit intake of red meat      and processed foods.  . Limit how much alcohol you consume, if at all. Ali Lowe regular bone mineral density testing for osteoporosis.  . Talk to your doctor about cardiovascular disease or "heart disease" screening. . Stop smoking (if you smoke). . Know your family history. . Be mindful of your emotional, social, and spiritual needs. . Meet  regularly with a Primary Care Provider (PCP). Find a PCP if you do not             already have one. . Talk to your doctor about regular cancer screening including screening for colon           cancer, GYN cancers, and skin cancer.

## 2015-08-07 ENCOUNTER — Other Ambulatory Visit (HOSPITAL_BASED_OUTPATIENT_CLINIC_OR_DEPARTMENT_OTHER): Payer: 59

## 2015-08-07 ENCOUNTER — Ambulatory Visit (HOSPITAL_BASED_OUTPATIENT_CLINIC_OR_DEPARTMENT_OTHER): Payer: 59 | Admitting: Nurse Practitioner

## 2015-08-07 ENCOUNTER — Other Ambulatory Visit: Payer: Self-pay | Admitting: Nurse Practitioner

## 2015-08-07 ENCOUNTER — Encounter: Payer: Self-pay | Admitting: Nurse Practitioner

## 2015-08-07 ENCOUNTER — Telehealth: Payer: Self-pay | Admitting: Nurse Practitioner

## 2015-08-07 VITALS — BP 129/75 | HR 78 | Temp 98.3°F | Resp 18 | Ht 69.0 in | Wt 266.9 lb

## 2015-08-07 DIAGNOSIS — E28319 Asymptomatic premature menopause: Secondary | ICD-10-CM

## 2015-08-07 DIAGNOSIS — C50411 Malignant neoplasm of upper-outer quadrant of right female breast: Secondary | ICD-10-CM

## 2015-08-07 DIAGNOSIS — Z170 Estrogen receptor positive status [ER+]: Secondary | ICD-10-CM | POA: Diagnosis not present

## 2015-08-07 DIAGNOSIS — L609 Nail disorder, unspecified: Secondary | ICD-10-CM

## 2015-08-07 DIAGNOSIS — Z79810 Long term (current) use of selective estrogen receptor modulators (SERMs): Secondary | ICD-10-CM

## 2015-08-07 LAB — CBC WITH DIFFERENTIAL/PLATELET
BASO%: 0.4 % (ref 0.0–2.0)
BASOS ABS: 0 10*3/uL (ref 0.0–0.1)
EOS%: 1.4 % (ref 0.0–7.0)
Eosinophils Absolute: 0.1 10*3/uL (ref 0.0–0.5)
HCT: 44.6 % (ref 34.8–46.6)
HGB: 14.5 g/dL (ref 11.6–15.9)
LYMPH%: 29 % (ref 14.0–49.7)
MCH: 27.6 pg (ref 25.1–34.0)
MCHC: 32.5 g/dL (ref 31.5–36.0)
MCV: 84.7 fL (ref 79.5–101.0)
MONO#: 0.3 10*3/uL (ref 0.1–0.9)
MONO%: 4.8 % (ref 0.0–14.0)
NEUT#: 3.6 10*3/uL (ref 1.5–6.5)
NEUT%: 64.4 % (ref 38.4–76.8)
Platelets: 206 10*3/uL (ref 145–400)
RBC: 5.26 10*6/uL (ref 3.70–5.45)
RDW: 13.4 % (ref 11.2–14.5)
WBC: 5.6 10*3/uL (ref 3.9–10.3)
lymph#: 1.6 10*3/uL (ref 0.9–3.3)

## 2015-08-07 LAB — COMPREHENSIVE METABOLIC PANEL
ALT: 15 U/L (ref 0–55)
AST: 23 U/L (ref 5–34)
Albumin: 4 g/dL (ref 3.5–5.0)
Alkaline Phosphatase: 75 U/L (ref 40–150)
Anion Gap: 11 mEq/L (ref 3–11)
BUN: 16.2 mg/dL (ref 7.0–26.0)
CALCIUM: 10.7 mg/dL — AB (ref 8.4–10.4)
CHLORIDE: 101 meq/L (ref 98–109)
CO2: 26 meq/L (ref 22–29)
Creatinine: 0.9 mg/dL (ref 0.6–1.1)
EGFR: >90 mL/min/{1.73_m2} (ref >90)
GLUCOSE: 84 mg/dL (ref 70–140)
POTASSIUM: 3.5 meq/L (ref 3.5–5.1)
SODIUM: 138 meq/L (ref 136–145)
Total Bilirubin: 0.37 mg/dL (ref 0.20–1.20)
Total Protein: 8.1 g/dL (ref 6.4–8.3)

## 2015-08-07 NOTE — Telephone Encounter (Signed)
appt made and avs printed °

## 2015-08-28 ENCOUNTER — Other Ambulatory Visit (HOSPITAL_BASED_OUTPATIENT_CLINIC_OR_DEPARTMENT_OTHER): Payer: 59

## 2015-08-28 DIAGNOSIS — C50411 Malignant neoplasm of upper-outer quadrant of right female breast: Secondary | ICD-10-CM

## 2015-08-29 ENCOUNTER — Telehealth: Payer: Self-pay | Admitting: *Deleted

## 2015-08-29 LAB — CALCIUM, IONIZED: Calcium, Ionized, Serum: 5.4 mg/dL (ref 4.5–5.6)

## 2015-08-29 NOTE — Telephone Encounter (Signed)
Called pt to inform her that her results -  ionized calcium - were normal 5.4 per NP. Pt were pleased to know they were WNL. Pt had no further concerns. Message to be fwd to Mike Craze, NP.

## 2015-09-17 ENCOUNTER — Other Ambulatory Visit: Payer: Self-pay | Admitting: Oncology

## 2015-10-01 ENCOUNTER — Ambulatory Visit
Admission: RE | Admit: 2015-10-01 | Discharge: 2015-10-01 | Disposition: A | Discharge status: Home / Self Care | Payer: 59 | Source: Ambulatory Visit | Attending: Nurse Practitioner | Admitting: Nurse Practitioner

## 2015-10-01 DIAGNOSIS — C50411 Malignant neoplasm of upper-outer quadrant of right female breast: Secondary | ICD-10-CM

## 2015-10-28 ENCOUNTER — Other Ambulatory Visit: Payer: Self-pay | Admitting: Oncology

## 2015-11-04 ENCOUNTER — Other Ambulatory Visit: Payer: Self-pay | Admitting: *Deleted

## 2015-11-04 DIAGNOSIS — C50411 Malignant neoplasm of upper-outer quadrant of right female breast: Secondary | ICD-10-CM

## 2015-11-05 ENCOUNTER — Ambulatory Visit (HOSPITAL_BASED_OUTPATIENT_CLINIC_OR_DEPARTMENT_OTHER): Payer: 59 | Admitting: Oncology

## 2015-11-05 ENCOUNTER — Other Ambulatory Visit (HOSPITAL_BASED_OUTPATIENT_CLINIC_OR_DEPARTMENT_OTHER): Payer: 59

## 2015-11-05 VITALS — BP 134/93 | HR 75 | Temp 98.5°F | Resp 18 | Ht 69.0 in | Wt 265.0 lb

## 2015-11-05 DIAGNOSIS — C50411 Malignant neoplasm of upper-outer quadrant of right female breast: Secondary | ICD-10-CM | POA: Diagnosis not present

## 2015-11-05 DIAGNOSIS — Z170 Estrogen receptor positive status [ER+]: Secondary | ICD-10-CM | POA: Diagnosis not present

## 2015-11-05 DIAGNOSIS — D0511 Intraductal carcinoma in situ of right breast: Secondary | ICD-10-CM

## 2015-11-05 LAB — COMPREHENSIVE METABOLIC PANEL
ALBUMIN: 3.6 g/dL (ref 3.5–5.0)
ALK PHOS: 83 U/L (ref 40–150)
ALT: 13 U/L (ref 0–55)
ANION GAP: 9 meq/L (ref 3–11)
AST: 20 U/L (ref 5–34)
BILIRUBIN TOTAL: 0.36 mg/dL (ref 0.20–1.20)
BUN: 13.3 mg/dL (ref 7.0–26.0)
CALCIUM: 10 mg/dL (ref 8.4–10.4)
CHLORIDE: 105 meq/L (ref 98–109)
CO2: 26 mEq/L (ref 22–29)
CREATININE: 0.8 mg/dL (ref 0.6–1.1)
EGFR: >90 mL/min/{1.73_m2} (ref >90)
Glucose: 84 mg/dl (ref 70–140)
Potassium: 3.6 mEq/L (ref 3.5–5.1)
Sodium: 140 mEq/L (ref 136–145)
TOTAL PROTEIN: 7.5 g/dL (ref 6.4–8.3)

## 2015-11-05 LAB — CBC WITH DIFFERENTIAL/PLATELET
BASO%: 0.3 % (ref 0.0–2.0)
Basophils Absolute: 0 10*3/uL (ref 0.0–0.1)
EOS%: 1.2 % (ref 0.0–7.0)
Eosinophils Absolute: 0.1 10*3/uL (ref 0.0–0.5)
HCT: 42 % (ref 34.8–46.6)
HEMOGLOBIN: 13.6 g/dL (ref 11.6–15.9)
LYMPH#: 1.8 10*3/uL (ref 0.9–3.3)
LYMPH%: 34.5 % (ref 14.0–49.7)
MCH: 27.8 pg (ref 25.1–34.0)
MCHC: 32.5 g/dL (ref 31.5–36.0)
MCV: 85.7 fL (ref 79.5–101.0)
MONO#: 0.3 10*3/uL (ref 0.1–0.9)
MONO%: 5.4 % (ref 0.0–14.0)
NEUT#: 3.1 10*3/uL (ref 1.5–6.5)
NEUT%: 58.6 % (ref 38.4–76.8)
PLATELETS: 185 10*3/uL (ref 145–400)
RBC: 4.9 10*6/uL (ref 3.70–5.45)
RDW: 13.9 % (ref 11.2–14.5)
WBC: 5.3 10*3/uL (ref 3.9–10.3)

## 2015-11-05 MED ORDER — VENLAFAXINE HCL ER 75 MG PO CP24: 75.0000 mg | ORAL_CAPSULE | Freq: Every day | ORAL | 4 refills | Status: DC

## 2015-11-05 MED ORDER — TAMOXIFEN CITRATE 20 MG PO TABS: 20.0000 mg | ORAL_TABLET | Freq: Every day | ORAL | 4 refills | Status: DC

## 2015-11-05 NOTE — Progress Notes (Signed)
[place]  [phone] [phone]     ID: Jennifer Frazier DOB: 12-Jan-1976  MR#: [id number]  FKC#:[id number]  Patient Care Team: Sharilyn Sites, MD as PCP - General (Family Medicine) Chauncey Cruel, MD as Consulting Physician (Oncology) Thea Silversmith, MD as Consulting Physician (Radiation Oncology) [name], MD as Consulting Physician (General Surgery) OTHER MD: Legrand Como Altheimer MD  CHIEF COMPLAINT: Estrogen receptor positive breast cancer  CURRENT TREATMENT: Tamoxifen   BREAST CANCER HISTORY: From the original intake note:  The patient had screening mammography at [facility] showing calcifications in the right breast, and was referred to the breast Center 07/06/2013 for additional views. Right diagnostic mammography confirmed a group of amorphous calcifications in the upper outer right breast measuring 1.2 cm.  Biopsy of this area 07/17/2013 showed ([id number]) ductal carcinoma in situ, high-grade, estrogen receptor 100% positive, and progesterone receptor 96% positive, both with strong staining intensity.  The patient was then referred to surgery and on 07/25/2013 underwent bilateral breast MRI. This showed a breast density category CEA. In the right breast there was an area of clumped nodular enhancement measuring 3 cm. There was also a 1.5 cm circumscribed oval mass in the medial portion of the right breast consistent with a fibroadenoma. The left breast was unremarkable, and there were no abnormal appearing lymph nodes.  On 08/08/2013 the patient underwent right lumpectomy and sentinel lymph node sampling. The pathology ([id number] was (showed, in addition to ductal carcinoma in situ, invasive ductal carcinoma, measuring 0.7 cm, grade 3, estrogen receptor 86% positive, progesterone receptor 89% positive, both with strong staining intensity, with an MIB-1 of 62%, and no HER-2 amplification. Both sentinel lymph nodes were clear.  Margins were ample.   The patient's subsequent history is as detailed below  INTERVAL HISTORY: Jennifer Frazier returns today for follow up of her breast cancer. She continues on tamoxifen, which she generally tolerates well. Hot flashes and vaginal wetness are not major concerns. She obtains it at a good price.   REVIEW OF SYSTEMS: Jennifer Frazier is very busy at her job, which she greatly enjoys. She is making sure to have time for herself. She has been on a vegan diet for about 2 months. She finds it makes her little sleepy but otherwise she likes it. She has not had a period now for nearly a year. She is wondering if she should adopt a child and she would like to begin to explore the possibility. She had problems with her toenails which were evaluated by dermatology. She did not require any medication. She exercises currently by going to a [address] offered twice a week down time [place]. A detailed review of systems today was otherwise stable  PAST MEDICAL HISTORY: Past Medical History:  Diagnosis Date  . Anemia   . Anxiety   . Cancer ([place])    Breast  . Contact lens/glasses fitting    wears contacts or glasses  . Depression   . Fibroids    uterine fibroids  . Hypertension   . Hypothyroidism   . Radiation 04/04/14-05/21/14   Right upper-outer breast  . Sleep apnea    has a cpap-does not always use it  . Thyroid disease     PAST SURGICAL HISTORY: Past Surgical History:  Procedure Laterality Date  . BREAST SURGERY     lumpectomy 7/14  . EYE SURGERY  2013   retinal tear-lazer-both  . PORT A CATH REVISION N/A 12/19/2013   Procedure: REPOSITION PORT;  Surgeon:  [name], MD;  Location: [facility];  Service: General;  Laterality: N/A;  . PORT-A-CATH REMOVAL Right 08/08/2014   Procedure: REMOVAL PORT-A-CATH;  Surgeon: [name], MD;  Location: [facility];  Service: General;  Laterality: Right;  . PORTACATH PLACEMENT Right 09/29/2013   Procedure: INSERTION  PORT-A-CATH WITH ULTRA SOUND;  Surgeon: [name], MD;  Location: [facility];  Service: General;  Laterality: Right;  . [place]  2012   urerine ablasion  . WISDOM TOOTH EXTRACTION      FAMILY HISTORY Family History  Problem Relation Age of Onset  . Cancer Father 43    colon  . Cancer Maternal Aunt 50    breast cancer  . Cancer Paternal Aunt 87    breast cancer  . Cancer Maternal Grandmother 69    ovarian or uterine cancer  . Cancer Paternal Grandmother     unknown primary   the patient's father died at the age of 26, been diagnosed with colon cancer at the age of 21. The patient's mother is living, currently 47 years old. The patient has one brother, no sisters. One of the patient's mother's sisters was diagnosed with breast cancer at the age of 8 in one of the patient's father's mother was diagnosed with breast cancer at the age of 14. There is no history of ovarian cancer in the family.  GYNECOLOGIC HISTORY:  No LMP recorded. Patient is not currently having periods (Reason: Chemotherapy). Menarche age 57, for the patient is GX P0. She was still having regular periods at the start of chemotherapy. She used oral contraceptives for approximately one year with no complications.  Her periods stopped with chemotherapy and have not resumed. She has been evaluated at the fertility center in [place] and they do not feel they would be able to stimulate production.  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. She is single and her mother lives with her.(The patient's mother is wheelchair-bound secondary to a stroke).    ADVANCED DIRECTIVES: Not in place; at the 08/24/2013 visit the patient was given the appropriate documents to come feel  notarize associate may declare healthcare power of attorney.   HEALTH MAINTENANCE: Social History  Substance Use Topics  . Smoking status: Never Smoker  . Smokeless tobacco: Never Used  . Alcohol use No     Colonoscopy:  PAP:  Bone density:  Lipid panel:  Allergies  Allergen Reactions  . Gadolinium      Desc: NAUSEA WITH MRI CONTRAST-NO VOMITING   . Tramadol Hives and Nausea Only    Current Outpatient Prescriptions  Medication Sig Dispense Refill  . diphenhydrAMINE (BENADRYL) 25 mg capsule Take 25 mg by mouth every 6 (six) hours as needed.    . hydrochlorothiazide (HYDRODIURIL) 25 MG tablet take 1 tablet by mouth once daily 30 tablet 3  . Magnesium 200 MG TABS Take 200 mg by mouth. After supper and at bed time    . Menaquinone-7 (VITAMIN K2 PO) Take 300 mcg by mouth daily.    . milk thistle 175 MG tablet Take 175 mg by mouth 2 (two) times daily.    [name] NATURE-THROID 65 MG tablet Take 65 mg by mouth daily.  0  . OVER THE COUNTER MEDICATION Dim  150 mg twice a day    . tamoxifen (NOLVADEX) 20 MG tablet Take 1 tablet (20 mg total) by mouth daily. 90 tablet 4  . venlafaxine XR (EFFEXOR-XR) 75 MG 24 hr capsule Take 1 capsule (75 mg total) by mouth daily with breakfast. 90 capsule 4   No current facility-administered medications for this visit.     OBJECTIVE: Middle-aged [place] American woman Who appears stated age  64:   11/05/15 1447  BP: (!) 134/93  Pulse: 75  Resp: 18  Temp: 98.5 F (36.9 C)     Body mass index is 39.13 kg/m.    ECOG FS:0 - Asymptomatic  Sclerae unicteric, pupils round and equal Oropharynx clear and moist-- no thrush or other lesions No cervical or supraclavicular adenopathy Lungs no rales or rhonchi Heart regular rate and rhythm Abd soft, nontender, positive bowel sounds MSK no focal spinal tenderness, no upper extremity lymphedema Neuro: nonfocal, well oriented, appropriate affect Breasts: The right breast is status post lumpectomy  followed by radiation. There is no evidence of local recurrence. The right axilla is benign. The left breast is unremarkable.    LAB RESULTS:  CMP     Component Value Date/Time   NA 140 11/05/2015 1420   K 3.6 11/05/2015 1420   CL 100 (L) 08/07/2014 1325   CO2 26 11/05/2015 1420   GLUCOSE 84 11/05/2015 1420   BUN 13.3 11/05/2015 1420   CREATININE 0.8 11/05/2015 1420   CALCIUM 10.0 11/05/2015 1420   PROT 7.5 11/05/2015 1420   ALBUMIN 3.6 11/05/2015 1420   AST 20 11/05/2015 1420   ALT 13 11/05/2015 1420   ALKPHOS 83 11/05/2015 1420   BILITOT 0.36 11/05/2015 1420   GFRNONAA >60 08/07/2014 1325   GFRAA >60 08/07/2014 1325    I No results found for: SPEP  Lab Results  Component Value Date   WBC 5.3 11/05/2015   NEUTROABS 3.1 11/05/2015   HGB 13.6 11/05/2015   HCT 42.0 11/05/2015   MCV 85.7 11/05/2015   PLT 185 11/05/2015      Chemistry      Component Value Date/Time   NA 140 11/05/2015 1420   K 3.6 11/05/2015 1420   CL 100 (L) 08/07/2014 1325   CO2 26 11/05/2015 1420   BUN 13.3 11/05/2015 1420   CREATININE 0.8 11/05/2015 1420      Component Value Date/Time   CALCIUM 10.0 11/05/2015 1420   ALKPHOS 83 11/05/2015 1420   AST 20 11/05/2015 1420   ALT 13 11/05/2015 1420   BILITOT 0.36 11/05/2015 1420       No results found for: LABCA2  No components found for: LABCA125  No results for input(s): INR in the last 168 hours.  Urinalysis    Component Value Date/Time   COLORURINE YELLOW 02/02/2014 0845   APPEARANCEUR CLEAR 02/02/2014 0845   LABSPEC 1.009 02/02/2014 0845   PHURINE 6.0 02/02/2014 0845   GLUCOSEU NEGATIVE 02/02/2014 0845   HGBUR NEGATIVE 02/02/2014 0845   BILIRUBINUR NEGATIVE 02/02/2014 0845   KETONESUR NEGATIVE 02/02/2014 0845   PROTEINUR NEGATIVE 02/02/2014 0845   UROBILINOGEN 0.2 02/02/2014 0845   NITRITE NEGATIVE 02/02/2014 0845   LEUKOCYTESUR TRACE (A) 02/02/2014 0845    STUDIES:  CLINICAL DATA:  40 year old female presenting for  annual evaluation status post right breast lumpectomy and 2015.  EXAM: 2D DIGITAL DIAGNOSTIC BILATERAL MAMMOGRAM WITH CAD AND ADJUNCT TOMO  COMPARISON:  Previous exams.  ACR Breast Density Category b: There are scattered areas of fibroglandular density.  FINDINGS: The right breast lumpectomy site is stable. No suspicious calcifications, masses or areas of distortion are seen in the bilateral breasts.  Mammographic images were processed with CAD.  IMPRESSION: Stable right breast lumpectomy site. No mammographic evidence of malignancy in the bilateral breasts.  RECOMMENDATION: Diagnostic mammogram is suggested in 1 year. (Code:[id number]-B-01Y)  I have discussed the findings and recommendations with the patient. Results were also provided in writing at the conclusion of the visit. If applicable, a reminder letter will be sent to the patient regarding the next appointment.  BI-RADS CATEGORY  2: Benign.   Electronically Signed   By: Ammie Ferrier M.D.   On: 10/01/2015 13:55   ASSESSMENT: 40 y.o. BRCA negative Lane woman status post right breast Upper outer quadrant biopsy 07/17/2013 for high-grade ductal carcinoma in situ, estrogen and progesterone receptor positive  (1) status post right breast upper outer quadrant lumpectomy and sentinel lymph node sampling 08/08/2013 for a pT1b pN0, stage IA invasive ductal carcinoma, grade 3, estrogen and progesterone receptor positive, with an MIB-1 of 62% and no HER-2 amplification  (2) genetics sent 08/02/2013 was normal but did identify a variant of uncertain significance called BRCA2, [id number].  (3) Oncotype score of 23 predicts a risk of outside the breast recurrence within 10 years of 15% if the patient's only systemic treatment is tamoxifen for 5 years. Adjuvant chemotherapy was recommended  (4) doxorubicin and cyclophosphamide in dose dense fashion x4 completed 11/16/2013, followed by paclitaxel weekly x12  completed 03/08/2014  (5) adjuvant radiation 3/9/206-05/21/2014:  Right breast / 45 Gray @ 1.8 Pearline Cables per fraction x 25 fractions Right breast boost / 16 Gray at [employer] per fraction x 8 fractions  (6) tamoxifen started 06/20/2014   PLAN: Winfred is now a little over 2 years out from definitive surgery for her breast cancer with no evidence of disease recurrence. This is very favorable.  She is tolerating tamoxifen well and the plan will be to continue that a minimum of 5 year. She may wish to consider going on additional 5 years given her young age of diagnosis.  She is beginning to accept the fact that she may never be able to carry a child, and is interested in considering adoption. We will see if we can facilitate that for her.  Otherwise she will return to see our survivorship nurse practitioner in 6 months and see me again in one year.  She knows to call for any problems that may develop before that visit.  Chauncey Cruel, MD   11/05/2015 7:14 PM

## 2015-11-06 LAB — ESTRADIOL: ESTRADIOL: 40.8 pg/mL

## 2015-11-06 LAB — LUTEINIZING HORMONE: LH: 35.5 m[IU]/mL

## 2015-11-06 LAB — FOLLICLE STIMULATING HORMONE: FSH: 54.1 m[IU]/mL

## 2015-12-05 ENCOUNTER — Telehealth: Payer: Self-pay | Admitting: Adult Health

## 2015-12-05 NOTE — Telephone Encounter (Signed)
I received a call from Jennifer Frazier; she is requesting information about the process for adoption.  She expresses interest in being able to adopt an infant and is not sure who to contact about this.    I let her know that I am not aware of the appropriate steps or resources, but that I would definitely send a message to our social workers and see if they can give her a call.  I shared that our social workers will likely be the best resource to help point her in the right direction of who she should speak with next and what her next steps should be.  She voiced appreciation and understanding and will await a phone call from one of our social workers. I encouraged her to call me with any other questions or concerns.    I will forward this message to [facility], LCSW and Johnnye Lana, LCSW to get their assistance.  She would prefer a call back on her cell phone [phone]).     Mike Craze, NP [place] [phone]

## 2015-12-09 ENCOUNTER — Encounter: Payer: Self-pay | Admitting: Oncology

## 2015-12-12 ENCOUNTER — Encounter: Payer: Self-pay | Admitting: *Deleted

## 2015-12-12 NOTE — Progress Notes (Signed)
[place] Work  Clinical Social Work was referred by Engineer, mining for assessment of psychosocial needs due to questions about adoption options after cancer treatment.  Clinical Social Worker contacted patient at home to offer support and assess for needs.  CSW provided brief education to patient on common adoption practices in [place], need for home study, costs, resources to pursue, etc. CSW has experience working with adoptions from previous work at [facility] [place] educated pt on a few additional resources related to how cancer diagnosis can impact adoption process. CSW is not aware of specific funding available to assist pt with adoption process due to breast cancer diagnosis, but did educate her on some less expensive options, such as becoming a foster parent in order to get her home study done. Pt plans to reach out to adoption agencies for additional education and assistance.   Clinical Social Work interventions: Resource assistance/education  Loren Racer, [place] Worker [place]  [place] Phone: [phone] Fax: [phone]

## 2016-01-21 ENCOUNTER — Other Ambulatory Visit: Payer: Self-pay | Admitting: *Deleted

## 2016-01-21 MED ORDER — HYDROCHLOROTHIAZIDE 25 MG PO TABS: 25.0000 mg | ORAL_TABLET | Freq: Every day | ORAL | 3 refills | Status: DC

## 2016-03-23 ENCOUNTER — Telehealth: Payer: Self-pay | Admitting: Adult Health

## 2016-03-23 MED ORDER — VENLAFAXINE HCL ER 37.5 MG PO CP24: 37.5000 mg | ORAL_CAPSULE | Freq: Every day | ORAL | 0 refills | Status: DC

## 2016-03-23 NOTE — Telephone Encounter (Signed)
Patient called me today trying to get with Mike Craze, NP.  Patient is a breast cancer survivor.  I informed patient that Elzie Rings had transferred to [facility].  Patient is c/o Effexor causing headaches and and is also concerned that it is causing weight gain.  She wants to taper off of effexor.  I sent in 37.5mg  effexor xr tablets to her pharmacy and she will taper down to 37.5 mg tablets for a 3 weeks and then alternate them every other day for 1-2 weeks.  She is concerned about her weight gain ans was told she may have some endocrine issues.  I instructed her to wean off the effexor, keep a headache diary, and also keep a food diary and to keep track of her exercise and steps throughout the day.  She will do these things and will follow up in April, 2018 as scheduled when we can discuss this in further detail with all of the surrounding facts.    Charlestine Massed, NP

## 2016-04-22 ENCOUNTER — Telehealth: Payer: Self-pay | Admitting: Oncology

## 2016-04-22 NOTE — Telephone Encounter (Signed)
FAXED RECORDS TO DR [phone]

## 2016-05-08 ENCOUNTER — Encounter: Payer: Self-pay | Admitting: Adult Health

## 2016-05-08 ENCOUNTER — Encounter: Payer: 59 | Admitting: Adult Health

## 2016-05-08 ENCOUNTER — Ambulatory Visit (HOSPITAL_BASED_OUTPATIENT_CLINIC_OR_DEPARTMENT_OTHER): Payer: 59 | Admitting: Adult Health

## 2016-05-08 VITALS — BP 150/91 | HR 83 | Temp 97.8°F | Resp 20 | Ht 69.0 in | Wt 282.1 lb

## 2016-05-08 DIAGNOSIS — Z170 Estrogen receptor positive status [ER+]: Secondary | ICD-10-CM | POA: Diagnosis not present

## 2016-05-08 DIAGNOSIS — C50411 Malignant neoplasm of upper-outer quadrant of right female breast: Secondary | ICD-10-CM

## 2016-05-08 DIAGNOSIS — Z79810 Long term (current) use of selective estrogen receptor modulators (SERMs): Secondary | ICD-10-CM

## 2016-05-08 DIAGNOSIS — R635 Abnormal weight gain: Secondary | ICD-10-CM | POA: Diagnosis not present

## 2016-05-08 NOTE — Progress Notes (Signed)
CLINIC:  Survivorship   REASON FOR VISIT:  Routine follow-up for history of breast cancer.   BRIEF ONCOLOGIC HISTORY:    Breast cancer of upper-outer quadrant of right female breast (Jennifer Frazier)   07/17/2013 Initial Diagnosis    Breast cancer of upper-outer quadrant of right female breast      07/17/2013 Initial Biopsy    Right breast needle core biopsy: Grade 3, DCIS with comedo-type necrosis.  ER+ (100%), PR+ (96%).       07/25/2013 Breast MRI    Right breast (UOQ): 3 cm clumped nodular NME with associated clip artifact, corresponds with recently diagnosed DCIS. Marked bilateral parenchymal enhancing foci. No evidence of adenopathy. No worrisome foci in left breast.       08/02/2013 Procedure    Genetic counseling/testing: Revealed 1 VUS on BRCA2 gene: p.E3309K (c.9925G>A).  Otherwise, 24 gene panel with [name] was (-).       08/08/2013 Oncotype testing    Recurrence score: 23 (15% ROR). Adjuvant chemotherapy planned (Magrinat)      08/08/2013 Definitive Surgery    Right breast lumpectomy with SLNB ([place]): Grade 3 IDC, spanning 0.7 cm.  Also grade 3 DCIS. Negative margins.  2 right axillary SLN removed and both were benign. ER+ (86%), PR+ (89), HER2- (ratio 1.07). Ki67 62%.       08/08/2013 Pathologic Stage    pT1b, pN0: Stage IA      09/26/2013 Echocardiogram    Pre-chemo EF: 55-60%.       10/05/2013 - 11/16/2013 Adjuvant Chemotherapy    Adriamycin & Cytoxan x 4 cycles completed. (Magrinat)      10/10/2013 Imaging    CT c/a/p: No axillary lymphadenopathy. No evidence of mets in the chest, abdomen, or pelvis.       11/30/2013 - 03/08/2014 Adjuvant Chemotherapy    Taxol x 12 cycles completed. (Magrinat)      04/04/2014 - 05/21/2014 Radiation Therapy    Adjuvant RT completed Pablo Ledger).  Right breast: Total dose 45 Gy over 25 fractions. Right breast boost: Total dose 16 Gy over 8 fractions.        Anti-estrogen oral therapy    Planned duration of anti-estrogen  therapy is 5-10 years. Pt will start with Tamoxifen in 07/2014 and may transition to aromatase inhibitor in 2-3 years (Magrinat).       06/21/2014 Survivorship    Survivorship Care Plan given to patient and reviewed with her in person.         INTERVAL HISTORY:  Jennifer Frazier presents to the Survivorship Clinic today for routine follow-up for her history of breast cancer.  Overall, she reports feeling quite well. She is doing well today.  She is taking tamoxifen daily and tolerating it well for the most part.  She does have some joint aches and pains and feels 41 years old some mornings.  This pain is intermittent.  She is also having some weight gain that she is struggling with and wants to know what to do about the weight.      REVIEW OF SYSTEMS:  Review of Systems  Constitutional: Negative for chills, fever, malaise/fatigue and weight loss.  HENT: Negative for hearing loss and tinnitus.   Eyes: Negative for blurred vision and double vision.  Respiratory: Negative for cough and shortness of breath.   Cardiovascular: Negative for chest pain and palpitations.  Gastrointestinal: Negative for abdominal pain, blood in stool, constipation, diarrhea, heartburn, melena, nausea and vomiting.  Genitourinary: Negative for dysuria.  Musculoskeletal: Positive for  joint pain.  Skin: Negative for rash.  Neurological: Negative for dizziness, tingling, focal weakness and headaches.  Endo/Heme/Allergies: Negative for environmental allergies. Does not bruise/bleed easily.  Psychiatric/Behavioral: Negative for depression. The patient is not nervous/anxious.   Breast: Denies any new nodularity, masses, tenderness, nipple changes, or nipple discharge.       PAST MEDICAL/SURGICAL HISTORY:  Past Medical History:  Diagnosis Date  . Anemia   . Anxiety   . Cancer ([place])    Breast  . Contact lens/glasses fitting    wears contacts or glasses  . Depression   . Fibroids    uterine fibroids  .  Hypertension   . Hypothyroidism   . Radiation 04/04/14-05/21/14   Right upper-outer breast  . Sleep apnea    has a cpap-does not always use it  . Thyroid disease    Past Surgical History:  Procedure Laterality Date  . BREAST SURGERY     lumpectomy 7/14  . EYE SURGERY  2013   retinal tear-lazer-both  . PORT A CATH REVISION N/A 12/19/2013   Procedure: REPOSITION PORT;  Surgeon: [name], MD;  Location: [facility];  Service: General;  Laterality: N/A;  . PORT-A-CATH REMOVAL Right 08/08/2014   Procedure: REMOVAL PORT-A-CATH;  Surgeon: [name], MD;  Location: [facility];  Service: General;  Laterality: Right;  . PORTACATH PLACEMENT Right 09/29/2013   Procedure: INSERTION PORT-A-CATH WITH ULTRA SOUND;  Surgeon: [name], MD;  Location: [facility];  Service: General;  Laterality: Right;  . [place]  2012   urerine ablasion  . WISDOM TOOTH EXTRACTION       ALLERGIES:  Allergies  Allergen Reactions  . Gadolinium      Desc: NAUSEA WITH MRI CONTRAST-NO VOMITING   . Tramadol Hives and Nausea Only     CURRENT MEDICATIONS:  Outpatient Encounter Prescriptions as of 05/08/2016  Medication Sig Note  . diphenhydrAMINE (BENADRYL) 25 mg capsule Take 25 mg by mouth every 6 (six) hours as needed.   . Magnesium 200 MG TABS Take 200 mg by mouth. After supper and at bed time   . milk thistle 175 MG tablet Take 175 mg by mouth 2 (two) times daily.   [name] NATURE-THROID 65 MG tablet Take 65 mg by mouth daily. 08/07/2015: Received from: External Pharmacy Received Sig:   . OVER THE COUNTER MEDICATION Dim  150 mg twice a day   . tamoxifen (NOLVADEX) 20 MG tablet Take 1 tablet (20 mg total) by mouth daily.   . hydrochlorothiazide (HYDRODIURIL) 25 MG tablet Take 1 tablet (25 mg total) by mouth daily. (Patient not taking: Reported on 05/08/2016)   . Menaquinone-7 (VITAMIN K2 PO) Take 300 mcg by mouth daily.   [name] venlafaxine XR (EFFEXOR-XR) 37.5 MG 24 hr  capsule Take 1 capsule (37.5 mg total) by mouth daily with breakfast. (Patient not taking: Reported on 05/08/2016) 05/08/2016: Stopped taking 04/29/16   No facility-administered encounter medications on file as of 05/08/2016.      ONCOLOGIC FAMILY HISTORY:  Family History  Problem Relation Age of Onset  . Cancer Father 23    colon  . Cancer Maternal Aunt 50    breast cancer  . Cancer Paternal Aunt 71    breast cancer  . Cancer Maternal Grandmother 35    ovarian or uterine cancer  . Cancer Paternal Grandmother     unknown primary    GENETIC COUNSELING/TESTING: Genetic counseling/testing: Revealed 1 VUS on BRCA2 gene: p.E3309K (c.9925G>A).  Otherwise,  24 gene panel with [name] was (-).   SOCIAL HISTORY:  Jennifer Frazier is single and lives with her mom in [place], [place].   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  She denies any current or history of tobacco, alcohol, or illicit drug use.     PHYSICAL EXAMINATION:  Vital Signs: Vitals:   05/08/16 1346  BP: (!) 150/91  Pulse: 83  Resp: 20  Temp: 97.8 F (36.6 C)   Filed Weights   05/08/16 1346  Weight: 282 lb 1.6 oz (128 kg)   General: Well-nourished, well-appearing female in no acute distress.  Accompanied by her mother today.   HEENT: Head is normocephalic.  Pupils equal and reactive to light. Conjunctivae clear without exudate.  Sclerae anicteric. Oral mucosa is pink, moist.  Oropharynx is pink without lesions or erythema.  Lymph: No cervical, supraclavicular, or infraclavicular lymphadenopathy noted on palpation.  Cardiovascular: Regular rate and rhythm.[name] Respiratory: Clear to auscultation bilaterally. Chest expansion symmetric; breathing non-labored.  Breast Exam:  -Left breast: No appreciable masses on palpation. No skin redness, thickening, or peau d'orange appearance; no nipple retraction or nipple discharge;  -Right breast: No appreciable masses on palpation. No  skin redness, thickening, or peau d'orange appearance; no nipple retraction or nipple discharge; mild distortion in symmetry at previous lumpectomy site well healed scar without erythema or nodularity. -Axilla: No axillary adenopathy bilaterally.  GI: Abdomen soft and round; non-tender, non-distended. Bowel sounds normoactive. No hepatosplenomegaly.   GU: Deferred.  Neuro: No focal deficits. Steady gait.  Psych: Mood and affect normal and appropriate for situation.  Extremities: No edema. Skin: Warm and dry.  LABORATORY DATA:  None for this visit   DIAGNOSTIC IMAGING:  Most recent mammogram:     ASSESSMENT AND PLAN:  Jennifer Frazier is a pleasant 41 y.o. female with history of Stage IA right breast invasive ductal carcinoma, ER+/PR+/HER2-, diagnosed in (date), treated with lumpectomy, adjuvant radiation therapy, and anti-estrogen therapy with Tamoxifen starting in 07/2014.  She presents to the Survivorship Clinic for surveillance and routine follow-up.   1. History of breast cancer:  Jennifer Frazier is currently clinically and radiographically without evidence of disease or recurrence of breast cancer. She will be due for mammogram in 09/2016.  She will continue her anti-estrogen therapy with Tamoxifen daily, with plans to continue for 5-10 years.  She will return to the cancer center to see her medical oncologist, Dr. Jana Hakim, in October/2018.  I encouraged her to call me with any questions or concerns before her next visit at the cancer center, and I would be happy to see her sooner, if needed.    2. Weight gain.  We discussed weight loss options such as joining a weight loss competition, getting a coach, or going to a physician for evaluation and screening for weight loss plans.  She has been recommended to see endocrinologist in the past and has declined a full work up.    3. Cancer screening:  Due to Jennifer Frazier's history and her age, she should receive screening for skin cancers, colon cancer, and   gynecologic cancers. She was encouraged to follow-up with her PCP for appropriate cancer screenings.   4. Health maintenance and wellness promotion: Jennifer Frazier was encouraged to consume 5-7 servings of fruits and vegetables per day. She was also encouraged to engage in moderate to vigorous exercise for 30 minutes per day most days of the week. She was instructed to limit her alcohol  consumption and continue to abstain from tobacco use.     Dispo:  -Return to cancer center in October for follow up with Dr. Jana Hakim.   A total of (30) minutes of face-to-face time was spent with this patient with greater than 50% of that time in counseling and care-coordination.   Gardenia Phlegm, NP Survivorship Program [facility] [phone]   Note: PRIMARY CARE PROVIDER Sharilyn Sites [place], [place] [phone]

## 2016-08-31 ENCOUNTER — Other Ambulatory Visit: Payer: Self-pay | Admitting: Oncology

## 2016-08-31 DIAGNOSIS — Z9889 Other specified postprocedural states: Secondary | ICD-10-CM

## 2016-10-01 ENCOUNTER — Ambulatory Visit
Admission: RE | Admit: 2016-10-01 | Discharge: 2016-10-01 | Disposition: A | Discharge status: Home / Self Care | Payer: 59 | Source: Ambulatory Visit | Attending: Oncology | Admitting: Oncology

## 2016-10-01 DIAGNOSIS — Z9889 Other specified postprocedural states: Secondary | ICD-10-CM

## 2016-10-01 HISTORY — DX: Personal history of antineoplastic chemotherapy: Z92.21

## 2016-10-01 HISTORY — DX: Personal history of irradiation: Z92.3

## 2016-11-03 NOTE — Progress Notes (Signed)
[facility]  Telephone:(336) [phone] Fax:(336) [phone]     ID: Jennifer Frazier DOB: [birth year]  MR#: [id number]  SLH#:[id number]  Patient Care Team: Sharilyn Sites, MD as PCP - General (Family Medicine) Magrinat, Virgie Dad, MD as Consulting Physician (Oncology) Thea Silversmith, MD (Inactive) as Consulting Physician (Radiation Oncology) [name], MD as Consulting Physician (General Surgery) Hinda Lenis (Dermatology) OTHER MD: Legrand Como Altheimer MD  CHIEF COMPLAINT: Estrogen receptor positive breast cancer  CURRENT TREATMENT: Tamoxifen   BREAST CANCER HISTORY: From the original intake note:  The patient had screening mammography at [facility] showing calcifications in the right breast, and was referred to the breast Center 07/06/2013 for additional views. Right diagnostic mammography confirmed a group of amorphous calcifications in the upper outer right breast measuring 1.2 cm.  Biopsy of this area 07/17/2013 showed ([id number]) ductal carcinoma in situ, high-grade, estrogen receptor 100% positive, and progesterone receptor 96% positive, both with strong staining intensity.  The patient was then referred to surgery and on 07/25/2013 underwent bilateral breast MRI. This showed a breast density category CEA. In the right breast there was an area of clumped nodular enhancement measuring 3 cm. There was also a 1.5 cm circumscribed oval mass in the medial portion of the right breast consistent with a fibroadenoma. The left breast was unremarkable, and there were no abnormal appearing lymph nodes.  On 08/08/2013 the patient underwent right lumpectomy and sentinel lymph node sampling. The pathology ([id number] was (showed, in addition to ductal carcinoma in situ, invasive ductal carcinoma, measuring 0.7 cm, grade 3, estrogen receptor 86% positive, progesterone receptor 89% positive, both with strong staining intensity, with an MIB-1 of 62%, and no HER-2  amplification. Both sentinel lymph nodes were clear. Margins were ample.   The patient's subsequent history is as detailed below  INTERVAL HISTORY: Jennifer Frazier returns today for follow-up and treatment of her estrogen receptor positive breast cancer. She continues on tamoxifen, which she tolerates well overall. She intermittently has hot flashes, but these are rare. She doesn't have an increase in vaginal wetness.   Her menstrual cycles never resumed after chemotherapy.  REVIEW OF SYSTEMS: Nhyla reports being forgetful more recently since her cancer diagnosis. Pt has intermittent left ankle swelling. She notes joint pain localized to her back and legs and intermittently to her bilateral feet. Her joint pain is exacerbated with standing. She notes recent weight gain. For exercise, she goes to a [facility] once a week and [employer] 3 days a week and completes 30-40 minutes of cardio. She attempted to go vegan, but gained weight with this diet method. Pt is now trying a keto diet to aid with weight loss and notes that she has been successful in the past. She denies unusual headaches, visual changes, nausea, vomiting, or dizziness. There has been no unusual cough, phlegm production, or pleurisy. This been no change in bowel or bladder habits. She denies unexplained fatigue or unexplained weight loss, bleeding, rash, or fever. A detailed review of systems was otherwise negative.    PAST MEDICAL HISTORY: Past Medical History:  Diagnosis Date  . Anemia   . Anxiety   . Cancer ([place])    Breast  . Contact lens/glasses fitting    wears contacts or glasses  . Depression   . Fibroids    uterine fibroids  . Hypertension   . Hypothyroidism   . Personal history of chemotherapy   . Personal history of radiation therapy   . Radiation 04/04/14-05/21/14  Right upper-outer breast  . Sleep apnea    has a cpap-does not always use it  . Thyroid disease     PAST SURGICAL HISTORY: Past Surgical History:    Procedure Laterality Date  . BREAST LUMPECTOMY Right 08/03/2013  . BREAST SURGERY     lumpectomy 7/14  . EYE SURGERY  2013   retinal tear-lazer-both  . PORT A CATH REVISION N/A 12/19/2013   Procedure: REPOSITION PORT;  Surgeon: [name], MD;  Location: [facility];  Service: General;  Laterality: N/A;  . PORT-A-CATH REMOVAL Right 08/08/2014   Procedure: REMOVAL PORT-A-CATH;  Surgeon: [name], MD;  Location: [facility];  Service: General;  Laterality: Right;  . PORTACATH PLACEMENT Right 09/29/2013   Procedure: INSERTION PORT-A-CATH WITH ULTRA SOUND;  Surgeon: [name], MD;  Location: [facility];  Service: General;  Laterality: Right;  . [place]  2012   urerine ablasion  . WISDOM TOOTH EXTRACTION      FAMILY HISTORY Family History  Problem Relation Age of Onset  . Cancer Father 32       colon  . Cancer Maternal Aunt 50       breast cancer  . Cancer Paternal Aunt 47       breast cancer  . Cancer Maternal Grandmother 66       ovarian or uterine cancer  . Cancer Paternal Grandmother        unknown primary   the patient's father died at the age of 44, been diagnosed with colon cancer at the age of 21. The patient's mother is living, currently 29 years old. The patient has one brother, no sisters. One of the patient's mother's sisters was diagnosed with breast cancer at the age of 45 in one of the patient's father's mother was diagnosed with breast cancer at the age of 73. There is no history of ovarian cancer in the family.  GYNECOLOGIC HISTORY:  No LMP recorded. Patient is not currently having periods (Reason: Chemotherapy). Menarche age 21, for the patient is GX P0. She was still having regular periods at the start of chemotherapy. She used oral contraceptives for approximately one year with no complications.  Her periods stopped with chemotherapy and have not resumed. She has been evaluated at the fertility center in [place] and they do not feel they would be able to stimulate production.  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 She finds the work challenging and interesting but also stressful. She is single and her mother lives with her.(The patient's mother is wheelchair-bound secondary to a stroke).    ADVANCED DIRECTIVES: Not in place; at the 08/24/2013 visit the patient was given the appropriate documents to come feel notarize associate may declare healthcare power of attorney.   HEALTH MAINTENANCE: Social History  Substance Use Topics  . Smoking status: Never Smoker  . Smokeless tobacco: Never Used  . Alcohol use No     Colonoscopy:  PAP:  Bone density:  Lipid panel:  Allergies  Allergen Reactions  . Gadolinium      Desc: NAUSEA WITH MRI CONTRAST-NO VOMITING   . Tramadol Hives and Nausea Only    Current Outpatient Prescriptions  Medication Sig Dispense Refill  . milk thistle 175 MG tablet  Take 175 mg by mouth 2 (two) times daily.    [name] OVER THE COUNTER MEDICATION Dim  150 mg twice a day    . tamoxifen (NOLVADEX) 20 MG tablet Take 1 tablet (20 mg total) by mouth daily. 90 tablet 4   No current facility-administered medications for this visit.     OBJECTIVE: Morbidly obese African American woman In no acute distress  Vitals:   11/04/16 1514  BP: (!) 153/94  Pulse: 75  Resp: 19  Temp: 98.5 F (36.9 C)  SpO2: 100%     Body mass index is 44.29 kg/m.    ECOG FS:0 - Asymptomatic  Sclerae unicteric, pupils round and equal Oropharynx clear and moist No cervical or supraclavicular adenopathy Lungs no rales or rhonchi Heart regular rate and rhythm Abd soft, nontender, positive bowel sounds MSK no focal spinal tenderness, no upper extremity  lymphedema Neuro: nonfocal, well oriented, appropriate affect Breasts: The right breast is status post lumpectomy and radiation. There is no evidence of local recurrence. The left breast is benign. Both axillae are benign.   LAB RESULTS:  CMP     Component Value Date/Time   NA 140 11/05/2015 1420   K 3.6 11/05/2015 1420   CL 100 (L) 08/07/2014 1325   CO2 26 11/05/2015 1420   GLUCOSE 84 11/05/2015 1420   BUN 13.3 11/05/2015 1420   CREATININE 0.8 11/05/2015 1420   CALCIUM 10.0 11/05/2015 1420   PROT 7.5 11/05/2015 1420   ALBUMIN 3.6 11/05/2015 1420   AST 20 11/05/2015 1420   ALT 13 11/05/2015 1420   ALKPHOS 83 11/05/2015 1420   BILITOT 0.36 11/05/2015 1420   GFRNONAA >60 08/07/2014 1325   GFRAA >60 08/07/2014 1325    I No results found for: SPEP  Lab Results  Component Value Date   WBC 5.7 11/04/2016   NEUTROABS 3.3 11/04/2016   HGB 13.3 11/04/2016   HCT 40.6 11/04/2016   MCV 84.4 11/04/2016   PLT 208 11/04/2016      Chemistry      Component Value Date/Time   NA 140 11/05/2015 1420   K 3.6 11/05/2015 1420   CL 100 (L) 08/07/2014 1325   CO2 26 11/05/2015 1420   BUN 13.3 11/05/2015 1420   CREATININE 0.8 11/05/2015 1420      Component Value Date/Time   CALCIUM 10.0 11/05/2015 1420   ALKPHOS 83 11/05/2015 1420   AST 20 11/05/2015 1420   ALT 13 11/05/2015 1420   BILITOT 0.36 11/05/2015 1420       No results found for: LABCA2  No components found for: LABCA125  No results for input(s): INR in the last 168 hours.  Urinalysis    Component Value Date/Time   COLORURINE YELLOW 02/02/2014 0845   APPEARANCEUR CLEAR 02/02/2014 0845   LABSPEC 1.009 02/02/2014 0845   PHURINE 6.0 02/02/2014 0845   GLUCOSEU NEGATIVE 02/02/2014 0845   HGBUR NEGATIVE 02/02/2014 0845   BILIRUBINUR NEGATIVE 02/02/2014 0845   KETONESUR NEGATIVE 02/02/2014 0845   PROTEINUR NEGATIVE 02/02/2014 0845   UROBILINOGEN 0.2 02/02/2014 0845   NITRITE NEGATIVE 02/02/2014 0845    LEUKOCYTESUR TRACE (A) 02/02/2014 0845    STUDIES: Mammography at the [facility] 10/01/2016 shows the breast density to be category C. There was no evidence of malignancy.   ASSESSMENT: 41 y.o. BRCA negative [place] woman status post right breast Upper outer quadrant biopsy 07/17/2013 for high-grade ductal carcinoma in situ, estrogen and progesterone receptor positive  (1) status post right breast upper outer  quadrant lumpectomy and sentinel lymph node sampling 08/08/2013 for a pT1b pN0, stage IA invasive ductal carcinoma, grade 3, estrogen and progesterone receptor positive, with an MIB-1 of 62% and no HER-2 amplification  (2) genetics sent 08/02/2013 was normal but did identify a variant of uncertain significance called BRCA2, p.C6237S.  (3) Oncotype score of 23 predicts a risk of outside the breast recurrence within 10 years of 15% if the patient's only systemic treatment is tamoxifen for 5 years. Adjuvant chemotherapy was recommended  (4) doxorubicin and cyclophosphamide in dose dense fashion x4 completed 11/16/2013, followed by paclitaxel weekly x12 completed 03/08/2014  (5) adjuvant radiation 3/9/206-05/21/2014:  Right breast / 45 Gray @ 1.8 Pear[place] Cables per fraction x 25 fractions Right breast boost / 16 Gray at [employer] per fraction x 8 fractions  (6) tamoxifen started 06/20/2014   PLAN: I spent approximately 30 minutes with when they going over her multiple questions. She is now a little over 3 years out from definitive surgery for her breast cancer with no evidence of disease recurrence. This is very favorable.  She continues on tamoxifen, with good tolerance. The plan is to continue that for a total of 10 years.  Her biggest concern is her weight. It does continue to go up. Per her report she has an excellent exercise program and exercises for 5 days a week. She recently changed to a keto genetic diet. Despite that she has continued to gain weight.  We discussed the  possibility of bariatric surgery but at least at this point she does not wish to pursue that option.  We did discuss the limitation of all carbohydrates from her diet which I think would be a good start. She is scheduled for a nutrition consult within the next week.  She thought that the stiffness she is feeling in the morning and after sitting for long time might be related to tamoxifen or fibromyalgia. I really think it simple arthritis and lack of exercise. I strongly encouraged her to try to do some tai chi-type exercises or swim, which I think would largely take care of the problem.  She also complained of mild cognitive dysfunction. I think she would be a good candidate for the REMEMBER study. That referral was placed today  Otherwise she will return to see me in one year. She knows to call for any other issues that may develop before that visit.  Chauncey Cruel, MD 11/04/16 3:27 PM Medical Oncology and Hematology [facility] [address] [place], [place] [address] Tel. [phone] Fax. [phone]  This document serves as a record of services personally performed by Lurline Del, MD. It was created on her behalf by Steva Colder, a trained medical scribe. The creation of this record is based on the scribe's personal observations and the provider's statements to them. This document has been checked and approved by the attending provider.

## 2016-11-04 ENCOUNTER — Other Ambulatory Visit (HOSPITAL_BASED_OUTPATIENT_CLINIC_OR_DEPARTMENT_OTHER): Payer: 59

## 2016-11-04 ENCOUNTER — Telehealth: Payer: Self-pay | Admitting: Oncology

## 2016-11-04 ENCOUNTER — Encounter: Payer: Self-pay | Admitting: *Deleted

## 2016-11-04 ENCOUNTER — Ambulatory Visit (HOSPITAL_BASED_OUTPATIENT_CLINIC_OR_DEPARTMENT_OTHER): Payer: 59 | Admitting: Oncology

## 2016-11-04 VITALS — BP 153/94 | HR 75 | Temp 98.5°F | Resp 19 | Ht 69.0 in | Wt 299.9 lb

## 2016-11-04 DIAGNOSIS — Z170 Estrogen receptor positive status [ER+]: Secondary | ICD-10-CM

## 2016-11-04 DIAGNOSIS — D0511 Intraductal carcinoma in situ of right breast: Secondary | ICD-10-CM

## 2016-11-04 DIAGNOSIS — Z79810 Long term (current) use of selective estrogen receptor modulators (SERMs): Secondary | ICD-10-CM | POA: Diagnosis not present

## 2016-11-04 DIAGNOSIS — C50411 Malignant neoplasm of upper-outer quadrant of right female breast: Secondary | ICD-10-CM

## 2016-11-04 DIAGNOSIS — D0510 Intraductal carcinoma in situ of unspecified breast: Secondary | ICD-10-CM

## 2016-11-04 LAB — CBC WITH DIFFERENTIAL/PLATELET
BASO%: 0.4 % (ref 0.0–2.0)
Basophils Absolute: 0 10*3/uL (ref 0.0–0.1)
EOS%: 1.5 % (ref 0.0–7.0)
Eosinophils Absolute: 0.1 10*3/uL (ref 0.0–0.5)
HCT: 40.6 % (ref 34.8–46.6)
HGB: 13.3 g/dL (ref 11.6–15.9)
LYMPH%: 35.2 % (ref 14.0–49.7)
MCH: 27.7 pg (ref 25.1–34.0)
MCHC: 32.9 g/dL (ref 31.5–36.0)
MCV: 84.4 fL (ref 79.5–101.0)
MONO#: 0.3 10*3/uL (ref 0.1–0.9)
MONO%: 4.6 % (ref 0.0–14.0)
NEUT%: 58.3 % (ref 38.4–76.8)
NEUTROS ABS: 3.3 10*3/uL (ref 1.5–6.5)
Platelets: 208 10*3/uL (ref 145–400)
RBC: 4.8 10*6/uL (ref 3.70–5.45)
RDW: 13.9 % (ref 11.2–14.5)
WBC: 5.7 10*3/uL (ref 3.9–10.3)
lymph#: 2 10*3/uL (ref 0.9–3.3)

## 2016-11-04 LAB — COMPREHENSIVE METABOLIC PANEL
ALK PHOS: 87 U/L (ref 40–150)
ALT: 15 U/L (ref 0–55)
ANION GAP: 8 meq/L (ref 3–11)
AST: 20 U/L (ref 5–34)
Albumin: 3.8 g/dL (ref 3.5–5.0)
BILIRUBIN TOTAL: 0.31 mg/dL (ref 0.20–1.20)
BUN: 17.6 mg/dL (ref 7.0–26.0)
CO2: 27 meq/L (ref 22–29)
CREATININE: 1.1 mg/dL (ref 0.6–1.1)
Calcium: 9.6 mg/dL (ref 8.4–10.4)
Chloride: 105 mEq/L (ref 98–109)
EGFR: >60 mL/min/{1.73_m2} (ref >60)
Glucose: 88 mg/dl (ref 70–140)
Potassium: 3.8 mEq/L (ref 3.5–5.1)
SODIUM: 140 meq/L (ref 136–145)
TOTAL PROTEIN: 7.5 g/dL (ref 6.4–8.3)

## 2016-11-04 MED ORDER — TAMOXIFEN CITRATE 20 MG PO TABS: 20.0000 mg | ORAL_TABLET | Freq: Every day | ORAL | 4 refills | Status: DC

## 2016-11-04 NOTE — Telephone Encounter (Signed)
Gave patient avs report and appointments for October 2019.  °

## 2016-11-05 NOTE — Progress Notes (Signed)
Note opened in error.  See Consent Form Encounter dated 11/04/16.

## 2016-11-09 ENCOUNTER — Encounter: Payer: Self-pay | Admitting: *Deleted

## 2016-12-08 ENCOUNTER — Encounter: Payer: 59 | Attending: Obstetrics and Gynecology | Admitting: Nutrition

## 2016-12-08 ENCOUNTER — Encounter: Payer: Self-pay | Admitting: Nutrition

## 2016-12-08 DIAGNOSIS — C50211 Malignant neoplasm of upper-inner quadrant of right female breast: Secondary | ICD-10-CM

## 2016-12-08 DIAGNOSIS — Z170 Estrogen receptor positive status [ER+]: Secondary | ICD-10-CM

## 2016-12-08 NOTE — Patient Instructions (Addendum)
Goals 1 Exercise 30 minutes 4 times per week 2. Increase fresh fruits and vegetables 3. Don't skip meals 4. Drink only water- 1 gallon a day. Lose 1-2 lbs per week

## 2016-12-08 NOTE — Progress Notes (Signed)
  Medical Nutrition Therapy:  Appt start time: 9767 end time:  1630.  Assessment:  Primary concerns today: Obesity, s/p breast cancer.  She lives by herself and cares for her mom.  She does the shopping and cooking in the home. She works at [employer] and works from home. Eats 2 meals per day. Most foods are grilled and raw. She notes she doesn't cook meat at home but will eat animal meat out-goes to [employer] often. Physical activity> Walks some and does some at [employer].[name] Post breast cancer on right breast  2 years ago. On Tamoxifen. String family history of cancer. Feels like she has gained a lot of weight in the last few months. Doesn't want to know what her weight is. Requests to not tell her what her weight is when getting weighed. Is motivated to make lifestyle changes. Has followed a Keto and restriction diet for awhile and finds she is gaining weight instead of losing weight. Current diet is too restrictive and not well balanced to meet her needs.   Wt Readings from Last 3 Encounters:  12/08/16 (!) 301 lb (136.5 kg)  11/04/16 299 lb 14.4 oz (136 kg)  05/08/16 282 lb 1.6 oz (128 kg)   Ht Readings from Last 3 Encounters:  12/08/16 5\' 8"  (1.727 m)  11/04/16 5\' 9"  (1.753 m)  05/08/16 5\' 9"  (1.753 m)   Body mass index is 45.77 kg/m.  Preferred Learning Style:   No preference indicated   Learning Readiness:  Ready  Change in progress   MEDICATIONS:   DIETARY INTAKE:  24-hr recall:  B ( AM):1 cup lipton tea, keto creamer, keto bar, peanuts 1/4 cup, cheese 4 slices.   Snk ( AM):  L ( PM): Keto bar Snk ( PM):  D ( PM): Broccoli, tofu chicken, 6 cauliflower and Zevia soda Snk ( PM):  Beverages: Water, hot tea and Zevia   Usual physical activity:  1 a week.  Estimated energy needs: 1500  calories 170 g carbohydrates 112 g protein 42 g fat  Progress Towards Goal(s):  In progress.   Nutritional Diagnosis:  NB-1.1 Food and nutrition-related knowledge deficit As  related to OBesity .  As evidenced by BMI > 40.    Intervention:  My Plate, portion sizes, meal planning, healthy weight loss tips, benefits of exercise, nutrient dense foods, water intake, emotional eating. High fiber foods. Avoiding snacks. Benefits of weight loss s/p breast cancer. Encouraged to follow a plant based diet.   Goals 1 Exercise 30 minutes 4 times per week 2. Increase fresh fruits and vegetables 3. Don't skip meals 4. Drink only water Lose 1-2 lbs per week   Teaching Method Utilized:  Visual Auditory Hands on  Handouts given during visit include:  The Plate Method  Meal Plan Card  Barriers to learning/adherence to lifestyle change: none  Demonstrated degree of understanding via:  Teach Back   Monitoring/Evaluation:  Dietary intake, exercise, meal planning,, and body weight in 1 month(s).

## 2017-01-14 ENCOUNTER — Ambulatory Visit: Payer: 59 | Admitting: Nutrition

## 2017-02-03 ENCOUNTER — Encounter: Payer: 59 | Attending: Obstetrics and Gynecology | Admitting: Nutrition

## 2017-02-03 NOTE — Patient Instructions (Addendum)
Goals 1. Keep food journal 2. Eat meals on times and not skip meals 3. Drink a gallon of water a day 4, Keep exercising 60 minutes 4-5 times per week. Healthy Choice or Smart Ones TV as a meal could be an option or Slim fast .

## 2017-02-03 NOTE — Progress Notes (Signed)
  Medical Nutrition Therapy:  Appt start time: 1530 end time:  1600  Assessment:  Primary concerns today: Obesity, s/p breast cancer.  Wt the same. Has been working on timing of meals, watching portio sizes. Has just started working on exercise 20 minutes about 4-5 times per week. Is not longer getting headaches anymore. Denies having Type 2 DM. Eats 2- times per week out instead of daily now. Admits she needs to be more consistent with changes and work on emotional eating issues and increase exercise and intensity.    Wt Readings from Last 3 Encounters:  02/03/17 (!) 301 lb (136.5 kg)  12/08/16 (!) 301 lb (136.5 kg)  11/04/16 299 lb 14.4 oz (136 kg)   Ht Readings from Last 3 Encounters:  12/08/16 5\' 8"  (1.727 m)  11/04/16 5\' 9"  (1.753 m)  05/08/16 5\' 9"  (1.753 m)   Body mass index is 45.77 kg/m. CMP Latest Ref Rng & Units 11/04/2016 11/05/2015 08/07/2015  Glucose 70 - 140 mg/dl 88 84 84  BUN 7.0 - 26.0 mg/dL 17.6 13.3 16.2  Creatinine 0.6 - 1.1 mg/dL 1.1 0.8 0.9  Sodium 136 - 145 mEq/L 140 140 138  Potassium 3.5 - 5.1 mEq/L 3.8 3.6 3.5  Chloride 101 - 111 mmol/L - - -  CO2 22 - 29 mEq/L 27 26 26   Calcium 8.4 - 10.4 mg/dL 9.6 10.0 10.7(H)  Total Protein 6.4 - 8.3 g/dL 7.5 7.5 8.1  Total Bilirubin 0.20 - 1.20 mg/dL 0.31 0.36 0.37  Alkaline Phos 40 - 150 U/L 87 83 75  AST 5 - 34 U/L 20 20 23   ALT 0 - 55 U/L 15 13 15     Preferred Learning Style:   No preference indicated   Learning Readiness:  Ready  Change in progress   MEDICATIONS:   DIETARY INTAKE:  24-hr recall:  B ( AM): kashi plant based cereal   Snk ( AM):  L ( PM): skipped.  Snk ( PM):  D ( PM): 2 Grilled tofu pieces, cauliflower, peas,  Unswt tea plain Snk ( PM):  Beverages: Water, hot tea and Zevia   Usual physical activity:  1 a week.  Estimated energy needs: 1500  calories 170 g carbohydrates 112 g protein 42 g fat  Progress Towards Goal(s):  In progress.   Nutritional Diagnosis:  NB-1.1  Food and nutrition-related knowledge deficit As related to OBesity .  As evidenced by BMI > 40.    Intervention:  My Plate, portion sizes, meal planning, healthy weight loss tips, benefits of exercise, nutrient dense foods, water intake, emotional eating. High fiber foods. Avoiding snacks. Benefits of weight loss s/p breast cancer. Encouraged to follow a plant based diet.   Goals 1 Exercise 30 minutes 4 times per week 2. Increase fresh fruits and vegetables 3. Don't skip meals 4. Drink only water Lose 1-2 lbs per week   Teaching Method Utilized:  Visual Auditory Hands on  Handouts given during visit include:  The Plate Method  Meal Plan Card  Barriers to learning/adherence to lifestyle change: none  Demonstrated degree of understanding via:  Teach Back   Monitoring/Evaluation:  Dietary intake, exercise, meal planning,, and body weight in 1 month(s).

## 2017-04-28 ENCOUNTER — Other Ambulatory Visit: Payer: Self-pay | Admitting: Oncology

## 2017-05-05 ENCOUNTER — Ambulatory Visit: Payer: 59 | Admitting: Nutrition

## 2017-05-26 ENCOUNTER — Other Ambulatory Visit: Payer: Self-pay | Admitting: Oncology

## 2017-06-09 ENCOUNTER — Encounter (INDEPENDENT_AMBULATORY_CARE_PROVIDER_SITE_OTHER): Payer: Self-pay

## 2017-06-24 ENCOUNTER — Ambulatory Visit (INDEPENDENT_AMBULATORY_CARE_PROVIDER_SITE_OTHER): Payer: 59 | Admitting: Family Medicine

## 2017-06-24 ENCOUNTER — Encounter (INDEPENDENT_AMBULATORY_CARE_PROVIDER_SITE_OTHER): Payer: Self-pay | Admitting: Family Medicine

## 2017-06-24 VITALS — BP 172/111 | HR 77 | Temp 97.9°F | Ht 69.0 in | Wt 294.0 lb

## 2017-06-24 DIAGNOSIS — E559 Vitamin D deficiency, unspecified: Secondary | ICD-10-CM | POA: Diagnosis not present

## 2017-06-24 DIAGNOSIS — Z9189 Other specified personal risk factors, not elsewhere classified: Secondary | ICD-10-CM | POA: Diagnosis not present

## 2017-06-24 DIAGNOSIS — R0602 Shortness of breath: Secondary | ICD-10-CM | POA: Insufficient documentation

## 2017-06-24 DIAGNOSIS — I10 Essential (primary) hypertension: Secondary | ICD-10-CM

## 2017-06-24 DIAGNOSIS — R5383 Other fatigue: Secondary | ICD-10-CM

## 2017-06-24 DIAGNOSIS — Z0289 Encounter for other administrative examinations: Secondary | ICD-10-CM

## 2017-06-24 DIAGNOSIS — Z6841 Body Mass Index (BMI) 40.0 and over, adult: Secondary | ICD-10-CM

## 2017-06-24 DIAGNOSIS — Z1331 Encounter for screening for depression: Secondary | ICD-10-CM | POA: Diagnosis not present

## 2017-06-25 LAB — VITAMIN B12: Vitamin B-12: 382 pg/mL (ref 232–1245)

## 2017-06-25 LAB — VITAMIN D 25 HYDROXY (VIT D DEFICIENCY, FRACTURES): Vit D, 25-Hydroxy: 63.3 ng/mL (ref 30.0–100.0)

## 2017-06-25 LAB — CBC WITH DIFFERENTIAL
BASOS ABS: 0 10*3/uL (ref 0.0–0.2)
BASOS: 0 %
EOS (ABSOLUTE): 0.1 10*3/uL (ref 0.0–0.4)
Eos: 1 %
Hematocrit: 43.5 % (ref 34.0–46.6)
Hemoglobin: 13.8 g/dL (ref 11.1–15.9)
IMMATURE GRANS (ABS): 0 10*3/uL (ref 0.0–0.1)
Immature Granulocytes: 0 %
LYMPHS: 40 %
Lymphocytes Absolute: 2.5 10*3/uL (ref 0.7–3.1)
MCH: 27.2 pg (ref 26.6–33.0)
MCHC: 31.7 g/dL (ref 31.5–35.7)
MCV: 86 fL (ref 79–97)
Monocytes Absolute: 0.2 10*3/uL (ref 0.1–0.9)
Monocytes: 3 %
NEUTROS PCT: 56 %
Neutrophils Absolute: 3.5 10*3/uL (ref 1.4–7.0)
RBC: 5.08 x10E6/uL (ref 3.77–5.28)
RDW: 14.8 % (ref 12.3–15.4)
WBC: 6.3 10*3/uL (ref 3.4–10.8)

## 2017-06-25 LAB — COMPREHENSIVE METABOLIC PANEL
ALT: 13 IU/L (ref 0–32)
AST: 15 IU/L (ref 0–40)
Albumin/Globulin Ratio: 1.5 (ref 1.2–2.2)
Albumin: 4.4 g/dL (ref 3.5–5.5)
Alkaline Phosphatase: 85 IU/L (ref 39–117)
BUN/Creatinine Ratio: 16 (ref 9–23)
BUN: 15 mg/dL (ref 6–24)
Bilirubin Total: 0.3 mg/dL (ref 0.0–1.2)
CO2: 23 mmol/L (ref 20–29)
Calcium: 9.9 mg/dL (ref 8.7–10.2)
Chloride: 102 mmol/L (ref 96–106)
Creatinine, Ser: 0.94 mg/dL (ref 0.57–1.00)
GFR, EST AFRICAN AMERICAN: 86 mL/min/{1.73_m2} (ref >59)
GFR, EST NON AFRICAN AMERICAN: 75 mL/min/{1.73_m2} (ref >59)
GLUCOSE: 82 mg/dL (ref 65–99)
Globulin, Total: 3 g/dL (ref 1.5–4.5)
Potassium: 4 mmol/L (ref 3.5–5.2)
Sodium: 141 mmol/L (ref 134–144)
TOTAL PROTEIN: 7.4 g/dL (ref 6.0–8.5)

## 2017-06-25 LAB — FOLATE: Folate: 9.4 ng/mL (ref >3.0)

## 2017-06-25 LAB — TSH: TSH: 2.03 u[IU]/mL (ref 0.450–4.500)

## 2017-06-25 LAB — LIPID PANEL WITH LDL/HDL RATIO
Cholesterol, Total: 128 mg/dL (ref 100–199)
HDL: 57 mg/dL (ref >39)
LDL Calculated: 55 mg/dL (ref 0–99)
LDl/HDL Ratio: 1 ratio (ref 0.0–3.2)
TRIGLYCERIDES: 80 mg/dL (ref 0–149)
VLDL Cholesterol Cal: 16 mg/dL (ref 5–40)

## 2017-06-25 LAB — T4, FREE: Free T4: 1.23 ng/dL (ref 0.82–1.77)

## 2017-06-25 LAB — T3: T3, Total: 159 ng/dL (ref 71–180)

## 2017-06-25 LAB — HEMOGLOBIN A1C
ESTIMATED AVERAGE GLUCOSE: 105 mg/dL
Hgb A1c MFr Bld: 5.3 % (ref 4.8–5.6)

## 2017-06-25 LAB — INSULIN, RANDOM: INSULIN: 6.2 u[IU]/mL (ref 2.6–24.9)

## 2017-06-28 ENCOUNTER — Encounter (INDEPENDENT_AMBULATORY_CARE_PROVIDER_SITE_OTHER): Payer: Self-pay | Admitting: Family Medicine

## 2017-06-28 NOTE — Progress Notes (Signed)
Office: [phone]  /  Fax: [phone]   Dear Dr. Hilma Favors,   Thank you for referring Jennifer Frazier to our clinic. The following note includes my evaluation and treatment recommendations.  HPI:   Chief Complaint: OBESITY    Jennifer Frazier has been referred by Sharilyn Sites, MD for consultation regarding her obesity and obesity related comorbidities.    Jennifer Frazier (MR# [id number]) is a 42 y.o. female who presents on 06/24/2017 for obesity evaluation and treatment. Current BMI is Body mass index is 43.42 kg/m.Jennifer Frazier Jennifer Frazier has been struggling with her weight for many years and has been unsuccessful in either losing weight, maintaining weight loss, or reaching her healthy weight goal.     [name] attended our information session and states she is currently in the action stage of change and ready to dedicate time achieving and maintaining a healthier weight. Jennifer Frazier is interested in becoming our patient and working on intensive lifestyle modifications including (but not limited to) diet, exercise and weight loss.    Jennifer Frazier states her family eats meals together she thinks her family will eat healthier with  her her desired weight loss is 114-119 lbs she has been heavy most of  her life she started gaining weight between 11th and 12th grade her heaviest weight ever was 294 lbs she is a picky eater and doesn't like to eat healthier foods  she has significant food cravings issues  she skips meals frequently she is frequently drinking liquids with calories she frequently makes poor food choices she struggles with emotional eating    Jennifer Frazier feels her energy is lower than it should be. This has worsened with weight gain and has not worsened recently. Jennifer Frazier admits to daytime somnolence and  admits to waking up still tired. Patient is at risk for obstructive sleep apnea. Patent has a history of symptoms of daytime Jennifer. Patient generally gets 4 hours of sleep per night, and states they  generally have nightime awakenings. Snoring is present. Apneic episodes are not present. Epworth Sleepiness Score is 3.  Dyspnea on exertion Jennifer Frazier notes increasing shortness of breath with exercising and seems to be worsening over time with weight gain. She notes getting out of breath sooner with activity than she used to. This has not gotten worse recently. EKG-low voltage, normal sinus rhythm. Jennifer Frazier denies orthopnea.  Hypertension Jennifer Frazier is a 42 y.o. female with hypertension. Jennifer Frazier's blood pressure is very elevated today. She denies chest pain, chest pressure, or headache. She just started on losartan 4 weeks ago. She is working weight loss to help control her blood pressure with the goal of decreasing her risk of heart attack and stroke. Jennifer Frazier's blood pressure is not currently controlled.  At risk for cardiovascular disease Jennifer Frazier is at a higher than average risk for cardiovascular disease due to obesity and hypertension. She currently denies any chest pain.  Vitamin D Deficiency Jennifer Frazier has a diagnosis of vitamin D deficiency. She has a historical diagnosis, she is not on Vit D supplementation currently. She denies nausea, vomiting or muscle weakness.  Depression Screen Jennifer Frazier's Food and Mood (modified PHQ-9) score was  Depression screen PHQ 2/9 06/24/2017  Decreased Interest 1  Down, Depressed, Hopeless 1  PHQ - 2 Score 2  Altered sleeping 0  Tired, decreased energy 0  Change in appetite 1  Feeling bad or failure about yourself  0  Trouble concentrating 0  Moving slowly or fidgety/restless 0  Suicidal thoughts 0  PHQ-9 Score 3  Difficult doing work/chores Not difficult at all    ALLERGIES: Allergies  Allergen Reactions  . Gadolinium      Desc: NAUSEA WITH MRI CONTRAST-NO VOMITING   . Tramadol Hives and Nausea Only    MEDICATIONS: Current Outpatient Medications on File Prior to Visit  Medication Sig Dispense Refill  . calcium carbonate (TUMS - DOSED IN MG ELEMENTAL  CALCIUM) 500 MG chewable tablet Chew 1 tablet by mouth daily as needed for indigestion or heartburn.    . diphenhydrAMINE (BENADRYL) 25 MG tablet Take 25 mg by mouth every 6 (six) hours as needed.    Jennifer Frazier losartan (COZAAR) 25 MG tablet Take 25 mg by mouth daily.    Jennifer Frazier OVER THE COUNTER MEDICATION Dim  150 mg twice a day    . tamoxifen (NOLVADEX) 20 MG tablet TAKE 1 TABLET BY MOUTH EVERY DAY 30 tablet 0   No current facility-administered medications on file prior to visit.     PAST MEDICAL HISTORY: Past Medical History:  Diagnosis Date  . Anemia   . Anxiety   . Cancer ([place])    Breast  . Contact lens/glasses fitting    wears contacts or glasses  . Depression   . Fibroids    uterine fibroids  . Hypertension   . Hypothyroidism   . Obesity   . Personal history of chemotherapy   . Personal history of radiation therapy   . Radiation 04/04/14-05/21/14   Right upper-outer breast  . Sleep apnea    has a cpap-does not always use it  . Thyroid disease     PAST SURGICAL HISTORY: Past Surgical History:  Procedure Laterality Date  . BREAST LUMPECTOMY Right 08/03/2013  . BREAST SURGERY     lumpectomy 7/14  . EYE SURGERY  2013   retinal tear-lazer-both  . PORT A CATH REVISION N/A 12/19/2013   Procedure: REPOSITION PORT;  Surgeon: [name], MD;  Location: [facility];  Service: General;  Laterality: N/A;  . PORT-A-CATH REMOVAL Right 08/08/2014   Procedure: REMOVAL PORT-A-CATH;  Surgeon: [name], MD;  Location: [facility];  Service: General;  Laterality: Right;  . PORTACATH PLACEMENT Right 09/29/2013   Procedure: INSERTION PORT-A-CATH WITH ULTRA SOUND;  Surgeon: [name], MD;  Location: [facility];  Service: General;  Laterality: Right;  . [place]  2012   urerine ablasion  . WISDOM TOOTH EXTRACTION      SOCIAL HISTORY: Social History   Tobacco Use  . Smoking status: Never Smoker  . Smokeless tobacco: Never Used  Substance Use  Topics  . Alcohol use: No  . Drug use: No    FAMILY HISTORY: Family History  Problem Relation Age of Onset  . Cancer Father 103       colon  . High blood pressure Mother   . Stroke Mother   . Anxiety disorder Mother   . Sleep apnea Mother   . Obesity Mother   . Cancer Maternal Aunt 50       breast cancer  . Cancer Paternal Aunt 90       breast cancer  . Cancer Maternal Grandmother 87       ovarian or uterine cancer  . Cancer Paternal Grandmother        unknown primary    ROS: Review of Systems  Constitutional: Positive for malaise/Jennifer. Negative for weight loss.       + Trouble sleeping  Eyes:       + Wear glasses  or contacts  Respiratory: Positive for shortness of breath (with exertion).   Cardiovascular: Negative for chest pain and orthopnea.       Negative chest pressure  Gastrointestinal: Positive for heartburn. Negative for nausea and vomiting.  Genitourinary: Positive for frequency (sometimes at night).  Musculoskeletal:       Negative muscle weakness  Skin: Positive for itching.  Neurological: Negative for headaches.  Endo/Heme/Allergies:       + Heat intolerance  Psychiatric/Behavioral:       + Stress    PHYSICAL EXAM: Blood pressure (!) 172/111, pulse 77, temperature 97.9 F (36.6 C), temperature source Oral, height 5\' 9"  (1.753 m), weight 294 lb (133.4 kg), SpO2 99 %. Body mass index is 43.42 kg/m. Physical Exam  Constitutional: She is oriented to person, place, and time. She appears well-developed and well-nourished.  HENT:  Head: Normocephalic and atraumatic.  Nose: Nose normal.  Eyes: EOM are normal. No scleral icterus.  Neck: Normal range of motion. Neck supple. No thyromegaly present.  Cardiovascular: Normal rate and regular rhythm.  Pulmonary/Chest: Effort normal. No respiratory distress.  Abdominal: Soft. There is no tenderness.  + Obesity  Musculoskeletal:  Range of Motion normal in all 4 extremities Trace edema noted in bilateral  lower extremities  Neurological: She is alert and oriented to person, place, and time. Coordination normal.  Skin: Skin is warm and dry.  Psychiatric: She has a normal mood and affect. Her behavior is normal.  Vitals reviewed.   RECENT LABS AND TESTS: BMET    Component Value Date/Time   NA 141 06/24/2017 1236   NA 140 11/04/2016 1500   K 4.0 06/24/2017 1236   K 3.8 11/04/2016 1500   CL 102 06/24/2017 1236   CO2 23 06/24/2017 1236   CO2 27 11/04/2016 1500   GLUCOSE 82 06/24/2017 1236   GLUCOSE 88 11/04/2016 1500   BUN 15 06/24/2017 1236   BUN 17.6 11/04/2016 1500   CREATININE 0.94 06/24/2017 1236   CREATININE 1.1 11/04/2016 1500   CALCIUM 9.9 06/24/2017 1236   CALCIUM 9.6 11/04/2016 1500   GFRNONAA 75 06/24/2017 1236   GFRAA 86 06/24/2017 1236   Lab Results  Component Value Date   HGBA1C 5.3 06/24/2017   Lab Results  Component Value Date   INSULIN 6.2 06/24/2017   CBC    Component Value Date/Time   WBC 6.3 06/24/2017 1236   WBC 5.7 11/04/2016 1500   WBC 4.8 08/07/2014 1325   RBC 5.08 06/24/2017 1236   RBC 4.80 11/04/2016 1500   RBC 5.34 (H) 08/07/2014 1325   HGB 13.8 06/24/2017 1236   HGB 13.3 11/04/2016 1500   HCT 43.5 06/24/2017 1236   HCT 40.6 11/04/2016 1500   PLT 208 11/04/2016 1500   MCV 86 06/24/2017 1236   MCV 84.4 11/04/2016 1500   MCH 27.2 06/24/2017 1236   MCH 27.7 11/04/2016 1500   MCH 27.5 08/07/2014 1325   MCHC 31.7 06/24/2017 1236   MCHC 32.9 11/04/2016 1500   MCHC 34.0 08/07/2014 1325   RDW 14.8 06/24/2017 1236   RDW 13.9 11/04/2016 1500   LYMPHSABS 2.5 06/24/2017 1236   LYMPHSABS 2.0 11/04/2016 1500   MONOABS 0.3 11/04/2016 1500   EOSABS 0.1 06/24/2017 1236   BASOSABS 0.0 06/24/2017 1236   BASOSABS 0.0 11/04/2016 1500   Iron/TIBC/Ferritin/ %Sat No results found for: IRON, TIBC, FERRITIN, IRONPCTSAT Lipid Panel     Component Value Date/Time   CHOL 128 06/24/2017 1236   TRIG 80 06/24/2017 1236  HDL 57 06/24/2017 1236   LDLCALC  55 06/24/2017 1236   Hepatic Function Panel     Component Value Date/Time   PROT 7.4 06/24/2017 1236   PROT 7.5 11/04/2016 1500   ALBUMIN 4.4 06/24/2017 1236   ALBUMIN 3.8 11/04/2016 1500   AST 15 06/24/2017 1236   AST 20 11/04/2016 1500   ALT 13 06/24/2017 1236   ALT 15 11/04/2016 1500   ALKPHOS 85 06/24/2017 1236   ALKPHOS 87 11/04/2016 1500   BILITOT 0.3 06/24/2017 1236   BILITOT 0.31 11/04/2016 1500      Component Value Date/Time   TSH 2.030 06/24/2017 1236   TSH 0.129 (L) 08/28/2014 1620    ECG  shows NSR with a rate of 76 BPM INDIRECT CALORIMETER done today shows a VO2 of 184 and a REE of 1284.  Her calculated basal metabolic rate is 9381 thus her basal metabolic rate is worse than expected.    ASSESSMENT AND PLAN: Other Jennifer - Plan: EKG 12-Lead, Vitamin B12, CBC With Differential, Comprehensive metabolic panel, Folate, Hemoglobin A1c, Insulin, random, Lipid Panel With LDL/HDL Ratio, T3, T4, free, TSH  Shortness of breath on exertion  Essential hypertension - Plan: Comprehensive metabolic panel  Vitamin D deficiency - Plan: VITAMIN D 25 Hydroxy (Vit-D Deficiency, Fractures)  Depression screening  At risk for heart disease  Class 3 severe obesity with serious comorbidity and body mass index (BMI) of 40.0 to 44.9 in adult, unspecified obesity type (HCC)  PLAN:  Jennifer Kaiyla was informed that her Jennifer may be related to obesity, depression or many other causes. Labs will be ordered, and in the meanwhile Kennedy has agreed to work on diet, exercise and weight loss to help with Jennifer. Proper sleep hygiene was discussed including the need for 7-8 hours of quality sleep each night. A sleep study was not ordered based on symptoms and Epworth score.  Dyspnea on exertion Phala's shortness of breath appears to be obesity related and exercise induced. She has agreed to work on weight loss and gradually increase exercise to treat her exercise induced shortness of  breath. If Mckenze follows our instructions and loses weight without improvement of her shortness of breath, we will plan to refer to pulmonology. We will monitor this condition regularly. Abeera agrees to this plan.  Hypertension We discussed sodium restriction, working on healthy weight loss, and a regular exercise program as the means to achieve improved blood pressure control. Avira agreed with this plan and agreed to follow up as directed. We will continue to monitor her blood pressure as well as her progress with the above lifestyle modifications. She will continue her medications as prescribed and will watch for signs of hypotension as she continues her lifestyle modifications. We will check labs today and Suhani agrees to follow up with our clinic in 2 weeks and we will recheck blood pressure at that time.  Cardiovascular risk counselling Soriah was given extended (15 minutes) coronary artery disease prevention counseling today. She is 42 y.o. female and has risk factors for heart disease including obesity and hypertension. We discussed intensive lifestyle modifications today with an emphasis on specific weight loss instructions and strategies. Pt was also informed of the importance of increasing exercise and decreasing saturated fats to help prevent heart disease.  Vitamin D Deficiency Kelse was informed that low vitamin D levels contributes to Jennifer and are associated with obesity, breast, and colon cancer. She will follow up for routine testing of vitamin D, at least 2-3 times  per year. She was informed of the risk of over-replacement of vitamin D and agrees to not increase her dose unless she discusses this with [name] first. We will check Vit D level today and Kalinda agrees to follow up with our clinic in 2 weeks.  Depression Screen Laquetta had a negative depression screening. Depression is commonly associated with obesity and often results in emotional eating behaviors. We will monitor this closely  and work on CBT to help improve the non-hunger eating patterns. Referral to Psychology may be required if no improvement is seen as she continues in our clinic.  Obesity Chesnee is currently in the action stage of change and her goal is to continue with weight loss efforts. I recommend Teegan begin the structured treatment plan as follows:  She has agreed to follow our protein rich vegetarian plan + 100 calories as needed for snack, and chicken nuggets grilled wrap when visiting [employer] at supper Felise has been instructed to eventually work up to a goal of 150 minutes of combined cardio and strengthening exercise per week for weight loss and overall health benefits. We discussed the following Behavioral Modification Strategies today: increasing lean protein intake, increasing vegetables, work on meal planning and easy cooking plans, and planning for success   She was informed of the importance of frequent follow up visits to maximize her success with intensive lifestyle modifications for her multiple health conditions. She was informed we would discuss her lab results at her next visit unless there is a critical issue that needs to be addressed sooner. Mychaela agreed to keep her next visit at the agreed upon time to discuss these results.    OBESITY BEHAVIORAL INTERVENTION VISIT  Today's visit was # 1 out of 22.  Starting weight: 294 lbs Starting date: 06/24/17 Today's weight : 294 lbs  Today's date: 06/24/2017 Total lbs lost to date: 0 (Patients must lose 7 lbs in the first 6 months to continue with counseling)   ASK: We discussed the diagnosis of obesity with Ernst Breach today and Kamarah agreed to give [name] permission to discuss obesity behavioral modification therapy today.  ASSESS: Yalda has the diagnosis of obesity and her BMI today is 73.4 Jyasia is in the action stage of change   ADVISE: Tora was educated on the multiple health risks of obesity as well as the benefit of weight  loss to improve her health. She was advised of the need for long term treatment and the importance of lifestyle modifications.  AGREE: Multiple dietary modification options and treatment options were discussed and  Calleen agreed to the above obesity treatment plan.   I, Yehuda Savannah, am acting as transcriptionist for Ilene Qua, MD  I have reviewed the above documentation for accuracy and completeness, and I agree with the above. - Ilene Qua, MD

## 2017-07-08 ENCOUNTER — Ambulatory Visit (INDEPENDENT_AMBULATORY_CARE_PROVIDER_SITE_OTHER): Payer: 59 | Admitting: Family Medicine

## 2017-07-08 VITALS — BP 171/99 | HR 65 | Temp 98.3°F | Ht 69.0 in | Wt 292.0 lb

## 2017-07-08 DIAGNOSIS — I10 Essential (primary) hypertension: Secondary | ICD-10-CM | POA: Diagnosis not present

## 2017-07-08 DIAGNOSIS — Z9189 Other specified personal risk factors, not elsewhere classified: Secondary | ICD-10-CM

## 2017-07-08 DIAGNOSIS — Z6841 Body Mass Index (BMI) 40.0 and over, adult: Secondary | ICD-10-CM | POA: Diagnosis not present

## 2017-07-08 MED ORDER — AMLODIPINE BESYLATE 5 MG PO TABS: 5.0000 mg | ORAL_TABLET | Freq: Every day | ORAL | 0 refills | Status: DC

## 2017-07-08 NOTE — Progress Notes (Signed)
Office: [phone]  /  Fax: [phone]   HPI:   Chief Complaint: OBESITY Jennifer Frazier is here to discuss her progress with her obesity treatment plan. She is on the protein rich vegetarian plan and is following her eating plan approximately 100 % of the time. She states she is exercising 0 minutes 0 times per week. Jennifer Frazier followed the plan 100%. Jennifer Frazier started seeping in over the past few days. She reports hunger when waking up in the morning. Her weight is 292 lb (132.5 kg) today and has had a weight loss of 2 pounds over a period of 2 weeks since her last visit. She has lost 2 lbs since starting treatment with [name].  Hypertension Jennifer Frazier is a 42 y.o. female with hypertension. Her blood pressure is significantly elevated today at 171/99. She is on losartan 25 mg daily (previously on lisinopril , but she stopped secondary to fatigue). Jennifer Frazier denies chest pain or shortness of breath on exertion. She is working weight loss to help control her blood pressure with the goal of decreasing her risk of heart attack and stroke. Jennifer Frazier blood pressure is not currently controlled.  At risk for cardiovascular disease Jennifer Frazier is at a higher than average risk for cardiovascular disease due to obesity and hypertension. She currently denies any chest pain.  ALLERGIES: Allergies  Allergen Reactions  . Gadolinium      Desc: NAUSEA WITH MRI CONTRAST-NO VOMITING   . Tramadol Hives and Nausea Only    MEDICATIONS: Current Outpatient Medications on File Prior to Visit  Medication Sig Dispense Refill  . calcium carbonate (TUMS - DOSED IN MG ELEMENTAL CALCIUM) 500 MG chewable tablet Chew 1 tablet by mouth daily as needed for indigestion or heartburn.    . diphenhydrAMINE (BENADRYL) 25 MG tablet Take 25 mg by mouth every 6 (six) hours as needed.    [name] losartan (COZAAR) 25 MG tablet Take 25 mg by mouth daily.    [name] OVER THE COUNTER MEDICATION Dim  150 mg twice a day    . tamoxifen (NOLVADEX) 20 MG  tablet TAKE 1 TABLET BY MOUTH EVERY DAY 30 tablet 0   No current facility-administered medications on file prior to visit.     PAST MEDICAL HISTORY: Past Medical History:  Diagnosis Date  . Anemia   . Anxiety   . Cancer ([place])    Breast  . Contact lens/glasses fitting    wears contacts or glasses  . Depression   . Fibroids    uterine fibroids  . Hypertension   . Hypothyroidism   . Obesity   . Personal history of chemotherapy   . Personal history of radiation therapy   . Radiation 04/04/14-05/21/14   Right upper-outer breast  . Sleep apnea    has a cpap-does not always use it  . Thyroid disease     PAST SURGICAL HISTORY: Past Surgical History:  Procedure Laterality Date  . BREAST LUMPECTOMY Right 08/03/2013  . BREAST SURGERY     lumpectomy 7/14  . EYE SURGERY  2013   retinal tear-lazer-both  . PORT A CATH REVISION N/A 12/19/2013   Procedure: REPOSITION PORT;  Surgeon: [name], MD;  Location: [facility];  Service: General;  Laterality: N/A;  . PORT-A-CATH REMOVAL Right 08/08/2014   Procedure: REMOVAL PORT-A-CATH;  Surgeon: [name], MD;  Location: [facility];  Service: General;  Laterality: Right;  . PORTACATH PLACEMENT Right 09/29/2013   Procedure: INSERTION PORT-A-CATH WITH ULTRA SOUND;  Surgeon: [name], MD;  Location: [facility];  Service: General;  Laterality: Right;  . [place]  2012   urerine ablasion  . WISDOM TOOTH EXTRACTION      SOCIAL HISTORY: Social History   Tobacco Use  . Smoking status: Never Smoker  . Smokeless tobacco: Never Used  Substance Use Topics  . Alcohol use: No  . Drug use: No    FAMILY HISTORY: Family History  Problem Relation Age of Onset  . Cancer Father 19       colon  . High blood pressure Mother   . Stroke Mother   . Anxiety disorder Mother   . Sleep apnea Mother   . Obesity Mother   . Cancer Maternal Aunt 50       breast cancer  . Cancer Paternal Aunt 66        breast cancer  . Cancer Maternal Grandmother 55       ovarian or uterine cancer  . Cancer Paternal Grandmother        unknown primary    ROS: Review of Systems  Constitutional: Positive for weight loss.  Respiratory: Negative for shortness of breath (on exertion).   Cardiovascular: Negative for chest pain.    PHYSICAL EXAM: Blood pressure (!) 171/99, pulse 65, temperature 98.3 F (36.8 C), temperature source Oral, height 5\' 9"  (1.753 m), weight 292 lb (132.5 kg), SpO2 98 %. Body mass index is 43.12 kg/m. Physical Exam  Constitutional: She is oriented to person, place, and time. She appears well-developed and well-nourished.  Cardiovascular: Normal rate.  Pulmonary/Chest: Effort normal.  Musculoskeletal: Normal range of motion.  Neurological: She is oriented to person, place, and time.  Skin: Skin is warm and dry.  Psychiatric: She has a normal mood and affect. Her behavior is normal.  Vitals reviewed.   RECENT LABS AND TESTS: BMET    Component Value Date/Time   NA 141 06/24/2017 1236   NA 140 11/04/2016 1500   K 4.0 06/24/2017 1236   K 3.8 11/04/2016 1500   CL 102 06/24/2017 1236   CO2 23 06/24/2017 1236   CO2 27 11/04/2016 1500   GLUCOSE 82 06/24/2017 1236   GLUCOSE 88 11/04/2016 1500   BUN 15 06/24/2017 1236   BUN 17.6 11/04/2016 1500   CREATININE 0.94 06/24/2017 1236   CREATININE 1.1 11/04/2016 1500   CALCIUM 9.9 06/24/2017 1236   CALCIUM 9.6 11/04/2016 1500   GFRNONAA 75 06/24/2017 1236   GFRAA 86 06/24/2017 1236   Lab Results  Component Value Date   HGBA1C 5.3 06/24/2017   Lab Results  Component Value Date   INSULIN 6.2 06/24/2017   CBC    Component Value Date/Time   WBC 6.3 06/24/2017 1236   WBC 5.7 11/04/2016 1500   WBC 4.8 08/07/2014 1325   RBC 5.08 06/24/2017 1236   RBC 4.80 11/04/2016 1500   RBC 5.34 (H) 08/07/2014 1325   HGB 13.8 06/24/2017 1236   HGB 13.3 11/04/2016 1500   HCT 43.5 06/24/2017 1236   HCT 40.6 11/04/2016 1500   PLT  208 11/04/2016 1500   MCV 86 06/24/2017 1236   MCV 84.4 11/04/2016 1500   MCH 27.2 06/24/2017 1236   MCH 27.7 11/04/2016 1500   MCH 27.5 08/07/2014 1325   MCHC 31.7 06/24/2017 1236   MCHC 32.9 11/04/2016 1500   MCHC 34.0 08/07/2014 1325   RDW 14.8 06/24/2017 1236   RDW 13.9 11/04/2016 1500   LYMPHSABS 2.5 06/24/2017 1236   LYMPHSABS  2.0 11/04/2016 1500   MONOABS 0.3 11/04/2016 1500   EOSABS 0.1 06/24/2017 1236   BASOSABS 0.0 06/24/2017 1236   BASOSABS 0.0 11/04/2016 1500   Iron/TIBC/Ferritin/ %Sat No results found for: IRON, TIBC, FERRITIN, IRONPCTSAT Lipid Panel     Component Value Date/Time   CHOL 128 06/24/2017 1236   TRIG 80 06/24/2017 1236   HDL 57 06/24/2017 1236   LDLCALC 55 06/24/2017 1236   Hepatic Function Panel     Component Value Date/Time   PROT 7.4 06/24/2017 1236   PROT 7.5 11/04/2016 1500   ALBUMIN 4.4 06/24/2017 1236   ALBUMIN 3.8 11/04/2016 1500   AST 15 06/24/2017 1236   AST 20 11/04/2016 1500   ALT 13 06/24/2017 1236   ALT 15 11/04/2016 1500   ALKPHOS 85 06/24/2017 1236   ALKPHOS 87 11/04/2016 1500   BILITOT 0.3 06/24/2017 1236   BILITOT 0.31 11/04/2016 1500      Component Value Date/Time   TSH 2.030 06/24/2017 1236   TSH 0.129 (L) 08/28/2014 1620   Results for LEON, GOODNOW (MRN [id number]) as of 07/08/2017 16:32  Ref. Range 06/24/2017 12:36  Vitamin D, 25-Hydroxy Latest Ref Range: 30.0 - 100.0 ng/mL 63.3   ASSESSMENT AND PLAN: Essential hypertension - Plan: amLODipine (NORVASC) 5 MG tablet  At risk for heart disease  Class 3 severe obesity with serious comorbidity and body mass index (BMI) of 40.0 to 44.9 in adult, unspecified obesity type ([place])  PLAN:  Hypertension We discussed sodium restriction, working on healthy weight loss, and a regular exercise program as the means to achieve improved blood pressure control. Glennda agreed with this plan and agreed to follow up as directed. We will continue to monitor her blood pressure as well  as her progress with the above lifestyle modifications. She agreed to continue amlodipine 5 mg daily #30 with no refills and will watch for signs of hypotension as she continues her lifestyle modifications.  Cardiovascular risk counseling Jennifer Frazier was given extended (15 minutes) coronary artery disease prevention counseling today. She is 42 y.o. female and has risk factors for heart disease including obesity and hypertension. We discussed intensive lifestyle modifications today with an emphasis on specific weight loss instructions and strategies. Pt was also informed of the importance of increasing exercise and decreasing saturated fats to help prevent heart disease.  Obesity Jennifer Frazier is currently in the action stage of change. As such, her goal is to continue with weight loss efforts She has agreed to follow our protein rich vegetarian plan Jennifer Frazier has been instructed to work up to a goal of 150 minutes of combined cardio and strengthening exercise per week for weight loss and overall health benefits. We discussed the following Behavioral Modification Strategies today: better snacking choices, planning for success, increasing lean protein intake, increasing vegetables and work on meal planning and easy cooking plans  Jennifer Frazier has agreed to follow up with our clinic in 2 weeks. She was informed of the importance of frequent follow up visits to maximize her success with intensive lifestyle modifications for her multiple health conditions.   OBESITY BEHAVIORAL INTERVENTION VISIT  Today's visit was # 2 out of 22.  Starting weight: 294 lbs Starting date: 06/24/17 Today's weight : 292 lbs Today's date: 07/08/2017 Total lbs lost to date: 2 (Patients must lose 7 lbs in the first 6 months to continue with counseling)   ASK: We discussed the diagnosis of obesity with Jennifer Frazier today and Chavon agreed to give [name] permission to discuss  obesity behavioral modification therapy today.  ASSESS: Jennifer Frazier has the  diagnosis of obesity and her BMI today is 43.1 Jennifer Frazier is in the action stage of change   ADVISE: Jennifer Frazier was educated on the multiple health risks of obesity as well as the benefit of weight loss to improve her health. She was advised of the need for long term treatment and the importance of lifestyle modifications.  AGREE: Multiple dietary modification options and treatment options were discussed and  Jennifer Frazier agreed to the above obesity treatment plan.  I, Jennifer Frazier, am acting as transcriptionist for Eber Jones, MD  I have reviewed the above documentation for accuracy and completeness, and I agree with the above. - Jennifer Qua, MD

## 2017-07-12 ENCOUNTER — Encounter (INDEPENDENT_AMBULATORY_CARE_PROVIDER_SITE_OTHER): Payer: Self-pay | Admitting: Family Medicine

## 2017-07-22 ENCOUNTER — Ambulatory Visit (INDEPENDENT_AMBULATORY_CARE_PROVIDER_SITE_OTHER): Payer: 59 | Admitting: Family Medicine

## 2017-07-22 VITALS — BP 145/99 | HR 71 | Temp 98.6°F | Ht 69.0 in | Wt 286.0 lb

## 2017-07-22 DIAGNOSIS — Z6841 Body Mass Index (BMI) 40.0 and over, adult: Secondary | ICD-10-CM

## 2017-07-22 DIAGNOSIS — I10 Essential (primary) hypertension: Secondary | ICD-10-CM

## 2017-07-26 NOTE — Progress Notes (Signed)
Office: [phone]  /  Fax: [phone]   HPI:   Chief Complaint: OBESITY Jennifer Frazier is here to discuss her progress with her obesity treatment plan. She is on the protein rich vegetarian plan and is following her eating plan approximately 100 % of the time. She states she is exercising 0 minutes 0 times per week. [name] had hunger 3 days in the morning. No inappropriate hunger. Improvement in body aches and pains with meal plan.  Her weight is 286 lb (129.7 kg) today and has had a weight loss of 5 pounds over a period of 2 weeks since her last visit. She has lost 8 lbs since starting treatment with [name].  Hypertension Jennifer Frazier is a 42 y.o. female with hypertension. Jennifer Frazier's blood pressure is a little elevated today but brought blood pressure log, blood pressure range between 100-140/68-92. She denies chest pain, chest pressure, or headache. Just started on amlodipine. She is working weight loss to help control her blood pressure with the goal of decreasing her risk of heart attack and stroke. Jennifer Frazier's blood pressure is not currently controlled.  ALLERGIES: Allergies  Allergen Reactions  . Gadolinium      Desc: NAUSEA WITH MRI CONTRAST-NO VOMITING   . Tramadol Hives and Nausea Only    MEDICATIONS: Current Outpatient Medications on File Prior to Visit  Medication Sig Dispense Refill  . amLODipine (NORVASC) 5 MG tablet Take 1 tablet (5 mg total) by mouth daily. 30 tablet 0  . calcium carbonate (TUMS - DOSED IN MG ELEMENTAL CALCIUM) 500 MG chewable tablet Chew 1 tablet by mouth daily as needed for indigestion or heartburn.    . diphenhydrAMINE (BENADRYL) 25 MG tablet Take 25 mg by mouth every 6 (six) hours as needed.    [name] losartan (COZAAR) 25 MG tablet Take 25 mg by mouth daily.    [name] OVER THE COUNTER MEDICATION Dim  150 mg twice a day    . tamoxifen (NOLVADEX) 20 MG tablet TAKE 1 TABLET BY MOUTH EVERY DAY 30 tablet 0   No current facility-administered medications on file prior to  visit.     PAST MEDICAL HISTORY: Past Medical History:  Diagnosis Date  . Anemia   . Anxiety   . Cancer ([place])    Breast  . Contact lens/glasses fitting    wears contacts or glasses  . Depression   . Fibroids    uterine fibroids  . Hypertension   . Hypothyroidism   . Obesity   . Personal history of chemotherapy   . Personal history of radiation therapy   . Radiation 04/04/14-05/21/14   Right upper-outer breast  . Sleep apnea    has a cpap-does not always use it  . Thyroid disease     PAST SURGICAL HISTORY: Past Surgical History:  Procedure Laterality Date  . BREAST LUMPECTOMY Right 08/03/2013  . BREAST SURGERY     lumpectomy 7/14  . EYE SURGERY  2013   retinal tear-lazer-both  . PORT A CATH REVISION N/A 12/19/2013   Procedure: REPOSITION PORT;  Surgeon: [name], MD;  Location: [facility];  Service: General;  Laterality: N/A;  . PORT-A-CATH REMOVAL Right 08/08/2014   Procedure: REMOVAL PORT-A-CATH;  Surgeon: [name], MD;  Location: [facility];  Service: General;  Laterality: Right;  . PORTACATH PLACEMENT Right 09/29/2013   Procedure: INSERTION PORT-A-CATH WITH ULTRA SOUND;  Surgeon: [name], MD;  Location: [facility];  Service: General;  Laterality: Right;  . [place]  2012   urerine ablasion  . WISDOM TOOTH EXTRACTION      SOCIAL HISTORY: Social History   Tobacco Use  . Smoking status: Never Smoker  . Smokeless tobacco: Never Used  Substance Use Topics  . Alcohol use: No  . Drug use: No    FAMILY HISTORY: Family History  Problem Relation Age of Onset  . Cancer Father 42       colon  . High blood pressure Mother   . Stroke Mother   . Anxiety disorder Mother   . Sleep apnea Mother   . Obesity Mother   . Cancer Maternal Aunt 50       breast cancer  . Cancer Paternal Aunt 85       breast cancer  . Cancer Maternal Grandmother 55       ovarian or uterine cancer  . Cancer Paternal Grandmother         unknown primary    ROS: Review of Systems  Constitutional: Positive for weight loss.  Cardiovascular: Negative for chest pain.       Negative chest pressure  Neurological: Negative for headaches.    PHYSICAL EXAM: Blood pressure (!) 145/99, pulse 71, temperature 98.6 F (37 C), temperature source Oral, height 5\' 9"  (1.753 m), weight 286 lb (129.7 kg), SpO2 100 %. Body mass index is 42.23 kg/m. Physical Exam  Constitutional: She is oriented to person, place, and time. She appears well-developed and well-nourished.  Cardiovascular: Normal rate.  Pulmonary/Chest: Effort normal.  Musculoskeletal: Normal range of motion.  Neurological: She is oriented to person, place, and time.  Skin: Skin is warm and dry.  Psychiatric: She has a normal mood and affect. Her behavior is normal.  Vitals reviewed.   RECENT LABS AND TESTS: BMET    Component Value Date/Time   NA 141 06/24/2017 1236   NA 140 11/04/2016 1500   K 4.0 06/24/2017 1236   K 3.8 11/04/2016 1500   CL 102 06/24/2017 1236   CO2 23 06/24/2017 1236   CO2 27 11/04/2016 1500   GLUCOSE 82 06/24/2017 1236   GLUCOSE 88 11/04/2016 1500   BUN 15 06/24/2017 1236   BUN 17.6 11/04/2016 1500   CREATININE 0.94 06/24/2017 1236   CREATININE 1.1 11/04/2016 1500   CALCIUM 9.9 06/24/2017 1236   CALCIUM 9.6 11/04/2016 1500   GFRNONAA 75 06/24/2017 1236   GFRAA 86 06/24/2017 1236   Lab Results  Component Value Date   HGBA1C 5.3 06/24/2017   Lab Results  Component Value Date   INSULIN 6.2 06/24/2017   CBC    Component Value Date/Time   WBC 6.3 06/24/2017 1236   WBC 5.7 11/04/2016 1500   WBC 4.8 08/07/2014 1325   RBC 5.08 06/24/2017 1236   RBC 4.80 11/04/2016 1500   RBC 5.34 (H) 08/07/2014 1325   HGB 13.8 06/24/2017 1236   HGB 13.3 11/04/2016 1500   HCT 43.5 06/24/2017 1236   HCT 40.6 11/04/2016 1500   PLT 208 11/04/2016 1500   MCV 86 06/24/2017 1236   MCV 84.4 11/04/2016 1500   MCH 27.2 06/24/2017 1236   MCH 27.7  11/04/2016 1500   MCH 27.5 08/07/2014 1325   MCHC 31.7 06/24/2017 1236   MCHC 32.9 11/04/2016 1500   MCHC 34.0 08/07/2014 1325   RDW 14.8 06/24/2017 1236   RDW 13.9 11/04/2016 1500   LYMPHSABS 2.5 06/24/2017 1236   LYMPHSABS 2.0 11/04/2016 1500   MONOABS 0.3 11/04/2016 1500   EOSABS 0.1 06/24/2017 1236  BASOSABS 0.0 06/24/2017 1236   BASOSABS 0.0 11/04/2016 1500   Iron/TIBC/Ferritin/ %Sat No results found for: IRON, TIBC, FERRITIN, IRONPCTSAT Lipid Panel     Component Value Date/Time   CHOL 128 06/24/2017 1236   TRIG 80 06/24/2017 1236   HDL 57 06/24/2017 1236   LDLCALC 55 06/24/2017 1236   Hepatic Function Panel     Component Value Date/Time   PROT 7.4 06/24/2017 1236   PROT 7.5 11/04/2016 1500   ALBUMIN 4.4 06/24/2017 1236   ALBUMIN 3.8 11/04/2016 1500   AST 15 06/24/2017 1236   AST 20 11/04/2016 1500   ALT 13 06/24/2017 1236   ALT 15 11/04/2016 1500   ALKPHOS 85 06/24/2017 1236   ALKPHOS 87 11/04/2016 1500   BILITOT 0.3 06/24/2017 1236   BILITOT 0.31 11/04/2016 1500      Component Value Date/Time   TSH 2.030 06/24/2017 1236   TSH 0.129 (L) 08/28/2014 1620    ASSESSMENT AND PLAN: Essential hypertension  Class 3 severe obesity with serious comorbidity and body mass index (BMI) of 40.0 to 44.9 in adult, unspecified obesity type ([facility])  PLAN:  Hypertension We discussed sodium restriction, working on healthy weight loss, and a regular exercise program as the means to achieve improved blood pressure control. Jennifer Frazier agreed with this plan and agreed to follow up as directed. We will continue to monitor her blood pressure as well as her progress with the above lifestyle modifications. She will continue her medications and will watch for signs of hypotension as she continues her lifestyle modifications. Jennifer Frazier agrees to follow up with our clinic in 2 weeks and we will follow up on blood pressure at that time.  We spent > than 50% of the 15 minute visit on the counseling  as documented in the note.  Obesity Jennifer Frazier is currently in the action stage of change. As such, her goal is to continue with weight loss efforts She has agreed to follow our protein rich vegetarian plan Jennifer Frazier has been instructed to work up to a goal of 150 minutes of combined cardio and strengthening exercise per week for weight loss and overall health benefits. We discussed the following Behavioral Modification Strategies today: increasing lean protein intake, increasing vegetables, work on meal planning and easy cooking plans, celebration eating strategies, and planning for success    Jennifer Frazier has agreed to follow up with our clinic in 2 weeks. She was informed of the importance of frequent follow up visits to maximize her success with intensive lifestyle modifications for her multiple health conditions.   OBESITY BEHAVIORAL INTERVENTION VISIT  Today's visit was # 3 out of 22.  Starting weight: 294 lbs Starting date: 06/24/17 Today's weight : 286 lbs  Today's date: 07/22/2017 Total lbs lost to date: 8 (Patients must lose 7 lbs in the first 6 months to continue with counseling)   ASK: We discussed the diagnosis of obesity with Jennifer Frazier today and Jennifer Frazier agreed to give [name] permission to discuss obesity behavioral modification therapy today.  ASSESS: Jennifer Frazier has the diagnosis of obesity and her BMI today is 42.22 Jennifer Frazier is in the action stage of change   ADVISE: Jennifer Frazier was educated on the multiple health risks of obesity as well as the benefit of weight loss to improve her health. She was advised of the need for long term treatment and the importance of lifestyle modifications.  AGREE: Multiple dietary modification options and treatment options were discussed and  Erva agreed to the above obesity treatment plan.  I, Jennifer Frazier, am acting as transcriptionist for Ilene Qua, MD  I have reviewed the above documentation for accuracy and completeness, and I agree with the  above. - Ilene Qua, MD

## 2017-07-28 ENCOUNTER — Encounter (INDEPENDENT_AMBULATORY_CARE_PROVIDER_SITE_OTHER): Payer: Self-pay | Admitting: Family Medicine

## 2017-07-31 ENCOUNTER — Other Ambulatory Visit (INDEPENDENT_AMBULATORY_CARE_PROVIDER_SITE_OTHER): Payer: Self-pay | Admitting: Family Medicine

## 2017-07-31 DIAGNOSIS — I10 Essential (primary) hypertension: Secondary | ICD-10-CM

## 2017-08-12 ENCOUNTER — Ambulatory Visit (INDEPENDENT_AMBULATORY_CARE_PROVIDER_SITE_OTHER): Payer: 59 | Admitting: Family Medicine

## 2017-08-12 VITALS — BP 123/84 | HR 79 | Temp 98.3°F | Ht 69.0 in | Wt 282.0 lb

## 2017-08-12 DIAGNOSIS — R5383 Other fatigue: Secondary | ICD-10-CM | POA: Diagnosis not present

## 2017-08-12 DIAGNOSIS — Z6841 Body Mass Index (BMI) 40.0 and over, adult: Secondary | ICD-10-CM | POA: Diagnosis not present

## 2017-08-12 DIAGNOSIS — I10 Essential (primary) hypertension: Secondary | ICD-10-CM | POA: Diagnosis not present

## 2017-08-16 NOTE — Progress Notes (Signed)
Office: [phone]  /  Fax: [phone]   HPI:   Chief Complaint: OBESITY Jennifer Frazier is here to discuss her progress with her obesity treatment plan. She is on the protein rich vegetarian plan and is following her eating plan approximately 100 % of the time. She states she is exercising 0 minutes 0 times per week. Jennifer Frazier is getting hungry, only a handful of times, right after she's eaten. She only did an additional 100 calorie snack once, for fear of gaining weight. She is on vacation next week. Her weight is 282 lb (127.9 kg) today and has had a weight loss of 4 pounds over a period of 3 weeks since her last visit. She has lost 12 lbs since starting treatment with [name].  Hypertension Jennifer Frazier is a 42 y.o. female with hypertension. Jennifer Frazier denies chest pain, chest pressure or headache.  She is working weight loss to help control her blood pressure with the goal of decreasing her risk of heart attack and stroke. Jennifer Frazier blood pressure is controlled today.  Fatigue Jennifer Frazier voices that she has had increased fatigue since cancer and tamoxifen.  ALLERGIES: Allergies  Allergen Reactions  . Gadolinium      Desc: NAUSEA WITH MRI CONTRAST-NO VOMITING   . Tramadol Hives and Nausea Only    MEDICATIONS: Current Outpatient Medications on File Prior to Visit  Medication Sig Dispense Refill  . amLODipine (NORVASC) 5 MG tablet TAKE 1 TABLET(5 MG) BY MOUTH DAILY 30 tablet 0  . calcium carbonate (TUMS - DOSED IN MG ELEMENTAL CALCIUM) 500 MG chewable tablet Chew 1 tablet by mouth daily as needed for indigestion or heartburn.    . diphenhydrAMINE (BENADRYL) 25 MG tablet Take 25 mg by mouth every 6 (six) hours as needed.    [name] losartan (COZAAR) 25 MG tablet Take 25 mg by mouth daily.    [name] OVER THE COUNTER MEDICATION Dim  150 mg twice a day    . tamoxifen (NOLVADEX) 20 MG tablet TAKE 1 TABLET BY MOUTH EVERY DAY 30 tablet 0   No current facility-administered medications on file prior to visit.       PAST MEDICAL HISTORY: Past Medical History:  Diagnosis Date  . Anemia   . Anxiety   . Cancer ([place])    Breast  . Contact lens/glasses fitting    wears contacts or glasses  . Depression   . Fibroids    uterine fibroids  . Hypertension   . Hypothyroidism   . Obesity   . Personal history of chemotherapy   . Personal history of radiation therapy   . Radiation 04/04/14-05/21/14   Right upper-outer breast  . Sleep apnea    has a cpap-does not always use it  . Thyroid disease     PAST SURGICAL HISTORY: Past Surgical History:  Procedure Laterality Date  . BREAST LUMPECTOMY Right 08/03/2013  . BREAST SURGERY     lumpectomy 7/14  . EYE SURGERY  2013   retinal tear-lazer-both  . PORT A CATH REVISION N/A 12/19/2013   Procedure: REPOSITION PORT;  Surgeon: [name], MD;  Location: [facility];  Service: General;  Laterality: N/A;  . PORT-A-CATH REMOVAL Right 08/08/2014   Procedure: REMOVAL PORT-A-CATH;  Surgeon: [name], MD;  Location: [facility];  Service: General;  Laterality: Right;  . PORTACATH PLACEMENT Right 09/29/2013   Procedure: INSERTION PORT-A-CATH WITH ULTRA SOUND;  Surgeon: [name], MD;  Location: [facility];  Service: General;  Laterality: Right;  . [place]  2012   urerine ablasion  . WISDOM TOOTH EXTRACTION      SOCIAL HISTORY: Social History   Tobacco Use  . Smoking status: Never Smoker  . Smokeless tobacco: Never Used  Substance Use Topics  . Alcohol use: No  . Drug use: No    FAMILY HISTORY: Family History  Problem Relation Age of Onset  . Cancer Father 53       colon  . High blood pressure Mother   . Stroke Mother   . Anxiety disorder Mother   . Sleep apnea Mother   . Obesity Mother   . Cancer Maternal Aunt 50       breast cancer  . Cancer Paternal Aunt 38       breast cancer  . Cancer Maternal Grandmother 17       ovarian or uterine cancer  . Cancer Paternal Grandmother         unknown primary    ROS: Review of Systems  Constitutional: Positive for malaise/fatigue and weight loss.  Cardiovascular: Negative for chest pain.       Negative for chest pressure  Neurological: Negative for headaches.    PHYSICAL EXAM: Blood pressure 123/84, pulse 79, temperature 98.3 F (36.8 C), temperature source Oral, height 5\' 9"  (1.753 m), weight 282 lb (127.9 kg), SpO2 96 %. Body mass index is 41.64 kg/m. Physical Exam  Constitutional: She is oriented to person, place, and time. She appears well-developed and well-nourished.  Cardiovascular: Normal rate.  Pulmonary/Chest: Effort normal.  Musculoskeletal: Normal range of motion.  Neurological: She is oriented to person, place, and time.  Skin: Skin is warm and dry.  Psychiatric: She has a normal mood and affect. Her behavior is normal.  Vitals reviewed.   RECENT LABS AND TESTS: BMET    Component Value Date/Time   NA 141 06/24/2017 1236   NA 140 11/04/2016 1500   K 4.0 06/24/2017 1236   K 3.8 11/04/2016 1500   CL 102 06/24/2017 1236   CO2 23 06/24/2017 1236   CO2 27 11/04/2016 1500   GLUCOSE 82 06/24/2017 1236   GLUCOSE 88 11/04/2016 1500   BUN 15 06/24/2017 1236   BUN 17.6 11/04/2016 1500   CREATININE 0.94 06/24/2017 1236   CREATININE 1.1 11/04/2016 1500   CALCIUM 9.9 06/24/2017 1236   CALCIUM 9.6 11/04/2016 1500   GFRNONAA 75 06/24/2017 1236   GFRAA 86 06/24/2017 1236   Lab Results  Component Value Date   HGBA1C 5.3 06/24/2017   Lab Results  Component Value Date   INSULIN 6.2 06/24/2017   CBC    Component Value Date/Time   WBC 6.3 06/24/2017 1236   WBC 5.7 11/04/2016 1500   WBC 4.8 08/07/2014 1325   RBC 5.08 06/24/2017 1236   RBC 4.80 11/04/2016 1500   RBC 5.34 (H) 08/07/2014 1325   HGB 13.8 06/24/2017 1236   HGB 13.3 11/04/2016 1500   HCT 43.5 06/24/2017 1236   HCT 40.6 11/04/2016 1500   PLT 208 11/04/2016 1500   MCV 86 06/24/2017 1236   MCV 84.4 11/04/2016 1500   MCH 27.2 06/24/2017  1236   MCH 27.7 11/04/2016 1500   MCH 27.5 08/07/2014 1325   MCHC 31.7 06/24/2017 1236   MCHC 32.9 11/04/2016 1500   MCHC 34.0 08/07/2014 1325   RDW 14.8 06/24/2017 1236   RDW 13.9 11/04/2016 1500   LYMPHSABS 2.5 06/24/2017 1236   LYMPHSABS 2.0 11/04/2016 1500   MONOABS 0.3 11/04/2016  1500   EOSABS 0.1 06/24/2017 1236   BASOSABS 0.0 06/24/2017 1236   BASOSABS 0.0 11/04/2016 1500   Iron/TIBC/Ferritin/ %Sat No results found for: IRON, TIBC, FERRITIN, IRONPCTSAT Lipid Panel     Component Value Date/Time   CHOL 128 06/24/2017 1236   TRIG 80 06/24/2017 1236   HDL 57 06/24/2017 1236   LDLCALC 55 06/24/2017 1236   Hepatic Function Panel     Component Value Date/Time   PROT 7.4 06/24/2017 1236   PROT 7.5 11/04/2016 1500   ALBUMIN 4.4 06/24/2017 1236   ALBUMIN 3.8 11/04/2016 1500   AST 15 06/24/2017 1236   AST 20 11/04/2016 1500   ALT 13 06/24/2017 1236   ALT 15 11/04/2016 1500   ALKPHOS 85 06/24/2017 1236   ALKPHOS 87 11/04/2016 1500   BILITOT 0.3 06/24/2017 1236   BILITOT 0.31 11/04/2016 1500      Component Value Date/Time   TSH 2.030 06/24/2017 1236   TSH 0.129 (L) 08/28/2014 1620   Results for HEAVENLEIGH, PETRUZZI (MRN [id number]) as of 08/16/2017 10:30  Ref. Range 06/24/2017 12:36  Vitamin D, 25-Hydroxy Latest Ref Range: 30.0 - 100.0 ng/mL 63.3   ASSESSMENT AND PLAN: Essential hypertension  Other fatigue  Class 3 severe obesity with serious comorbidity and body mass index (BMI) of 40.0 to 44.9 in adult, unspecified obesity type (HCC)  PLAN:  Hypertension We discussed sodium restriction, working on healthy weight loss, and a regular exercise program as the means to achieve improved blood pressure control. Lianny agreed with this plan and agreed to follow up as directed. We will continue to monitor her blood pressure as well as her progress with the above lifestyle modifications. She will continue her medications as prescribed and will watch for signs of hypotension as  she continues her lifestyle modifications.  Fatigue Danetra can start some resistance training for 10 minutes, 2 to 3 times per week. She will follow up with our clinic at the agreed upon time.  We spent > than 50% of the 15 minute visit on the counseling as documented in the note.  Obesity Jennifer Frazier is currently in the action stage of change. As such, her goal is to continue with weight loss efforts She has agreed to keep a food journal with 300 to 400 calories and 30 grams of protein at lunch daily and follow our protein rich vegetarian plan +200 calories Jennifer Frazier can start 10 minutes 2 to 3 times per week of resistance training. We discussed the following Behavioral Modification Strategies today: planning for success, better snacking choices, increase H2O intake, increasing vegetables, work on meal planning and easy cooking plans and emotional eating strategies  Jennifer Frazier has agreed to follow up with our clinic in 2 weeks. She was informed of the importance of frequent follow up visits to maximize her success with intensive lifestyle modifications for her multiple health conditions.   OBESITY BEHAVIORAL INTERVENTION VISIT  Today's visit was # 4 out of 22.  Starting weight: 294 lbs Starting date: 06/24/17 Today's weight : 282 lbs  Today's date: 08/12/2017 Total lbs lost to date: 12    ASK: We discussed the diagnosis of obesity with Jennifer Frazier today and Jennifer Frazier agreed to give [name] permission to discuss obesity behavioral modification therapy today.  ASSESS: Jennifer Frazier has the diagnosis of obesity and her BMI today is 41.63 Jennifer Frazier is in the action stage of change   ADVISE: Jennifer Frazier was educated on the multiple health risks of obesity as well as the benefit of  weight loss to improve her health. She was advised of the need for long term treatment and the importance of lifestyle modifications.  AGREE: Multiple dietary modification options and treatment options were discussed and  Jennifer Frazier agreed to the  above obesity treatment plan.  I, Doreene Nest, am acting as transcriptionist for Eber Jones, MD  I have reviewed the above documentation for accuracy and completeness, and I agree with the above. - Ilene Qua, MD

## 2017-08-22 ENCOUNTER — Other Ambulatory Visit (INDEPENDENT_AMBULATORY_CARE_PROVIDER_SITE_OTHER): Payer: Self-pay | Admitting: Physician Assistant

## 2017-08-22 DIAGNOSIS — I10 Essential (primary) hypertension: Secondary | ICD-10-CM

## 2017-08-30 ENCOUNTER — Ambulatory Visit (INDEPENDENT_AMBULATORY_CARE_PROVIDER_SITE_OTHER): Payer: 59 | Admitting: Family Medicine

## 2017-08-30 VITALS — BP 116/77 | HR 80 | Temp 98.7°F | Ht 69.0 in | Wt 277.0 lb

## 2017-08-30 DIAGNOSIS — Z6841 Body Mass Index (BMI) 40.0 and over, adult: Secondary | ICD-10-CM | POA: Diagnosis not present

## 2017-08-30 DIAGNOSIS — I10 Essential (primary) hypertension: Secondary | ICD-10-CM | POA: Diagnosis not present

## 2017-08-31 ENCOUNTER — Other Ambulatory Visit: Payer: Self-pay | Admitting: Oncology

## 2017-08-31 DIAGNOSIS — Z853 Personal history of malignant neoplasm of breast: Secondary | ICD-10-CM

## 2017-08-31 NOTE — Progress Notes (Signed)
Office: [phone]  /  Fax: [phone]   HPI:   Chief Complaint: OBESITY Jennifer Frazier is here to discuss her progress with her obesity treatment plan. She is on the keep a food journal with 300 to 400 calories and 30 grams of protein at lunch daily plan and the protein rich vegetarian plan +200 calories and is following her eating plan approximately 100 % of the time. She states she is exercising 0 minutes 0 times per week. Jennifer Frazier hasn't journaled in the app because she only looked up information. Jennifer Frazier is having cravings for crunchy or fried foods. Her weight is 277 lb (125.6 kg) today and has had a weight loss of 5 pounds over a period of 2 weeks since her last visit. She has lost 17 lbs since starting treatment with Jennifer Frazier.  Hypertension Jennifer Frazier is a 42 y.o. female with hypertension.  Jennifer Frazier denies chest pain, chest pressure or headache. She is working weight loss to help control her blood pressure with the goal of decreasing her risk of heart attack and stroke. Jennifer Frazier blood pressure is controlled today.  ALLERGIES: Allergies  Allergen Reactions  . Gadolinium      Desc: NAUSEA WITH MRI CONTRAST-NO VOMITING   . Tramadol Hives and Nausea Only    MEDICATIONS: Current Outpatient Medications on File Prior to Visit  Medication Sig Dispense Refill  . amLODipine (NORVASC) 5 MG tablet TAKE 1 TABLET(5 MG) BY MOUTH DAILY 30 tablet 0  . calcium carbonate (TUMS - DOSED IN MG ELEMENTAL CALCIUM) 500 MG chewable tablet Chew 1 tablet by mouth daily as needed for indigestion or heartburn.    . diphenhydrAMINE (BENADRYL) 25 MG tablet Take 25 mg by mouth every 6 (six) hours as needed.    [name] OVER THE COUNTER MEDICATION Dim  150 mg twice a day    . tamoxifen (NOLVADEX) 20 MG tablet TAKE 1 TABLET BY MOUTH EVERY DAY 30 tablet 0   No current facility-administered medications on file prior to visit.     PAST MEDICAL HISTORY: Past Medical History:  Diagnosis Date  . Anemia   . Anxiety   .  Cancer ([place])    Breast  . Contact lens/glasses fitting    wears contacts or glasses  . Depression   . Fibroids    uterine fibroids  . Hypertension   . Hypothyroidism   . Obesity   . Personal history of chemotherapy   . Personal history of radiation therapy   . Radiation 04/04/14-05/21/14   Right upper-outer breast  . Sleep apnea    has a cpap-does not always use it  . Thyroid disease     PAST SURGICAL HISTORY: Past Surgical History:  Procedure Laterality Date  . BREAST LUMPECTOMY Right 08/03/2013  . BREAST SURGERY     lumpectomy 7/14  . EYE SURGERY  2013   retinal tear-lazer-both  . PORT A CATH REVISION N/A 12/19/2013   Procedure: REPOSITION PORT;  Surgeon: [name], MD;  Location: [facility];  Service: General;  Laterality: N/A;  . PORT-A-CATH REMOVAL Right 08/08/2014   Procedure: REMOVAL PORT-A-CATH;  Surgeon: [name], MD;  Location: [facility];  Service: General;  Laterality: Right;  . PORTACATH PLACEMENT Right 09/29/2013   Procedure: INSERTION PORT-A-CATH WITH ULTRA SOUND;  Surgeon: [name], MD;  Location: [facility];  Service: General;  Laterality: Right;  . [place]  2012   urerine ablasion  . WISDOM TOOTH EXTRACTION  SOCIAL HISTORY: Social History   Tobacco Use  . Smoking status: Never Smoker  . Smokeless tobacco: Never Used  Substance Use Topics  . Alcohol use: No  . Drug use: No    FAMILY HISTORY: Family History  Problem Relation Age of Onset  . Cancer Father 23       colon  . High blood pressure Mother   . Stroke Mother   . Anxiety disorder Mother   . Sleep apnea Mother   . Obesity Mother   . Cancer Maternal Aunt 50       breast cancer  . Cancer Paternal Aunt 46       breast cancer  . Cancer Maternal Grandmother 13       ovarian or uterine cancer  . Cancer Paternal Grandmother        unknown primary    ROS: Review of Systems  Constitutional: Positive for weight loss.    Cardiovascular: Negative for chest pain.       Negative for chest pressure  Neurological: Negative for headaches.    PHYSICAL EXAM: Blood pressure 116/77, pulse 80, temperature 98.7 F (37.1 C), temperature source Oral, height 5\' 9"  (1.753 m), weight 277 lb (125.6 kg), SpO2 98 %. Body mass index is 40.91 kg/m. Physical Exam  Constitutional: She is oriented to person, place, and time. She appears well-developed and well-nourished.  Cardiovascular: Normal rate.  Pulmonary/Chest: Effort normal.  Musculoskeletal: Normal range of motion.  Neurological: She is oriented to person, place, and time.  Skin: Skin is warm and dry.  Psychiatric: She has a normal mood and affect. Her behavior is normal.  Vitals reviewed.   RECENT LABS AND TESTS: BMET    Component Value Date/Time   NA 141 06/24/2017 1236   NA 140 11/04/2016 1500   K 4.0 06/24/2017 1236   K 3.8 11/04/2016 1500   CL 102 06/24/2017 1236   CO2 23 06/24/2017 1236   CO2 27 11/04/2016 1500   GLUCOSE 82 06/24/2017 1236   GLUCOSE 88 11/04/2016 1500   BUN 15 06/24/2017 1236   BUN 17.6 11/04/2016 1500   CREATININE 0.94 06/24/2017 1236   CREATININE 1.1 11/04/2016 1500   CALCIUM 9.9 06/24/2017 1236   CALCIUM 9.6 11/04/2016 1500   GFRNONAA 75 06/24/2017 1236   GFRAA 86 06/24/2017 1236   Lab Results  Component Value Date   HGBA1C 5.3 06/24/2017   Lab Results  Component Value Date   INSULIN 6.2 06/24/2017   CBC    Component Value Date/Time   WBC 6.3 06/24/2017 1236   WBC 5.7 11/04/2016 1500   WBC 4.8 08/07/2014 1325   RBC 5.08 06/24/2017 1236   RBC 4.80 11/04/2016 1500   RBC 5.34 (H) 08/07/2014 1325   HGB 13.8 06/24/2017 1236   HGB 13.3 11/04/2016 1500   HCT 43.5 06/24/2017 1236   HCT 40.6 11/04/2016 1500   PLT 208 11/04/2016 1500   MCV 86 06/24/2017 1236   MCV 84.4 11/04/2016 1500   MCH 27.2 06/24/2017 1236   MCH 27.7 11/04/2016 1500   MCH 27.5 08/07/2014 1325   MCHC 31.7 06/24/2017 1236   MCHC 32.9  11/04/2016 1500   MCHC 34.0 08/07/2014 1325   RDW 14.8 06/24/2017 1236   RDW 13.9 11/04/2016 1500   LYMPHSABS 2.5 06/24/2017 1236   LYMPHSABS 2.0 11/04/2016 1500   MONOABS 0.3 11/04/2016 1500   EOSABS 0.1 06/24/2017 1236   BASOSABS 0.0 06/24/2017 1236   BASOSABS 0.0 11/04/2016 1500   Iron/TIBC/Ferritin/ %  Sat No results found for: IRON, TIBC, FERRITIN, IRONPCTSAT Lipid Panel     Component Value Date/Time   CHOL 128 06/24/2017 1236   TRIG 80 06/24/2017 1236   HDL 57 06/24/2017 1236   LDLCALC 55 06/24/2017 1236   Hepatic Function Panel     Component Value Date/Time   PROT 7.4 06/24/2017 1236   PROT 7.5 11/04/2016 1500   ALBUMIN 4.4 06/24/2017 1236   ALBUMIN 3.8 11/04/2016 1500   AST 15 06/24/2017 1236   AST 20 11/04/2016 1500   ALT 13 06/24/2017 1236   ALT 15 11/04/2016 1500   ALKPHOS 85 06/24/2017 1236   ALKPHOS 87 11/04/2016 1500   BILITOT 0.3 06/24/2017 1236   BILITOT 0.31 11/04/2016 1500      Component Value Date/Time   TSH 2.030 06/24/2017 1236   TSH 0.129 (L) 08/28/2014 1620   Results for ZURIYAH, SHATZ (MRN [id number]) as of 08/31/2017 07:35  Ref. Range 06/24/2017 12:36  Vitamin D, 25-Hydroxy Latest Ref Range: 30.0 - 100.0 ng/mL 63.3   ASSESSMENT AND PLAN: Essential hypertension  Class 3 severe obesity with serious comorbidity and body mass index (BMI) of 40.0 to 44.9 in adult, unspecified obesity type (HCC)  PLAN:  Hypertension We discussed sodium restriction, working on healthy weight loss, and a regular exercise program as the means to achieve improved blood pressure control. Dealie agreed with this plan and agreed to follow up as directed. We will continue to monitor her blood pressure as well as her progress with the above lifestyle modifications. She will continue Amlodipine as prescribed and will watch for signs of hypotension as she continues her lifestyle modifications.  We spent > than 50% of the 15 minute visit on the counseling as documented in the  note.  Obesity Jennifer Frazier is currently in the action stage of change. As such, her goal is to continue with weight loss efforts She has agreed to keep a food journal with 300 to 400 calories and 30+ grams of protein at lunch daily and follow our protein rich vegetarian plan +200 calories Jennifer Frazier has been instructed to work up to a goal of 150 minutes of combined cardio and strengthening exercise per week for weight loss and overall health benefits. We discussed the following Behavioral Modification Strategies today: planning for success, better snacking choices, increasing lean protein intake, increasing vegetables and work on meal planning and easy cooking plans  Jennifer Frazier has agreed to follow up with our clinic in 2 weeks. She was informed of the importance of frequent follow up visits to maximize her success with intensive lifestyle modifications for her multiple health conditions.   OBESITY BEHAVIORAL INTERVENTION VISIT  Today's visit was # 5 out of 22.  Starting weight: 294 lbs Starting date: 06/24/17 Today's weight : 277 lbs Today's date: 08/30/2017 Total lbs lost to date: 17    ASK: We discussed the diagnosis of obesity with Jennifer Frazier today and Jennifer Frazier agreed to give Jennifer Frazier permission to discuss obesity behavioral modification therapy today.  ASSESS: Jennifer Frazier has the diagnosis of obesity and her BMI today is 69.89 Jennifer Frazier is in the action stage of change   ADVISE: Jennifer Frazier was educated on the multiple health risks of obesity as well as the benefit of weight loss to improve her health. She was advised of the need for long term treatment and the importance of lifestyle modifications.  AGREE: Multiple dietary modification options and treatment options were discussed and  Jennifer Frazier agreed to the above obesity treatment plan.  I, Doreene Nest, am acting as transcriptionist for Eber Jones, MD  I have reviewed the above documentation for accuracy and completeness, and I agree with the above.  - Ilene Qua, MD

## 2017-09-01 ENCOUNTER — Encounter (INDEPENDENT_AMBULATORY_CARE_PROVIDER_SITE_OTHER): Payer: Self-pay | Admitting: Family Medicine

## 2017-09-16 ENCOUNTER — Ambulatory Visit (INDEPENDENT_AMBULATORY_CARE_PROVIDER_SITE_OTHER): Payer: 59 | Admitting: Family Medicine

## 2017-09-16 VITALS — BP 120/85 | HR 76 | Temp 98.3°F | Ht 69.0 in | Wt 276.0 lb

## 2017-09-16 DIAGNOSIS — Z6841 Body Mass Index (BMI) 40.0 and over, adult: Secondary | ICD-10-CM

## 2017-09-16 DIAGNOSIS — I10 Essential (primary) hypertension: Secondary | ICD-10-CM | POA: Diagnosis not present

## 2017-09-20 NOTE — Progress Notes (Signed)
Office: [phone]  /  Fax: [phone]   HPI:   Chief Complaint: OBESITY Jennifer Frazier is here to discuss her progress with her obesity treatment plan. She is on the keep a food journal with 300 to 400 calories and 30 grams of protein at lunch daily and our protein rich vegetarian plan and is following her eating plan approximately 100 % of the time. She states she is walking 10 to 20 minutes 2 times per week. Jennifer Frazier has been starting to realize, that the foods she used to crave, she cannot have in the house. Jennifer Frazier has minimal hunger. She felt like she could have done better, even though she lost weight. Her weight is 276 lb (125.2 kg) today and has had a weight loss of 1 pound over a period of 2 weeks since her last visit. She has lost 18 lbs since starting treatment with [name].  Hypertension Jennifer Frazier is a 42 y.o. female with hypertension. Jennifer Frazier denies chest pain, chest pressure or headache. She is working weight loss to help control her blood pressure with the goal of decreasing her risk of heart attack and stroke. Jennifer Frazier blood pressure is controlled today.  ALLERGIES: Allergies  Allergen Reactions  . Gadolinium      Desc: NAUSEA WITH MRI CONTRAST-NO VOMITING   . Tramadol Hives and Nausea Only    MEDICATIONS: Current Outpatient Medications on File Prior to Visit  Medication Sig Dispense Refill  . amLODipine (NORVASC) 5 MG tablet TAKE 1 TABLET(5 MG) BY MOUTH DAILY 30 tablet 0  . calcium carbonate (TUMS - DOSED IN MG ELEMENTAL CALCIUM) 500 MG chewable tablet Chew 1 tablet by mouth daily as needed for indigestion or heartburn.    . diphenhydrAMINE (BENADRYL) 25 MG tablet Take 25 mg by mouth every 6 (six) hours as needed.    [name] OVER THE COUNTER MEDICATION Dim  150 mg twice a day    . tamoxifen (NOLVADEX) 20 MG tablet TAKE 1 TABLET BY MOUTH EVERY DAY 30 tablet 0   No current facility-administered medications on file prior to visit.     PAST MEDICAL HISTORY: Past Medical  History:  Diagnosis Date  . Anemia   . Anxiety   . Cancer ([place])    Breast  . Contact lens/glasses fitting    wears contacts or glasses  . Depression   . Fibroids    uterine fibroids  . Hypertension   . Hypothyroidism   . Obesity   . Personal history of chemotherapy   . Personal history of radiation therapy   . Radiation 04/04/14-05/21/14   Right upper-outer breast  . Sleep apnea    has a cpap-does not always use it  . Thyroid disease     PAST SURGICAL HISTORY: Past Surgical History:  Procedure Laterality Date  . BREAST LUMPECTOMY Right 08/03/2013  . BREAST SURGERY     lumpectomy 7/14  . EYE SURGERY  2013   retinal tear-lazer-both  . PORT A CATH REVISION N/A 12/19/2013   Procedure: REPOSITION PORT;  Surgeon: [name], MD;  Location: [place];  Service: General;  Laterality: N/A;  . PORT-A-CATH REMOVAL Right 08/08/2014   Procedure: REMOVAL PORT-A-CATH;  Surgeon: [name], MD;  Location: [place];  Service: General;  Laterality: Right;  . PORTACATH PLACEMENT Right 09/29/2013   Procedure: INSERTION PORT-A-CATH WITH ULTRA SOUND;  Surgeon: [name], MD;  Location: [place];  Service: General;  Laterality: Right;  . [place]  2012  urerine ablasion  . WISDOM TOOTH EXTRACTION      SOCIAL HISTORY: Social History   Tobacco Use  . Smoking status: Never Smoker  . Smokeless tobacco: Never Used  Substance Use Topics  . Alcohol use: No  . Drug use: No    FAMILY HISTORY: Family History  Problem Relation Age of Onset  . Cancer Father 62       colon  . High blood pressure Mother   . Stroke Mother   . Anxiety disorder Mother   . Sleep apnea Mother   . Obesity Mother   . Cancer Maternal Aunt 50       breast cancer  . Cancer Paternal Aunt 47       breast cancer  . Cancer Maternal Grandmother 69       ovarian or uterine cancer  . Cancer Paternal Grandmother        unknown primary    ROS: Review of Systems    Constitutional: Positive for weight loss.  Cardiovascular: Negative for chest pain.       Negative for chest pressure  Neurological: Negative for headaches.    PHYSICAL EXAM: Blood pressure 120/85, pulse 76, temperature 98.3 F (36.8 C), temperature source Oral, height 5\' 9"  (1.753 m), weight 276 lb (125.2 kg), SpO2 98 %. Body mass index is 40.76 kg/m. Physical Exam  Constitutional: She is oriented to person, place, and time. She appears well-developed and well-nourished.  Cardiovascular: Normal rate.  Pulmonary/Chest: Effort normal.  Musculoskeletal: Normal range of motion.  Neurological: She is oriented to person, place, and time.  Skin: Skin is warm and dry.  Psychiatric: She has a normal mood and affect. Her behavior is normal.  Vitals reviewed.   RECENT LABS AND TESTS: BMET    Component Value Date/Time   NA 141 06/24/2017 1236   NA 140 11/04/2016 1500   K 4.0 06/24/2017 1236   K 3.8 11/04/2016 1500   CL 102 06/24/2017 1236   CO2 23 06/24/2017 1236   CO2 27 11/04/2016 1500   GLUCOSE 82 06/24/2017 1236   GLUCOSE 88 11/04/2016 1500   BUN 15 06/24/2017 1236   BUN 17.6 11/04/2016 1500   CREATININE 0.94 06/24/2017 1236   CREATININE 1.1 11/04/2016 1500   CALCIUM 9.9 06/24/2017 1236   CALCIUM 9.6 11/04/2016 1500   GFRNONAA 75 06/24/2017 1236   GFRAA 86 06/24/2017 1236   Lab Results  Component Value Date   HGBA1C 5.3 06/24/2017   Lab Results  Component Value Date   INSULIN 6.2 06/24/2017   CBC    Component Value Date/Time   WBC 6.3 06/24/2017 1236   WBC 5.7 11/04/2016 1500   WBC 4.8 08/07/2014 1325   RBC 5.08 06/24/2017 1236   RBC 4.80 11/04/2016 1500   RBC 5.34 (H) 08/07/2014 1325   HGB 13.8 06/24/2017 1236   HGB 13.3 11/04/2016 1500   HCT 43.5 06/24/2017 1236   HCT 40.6 11/04/2016 1500   PLT 208 11/04/2016 1500   MCV 86 06/24/2017 1236   MCV 84.4 11/04/2016 1500   MCH 27.2 06/24/2017 1236   MCH 27.7 11/04/2016 1500   MCH 27.5 08/07/2014 1325    MCHC 31.7 06/24/2017 1236   MCHC 32.9 11/04/2016 1500   MCHC 34.0 08/07/2014 1325   RDW 14.8 06/24/2017 1236   RDW 13.9 11/04/2016 1500   LYMPHSABS 2.5 06/24/2017 1236   LYMPHSABS 2.0 11/04/2016 1500   MONOABS 0.3 11/04/2016 1500   EOSABS 0.1 06/24/2017 1236   BASOSABS  0.0 06/24/2017 1236   BASOSABS 0.0 11/04/2016 1500   Iron/TIBC/Ferritin/ %Sat No results found for: IRON, TIBC, FERRITIN, IRONPCTSAT Lipid Panel     Component Value Date/Time   CHOL 128 06/24/2017 1236   TRIG 80 06/24/2017 1236   HDL 57 06/24/2017 1236   LDLCALC 55 06/24/2017 1236   Hepatic Function Panel     Component Value Date/Time   PROT 7.4 06/24/2017 1236   PROT 7.5 11/04/2016 1500   ALBUMIN 4.4 06/24/2017 1236   ALBUMIN 3.8 11/04/2016 1500   AST 15 06/24/2017 1236   AST 20 11/04/2016 1500   ALT 13 06/24/2017 1236   ALT 15 11/04/2016 1500   ALKPHOS 85 06/24/2017 1236   ALKPHOS 87 11/04/2016 1500   BILITOT 0.3 06/24/2017 1236   BILITOT 0.31 11/04/2016 1500      Component Value Date/Time   TSH 2.030 06/24/2017 1236   TSH 0.129 (L) 08/28/2014 1620   Results for CORONA, POPOVICH (MRN [id number]) as of 09/20/2017 07:49  Ref. Range 06/24/2017 12:36  Vitamin D, 25-Hydroxy Latest Ref Range: 30.0 - 100.0 ng/mL 63.3   ASSESSMENT AND PLAN: Essential hypertension  Class 3 severe obesity with serious comorbidity and body mass index (BMI) of 40.0 to 44.9 in adult, unspecified obesity type (HCC)  PLAN:  Hypertension We discussed sodium restriction, working on healthy weight loss, and a regular exercise program as the means to achieve improved blood pressure control. Jennifer Frazier agreed with this plan and agreed to follow up as directed. We will continue to monitor her blood pressure as well as her progress with the above lifestyle modifications. She will continue amlodipine and will watch for signs of hypotension as she continues her lifestyle modifications.  We spent > than 50% of the 15 minute visit on the  counseling as documented in the note.  Obesity Jennifer Frazier is currently in the action stage of change. As such, her goal is to continue with weight loss efforts She has agreed to follow our protein rich vegetarian plan +200 calories Jennifer Frazier has been instructed to work up to a goal of 150 minutes of combined cardio and strengthening exercise per week for weight loss and overall health benefits. We discussed the following Behavioral Modification Strategies today: increase H2O intake, better snacking choices, planning for success and work on meal planning and easy cooking plans  Jennifer Frazier has agreed to follow up with our clinic in 2 weeks. She was informed of the importance of frequent follow up visits to maximize her success with intensive lifestyle modifications for her multiple health conditions.   OBESITY BEHAVIORAL INTERVENTION VISIT  Today's visit was # 6   Starting weight: 294 lbs Starting date: 06/24/17 Today's weight : 276 lbs  Today's date: 09/16/2017 Total lbs lost to date: 18 At least 15 minutes were spent on discussing the following behavioral intervention visit.   ASK: We discussed the diagnosis of obesity with Jennifer Frazier today and Jennifer Frazier agreed to give [name] permission to discuss obesity behavioral modification therapy today.  ASSESS: Jennifer Frazier has the diagnosis of obesity and her BMI today is 57.74 Jennifer Frazier is in the action stage of change   ADVISE: Jennifer Frazier was educated on the multiple health risks of obesity as well as the benefit of weight loss to improve her health. She was advised of the need for long term treatment and the importance of lifestyle modifications to improve her current health and to decrease her risk of future health problems.  AGREE: Multiple dietary modification options and treatment  options were discussed and  Jennifer Frazier agreed to follow the recommendations documented in the above note.  ARRANGE: Jennifer Frazier was educated on the importance of frequent visits to treat obesity as  outlined per CMS and USPSTF guidelines and agreed to schedule her next follow up appointment today.  I, Doreene Nest, am acting as transcriptionist for Eber Jones, MD  I have reviewed the above documentation for accuracy and completeness, and I agree with the above. - Ilene Qua, MD

## 2017-09-21 ENCOUNTER — Other Ambulatory Visit (INDEPENDENT_AMBULATORY_CARE_PROVIDER_SITE_OTHER): Payer: Self-pay | Admitting: Family Medicine

## 2017-09-21 DIAGNOSIS — I10 Essential (primary) hypertension: Secondary | ICD-10-CM

## 2017-10-04 ENCOUNTER — Ambulatory Visit
Admission: RE | Admit: 2017-10-04 | Discharge: 2017-10-04 | Disposition: A | Discharge status: Home / Self Care | Payer: 59 | Source: Ambulatory Visit | Attending: Oncology | Admitting: Oncology

## 2017-10-04 DIAGNOSIS — Z853 Personal history of malignant neoplasm of breast: Secondary | ICD-10-CM

## 2017-10-05 ENCOUNTER — Ambulatory Visit (INDEPENDENT_AMBULATORY_CARE_PROVIDER_SITE_OTHER): Payer: 59 | Admitting: Family Medicine

## 2017-10-05 VITALS — BP 127/82 | HR 70 | Temp 97.9°F | Ht 69.0 in | Wt 273.0 lb

## 2017-10-05 DIAGNOSIS — I10 Essential (primary) hypertension: Secondary | ICD-10-CM

## 2017-10-05 DIAGNOSIS — K219 Gastro-esophageal reflux disease without esophagitis: Secondary | ICD-10-CM

## 2017-10-05 DIAGNOSIS — Z6841 Body Mass Index (BMI) 40.0 and over, adult: Secondary | ICD-10-CM

## 2017-10-06 NOTE — Progress Notes (Signed)
Office: [phone]  /  Fax: [phone]   HPI:   Chief Complaint: OBESITY Jennifer Frazier is here to discuss her progress with her obesity treatment plan. She is on the protein rich vegetarian plan + 200 calories and is following her eating plan approximately 97 % of the time. She states she is walking and using resistance bands for 40 minutes 3 times per week. Jennifer Frazier notes she is getting hungry 1-1 and 1/2 hour after meals. She is hungry when she wakes up in the middle of the night.  Her weight is 273 lb (123.8 kg) today and has had a weight loss of 3 pounds over a period of 2 to 3 weeks since her last visit. She has lost 21 lbs since starting treatment with [name].  Hypertension Jennifer Frazier is a 42 y.o. female with hypertension. Jennifer Frazier's blood pressure is well controlled today. She denies chest pain, chest pressure, or headache. She is working weight loss to help control her blood pressure with the goal of decreasing her risk of heart attack and stroke.   GERD Jennifer Frazier has been taking Tums as needed, and she notes increase in symptoms if she is not treating preemptively with 2 Tums.   ALLERGIES: Allergies  Allergen Reactions  . Gadolinium      Desc: NAUSEA WITH MRI CONTRAST-NO VOMITING   . Tramadol Hives and Nausea Only    MEDICATIONS: Current Outpatient Medications on File Prior to Visit  Medication Sig Dispense Refill  . amLODipine (NORVASC) 5 MG tablet TAKE 1 TABLET(5 MG) BY MOUTH DAILY 30 tablet 0  . calcium carbonate (TUMS - DOSED IN MG ELEMENTAL CALCIUM) 500 MG chewable tablet Chew 1 tablet by mouth daily as needed for indigestion or heartburn.    . diphenhydrAMINE (BENADRYL) 25 MG tablet Take 25 mg by mouth every 6 (six) hours as needed.    [name] OVER THE COUNTER MEDICATION Dim  150 mg twice a day    . tamoxifen (NOLVADEX) 20 MG tablet TAKE 1 TABLET BY MOUTH EVERY DAY 30 tablet 0   No current facility-administered medications on file prior to visit.     PAST MEDICAL HISTORY: Past  Medical History:  Diagnosis Date  . Anemia   . Anxiety   . Cancer ([place])    Breast  . Contact lens/glasses fitting    wears contacts or glasses  . Depression   . Fibroids    uterine fibroids  . Hypertension   . Hypothyroidism   . Obesity   . Personal history of chemotherapy   . Personal history of radiation therapy   . Radiation 04/04/14-05/21/14   Right upper-outer breast  . Sleep apnea    has a cpap-does not always use it  . Thyroid disease     PAST SURGICAL HISTORY: Past Surgical History:  Procedure Laterality Date  . BREAST LUMPECTOMY Right 08/03/2013  . BREAST SURGERY     lumpectomy 7/14  . EYE SURGERY  2013   retinal tear-lazer-both  . PORT A CATH REVISION N/A 12/19/2013   Procedure: REPOSITION PORT;  Surgeon: [name], MD;  Location: [place];  Service: General;  Laterality: N/A;  . PORT-A-CATH REMOVAL Right 08/08/2014   Procedure: REMOVAL PORT-A-CATH;  Surgeon: [name], MD;  Location: [place];  Service: General;  Laterality: Right;  . PORTACATH PLACEMENT Right 09/29/2013   Procedure: INSERTION PORT-A-CATH WITH ULTRA SOUND;  Surgeon: [name], MD;  Location: [place];  Service: General;  Laterality: Right;  .  [place]  2012   urerine ablasion  . WISDOM TOOTH EXTRACTION      SOCIAL HISTORY: Social History   Tobacco Use  . Smoking status: Never Smoker  . Smokeless tobacco: Never Used  Substance Use Topics  . Alcohol use: No  . Drug use: No    FAMILY HISTORY: Family History  Problem Relation Age of Onset  . Cancer Father 55       colon  . High blood pressure Mother   . Stroke Mother   . Anxiety disorder Mother   . Sleep apnea Mother   . Obesity Mother   . Cancer Maternal Aunt 50       breast cancer  . Cancer Paternal Aunt 60       breast cancer  . Cancer Maternal Grandmother 59       ovarian or uterine cancer  . Cancer Paternal Grandmother        unknown primary    ROS: Review of  Systems  Constitutional: Positive for weight loss.  Cardiovascular: Negative for chest pain.       Negative chest pressure  Neurological: Negative for headaches.    PHYSICAL EXAM: Blood pressure 127/82, pulse 70, temperature 97.9 F (36.6 C), temperature source Oral, height 5\' 9"  (1.753 m), weight 273 lb (123.8 kg), last menstrual period 09/03/2017, SpO2 99 %. Body mass index is 40.32 kg/m. Physical Exam  Constitutional: She is oriented to person, place, and time. She appears well-developed and well-nourished.  Cardiovascular: Normal rate.  Pulmonary/Chest: Effort normal.  Musculoskeletal: Normal range of motion.  Neurological: She is oriented to person, place, and time.  Skin: Skin is warm and dry.  Psychiatric: She has a normal mood and affect. Her behavior is normal.  Vitals reviewed.   RECENT LABS AND TESTS: BMET    Component Value Date/Time   NA 141 06/24/2017 1236   NA 140 11/04/2016 1500   K 4.0 06/24/2017 1236   K 3.8 11/04/2016 1500   CL 102 06/24/2017 1236   CO2 23 06/24/2017 1236   CO2 27 11/04/2016 1500   GLUCOSE 82 06/24/2017 1236   GLUCOSE 88 11/04/2016 1500   BUN 15 06/24/2017 1236   BUN 17.6 11/04/2016 1500   CREATININE 0.94 06/24/2017 1236   CREATININE 1.1 11/04/2016 1500   CALCIUM 9.9 06/24/2017 1236   CALCIUM 9.6 11/04/2016 1500   GFRNONAA 75 06/24/2017 1236   GFRAA 86 06/24/2017 1236   Lab Results  Component Value Date   HGBA1C 5.3 06/24/2017   Lab Results  Component Value Date   INSULIN 6.2 06/24/2017   CBC    Component Value Date/Time   WBC 6.3 06/24/2017 1236   WBC 5.7 11/04/2016 1500   WBC 4.8 08/07/2014 1325   RBC 5.08 06/24/2017 1236   RBC 4.80 11/04/2016 1500   RBC 5.34 (H) 08/07/2014 1325   HGB 13.8 06/24/2017 1236   HGB 13.3 11/04/2016 1500   HCT 43.5 06/24/2017 1236   HCT 40.6 11/04/2016 1500   PLT 208 11/04/2016 1500   MCV 86 06/24/2017 1236   MCV 84.4 11/04/2016 1500   MCH 27.2 06/24/2017 1236   MCH 27.7 11/04/2016  1500   MCH 27.5 08/07/2014 1325   MCHC 31.7 06/24/2017 1236   MCHC 32.9 11/04/2016 1500   MCHC 34.0 08/07/2014 1325   RDW 14.8 06/24/2017 1236   RDW 13.9 11/04/2016 1500   LYMPHSABS 2.5 06/24/2017 1236   LYMPHSABS 2.0 11/04/2016 1500   MONOABS 0.3 11/04/2016 1500  EOSABS 0.1 06/24/2017 1236   BASOSABS 0.0 06/24/2017 1236   BASOSABS 0.0 11/04/2016 1500   Iron/TIBC/Ferritin/ %Sat No results found for: IRON, TIBC, FERRITIN, IRONPCTSAT Lipid Panel     Component Value Date/Time   CHOL 128 06/24/2017 1236   TRIG 80 06/24/2017 1236   HDL 57 06/24/2017 1236   LDLCALC 55 06/24/2017 1236   Hepatic Function Panel     Component Value Date/Time   PROT 7.4 06/24/2017 1236   PROT 7.5 11/04/2016 1500   ALBUMIN 4.4 06/24/2017 1236   ALBUMIN 3.8 11/04/2016 1500   AST 15 06/24/2017 1236   AST 20 11/04/2016 1500   ALT 13 06/24/2017 1236   ALT 15 11/04/2016 1500   ALKPHOS 85 06/24/2017 1236   ALKPHOS 87 11/04/2016 1500   BILITOT 0.3 06/24/2017 1236   BILITOT 0.31 11/04/2016 1500      Component Value Date/Time   TSH 2.030 06/24/2017 1236   TSH 0.129 (L) 08/28/2014 1620    ASSESSMENT AND PLAN: Essential hypertension  Gastroesophageal reflux disease, esophagitis presence not specified  Class 3 severe obesity with serious comorbidity and body mass index (BMI) of 40.0 to 44.9 in adult, unspecified obesity type ([place])  PLAN:  Hypertension We discussed sodium restriction, working on healthy weight loss, and a regular exercise program as the means to achieve improved blood pressure control. Zaley agreed with this plan and agreed to follow up as directed. We will continue to monitor her blood pressure as well as her progress with the above lifestyle modifications. Jennifer Frazier agrees to continue taking amlodipine and will watch for signs of hypotension as she continues her lifestyle modifications. Jennifer Frazier agrees to follow up with our clinic in 2 weeks.  GERD Jennifer Frazier is to continue taking Tums 2  tablets PO qhs, and she agrees to follow up with our clinic in 2 weeks.  I spent > than 50% of the 15 minute visit on counseling as documented in the note.  Obesity Jennifer Frazier is currently in the action stage of change. As such, her goal is to continue with weight loss efforts She has agreed to follow our protein rich vegetarian plan + 300 calories Jennifer Frazier has been instructed to work up to a goal of 150 minutes of combined cardio and strengthening exercise per week for weight loss and overall health benefits. We discussed the following Behavioral Modification Strategies today: increasing lean protein intake, increasing vegetables, work on meal planning and easy cooking plans, and planning for success   Jennifer Frazier has agreed to follow up with our clinic in 2 weeks. She was informed of the importance of frequent follow up visits to maximize her success with intensive lifestyle modifications for her multiple health conditions.   OBESITY BEHAVIORAL INTERVENTION VISIT  Today's visit was # 7   Starting weight: 294 lbs Starting date: 06/24/17 Today's weight : 273 lbs  Today's date: 10/05/2017 Total lbs lost to date: 21    ASK: We discussed the diagnosis of obesity with Jennifer Frazier today and Jennifer Frazier agreed to give [name] permission to discuss obesity behavioral modification therapy today.  ASSESS: Jennifer Frazier has the diagnosis of obesity and her BMI today is 40.3 Jennifer Frazier is in the action stage of change   ADVISE: Jennifer Frazier was educated on the multiple health risks of obesity as well as the benefit of weight loss to improve her health. She was advised of the need for long term treatment and the importance of lifestyle modifications to improve her current health and to decrease her risk  of future health problems.  AGREE: Multiple dietary modification options and treatment options were discussed and  Jennifer Frazier agreed to follow the recommendations documented in the above note.  ARRANGE: Jennifer Frazier was educated on the  importance of frequent visits to treat obesity as outlined per CMS and USPSTF guidelines and agreed to schedule her next follow up appointment today.  I, Trixie Dredge, am acting as transcriptionist for Ilene Qua, MD  I have reviewed the above documentation for accuracy and completeness, and I agree with the above. - Ilene Qua, MD

## 2017-10-19 ENCOUNTER — Ambulatory Visit (INDEPENDENT_AMBULATORY_CARE_PROVIDER_SITE_OTHER): Payer: 59 | Admitting: Family Medicine

## 2017-10-19 VITALS — BP 133/88 | HR 67 | Temp 97.6°F | Ht 69.0 in | Wt 272.0 lb

## 2017-10-19 DIAGNOSIS — G4733 Obstructive sleep apnea (adult) (pediatric): Secondary | ICD-10-CM | POA: Diagnosis not present

## 2017-10-19 DIAGNOSIS — Z9189 Other specified personal risk factors, not elsewhere classified: Secondary | ICD-10-CM

## 2017-10-19 DIAGNOSIS — I10 Essential (primary) hypertension: Secondary | ICD-10-CM | POA: Diagnosis not present

## 2017-10-19 DIAGNOSIS — Z6841 Body Mass Index (BMI) 40.0 and over, adult: Secondary | ICD-10-CM

## 2017-10-20 NOTE — Progress Notes (Signed)
Office: [phone]  /  Fax: [phone]   HPI:   Chief Complaint: OBESITY Jennifer Frazier is here to discuss her progress with her obesity treatment plan. She is on the  follow our protein rich vegetarian plan +300 calories and is following her eating plan approximately 90 % of the time. She states she is exercising 0 minutes 0 times per week. Jennifer Frazier did have an indulgence of supreme meal at [employer]. She is eating baked Cheetos, Yasso bars, and occasionally Protein One bars. She feels her hunger is better controlled.  Her weight is 272 lb (123.4 kg) today and has had a weight loss of 1 pounds over a period of 2 weeks since her last visit. She has lost 22 lbs since starting treatment with [name].  Hypertension Jennifer Frazier is a 42 y.o. female with hypertension.  Jennifer Frazier denies chest pain/pressure, headache or shortness of breath on exertion. She is working weight loss to help control her blood pressure with the goal of decreasing her risk of heart attack and stroke. Wendys blood pressure is currently controlled.  Obstructive Sleep Apnea Jennifer Frazier has a diagnosis of sleep apnea. She has a CPAP secondary to being told years ago that needed it. She only worse it for 1 week and is not currently using it.    ALLERGIES: Allergies  Allergen Reactions  . Gadolinium      Desc: NAUSEA WITH MRI CONTRAST-NO VOMITING   . Tramadol Hives and Nausea Only    MEDICATIONS: Current Outpatient Medications on File Prior to Visit  Medication Sig Dispense Refill  . amLODipine (NORVASC) 5 MG tablet TAKE 1 TABLET(5 MG) BY MOUTH DAILY 30 tablet 0  . calcium carbonate (TUMS - DOSED IN MG ELEMENTAL CALCIUM) 500 MG chewable tablet Chew 1 tablet by mouth daily as needed for indigestion or heartburn.    . diphenhydrAMINE (BENADRYL) 25 MG tablet Take 25 mg by mouth every 6 (six) hours as needed.    [name] OVER THE COUNTER MEDICATION Dim  150 mg twice a day    . tamoxifen (NOLVADEX) 20 MG tablet TAKE 1 TABLET BY MOUTH EVERY  DAY 30 tablet 0   No current facility-administered medications on file prior to visit.     PAST MEDICAL HISTORY: Past Medical History:  Diagnosis Date  . Anemia   . Anxiety   . Cancer ([place])    Breast  . Contact lens/glasses fitting    wears contacts or glasses  . Depression   . Fibroids    uterine fibroids  . Hypertension   . Hypothyroidism   . Obesity   . Personal history of chemotherapy   . Personal history of radiation therapy   . Radiation 04/04/14-05/21/14   Right upper-outer breast  . Sleep apnea    has a cpap-does not always use it  . Thyroid disease     PAST SURGICAL HISTORY: Past Surgical History:  Procedure Laterality Date  . BREAST LUMPECTOMY Right 08/03/2013  . BREAST SURGERY     lumpectomy 7/14  . EYE SURGERY  2013   retinal tear-lazer-both  . PORT A CATH REVISION N/A 12/19/2013   Procedure: REPOSITION PORT;  Surgeon: [name], MD;  Location: [facility];  Service: General;  Laterality: N/A;  . PORT-A-CATH REMOVAL Right 08/08/2014   Procedure: REMOVAL PORT-A-CATH;  Surgeon: [name], MD;  Location: [facility];  Service: General;  Laterality: Right;  . PORTACATH PLACEMENT Right 09/29/2013   Procedure: INSERTION PORT-A-CATH WITH ULTRA SOUND;  Surgeon: [name], MD;  Location: [facility];  Service: General;  Laterality: Right;  . [place]  2012   urerine ablasion  . WISDOM TOOTH EXTRACTION      SOCIAL HISTORY: Social History   Tobacco Use  . Smoking status: Never Smoker  . Smokeless tobacco: Never Used  Substance Use Topics  . Alcohol use: No  . Drug use: No    FAMILY HISTORY: Family History  Problem Relation Age of Onset  . Cancer Father 43       colon  . High blood pressure Mother   . Stroke Mother   . Anxiety disorder Mother   . Sleep apnea Mother   . Obesity Mother   . Cancer Maternal Aunt 50       breast cancer  . Cancer Paternal Aunt 23       breast cancer  . Cancer Maternal  Grandmother 20       ovarian or uterine cancer  . Cancer Paternal Grandmother        unknown primary    ROS: Review of Systems  Constitutional: Positive for weight loss.  Respiratory: Negative for shortness of breath.   Cardiovascular: Negative for chest pain.       Negative for chest pressure   Neurological: Negative for headaches.    PHYSICAL EXAM: Blood pressure 133/88, pulse 67, temperature 97.6 F (36.4 C), temperature source Oral, height 5\' 9"  (1.753 m), weight 272 lb (123.4 kg), SpO2 99 %. Body mass index is 40.17 kg/m. Physical Exam  RECENT LABS AND TESTS: BMET    Component Value Date/Time   NA 141 06/24/2017 1236   NA 140 11/04/2016 1500   K 4.0 06/24/2017 1236   K 3.8 11/04/2016 1500   CL 102 06/24/2017 1236   CO2 23 06/24/2017 1236   CO2 27 11/04/2016 1500   GLUCOSE 82 06/24/2017 1236   GLUCOSE 88 11/04/2016 1500   BUN 15 06/24/2017 1236   BUN 17.6 11/04/2016 1500   CREATININE 0.94 06/24/2017 1236   CREATININE 1.1 11/04/2016 1500   CALCIUM 9.9 06/24/2017 1236   CALCIUM 9.6 11/04/2016 1500   GFRNONAA 75 06/24/2017 1236   GFRAA 86 06/24/2017 1236   Lab Results  Component Value Date   HGBA1C 5.3 06/24/2017   Lab Results  Component Value Date   INSULIN 6.2 06/24/2017   CBC    Component Value Date/Time   WBC 6.3 06/24/2017 1236   WBC 5.7 11/04/2016 1500   WBC 4.8 08/07/2014 1325   RBC 5.08 06/24/2017 1236   RBC 4.80 11/04/2016 1500   RBC 5.34 (H) 08/07/2014 1325   HGB 13.8 06/24/2017 1236   HGB 13.3 11/04/2016 1500   HCT 43.5 06/24/2017 1236   HCT 40.6 11/04/2016 1500   PLT 208 11/04/2016 1500   MCV 86 06/24/2017 1236   MCV 84.4 11/04/2016 1500   MCH 27.2 06/24/2017 1236   MCH 27.7 11/04/2016 1500   MCH 27.5 08/07/2014 1325   MCHC 31.7 06/24/2017 1236   MCHC 32.9 11/04/2016 1500   MCHC 34.0 08/07/2014 1325   RDW 14.8 06/24/2017 1236   RDW 13.9 11/04/2016 1500   LYMPHSABS 2.5 06/24/2017 1236   LYMPHSABS 2.0 11/04/2016 1500   MONOABS  0.3 11/04/2016 1500   EOSABS 0.1 06/24/2017 1236   BASOSABS 0.0 06/24/2017 1236   BASOSABS 0.0 11/04/2016 1500   Iron/TIBC/Ferritin/ %Sat No results found for: IRON, TIBC, FERRITIN, IRONPCTSAT Lipid Panel     Component Value Date/Time  CHOL 128 06/24/2017 1236   TRIG 80 06/24/2017 1236   HDL 57 06/24/2017 1236   LDLCALC 55 06/24/2017 1236   Hepatic Function Panel     Component Value Date/Time   PROT 7.4 06/24/2017 1236   PROT 7.5 11/04/2016 1500   ALBUMIN 4.4 06/24/2017 1236   ALBUMIN 3.8 11/04/2016 1500   AST 15 06/24/2017 1236   AST 20 11/04/2016 1500   ALT 13 06/24/2017 1236   ALT 15 11/04/2016 1500   ALKPHOS 85 06/24/2017 1236   ALKPHOS 87 11/04/2016 1500   BILITOT 0.3 06/24/2017 1236   BILITOT 0.31 11/04/2016 1500      Component Value Date/Time   TSH 2.030 06/24/2017 1236   TSH 0.129 (L) 08/28/2014 1620    ASSESSMENT AND PLAN: Essential hypertension  OSA (obstructive sleep apnea)  Class 3 severe obesity with serious comorbidity and body mass index (BMI) of 40.0 to 44.9 in adult, unspecified obesity type (HCC)  PLAN: Hypertension We discussed sodium restriction, working on healthy weight loss, and a regular exercise program as the means to achieve improved blood pressure control. K[place]ton agreed with this plan and agreed to follow up as directed. We will continue to monitor her blood pressure as well as her progress with the above lifestyle modifications. She will continue her medications as prescribed and will watch for signs of hypotension as she continues her lifestyle modifications.  Obstructive Sleep Apnea We discussed sleep apnea. She agrees to follow up with neurology as needed. Marcia wants to contemplate seeing a sleep physician. She agrees to follow up with our clinic as directed.   I spent > than 50% of the 15 minute visit on counseling as documented in the note.  Obesity Debby is currently in the action stage of change. As such, her goal is to  continue with weight loss efforts She has agreed to follow our protein rich vegetarian plan +300 calories.  Bannie has been instructed to work up to a goal of 150 minutes of combined cardio and strengthening exercise per week for weight loss and overall health benefits. We discussed the following Behavioral Modification Strategies today: increasing lean protein intake, increasing vegetables, work on meal planning and easy cooking plans, and planning for success.    Lanice has agreed to follow up with our clinic in 2 weeks. She was informed of the importance of frequent follow up visits to maximize her success with intensive lifestyle modifications for her multiple health conditions.   OBESITY BEHAVIORAL INTERVENTION VISIT  Today's visit was # 8   Starting weight: 294 lb Starting date: 06/24/17 Today's weight: 272 lb Today's date: 10/19/17 Total lbs lost to date: 22 lb At least 15 minutes were spent on discussing the following behavioral intervention visit.   ASK: We discussed the diagnosis of obesity with Ernst Breach today and Keandra agreed to give [name] permission to discuss obesity behavioral modification therapy today.  ASSESS: Tashawna has the diagnosis of obesity and her BMI today is 40.15 Afreen is in the action stage of change   ADVISE: Rajvi was educated on the multiple health risks of obesity as well as the benefit of weight loss to improve her health. She was advised of the need for long term treatment and the importance of lifestyle modifications to improve her current health and to decrease her risk of future health problems.  AGREE: Multiple dietary modification options and treatment options were discussed and  Gloria agreed to follow the recommendations documented in the above note.  ARRANGE: Lashanti was educated on the importance of frequent visits to treat obesity as outlined per CMS and USPSTF guidelines and agreed to schedule her next follow up appointment today.  I,  Renee Ramus, am acting as transcriptionist for Ilene Qua, MD   I have reviewed the above documentation for accuracy and completeness, and I agree with the above. - Ilene Qua, MD

## 2017-10-21 ENCOUNTER — Other Ambulatory Visit (INDEPENDENT_AMBULATORY_CARE_PROVIDER_SITE_OTHER): Payer: Self-pay | Admitting: Family Medicine

## 2017-10-21 DIAGNOSIS — I10 Essential (primary) hypertension: Secondary | ICD-10-CM

## 2017-10-21 MED ORDER — AMLODIPINE BESYLATE 5 MG PO TABS: ORAL_TABLET | ORAL | 0 refills | Status: DC

## 2017-10-21 NOTE — Addendum Note (Signed)
Addended by: Eber Jones on: 10/21/2017 03:31 PM   Modules accepted: Orders, Level of Service

## 2017-11-03 NOTE — Progress Notes (Signed)
[facility]  Telephone:([phone]) [phone] Fax:(336) [phone]     ID: Jennifer Frazier DOB: 08/24/1975  MR#: [id number]  XVQ#:[id number]  Patient Care Team: Sharilyn Sites, MD as PCP - General (Family Medicine) Shyteria Lewis, Virgie Dad, MD as Consulting Physician (Oncology) Thea Silversmith, MD as Consulting Physician (Radiation Oncology) [name], MD as Consulting Physician (General Surgery) Hinda Lenis (Dermatology) OTHER MD: Legrand Como Altheimer MD  CHIEF COMPLAINT: Estrogen receptor positive breast cancer  CURRENT TREATMENT: Tamoxifen   BREAST CANCER HISTORY: From the original intake note:  The patient had screening mammography at [facility] showing calcifications in the right breast, and was referred to the breast Center 07/06/2013 for additional views. Right diagnostic mammography confirmed a group of amorphous calcifications in the upper outer right breast measuring 1.2 cm.  Biopsy of this area 07/17/2013 showed ([id number]) ductal carcinoma in situ, high-grade, estrogen receptor 100% positive, and progesterone receptor 96% positive, both with strong staining intensity.  The patient was then referred to surgery and on 07/25/2013 underwent bilateral breast MRI. This showed a breast density category CEA. In the right breast there was an area of clumped nodular enhancement measuring 3 cm. There was also a 1.5 cm circumscribed oval mass in the medial portion of the right breast consistent with a fibroadenoma. The left breast was unremarkable, and there were no abnormal appearing lymph nodes.  On 08/08/2013 the patient underwent right lumpectomy and sentinel lymph node sampling. The pathology ([id number] was (showed, in addition to ductal carcinoma in situ, invasive ductal carcinoma, measuring 0.7 cm, grade 3, estrogen receptor 86% positive, progesterone receptor 89% positive, both with strong staining intensity, with an MIB-1 of 62%, and no HER-2 amplification.  Both sentinel lymph nodes were clear. Margins were ample.   The patient's subsequent history is as detailed below  INTERVAL HISTORY: Jennifer Frazier returns today for follow-up and treatment of her estrogen receptor positive breast cancer. She continues on tamoxifen, with good tolerance. She denies hot flashes or vaginal wetness.   She notes that she had a menstrual cycle in August, 2019 and two cycles in September. She hasn't had a menstrual cycle this month as of yet. Prior to August, she would have minimal vaginal spotting without menstruation symptoms. She has gone more than one year without a menstrual cycle since being diagnosed with breast cancer. Her GYN is Dr. Karalee Height and she has followed up with her GYN to ensure that there wasn't any uterine thickening in April and July 2019, both ultrasounds were normal. However, it was noted that there were cysts noted on her ovaries at the time and her menstrual cycle started 1 week following the July ultrasound.   Since her last visit to the office, she underwent a bilateral diagnostic mammography at the [facility] 10/04/2017 shows the breast density to be category B. There was no evidence of malignancy.  REVIEW OF SYSTEMS: Jennifer Frazier reports that for exercise, she is walking and a little bit of resistance training. 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 She used to be evaluated by a holistic doctor, Sanda Klein, who advised that she start taking DIM for elevated estrogen levels and she is still taking it at this time. She denies unusual headaches, visual changes, nausea, vomiting, or dizziness. There has been no unusual cough, phlegm production, or pleurisy. This been no change in bowel or bladder habits. She denies unexplained fatigue or unexplained weight loss, bleeding, rash, or fever. A detailed review  of systems was otherwise stable.     PAST MEDICAL HISTORY: Past Medical History:  Diagnosis Date  . Anemia   .  Anxiety   . Cancer ([place])    Breast  . Contact lens/glasses fitting    wears contacts or glasses  . Depression   . Fibroids    uterine fibroids  . Hypertension   . Hypothyroidism   . Obesity   . Personal history of chemotherapy   . Personal history of radiation therapy   . Radiation 04/04/14-05/21/14   Right upper-outer breast  . Sleep apnea    has a cpap-does not always use it  . Thyroid disease     PAST SURGICAL HISTORY: Past Surgical History:  Procedure Laterality Date  . BREAST LUMPECTOMY Right 08/03/2013  . BREAST SURGERY     lumpectomy 7/14  . EYE SURGERY  2013   retinal tear-lazer-both  . PORT A CATH REVISION N/A 12/19/2013   Procedure: REPOSITION PORT;  Surgeon: [name], MD;  Location: [facility];  Service: General;  Laterality: N/A;  . PORT-A-CATH REMOVAL Right 08/08/2014   Procedure: REMOVAL PORT-A-CATH;  Surgeon: [name], MD;  Location: [facility];  Service: General;  Laterality: Right;  . PORTACATH PLACEMENT Right 09/29/2013   Procedure: INSERTION PORT-A-CATH WITH ULTRA SOUND;  Surgeon: [name], MD;  Location: [facility];  Service: General;  Laterality: Right;  . [place]  2012   urerine ablasion  . WISDOM TOOTH EXTRACTION      FAMILY HISTORY Family History  Problem Relation Age of Onset  . Cancer Father 68       colon  . High blood pressure Mother   . Stroke Mother   . Anxiety disorder Mother   . Sleep apnea Mother   . Obesity Mother   . Cancer Maternal Aunt 50       breast cancer  . Cancer Paternal Aunt 56       breast cancer  . Cancer Maternal Grandmother 51       ovarian or uterine cancer  . Cancer Paternal Grandmother        unknown primary   the patient's father died at the age of 74, been diagnosed with colon cancer at the age of 68. The patient's mother is living, currently 47 years old. The patient has one brother, no sisters. One of the patient's mother's sisters was diagnosed with  breast cancer at the age of 76 in one of the patient's father's mother was diagnosed with breast cancer at the age of 41. There is no history of ovarian cancer in the family.  GYNECOLOGIC HISTORY:  No LMP recorded. Patient is perimenopausal. Menarche age 42, for the patient is GX P0. She was still having regular periods at the start of chemotherapy. She used oral contraceptives for approximately one year with no complications.  Her periods stopped with chemotherapy and have not resumed. She has been evaluated at the fertility center in [place] and they do not feel they would be able to stimulate production.  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 She finds the work challenging and interesting but also stressful. She is single and her mother lives with her. (The patient's mother is wheelchair-bound secondary to a stroke).  ADVANCED DIRECTIVES: Not in place; at the 08/24/2013 visit the patient was given the appropriate documents to come feel notarize associate may declare healthcare power of attorney.   HEALTH MAINTENANCE: Social History   Tobacco Use  . Smoking status: Never Smoker  . Smokeless tobacco: Never Used  Substance Use Topics  . Alcohol use: No  . Drug use: No     Colonoscopy:  PAP:  Bone density:  Lipid panel:  Allergies  Allergen Reactions  . Gadolinium      Desc: NAUSEA WITH MRI CONTRAST-NO VOMITING   . Tramadol Hives and Nausea Only    Current Outpatient Medications  Medication Sig Dispense Refill  . amLODipine (NORVASC) 5 MG tablet TAKE 1 TABLET(5 MG) BY MOUTH DAILY 30 tablet 0  . calcium carbonate (TUMS - DOSED IN MG ELEMENTAL CALCIUM) 500 MG chewable tablet Chew 1 tablet by mouth daily as needed for indigestion or  heartburn.    . diphenhydrAMINE (BENADRYL) 25 MG tablet Take 25 mg by mouth every 6 (six) hours as needed.    [name] OVER THE COUNTER MEDICATION Dim  150 mg twice a day    . tamoxifen (NOLVADEX) 20 MG tablet TAKE 1 TABLET BY MOUTH EVERY DAY 30 tablet 0   No current facility-administered medications for this visit.     OBJECTIVE: Morbidly obese African American woman who appears well  Vitals:   11/04/17 1438  BP: (!) 141/81  Pulse: 63  Resp: 18  Temp: 98.6 F (37 C)  SpO2: 100%     Body mass index is 40.51 kg/m.    ECOG FS:0 - Asymptomatic Note that she has lost 25 pounds as of the last 9 months  Sclerae unicteric, EOMs intact Oropharynx clear and moist No cervical or supraclavicular adenopathy Lungs no rales or rhonchi Heart regular rate and rhythm Abd soft, nontender, positive bowel sounds MSK no focal spinal tenderness, no upper extremity lymphedema Neuro: nonfocal, well oriented, appropriate affect Breasts: Status post lumpectomy and radiation.  There is still some hyperpigmentation.  There is no evidence of local recurrence.  The left breast is benign.  Both axillae are benign.  LAB RESULTS:  CMP     Component Value Date/Time   NA 141 06/24/2017 1236   NA 140 11/04/2016 1500   K 4.0 06/24/2017 1236   K 3.8 11/04/2016 1500   CL 102 06/24/2017 1236   CO2 23 06/24/2017 1236   CO2 27 11/04/2016 1500   GLUCOSE 82 06/24/2017 1236   GLUCOSE 88 11/04/2016 1500   BUN 15 06/24/2017 1236   BUN 17.6 11/04/2016 1500   CREATININE 0.94 06/24/2017 1236   CREATININE 1.1 11/04/2016 1500   CALCIUM 9.9 06/24/2017 1236   CALCIUM 9.6 11/04/2016 1500   PROT 7.4 06/24/2017 1236   PROT 7.5 11/04/2016 1500   ALBUMIN 4.4 06/24/2017 1236   ALBUMIN 3.8 11/04/2016 1500   AST 15 06/24/2017 1236   AST 20 11/04/2016 1500   ALT 13 06/24/2017 1236   ALT 15 11/04/2016 1500   ALKPHOS 85 06/24/2017 1236   ALKPHOS 87 11/04/2016 1500   BILITOT 0.3 06/24/2017 1236   BILITOT 0.31 11/04/2016 1500     GFRNONAA 75 06/24/2017 1236   GFRAA 86 06/24/2017 1236    I No results found for: SPEP  Lab Results  Component Value Date   WBC 5.2 11/04/2017   NEUTROABS 2.8 11/04/2017   HGB 13.4 11/04/2017   HCT 41.1 11/04/2017   MCV 85.4 11/04/2017   PLT 193 11/04/2017  Chemistry      Component Value Date/Time   NA 141 06/24/2017 1236   NA 140 11/04/2016 1500   K 4.0 06/24/2017 1236   K 3.8 11/04/2016 1500   CL 102 06/24/2017 1236   CO2 23 06/24/2017 1236   CO2 27 11/04/2016 1500   BUN 15 06/24/2017 1236   BUN 17.6 11/04/2016 1500   CREATININE 0.94 06/24/2017 1236   CREATININE 1.1 11/04/2016 1500      Component Value Date/Time   CALCIUM 9.9 06/24/2017 1236   CALCIUM 9.6 11/04/2016 1500   ALKPHOS 85 06/24/2017 1236   ALKPHOS 87 11/04/2016 1500   AST 15 06/24/2017 1236   AST 20 11/04/2016 1500   ALT 13 06/24/2017 1236   ALT 15 11/04/2016 1500   BILITOT 0.3 06/24/2017 1236   BILITOT 0.31 11/04/2016 1500       No results found for: LABCA2  No components found for: LABCA125  No results for input(s): INR in the last 168 hours.  Urinalysis    Component Value Date/Time   COLORURINE YELLOW 02/02/2014 0845   APPEARANCEUR CLEAR 02/02/2014 0845   LABSPEC 1.009 02/02/2014 0845   PHURINE 6.0 02/02/2014 0845   GLUCOSEU NEGATIVE 02/02/2014 0845   HGBUR NEGATIVE 02/02/2014 0845   BILIRUBINUR NEGATIVE 02/02/2014 0845   KETONESUR NEGATIVE 02/02/2014 0845   PROTEINUR NEGATIVE 02/02/2014 0845   UROBILINOGEN 0.2 02/02/2014 0845   NITRITE NEGATIVE 02/02/2014 0845   LEUKOCYTESUR TRACE (A) 02/02/2014 0845    STUDIES: Bilateral diagnostic mammography at the [facility] 10/04/2017 shows the breast density to be category B. There was no evidence of malignancy.   ASSESSMENT: 42 y.o. BRCA negative [place] woman status post right breast Upper outer quadrant biopsy 07/17/2013 for high-grade ductal carcinoma in situ, estrogen and progesterone receptor positive  (1) status  post right breast upper outer quadrant lumpectomy and sentinel lymph node sampling 08/08/2013 for a pT1b pN0, stage IA invasive ductal carcinoma, grade 3, estrogen and progesterone receptor positive, with an MIB-1 of 62% and no HER-2 amplification  (2) genetics sent 08/02/2013 was normal but did identify a variant of uncertain significance called BRCA2, [id number].  (3) Oncotype score of 23 predicts a risk of outside the breast recurrence within 10 years of 15% if the patient's only systemic treatment is tamoxifen for 5 years. Adjuvant chemotherapy was recommended  (4) doxorubicin and cyclophosphamide in dose dense fashion x4 completed 11/16/2013, followed by paclitaxel weekly x12 completed 03/08/2014  (5) adjuvant radiation 3/9/206-05/21/2014:  Right breast / 45 Gray @ 1.8 Pearline Cables per fraction x 25 fractions Right breast boost / 16 Gray at [employer] per fraction x 8 fractions  (6) tamoxifen started 06/20/2014   PLAN: Quinn is now a little over 2 years out from definitive surgery for her breast cancer with no evidence of disease recurrence.  This is very favorable.  She is tolerating tamoxifen well and the plan is to continue that a minimum of 5 years.  She wondered if she could go off the IM.  I see no reason why she could not.  She has had some spotting.  Dr. Philis Pique has been very proactive in evaluating this and so far it sounds like everything is fine as far as the endometrium is concerned  He is clearly benefiting from her participation in [facility].  It is rather expensive but she does like it.  [name] may wish to become pregnant if and when she does find a right partner.  If she  does decide to try to become pregnant and have a family she will need to immediately go off tamoxifen.  She will let us know  Otherwise I will see her again in 1 year.  She knows to call for any problems that may develop before the next visit.  Atalia Litzinger, Virgie Dad, MD  11/04/17 2:59  PM Medical Oncology and Hematology [facility] [address] [place], [place] [address] Tel. [phone]    Fax. [phone]    I, Soijett Blue am acting as scribe for Dr. Sarajane Jews C. Aanvi Voyles.  I, Lurline Del MD, have reviewed the above documentation for accuracy and completeness, and I agree with the above.

## 2017-11-04 ENCOUNTER — Telehealth: Payer: Self-pay | Admitting: Oncology

## 2017-11-04 ENCOUNTER — Inpatient Hospital Stay: Payer: 59

## 2017-11-04 ENCOUNTER — Inpatient Hospital Stay: Payer: 59 | Attending: Oncology | Admitting: Oncology

## 2017-11-04 VITALS — BP 141/81 | HR 63 | Temp 98.6°F | Resp 18 | Ht 69.0 in | Wt 274.3 lb

## 2017-11-04 DIAGNOSIS — Z803 Family history of malignant neoplasm of breast: Secondary | ICD-10-CM | POA: Insufficient documentation

## 2017-11-04 DIAGNOSIS — D0511 Intraductal carcinoma in situ of right breast: Secondary | ICD-10-CM

## 2017-11-04 DIAGNOSIS — Z79810 Long term (current) use of selective estrogen receptor modulators (SERMs): Secondary | ICD-10-CM | POA: Insufficient documentation

## 2017-11-04 DIAGNOSIS — C50411 Malignant neoplasm of upper-outer quadrant of right female breast: Secondary | ICD-10-CM

## 2017-11-04 DIAGNOSIS — Z8041 Family history of malignant neoplasm of ovary: Secondary | ICD-10-CM

## 2017-11-04 DIAGNOSIS — I10 Essential (primary) hypertension: Secondary | ICD-10-CM | POA: Insufficient documentation

## 2017-11-04 DIAGNOSIS — Z9221 Personal history of antineoplastic chemotherapy: Secondary | ICD-10-CM

## 2017-11-04 DIAGNOSIS — Z923 Personal history of irradiation: Secondary | ICD-10-CM | POA: Diagnosis not present

## 2017-11-04 DIAGNOSIS — Z79899 Other long term (current) drug therapy: Secondary | ICD-10-CM | POA: Diagnosis not present

## 2017-11-04 DIAGNOSIS — Z170 Estrogen receptor positive status [ER+]: Secondary | ICD-10-CM

## 2017-11-04 DIAGNOSIS — Z800 Family history of malignant neoplasm of digestive organs: Secondary | ICD-10-CM | POA: Insufficient documentation

## 2017-11-04 LAB — CBC WITH DIFFERENTIAL/PLATELET
Abs Immature Granulocytes: 0.01 10*3/uL (ref 0.00–0.07)
BASOS PCT: 0 %
Basophils Absolute: 0 10*3/uL (ref 0.0–0.1)
EOS ABS: 0.1 10*3/uL (ref 0.0–0.5)
EOS PCT: 3 %
HCT: 41.1 % (ref 36.0–46.0)
Hemoglobin: 13.4 g/dL (ref 12.0–15.0)
IMMATURE GRANULOCYTES: 0 %
Lymphocytes Relative: 38 %
Lymphs Abs: 2 10*3/uL (ref 0.7–4.0)
MCH: 27.9 pg (ref 26.0–34.0)
MCHC: 32.6 g/dL (ref 30.0–36.0)
MCV: 85.4 fL (ref 80.0–100.0)
MONOS PCT: 5 %
Monocytes Absolute: 0.3 10*3/uL (ref 0.1–1.0)
NEUTROS PCT: 54 %
NRBC: 0 % (ref 0.0–0.2)
Neutro Abs: 2.8 10*3/uL (ref 1.7–7.7)
PLATELETS: 193 10*3/uL (ref 150–400)
RBC: 4.81 MIL/uL (ref 3.87–5.11)
RDW: 13 % (ref 11.5–15.5)
WBC: 5.2 10*3/uL (ref 4.0–10.5)

## 2017-11-04 LAB — COMPREHENSIVE METABOLIC PANEL
ALBUMIN: 3.9 g/dL (ref 3.5–5.0)
ALT: 10 U/L (ref 0–44)
ANION GAP: 9 (ref 5–15)
AST: 14 U/L — AB (ref 15–41)
Alkaline Phosphatase: 67 U/L (ref 38–126)
BILIRUBIN TOTAL: 0.4 mg/dL (ref 0.3–1.2)
BUN: 20 mg/dL (ref 6–20)
CO2: 25 mmol/L (ref 22–32)
Calcium: 10.2 mg/dL (ref 8.9–10.3)
Chloride: 106 mmol/L (ref 98–111)
Creatinine, Ser: 0.94 mg/dL (ref 0.44–1.00)
GFR calc Af Amer: >60 mL/min (ref >60)
GFR calc non Af Amer: >60 mL/min (ref >60)
GLUCOSE: 86 mg/dL (ref 70–99)
POTASSIUM: 3.7 mmol/L (ref 3.5–5.1)
SODIUM: 140 mmol/L (ref 135–145)
Total Protein: 7.8 g/dL (ref 6.5–8.1)

## 2017-11-04 MED ORDER — TAMOXIFEN CITRATE 20 MG PO TABS: 20.0000 mg | ORAL_TABLET | Freq: Every day | ORAL | 4 refills | Status: DC

## 2017-11-04 NOTE — Telephone Encounter (Signed)
Gave pt avs and calendar  °

## 2017-11-08 ENCOUNTER — Ambulatory Visit (INDEPENDENT_AMBULATORY_CARE_PROVIDER_SITE_OTHER): Payer: 59 | Admitting: Family Medicine

## 2017-11-08 VITALS — BP 133/85 | HR 72 | Temp 98.1°F | Ht 69.0 in | Wt 269.0 lb

## 2017-11-08 DIAGNOSIS — I10 Essential (primary) hypertension: Secondary | ICD-10-CM

## 2017-11-08 DIAGNOSIS — C50919 Malignant neoplasm of unspecified site of unspecified female breast: Secondary | ICD-10-CM

## 2017-11-08 DIAGNOSIS — Z170 Estrogen receptor positive status [ER+]: Secondary | ICD-10-CM | POA: Diagnosis not present

## 2017-11-08 DIAGNOSIS — E66812 Obesity, class 2: Secondary | ICD-10-CM

## 2017-11-08 DIAGNOSIS — Z6839 Body mass index (BMI) 39.0-39.9, adult: Secondary | ICD-10-CM

## 2017-11-10 NOTE — Progress Notes (Signed)
Office: [phone]  /  Fax: [phone]   HPI:   Chief Complaint: OBESITY Jennifer Frazier is here to discuss her progress with her obesity treatment plan. She is on the protein rich vegetarian plan + 300 calories and is following her eating plan approximately 90 % of the time. She states she is walking for 30 minutes 3 times per week. Jennifer Frazier recently saw Dr. Jana Hakim (Oncology). She notes increased hunger after dinner, about 2 hours following meal. She says its hard to tell the difference between hunger versus cravings. She has snacks available to eat after dinner, and she declines wanting to try putting food together by herself. Her weight is 269 lb (122 kg) today and has had a weight loss of 3 pounds over a period of 3 weeks since her last visit. She has lost 25 lbs since starting treatment with [name].  Hypertension Jennifer Frazier is a 42 y.o. female with hypertension. Jennifer Frazier's blood pressure is controlled today. She denies chest pain, chest pressure, or headaches. She is working weight loss to help control her blood pressure with the goal of decreasing her risk of heart attack and stroke.   Estrogen Positive Breast Cancer Jennifer Frazier saw Dr. Jana Hakim, and will continue to follow up with Oncology.  ALLERGIES: Allergies  Allergen Reactions  . Gadolinium      Desc: NAUSEA WITH MRI CONTRAST-NO VOMITING   . Tramadol Hives and Nausea Only    MEDICATIONS: Current Outpatient Medications on File Prior to Visit  Medication Sig Dispense Refill  . amLODipine (NORVASC) 5 MG tablet TAKE 1 TABLET(5 MG) BY MOUTH DAILY 30 tablet 0  . calcium carbonate (TUMS - DOSED IN MG ELEMENTAL CALCIUM) 500 MG chewable tablet Chew 1 tablet by mouth daily as needed for indigestion or heartburn.    . diphenhydrAMINE (BENADRYL) 25 MG tablet Take 25 mg by mouth every 6 (six) hours as needed.    . tamoxifen (NOLVADEX) 20 MG tablet Take 1 tablet (20 mg total) by mouth daily. 90 tablet 4   No current facility-administered  medications on file prior to visit.     PAST MEDICAL HISTORY: Past Medical History:  Diagnosis Date  . Anemia   . Anxiety   . Cancer ([place])    Breast  . Contact lens/glasses fitting    wears contacts or glasses  . Depression   . Fibroids    uterine fibroids  . Hypertension   . Hypothyroidism   . Obesity   . Personal history of chemotherapy   . Personal history of radiation therapy   . Radiation 04/04/14-05/21/14   Right upper-outer breast  . Sleep apnea    has a cpap-does not always use it  . Thyroid disease     PAST SURGICAL HISTORY: Past Surgical History:  Procedure Laterality Date  . BREAST LUMPECTOMY Right 08/03/2013  . BREAST SURGERY     lumpectomy 7/14  . EYE SURGERY  2013   retinal tear-lazer-both  . PORT A CATH REVISION N/A 12/19/2013   Procedure: REPOSITION PORT;  Surgeon: [name], MD;  Location: [facility];  Service: General;  Laterality: N/A;  . PORT-A-CATH REMOVAL Right 08/08/2014   Procedure: REMOVAL PORT-A-CATH;  Surgeon: [name], MD;  Location: [facility];  Service: General;  Laterality: Right;  . PORTACATH PLACEMENT Right 09/29/2013   Procedure: INSERTION PORT-A-CATH WITH ULTRA SOUND;  Surgeon: [name], MD;  Location: [facility];  Service: General;  Laterality: Right;  . [place]  2012  urerine ablasion  . WISDOM TOOTH EXTRACTION      SOCIAL HISTORY: Social History   Tobacco Use  . Smoking status: Never Smoker  . Smokeless tobacco: Never Used  Substance Use Topics  . Alcohol use: No  . Drug use: No    FAMILY HISTORY: Family History  Problem Relation Age of Onset  . Cancer Father 14       colon  . High blood pressure Mother   . Stroke Mother   . Anxiety disorder Mother   . Sleep apnea Mother   . Obesity Mother   . Cancer Maternal Aunt 50       breast cancer  . Cancer Paternal Aunt 42       breast cancer  . Cancer Maternal Grandmother 55       ovarian or uterine cancer  .  Cancer Paternal Grandmother        unknown primary    ROS: Review of Systems  Constitutional: Positive for weight loss.  Cardiovascular: Negative for chest pain.       Negative chest pressure  Neurological: Negative for headaches.    PHYSICAL EXAM: Blood pressure 133/85, pulse 72, temperature 98.1 F (36.7 C), temperature source Oral, height 5\' 9"  (1.753 m), weight 269 lb (122 kg), SpO2 97 %. Body mass index is 39.72 kg/m. Physical Exam  Constitutional: She is oriented to person, place, and time. She appears well-developed and well-nourished.  Cardiovascular: Normal rate.  Pulmonary/Chest: Effort normal.  Musculoskeletal: Normal range of motion.  Neurological: She is oriented to person, place, and time.  Skin: Skin is warm and dry.  Psychiatric: She has a normal mood and affect. Her behavior is normal.  Vitals reviewed.   RECENT LABS AND TESTS: BMET    Component Value Date/Time   NA 140 11/04/2017 1424   NA 141 06/24/2017 1236   NA 140 11/04/2016 1500   K 3.7 11/04/2017 1424   K 3.8 11/04/2016 1500   CL 106 11/04/2017 1424   CO2 25 11/04/2017 1424   CO2 27 11/04/2016 1500   GLUCOSE 86 11/04/2017 1424   GLUCOSE 88 11/04/2016 1500   BUN 20 11/04/2017 1424   BUN 15 06/24/2017 1236   BUN 17.6 11/04/2016 1500   CREATININE 0.94 11/04/2017 1424   CREATININE 1.1 11/04/2016 1500   CALCIUM 10.2 11/04/2017 1424   CALCIUM 9.6 11/04/2016 1500   GFRNONAA >60 11/04/2017 1424   GFRAA >60 11/04/2017 1424   Lab Results  Component Value Date   HGBA1C 5.3 06/24/2017   Lab Results  Component Value Date   INSULIN 6.2 06/24/2017   CBC    Component Value Date/Time   WBC 5.2 11/04/2017 1424   RBC 4.81 11/04/2017 1424   HGB 13.4 11/04/2017 1424   HGB 13.8 06/24/2017 1236   HGB 13.3 11/04/2016 1500   HCT 41.1 11/04/2017 1424   HCT 43.5 06/24/2017 1236   HCT 40.6 11/04/2016 1500   PLT 193 11/04/2017 1424   PLT 208 11/04/2016 1500   MCV 85.4 11/04/2017 1424   MCV 86  06/24/2017 1236   MCV 84.4 11/04/2016 1500   MCH 27.9 11/04/2017 1424   MCHC 32.6 11/04/2017 1424   RDW 13.0 11/04/2017 1424   RDW 14.8 06/24/2017 1236   RDW 13.9 11/04/2016 1500   LYMPHSABS 2.0 11/04/2017 1424   LYMPHSABS 2.5 06/24/2017 1236   LYMPHSABS 2.0 11/04/2016 1500   MONOABS 0.3 11/04/2017 1424   MONOABS 0.3 11/04/2016 1500   EOSABS 0.1 11/04/2017 1424  EOSABS 0.1 06/24/2017 1236   BASOSABS 0.0 11/04/2017 1424   BASOSABS 0.0 06/24/2017 1236   BASOSABS 0.0 11/04/2016 1500   Iron/TIBC/Ferritin/ %Sat No results found for: IRON, TIBC, FERRITIN, IRONPCTSAT Lipid Panel     Component Value Date/Time   CHOL 128 06/24/2017 1236   TRIG 80 06/24/2017 1236   HDL 57 06/24/2017 1236   LDLCALC 55 06/24/2017 1236   Hepatic Function Panel     Component Value Date/Time   PROT 7.8 11/04/2017 1424   PROT 7.4 06/24/2017 1236   PROT 7.5 11/04/2016 1500   ALBUMIN 3.9 11/04/2017 1424   ALBUMIN 4.4 06/24/2017 1236   ALBUMIN 3.8 11/04/2016 1500   AST 14 (L) 11/04/2017 1424   AST 20 11/04/2016 1500   ALT 10 11/04/2017 1424   ALT 15 11/04/2016 1500   ALKPHOS 67 11/04/2017 1424   ALKPHOS 87 11/04/2016 1500   BILITOT 0.4 11/04/2017 1424   BILITOT 0.3 06/24/2017 1236   BILITOT 0.31 11/04/2016 1500      Component Value Date/Time   TSH 2.030 06/24/2017 1236   TSH 0.129 (L) 08/28/2014 1620    ASSESSMENT AND PLAN: Essential hypertension  Malignant neoplasm of breast, stage 1, estrogen receptor positive, unspecified laterality (HCC)  Class 2 severe obesity with serious comorbidity and body mass index (BMI) of 39.0 to 39.9 in adult, unspecified obesity type (HCC)  PLAN:  Hypertension We discussed sodium restriction, working on healthy weight loss, and a regular exercise program as the means to achieve improved blood pressure control. Jennifer Frazier agreed with this plan and agreed to follow up as directed. We will continue to monitor her blood pressure as well as her progress with the  above lifestyle modifications. Jennifer Frazier agrees to continue taking amlodipine and will watch for signs of hypotension as she continues her lifestyle modifications. Jennifer Frazier agrees to follow up with our clinic in 2 weeks.  Estrogen Positive Breast Cancer Jennifer Frazier will continue taking tamoxifen until she wants to discuss starting a family. Jennifer Frazier agrees to follow up with our clinic in 2 weeks.  I spent > than 50% of the 15 minute visit on counseling as documented in the note.  Obesity Jennifer Frazier is currently in the action stage of change. As such, her goal is to continue with weight loss efforts She has agreed to keep a food journal with 250-400 calories and 25+ grams of protein at breakfast daily and follow our protein rich vegetarian plan + 300 calories Jennifer Frazier has been instructed to work up to a goal of 150 minutes of combined cardio and strengthening exercise per week for weight loss and overall health benefits. We discussed the following Behavioral Modification Strategies today: increasing lean protein intake, increasing vegetables, work on meal planning and easy cooking plans, and planning for success   Jennifer Frazier has agreed to follow up with our clinic in 2 weeks. She was informed of the importance of frequent follow up visits to maximize her success with intensive lifestyle modifications for her multiple health conditions.   OBESITY BEHAVIORAL INTERVENTION VISIT  Today's visit was # 9   Starting weight: 294 lbs Starting date: 06/24/17 Today's weight : 269 lbs  Today's date: 11/08/2017 Total lbs lost to date: 25    ASK: We discussed the diagnosis of obesity with Jennifer Frazier today and Jennifer Frazier agreed to give [name] permission to discuss obesity behavioral modification therapy today.  ASSESS: Jennifer Frazier has the diagnosis of obesity and her BMI today is 39.71 Jennifer Frazier is in the action stage of change  ADVISE: Jennifer Frazier was educated on the multiple health risks of obesity as well as the benefit of weight loss to  improve her health. She was advised of the need for long term treatment and the importance of lifestyle modifications to improve her current health and to decrease her risk of future health problems.  AGREE: Multiple dietary modification options and treatment options were discussed and  Jennifer Frazier agreed to follow the recommendations documented in the above note.  ARRANGE: Jennifer Frazier was educated on the importance of frequent visits to treat obesity as outlined per CMS and USPSTF guidelines and agreed to schedule her next follow up appointment today.  I, Trixie Dredge, am acting as transcriptionist for Ilene Qua, MD  I have reviewed the above documentation for accuracy and completeness, and I agree with the above. - Ilene Qua, MD

## 2017-11-18 ENCOUNTER — Telehealth: Payer: Self-pay | Admitting: *Deleted

## 2017-11-20 ENCOUNTER — Other Ambulatory Visit (INDEPENDENT_AMBULATORY_CARE_PROVIDER_SITE_OTHER): Payer: Self-pay | Admitting: Family Medicine

## 2017-11-20 DIAGNOSIS — I10 Essential (primary) hypertension: Secondary | ICD-10-CM

## 2017-11-30 ENCOUNTER — Ambulatory Visit (INDEPENDENT_AMBULATORY_CARE_PROVIDER_SITE_OTHER): Payer: 59 | Admitting: Family Medicine

## 2017-11-30 VITALS — BP 134/93 | HR 83 | Temp 98.7°F | Ht 69.0 in | Wt 263.0 lb

## 2017-11-30 DIAGNOSIS — Z6839 Body mass index (BMI) 39.0-39.9, adult: Secondary | ICD-10-CM | POA: Diagnosis not present

## 2017-11-30 DIAGNOSIS — E8881 Metabolic syndrome: Secondary | ICD-10-CM | POA: Diagnosis not present

## 2017-11-30 DIAGNOSIS — E88819 Insulin resistance, unspecified: Secondary | ICD-10-CM

## 2017-11-30 DIAGNOSIS — I10 Essential (primary) hypertension: Secondary | ICD-10-CM | POA: Diagnosis not present

## 2017-12-01 NOTE — Progress Notes (Signed)
Office: [phone]  /  Fax: [phone]   HPI:   Chief Complaint: OBESITY Jennifer Frazier is here to discuss her progress with her obesity treatment plan. She is on the keep a food journal with 250-400 calories and 25+ grams of protein at breakfast daily and follow our protein rich vegetarian plan and is following her eating plan approximately 90 % of the time. She states she is exercising 0 minutes 0 times per week. Jennifer Frazier hasn't exercised in 2 weeks and feels like she cant believe she lost the weight she did. She has tried a few different options for breakfast and lunch.  Her weight is 263 lb (119.3 kg) today and has had a weight loss of 6 pounds over a period of 3 weeks since her last visit. She has lost 31 lbs since starting treatment with [name].  Hypertension Jennifer Frazier is a 42 y.o. female with hypertension. [name] blood pressure is controlled, diastolic slightly elevated. She denies chest pain, chest pressure, or headaches. She is working weight loss to help control her blood pressure with the goal of decreasing her risk of heart attack and stroke.   Insulin Resistance Jennifer Frazier has a diagnosis of insulin resistance based on her elevated fasting insulin level >5. Although Abrina's blood glucose readings are still under good control, insulin resistance puts her at greater risk of metabolic syndrome and diabetes. She notes occasional carbohydrate cravings and she is not taking metformin currently, and continues to work on diet and exercise to decrease risk of diabetes.  ALLERGIES: Allergies  Allergen Reactions  . Gadolinium      Desc: NAUSEA WITH MRI CONTRAST-NO VOMITING   . Tramadol Hives and Nausea Only    MEDICATIONS: Current Outpatient Medications on File Prior to Visit  Medication Sig Dispense Refill  . amLODipine (NORVASC) 5 MG tablet TAKE 1 TABLET(5 MG) BY MOUTH DAILY 30 tablet 0  . calcium carbonate (TUMS - DOSED IN MG ELEMENTAL CALCIUM) 500 MG chewable tablet Chew 1 tablet by mouth  daily as needed for indigestion or heartburn.    . diphenhydrAMINE (BENADRYL) 25 MG tablet Take 25 mg by mouth every 6 (six) hours as needed.    . tamoxifen (NOLVADEX) 20 MG tablet Take 1 tablet (20 mg total) by mouth daily. 90 tablet 4   No current facility-administered medications on file prior to visit.     PAST MEDICAL HISTORY: Past Medical History:  Diagnosis Date  . Anemia   . Anxiety   . Cancer ([place])    Breast  . Contact lens/glasses fitting    wears contacts or glasses  . Depression   . Fibroids    uterine fibroids  . Hypertension   . Hypothyroidism   . Obesity   . Personal history of chemotherapy   . Personal history of radiation therapy   . Radiation 04/04/14-05/21/14   Right upper-outer breast  . Sleep apnea    has a cpap-does not always use it  . Thyroid disease     PAST SURGICAL HISTORY: Past Surgical History:  Procedure Laterality Date  . BREAST LUMPECTOMY Right 08/03/2013  . BREAST SURGERY     lumpectomy 7/14  . EYE SURGERY  2013   retinal tear-lazer-both  . PORT A CATH REVISION N/A 12/19/2013   Procedure: REPOSITION PORT;  Surgeon: [name], MD;  Location: [facility];  Service: General;  Laterality: N/A;  . PORT-A-CATH REMOVAL Right 08/08/2014   Procedure: REMOVAL PORT-A-CATH;  Surgeon: [name], MD;  Location: [facility];  Service: General;  Laterality: Right;  . PORTACATH PLACEMENT Right 09/29/2013   Procedure: INSERTION PORT-A-CATH WITH ULTRA SOUND;  Surgeon: [name], MD;  Location: [facility];  Service: General;  Laterality: Right;  . [place]  2012   urerine ablasion  . WISDOM TOOTH EXTRACTION      SOCIAL HISTORY: Social History   Tobacco Use  . Smoking status: Never Smoker  . Smokeless tobacco: Never Used  Substance Use Topics  . Alcohol use: No  . Drug use: No    FAMILY HISTORY: Family History  Problem Relation Age of Onset  . Cancer Father 33       colon  . High blood  pressure Mother   . Stroke Mother   . Anxiety disorder Mother   . Sleep apnea Mother   . Obesity Mother   . Cancer Maternal Aunt 50       breast cancer  . Cancer Paternal Aunt 71       breast cancer  . Cancer Maternal Grandmother 65       ovarian or uterine cancer  . Cancer Paternal Grandmother        unknown primary    ROS: Review of Systems  Constitutional: Positive for weight loss.  Cardiovascular: Negative for chest pain.       Negative chest pressure  Neurological: Negative for headaches.    PHYSICAL EXAM: Blood pressure (!) 134/93, pulse 83, temperature 98.7 F (37.1 C), temperature source Oral, height 5\' 9"  (1.753 m), weight 263 lb (119.3 kg), SpO2 99 %. Body mass index is 38.84 kg/m. Physical Exam  Constitutional: She is oriented to person, place, and time. She appears well-developed and well-nourished.  Cardiovascular: Normal rate.  Pulmonary/Chest: Effort normal.  Musculoskeletal: Normal range of motion.  Neurological: She is oriented to person, place, and time.  Skin: Skin is warm and dry.  Psychiatric: She has a normal mood and affect. Her behavior is normal.  Vitals reviewed.   RECENT LABS AND TESTS: BMET    Component Value Date/Time   NA 140 11/04/2017 1424   NA 141 06/24/2017 1236   NA 140 11/04/2016 1500   K 3.7 11/04/2017 1424   K 3.8 11/04/2016 1500   CL 106 11/04/2017 1424   CO2 25 11/04/2017 1424   CO2 27 11/04/2016 1500   GLUCOSE 86 11/04/2017 1424   GLUCOSE 88 11/04/2016 1500   BUN 20 11/04/2017 1424   BUN 15 06/24/2017 1236   BUN 17.6 11/04/2016 1500   CREATININE 0.94 11/04/2017 1424   CREATININE 1.1 11/04/2016 1500   CALCIUM 10.2 11/04/2017 1424   CALCIUM 9.6 11/04/2016 1500   GFRNONAA >60 11/04/2017 1424   GFRAA >60 11/04/2017 1424   Lab Results  Component Value Date   HGBA1C 5.3 06/24/2017   Lab Results  Component Value Date   INSULIN 6.2 06/24/2017   CBC    Component Value Date/Time   WBC 5.2 11/04/2017 1424   RBC  4.81 11/04/2017 1424   HGB 13.4 11/04/2017 1424   HGB 13.8 06/24/2017 1236   HGB 13.3 11/04/2016 1500   HCT 41.1 11/04/2017 1424   HCT 43.5 06/24/2017 1236   HCT 40.6 11/04/2016 1500   PLT 193 11/04/2017 1424   PLT 208 11/04/2016 1500   MCV 85.4 11/04/2017 1424   MCV 86 06/24/2017 1236   MCV 84.4 11/04/2016 1500   MCH 27.9 11/04/2017 1424   MCHC 32.6 11/04/2017 1424   RDW 13.0 11/04/2017 1424  RDW 14.8 06/24/2017 1236   RDW 13.9 11/04/2016 1500   LYMPHSABS 2.0 11/04/2017 1424   LYMPHSABS 2.5 06/24/2017 1236   LYMPHSABS 2.0 11/04/2016 1500   MONOABS 0.3 11/04/2017 1424   MONOABS 0.3 11/04/2016 1500   EOSABS 0.1 11/04/2017 1424   EOSABS 0.1 06/24/2017 1236   BASOSABS 0.0 11/04/2017 1424   BASOSABS 0.0 06/24/2017 1236   BASOSABS 0.0 11/04/2016 1500   Iron/TIBC/Ferritin/ %Sat No results found for: IRON, TIBC, FERRITIN, IRONPCTSAT Lipid Panel     Component Value Date/Time   CHOL 128 06/24/2017 1236   TRIG 80 06/24/2017 1236   HDL 57 06/24/2017 1236   LDLCALC 55 06/24/2017 1236   Hepatic Function Panel     Component Value Date/Time   PROT 7.8 11/04/2017 1424   PROT 7.4 06/24/2017 1236   PROT 7.5 11/04/2016 1500   ALBUMIN 3.9 11/04/2017 1424   ALBUMIN 4.4 06/24/2017 1236   ALBUMIN 3.8 11/04/2016 1500   AST 14 (L) 11/04/2017 1424   AST 20 11/04/2016 1500   ALT 10 11/04/2017 1424   ALT 15 11/04/2016 1500   ALKPHOS 67 11/04/2017 1424   ALKPHOS 87 11/04/2016 1500   BILITOT 0.4 11/04/2017 1424   BILITOT 0.3 06/24/2017 1236   BILITOT 0.31 11/04/2016 1500      Component Value Date/Time   TSH 2.030 06/24/2017 1236   TSH 0.129 (L) 08/28/2014 1620    ASSESSMENT AND PLAN: Essential hypertension  Insulin resistance  Class 2 severe obesity with serious comorbidity and body mass index (BMI) of 39.0 to 39.9 in adult, unspecified obesity type (HCC)  PLAN:  Hypertension We discussed sodium restriction, working on healthy weight loss, and a regular exercise program as  the means to achieve improved blood pressure control. Kenise agreed with this plan and agreed to follow up as directed. We will continue to monitor her blood pressure as well as her progress with the above lifestyle modifications. Zitlaly agrees to continue taking amlodipine, no refill needed and will watch for signs of hypotension as she continues her lifestyle modifications. Kyara agrees to follow up with our clinic in 2 weeks.  Insulin Resistance Cherine will continue to work on weight loss, exercise, and decreasing simple carbohydrates in her diet to help decrease the risk of diabetes. We dicussed metformin including benefits and risks. She was informed that eating too many simple carbohydrates or too many calories at one sitting increases the likelihood of GI side effects. Shelva declined metformin for now and prescription was not written today. We will repeat labs at next visit. Lataja agrees to follow up with our clinic in 2 weeks as directed to monitor her progress.  I spent > than 50% of the 15 minute visit on counseling as documented in the note.  Obesity Arli is currently in the action stage of change. As such, her goal is to continue with weight loss efforts She has agreed to follow our protein rich vegetarian plan Makayli has been instructed to work up to a goal of 150 minutes of combined cardio and strengthening exercise per week for weight loss and overall health benefits. We discussed the following Behavioral Modification Strategies today: increasing lean protein intake, increasing vegetables, work on meal planning and easy cooking plans, and planning for success   Vila has agreed to follow up with our clinic in 2 weeks. She was informed of the importance of frequent follow up visits to maximize her success with intensive lifestyle modifications for her multiple health conditions.   OBESITY  BEHAVIORAL INTERVENTION VISIT  Today's visit was # 10   Starting weight: 294 lbs Starting date:  06/24/17 Today's weight : 263 lbs  Today's date: 11/30/2017 Total lbs lost to date: 60    ASK: We discussed the diagnosis of obesity with Ernst Breach today and Lithzy agreed to give [name] permission to discuss obesity behavioral modification therapy today.  ASSESS: Olar has the diagnosis of obesity and her BMI today is 38.82 Quinnley is in the action stage of change   ADVISE: Hayes was educated on the multiple health risks of obesity as well as the benefit of weight loss to improve her health. She was advised of the need for long term treatment and the importance of lifestyle modifications to improve her current health and to decrease her risk of future health problems.  AGREE: Multiple dietary modification options and treatment options were discussed and  Ellean agreed to follow the recommendations documented in the above note.  ARRANGE: Brannon was educated on the importance of frequent visits to treat obesity as outlined per CMS and USPSTF guidelines and agreed to schedule her next follow up appointment today.  I, Trixie Dredge, am acting as transcriptionist for Ilene Qua, MD  I have reviewed the above documentation for accuracy and completeness, and I agree with the above. - Ilene Qua, MD

## 2017-12-08 ENCOUNTER — Ambulatory Visit: Admit: 2017-12-08 | Payer: 59 | Admitting: Obstetrics and Gynecology

## 2017-12-08 SURGERY — DILATATION & CURETTAGE/HYSTEROSCOPY WITH MYOSURE
Anesthesia: Choice

## 2017-12-16 ENCOUNTER — Ambulatory Visit (INDEPENDENT_AMBULATORY_CARE_PROVIDER_SITE_OTHER): Payer: 59 | Admitting: Family Medicine

## 2017-12-16 VITALS — BP 138/96 | HR 71 | Temp 98.6°F | Ht 69.0 in | Wt 263.0 lb

## 2017-12-16 DIAGNOSIS — E8881 Metabolic syndrome: Secondary | ICD-10-CM | POA: Diagnosis not present

## 2017-12-16 DIAGNOSIS — G4709 Other insomnia: Secondary | ICD-10-CM

## 2017-12-16 DIAGNOSIS — Z9189 Other specified personal risk factors, not elsewhere classified: Secondary | ICD-10-CM | POA: Diagnosis not present

## 2017-12-16 DIAGNOSIS — I10 Essential (primary) hypertension: Secondary | ICD-10-CM | POA: Diagnosis not present

## 2017-12-16 DIAGNOSIS — Z6838 Body mass index (BMI) 38.0-38.9, adult: Secondary | ICD-10-CM

## 2017-12-16 DIAGNOSIS — R5383 Other fatigue: Secondary | ICD-10-CM

## 2017-12-17 LAB — HEMOGLOBIN A1C
Est. average glucose Bld gHb Est-mCnc: 100 mg/dL
Hgb A1c MFr Bld: 5.1 % (ref 4.8–5.6)

## 2017-12-17 LAB — INSULIN, RANDOM: INSULIN: 13.5 u[IU]/mL (ref 2.6–24.9)

## 2017-12-17 LAB — VITAMIN D 25 HYDROXY (VIT D DEFICIENCY, FRACTURES): Vit D, 25-Hydroxy: 59.8 ng/mL (ref 30.0–100.0)

## 2017-12-20 ENCOUNTER — Other Ambulatory Visit (INDEPENDENT_AMBULATORY_CARE_PROVIDER_SITE_OTHER): Payer: Self-pay | Admitting: Family Medicine

## 2017-12-20 DIAGNOSIS — I10 Essential (primary) hypertension: Secondary | ICD-10-CM

## 2017-12-21 NOTE — Progress Notes (Signed)
Office: [phone]  /  Fax: [phone]   HPI:   Chief Complaint: OBESITY Jennifer Frazier is here to discuss her progress with her obesity treatment plan. She is on the vegetarian plan and is following her eating plan approximately 80 to 90 % of the time. She states she is exercising 0 minutes 0 times per week. Brylee is currently journaling at breakfast and this is going well.  Her weight is 263 lb (119.3 kg) today and has not lost weight since her last visit. She has lost 31 lbs since starting treatment with [name].  Insulin Resistance Jennifer Frazier has a diagnosis of insulin resistance based on her elevated fasting insulin level >5. Although Jennifer Frazier's blood glucose readings are still under good control, insulin resistance puts her at greater risk of metabolic syndrome and diabetes. She is not taking metformin currently and continues to work on diet and exercise to decrease risk of diabetes. She denies polyphagia.  Fatigue Jennifer Frazier feels her energy is lower than it should be. This has worsened with weight gain. She has a new puppy and the puppy is interrupting her sleep.  Hypertension Jennifer Frazier is a 42 y.o. female with hypertension. BP is elevated today. She is working on weight loss to help control her blood pressure with the goal of decreasing her risk of heart attack and stroke. Jennifer Frazier is compliant on Norvasc 5mg . Jennifer Frazier denies chest pain, headaches, or shortness of breath on exertion.  At risk for cardiovascular disease Jennifer Frazier is at a higher than average risk for cardiovascular disease due to hypertension and obesity. She currently denies any chest pain.  Insomnia Jennifer Frazier was diagnosed with sleep apnea several years ago, but does not use a CPAP. She is able to fall asleep but struggles with staying asleep since 2008 when her dad passed and her mom had a stroke. She has tried melatonin without success.  ALLERGIES: Allergies  Allergen Reactions  . Gadolinium      Desc: NAUSEA WITH MRI CONTRAST-NO  VOMITING   . Tramadol Hives and Nausea Only    MEDICATIONS: Current Outpatient Medications on File Prior to Visit  Medication Sig Dispense Refill  . amLODipine (NORVASC) 5 MG tablet TAKE 1 TABLET(5 MG) BY MOUTH DAILY 30 tablet 0  . calcium carbonate (TUMS - DOSED IN MG ELEMENTAL CALCIUM) 500 MG chewable tablet Chew 1 tablet by mouth daily as needed for indigestion or heartburn.    . diphenhydrAMINE (BENADRYL) 25 MG tablet Take 25 mg by mouth every 6 (six) hours as needed.    . tamoxifen (NOLVADEX) 20 MG tablet Take 1 tablet (20 mg total) by mouth daily. 90 tablet 4   No current facility-administered medications on file prior to visit.     PAST MEDICAL HISTORY: Past Medical History:  Diagnosis Date  . Anemia   . Anxiety   . Cancer ([place])    Breast  . Contact lens/glasses fitting    wears contacts or glasses  . Depression   . Fibroids    uterine fibroids  . Hypertension   . Hypothyroidism   . Obesity   . Personal history of chemotherapy   . Personal history of radiation therapy   . Radiation 04/04/14-05/21/14   Right upper-outer breast  . Sleep apnea    has a cpap-does not always use it  . Thyroid disease     PAST SURGICAL HISTORY: Past Surgical History:  Procedure Laterality Date  . BREAST LUMPECTOMY Right 08/03/2013  . BREAST SURGERY     lumpectomy 7/14  .  EYE SURGERY  2013   retinal tear-lazer-both  . PORT A CATH REVISION N/A 12/19/2013   Procedure: REPOSITION PORT;  Surgeon: [name], MD;  Location: [facility];  Service: General;  Laterality: N/A;  . PORT-A-CATH REMOVAL Right 08/08/2014   Procedure: REMOVAL PORT-A-CATH;  Surgeon: [name], MD;  Location: [facility];  Service: General;  Laterality: Right;  . PORTACATH PLACEMENT Right 09/29/2013   Procedure: INSERTION PORT-A-CATH WITH ULTRA SOUND;  Surgeon: [name], MD;  Location: [facility];  Service: General;  Laterality: Right;  . [place]  2012   urerine  ablasion  . WISDOM TOOTH EXTRACTION      SOCIAL HISTORY: Social History   Tobacco Use  . Smoking status: Never Smoker  . Smokeless tobacco: Never Used  Substance Use Topics  . Alcohol use: No  . Drug use: No    FAMILY HISTORY: Family History  Problem Relation Age of Onset  . Cancer Father 45       colon  . High blood pressure Mother   . Stroke Mother   . Anxiety disorder Mother   . Sleep apnea Mother   . Obesity Mother   . Cancer Maternal Aunt 50       breast cancer  . Cancer Paternal Aunt 63       breast cancer  . Cancer Maternal Grandmother 76       ovarian or uterine cancer  . Cancer Paternal Grandmother        unknown primary    ROS: Review of Systems  Constitutional: Positive for malaise/fatigue. Negative for weight loss.  Respiratory: Negative for shortness of breath.   Cardiovascular: Negative for chest pain.  Neurological: Negative for headaches.  Endo/Heme/Allergies:       Negative for polyphagia.  Psychiatric/Behavioral: The patient has insomnia.     PHYSICAL EXAM: Blood pressure (!) 138/96, pulse 71, temperature 98.6 F (37 C), temperature source Oral, height 5\' 9"  (1.753 m), weight 263 lb (119.3 kg), SpO2 100 %. Body mass index is 38.84 kg/m. Physical Exam  Constitutional: She is oriented to person, place, and time. She appears well-developed and well-nourished.  Cardiovascular: Normal rate.  Pulmonary/Chest: Effort normal.  Musculoskeletal: Normal range of motion.  Neurological: She is oriented to person, place, and time.  Skin: Skin is warm and dry.  Psychiatric: She has a normal mood and affect. Her behavior is normal.  Vitals reviewed.   RECENT LABS AND TESTS: BMET    Component Value Date/Time   NA 140 11/04/2017 1424   NA 141 06/24/2017 1236   NA 140 11/04/2016 1500   K 3.7 11/04/2017 1424   K 3.8 11/04/2016 1500   CL 106 11/04/2017 1424   CO2 25 11/04/2017 1424   CO2 27 11/04/2016 1500   GLUCOSE 86 11/04/2017 1424   GLUCOSE  88 11/04/2016 1500   BUN 20 11/04/2017 1424   BUN 15 06/24/2017 1236   BUN 17.6 11/04/2016 1500   CREATININE 0.94 11/04/2017 1424   CREATININE 1.1 11/04/2016 1500   CALCIUM 10.2 11/04/2017 1424   CALCIUM 9.6 11/04/2016 1500   GFRNONAA >60 11/04/2017 1424   GFRAA >60 11/04/2017 1424   Lab Results  Component Value Date   HGBA1C 5.1 12/16/2017   HGBA1C 5.3 06/24/2017   Lab Results  Component Value Date   INSULIN 13.5 12/16/2017   INSULIN 6.2 06/24/2017   CBC    Component Value Date/Time   WBC 5.2 11/04/2017 1424   RBC  4.81 11/04/2017 1424   HGB 13.4 11/04/2017 1424   HGB 13.8 06/24/2017 1236   HGB 13.3 11/04/2016 1500   HCT 41.1 11/04/2017 1424   HCT 43.5 06/24/2017 1236   HCT 40.6 11/04/2016 1500   PLT 193 11/04/2017 1424   PLT 208 11/04/2016 1500   MCV 85.4 11/04/2017 1424   MCV 86 06/24/2017 1236   MCV 84.4 11/04/2016 1500   MCH 27.9 11/04/2017 1424   MCHC 32.6 11/04/2017 1424   RDW 13.0 11/04/2017 1424   RDW 14.8 06/24/2017 1236   RDW 13.9 11/04/2016 1500   LYMPHSABS 2.0 11/04/2017 1424   LYMPHSABS 2.5 06/24/2017 1236   LYMPHSABS 2.0 11/04/2016 1500   MONOABS 0.3 11/04/2017 1424   MONOABS 0.3 11/04/2016 1500   EOSABS 0.1 11/04/2017 1424   EOSABS 0.1 06/24/2017 1236   BASOSABS 0.0 11/04/2017 1424   BASOSABS 0.0 06/24/2017 1236   BASOSABS 0.0 11/04/2016 1500   Iron/TIBC/Ferritin/ %Sat No results found for: IRON, TIBC, FERRITIN, IRONPCTSAT Lipid Panel     Component Value Date/Time   CHOL 128 06/24/2017 1236   TRIG 80 06/24/2017 1236   HDL 57 06/24/2017 1236   LDLCALC 55 06/24/2017 1236   Hepatic Function Panel     Component Value Date/Time   PROT 7.8 11/04/2017 1424   PROT 7.4 06/24/2017 1236   PROT 7.5 11/04/2016 1500   ALBUMIN 3.9 11/04/2017 1424   ALBUMIN 4.4 06/24/2017 1236   ALBUMIN 3.8 11/04/2016 1500   AST 14 (L) 11/04/2017 1424   AST 20 11/04/2016 1500   ALT 10 11/04/2017 1424   ALT 15 11/04/2016 1500   ALKPHOS 67 11/04/2017 1424    ALKPHOS 87 11/04/2016 1500   BILITOT 0.4 11/04/2017 1424   BILITOT 0.3 06/24/2017 1236   BILITOT 0.31 11/04/2016 1500      Component Value Date/Time   TSH 2.030 06/24/2017 1236   TSH 0.129 (L) 08/28/2014 1620   Results for Jennifer Frazier, Jennifer Frazier (MRN [id number]) as of 12/21/2017 14:48  Ref. Range 12/16/2017 08:51  Vitamin D, 25-Hydroxy Latest Ref Range: 30.0 - 100.0 ng/mL 59.8   ASSESSMENT AND PLAN: Insulin resistance - Plan: Hemoglobin A1c, Insulin, random  Other fatigue - Plan: VITAMIN D 25 Hydroxy (Vit-D Deficiency, Fractures)  Essential hypertension  Other insomnia - Plan: Ambulatory referral to Sleep Studies  At risk for heart disease  Class 2 severe obesity with serious comorbidity and body mass index (BMI) of 38.0 to 38.9 in adult, unspecified obesity type ([place])  PLAN:  Insulin Resistance Tashera will continue to work on weight loss, exercise, and decreasing simple carbohydrates in her diet to help decrease the risk of diabetes.  She was informed that eating too many simple carbohydrates or too many calories at one sitting increases the likelihood of GI side effects.  We will check her Insulin and A1c today. Bill agreed to follow up with [name] as directed to monitor her progress in 2 weeks.  Fatigue Rada was informed that her fatigue may be related to obesity, depression or many other causes. Candas has agreed to work on diet, exercise and weight loss to help with fatigue. Proper sleep hygiene was discussed including the need for 7-8 hours of quality sleep each night. We will check her vitamin D level today.  Hypertension We discussed sodium restriction, working on healthy weight loss, and a regular exercise program as the means to achieve improved blood pressure control.  We will continue to monitor her blood pressure as well as her progress with  the above lifestyle modifications. She agrees to check her blood pressure 3 times a week and bring her readings to the next visit. She  agrees to continue her medications as prescribed and will watch for signs of hypotension as she continues her lifestyle modifications. Jennifer Frazier agreed with this plan and agreed to follow up as directed.  Cardiovascular risk counseling Jennifer Frazier was given extended (15 minutes) coronary artery disease prevention counseling today. She is 42 y.o. female and has risk factors for heart disease including hypertension and obesity. We discussed intensive lifestyle modifications today with an emphasis on specific weight loss instructions and strategies. Pt was also informed of the importance of increasing exercise and decreasing saturated fats to help prevent heart disease.  Insomnia We discussed sleep hygiene and we will refer Jennifer Frazier to sleep medicine.  Obesity Jennifer Frazier is currently in the action stage of change. As such, her goal is to maintain weight for now. She has agreed to follow the vegetarian plan and keep a food journal of 250 to 350 calories and 25+ grams of protein at breakfast. Jennifer Frazier has been instructed to walk at least 3 times a week for 20 minutes. We discussed the following Behavioral Modification Strategies today: increasing lean protein intake, increase H2O intake, work on meal planning and easy cooking plans, planning for success, and holiday eating strategies.   Jennifer Frazier has agreed to follow up with our clinic in 2 to 3 weeks. She was informed of the importance of frequent follow up visits to maximize her success with intensive lifestyle modifications for her multiple health conditions.   OBESITY BEHAVIORAL INTERVENTION VISIT  Today's visit was # 11   Starting weight: 294 lbs Starting date: 06/24/17 Today's weight : Weight: 263 lb (119.3 kg)  Today's date: 12/16/2017 Total lbs lost to date: 59  ASK: We discussed the diagnosis of obesity with Ernst Breach today and Conya agreed to give [name] permission to discuss obesity behavioral modification therapy today.  ASSESS: Shastina has the diagnosis  of obesity and her BMI today is 38.82. Jennifer Frazier is in the action stage of change.   ADVISE: Jennifer Frazier was educated on the multiple health risks of obesity as well as the benefit of weight loss to improve her health. She was advised of the need for long term treatment and the importance of lifestyle modifications to improve her current health and to decrease her risk of future health problems.  AGREE: Multiple dietary modification options and treatment options were discussed and Unknown agreed to follow the recommendations documented in the above note.  ARRANGE: Jennifer Frazier was educated on the importance of frequent visits to treat obesity as outlined per CMS and USPSTF guidelines and agreed to schedule her next follow up appointment today.  Lenward Chancellor, am acting as Location manager for Georgianne Fick, FNP.  I have reviewed the above documentation for accuracy and completeness, and I agree with the above.  - Deshonna Trnka, FNP-C.

## 2017-12-22 ENCOUNTER — Encounter (INDEPENDENT_AMBULATORY_CARE_PROVIDER_SITE_OTHER): Payer: Self-pay | Admitting: Family Medicine

## 2018-01-04 ENCOUNTER — Encounter (INDEPENDENT_AMBULATORY_CARE_PROVIDER_SITE_OTHER): Payer: Self-pay | Admitting: Family Medicine

## 2018-01-04 ENCOUNTER — Ambulatory Visit (INDEPENDENT_AMBULATORY_CARE_PROVIDER_SITE_OTHER): Payer: 59 | Admitting: Family Medicine

## 2018-01-04 VITALS — BP 157/81 | HR 78 | Temp 98.7°F | Ht 69.0 in | Wt 260.0 lb

## 2018-01-04 DIAGNOSIS — I10 Essential (primary) hypertension: Secondary | ICD-10-CM | POA: Diagnosis not present

## 2018-01-04 DIAGNOSIS — Z9189 Other specified personal risk factors, not elsewhere classified: Secondary | ICD-10-CM | POA: Diagnosis not present

## 2018-01-04 DIAGNOSIS — E8881 Metabolic syndrome: Secondary | ICD-10-CM

## 2018-01-04 DIAGNOSIS — R3581 Nocturnal polyuria: Secondary | ICD-10-CM

## 2018-01-04 DIAGNOSIS — R351 Nocturia: Secondary | ICD-10-CM

## 2018-01-04 DIAGNOSIS — E88819 Insulin resistance, unspecified: Secondary | ICD-10-CM

## 2018-01-04 DIAGNOSIS — Z6838 Body mass index (BMI) 38.0-38.9, adult: Secondary | ICD-10-CM

## 2018-01-04 MED ORDER — AMLODIPINE BESYLATE 10 MG PO TABS: 10.0000 mg | ORAL_TABLET | Freq: Every day | ORAL | 0 refills | Status: DC

## 2018-01-05 ENCOUNTER — Encounter (INDEPENDENT_AMBULATORY_CARE_PROVIDER_SITE_OTHER): Payer: Self-pay | Admitting: Family Medicine

## 2018-01-05 NOTE — Progress Notes (Signed)
Office: [phone]  /  Fax: [phone]   HPI:   Chief Complaint: OBESITY Jennifer Frazier is here to discuss her progress with her obesity treatment plan. She is on the keep a food journal with 250-350 calories and 25+ gram of protein at breakfast daily and follow our protein rich vegetarian plan and is following her eating plan approximately 90% of the time. She states she is walking for 20 minutes 2 times per week. Jennifer Frazier is struggling with hunger at night. She did well on Thanksgiving with eating.[name]  Her weight is 260 lb (117.9 kg) today and has had a weight loss of 3 pounds over a period of 2 to 3 weeks since her last visit. She has lost 34 lbs since starting treatment with [name].  Hypertension Jennifer Frazier is a 42 y.o. female with hypertension. Jennifer Frazier's blood pressures are running in 140's/90's at home. She is compliant with Norvasc 5 mg. She denies chest pain or shortness of breath. She is working weight loss to help control her blood pressure with the goal of decreasing her risk of heart attack and stroke. Jennifer Frazier's blood pressure is not currently controlled.  At risk for cardiovascular disease Jennifer Frazier is at a higher than average risk for cardiovascular disease due to obesity and hypertension. She currently denies any chest pain.  Insulin Resistance Jennifer Frazier has a diagnosis of insulin resistance based on her elevated fasting insulin level >5. Although Jennifer Frazier's blood glucose readings are still under good control, insulin resistance puts her at greater risk of metabolic syndrome and diabetes. She is not taking metformin currently and she notes polyphagia at night and snacks are carbohydrate heavy. I discussed metformin with her but she declined. She continues to work on diet and exercise to decrease risk of diabetes.  Nocturnal Polyuria Jennifer Frazier is urinating several times at night. She reports drinking more diet soda versus water. Last A1c was 5.1 in November 2019. She is not sleeping well, so the need to  void is not waking her up.  ALLERGIES: Allergies  Allergen Reactions  . Gadolinium      Desc: NAUSEA WITH MRI CONTRAST-NO VOMITING   . Tramadol Hives and Nausea Only    MEDICATIONS: Current Outpatient Medications on File Prior to Visit  Medication Sig Dispense Refill  . calcium carbonate (TUMS - DOSED IN MG ELEMENTAL CALCIUM) 500 MG chewable tablet Chew 1 tablet by mouth daily as needed for indigestion or heartburn.    . diphenhydrAMINE (BENADRYL) 25 MG tablet Take 25 mg by mouth every 6 (six) hours as needed.    . tamoxifen (NOLVADEX) 20 MG tablet Take 1 tablet (20 mg total) by mouth daily. 90 tablet 4   No current facility-administered medications on file prior to visit.     PAST MEDICAL HISTORY: Past Medical History:  Diagnosis Date  . Anemia   . Anxiety   . Cancer ([place])    Breast  . Contact lens/glasses fitting    wears contacts or glasses  . Depression   . Fibroids    uterine fibroids  . Hypertension   . Hypothyroidism   . Obesity   . Personal history of chemotherapy   . Personal history of radiation therapy   . Radiation 04/04/14-05/21/14   Right upper-outer breast  . Sleep apnea    has a cpap-does not always use it  . Thyroid disease     PAST SURGICAL HISTORY: Past Surgical History:  Procedure Laterality Date  . BREAST LUMPECTOMY Right 08/03/2013  . BREAST SURGERY  lumpectomy 7/14  . EYE SURGERY  2013   retinal tear-lazer-both  . PORT A CATH REVISION N/A 12/19/2013   Procedure: REPOSITION PORT;  Surgeon: [name], MD;  Location: [facility];  Service: General;  Laterality: N/A;  . PORT-A-CATH REMOVAL Right 08/08/2014   Procedure: REMOVAL PORT-A-CATH;  Surgeon: [name], MD;  Location: [facility];  Service: General;  Laterality: Right;  . PORTACATH PLACEMENT Right 09/29/2013   Procedure: INSERTION PORT-A-CATH WITH ULTRA SOUND;  Surgeon: [name], MD;  Location: [place];  Service: General;   Laterality: Right;  . [place]  2012   urerine ablasion  . WISDOM TOOTH EXTRACTION      SOCIAL HISTORY: Social History   Tobacco Use  . Smoking status: Never Smoker  . Smokeless tobacco: Never Used  Substance Use Topics  . Alcohol use: No  . Drug use: No    FAMILY HISTORY: Family History  Problem Relation Age of Onset  . Cancer Father 41       colon  . High blood pressure Mother   . Stroke Mother   . Anxiety disorder Mother   . Sleep apnea Mother   . Obesity Mother   . Cancer Maternal Aunt 50       breast cancer  . Cancer Paternal Aunt 15       breast cancer  . Cancer Maternal Grandmother 79       ovarian or uterine cancer  . Cancer Paternal Grandmother        unknown primary    ROS: Review of Systems  Constitutional: Positive for weight loss.  Respiratory: Negative for shortness of breath.   Cardiovascular: Negative for chest pain.  Genitourinary: Positive for frequency.  Endo/Heme/Allergies:       Positive polyphagia    PHYSICAL EXAM: Blood pressure (!) 157/81, pulse 78, temperature 98.7 F (37.1 C), temperature source Oral, height 5\' 9"  (1.753 m), weight 260 lb (117.9 kg), SpO2 97 %. Body mass index is 38.4 kg/m. Physical Exam  Constitutional: She is oriented to person, place, and time. She appears well-developed and well-nourished.  Cardiovascular: Normal rate.  Pulmonary/Chest: Effort normal.  Musculoskeletal: Normal range of motion.  Neurological: She is oriented to person, place, and time.  Skin: Skin is warm and dry.  Psychiatric: She has a normal mood and affect. Her behavior is normal.  Vitals reviewed.   RECENT LABS AND TESTS: BMET    Component Value Date/Time   NA 140 11/04/2017 1424   NA 141 06/24/2017 1236   NA 140 11/04/2016 1500   K 3.7 11/04/2017 1424   K 3.8 11/04/2016 1500   CL 106 11/04/2017 1424   CO2 25 11/04/2017 1424   CO2 27 11/04/2016 1500   GLUCOSE 86 11/04/2017 1424   GLUCOSE 88 11/04/2016 1500   BUN 20 11/04/2017  1424   BUN 15 06/24/2017 1236   BUN 17.6 11/04/2016 1500   CREATININE 0.94 11/04/2017 1424   CREATININE 1.1 11/04/2016 1500   CALCIUM 10.2 11/04/2017 1424   CALCIUM 9.6 11/04/2016 1500   GFRNONAA >60 11/04/2017 1424   GFRAA >60 11/04/2017 1424   Lab Results  Component Value Date   HGBA1C 5.1 12/16/2017   HGBA1C 5.3 06/24/2017   Lab Results  Component Value Date   INSULIN 13.5 12/16/2017   INSULIN 6.2 06/24/2017   CBC    Component Value Date/Time   WBC 5.2 11/04/2017 1424   RBC 4.81 11/04/2017 1424   HGB 13.4  11/04/2017 1424   HGB 13.8 06/24/2017 1236   HGB 13.3 11/04/2016 1500   HCT 41.1 11/04/2017 1424   HCT 43.5 06/24/2017 1236   HCT 40.6 11/04/2016 1500   PLT 193 11/04/2017 1424   PLT 208 11/04/2016 1500   MCV 85.4 11/04/2017 1424   MCV 86 06/24/2017 1236   MCV 84.4 11/04/2016 1500   MCH 27.9 11/04/2017 1424   MCHC 32.6 11/04/2017 1424   RDW 13.0 11/04/2017 1424   RDW 14.8 06/24/2017 1236   RDW 13.9 11/04/2016 1500   LYMPHSABS 2.0 11/04/2017 1424   LYMPHSABS 2.5 06/24/2017 1236   LYMPHSABS 2.0 11/04/2016 1500   MONOABS 0.3 11/04/2017 1424   MONOABS 0.3 11/04/2016 1500   EOSABS 0.1 11/04/2017 1424   EOSABS 0.1 06/24/2017 1236   BASOSABS 0.0 11/04/2017 1424   BASOSABS 0.0 06/24/2017 1236   BASOSABS 0.0 11/04/2016 1500   Iron/TIBC/Ferritin/ %Sat No results found for: IRON, TIBC, FERRITIN, IRONPCTSAT Lipid Panel     Component Value Date/Time   CHOL 128 06/24/2017 1236   TRIG 80 06/24/2017 1236   HDL 57 06/24/2017 1236   LDLCALC 55 06/24/2017 1236   Hepatic Function Panel     Component Value Date/Time   PROT 7.8 11/04/2017 1424   PROT 7.4 06/24/2017 1236   PROT 7.5 11/04/2016 1500   ALBUMIN 3.9 11/04/2017 1424   ALBUMIN 4.4 06/24/2017 1236   ALBUMIN 3.8 11/04/2016 1500   AST 14 (L) 11/04/2017 1424   AST 20 11/04/2016 1500   ALT 10 11/04/2017 1424   ALT 15 11/04/2016 1500   ALKPHOS 67 11/04/2017 1424   ALKPHOS 87 11/04/2016 1500   BILITOT 0.4  11/04/2017 1424   BILITOT 0.3 06/24/2017 1236   BILITOT 0.31 11/04/2016 1500      Component Value Date/Time   TSH 2.030 06/24/2017 1236   TSH 0.129 (L) 08/28/2014 1620    ASSESSMENT AND PLAN: Essential hypertension - Plan: Jennifer Frazier (NORVASC) 10 MG tablet  Nocturnal polyuria  Insulin resistance  At risk for heart disease  Class 2 severe obesity with serious comorbidity and body mass index (BMI) of 38.0 to 38.9 in adult, unspecified obesity type (HCC)  PLAN:  Hypertension We discussed sodium restriction, working on healthy weight loss, and a regular exercise program as the means to achieve improved blood pressure control. Jennifer Frazier agreed with this plan and agreed to follow up as directed. We will continue to monitor her blood pressure as well as her progress with the above lifestyle modifications. Jennifer Frazier agrees to increase Norvasc to 10 mg q daily #30 with no refills. She will watch for signs of hypotension as she continues her lifestyle modifications. Jennifer Frazier agrees to follow up with our clinic in 2 to 3 weeks.  Cardiovascular risk counselling Ardelia was given extended (15 minutes) coronary artery disease prevention counseling today. She is 42 y.o. female and has risk factors for heart disease including obesity and hypertension. We discussed intensive lifestyle modifications today with an emphasis on specific weight loss instructions and strategies. Pt was also informed of the importance of increasing exercise and decreasing saturated fats to help prevent heart disease.  Insulin Resistance Jennifer Frazier will continue to work on weight loss, diet, exercise, and decreasing simple carbohydrates in her diet to help decrease the risk of diabetes. She is to have protein rich snacks at night versus carbohydrate rich. We dicussed metformin including benefits and risks. She was informed that eating too many simple carbohydrates or too many calories at one sitting increases the  likelihood of GI side effects.  Jennifer Frazier declined metformin for now and prescription was not written today. Jennifer Frazier agrees to follow up with our clinic in 2 to 3 weeks as directed to monitor her progress.  Nocturnal Polyuria Jennifer Frazier is to decrease diet soda and stop drinking fluids 2-3 hours before bedtime. Jennifer Frazier agrees to follow up with our clinic in 2 to 3 weeks.  Obesity Jennifer Frazier is currently in the action stage of change. As such, her goal is to continue with weight loss efforts She has agreed to keep a food journal with 250-350 calories and 25+ grams of protein at breakfast daily, keep a food journal with 400 calories and 25-30 grams of protein at lunch daily and follow our protein rich vegetarian plan Jennifer Frazier will continue current exercise regimen for weight loss and overall health benefits. We discussed the following Behavioral Modification Strategies today: increasing lean protein intake, celebration eating strategies, and planning for success    Jennifer Frazier has agreed to follow up with our clinic in 2 to 3 weeks. She was informed of the importance of frequent follow up visits to maximize her success with intensive lifestyle modifications for her multiple health conditions.   OBESITY BEHAVIORAL INTERVENTION VISIT  Today's visit was # 12  Starting weight: 294 lbs Starting date: 06/24/17 Today's weight : 260 lbs  Today's date: 01/04/2018 Total lbs lost to date: 57    ASK: We discussed the diagnosis of obesity with Jennifer Frazier today and Jennifer Frazier agreed to give [name] permission to discuss obesity behavioral modification therapy today.  ASSESS: Jennifer Frazier has the diagnosis of obesity and her BMI today is 38.38 Jennifer Frazier is in the action stage of change   ADVISE: Jennifer Frazier was educated on the multiple health risks of obesity as well as the benefit of weight loss to improve her health. She was advised of the need for long term treatment and the importance of lifestyle modifications to improve her current health and to decrease her risk of future  health problems.  AGREE: Multiple dietary modification options and treatment options were discussed and  Jennifer Frazier agreed to follow the recommendations documented in the above note.  ARRANGE: Tyaisha was educated on the importance of frequent visits to treat obesity as outlined per CMS and USPSTF guidelines and agreed to schedule her next follow up appointment today.  Wilhemena Durie, am acting as Location manager for [employer], FNP-C.  I have reviewed the above documentation for accuracy and completeness, and I agree with the above.  - Brynnlie Unterreiner, FNP-C.

## 2018-01-05 NOTE — Addendum Note (Signed)
Addended by: Wilfrid Lund on: 01/05/2018 01:32 PM   Modules accepted: Orders

## 2018-01-24 ENCOUNTER — Encounter (INDEPENDENT_AMBULATORY_CARE_PROVIDER_SITE_OTHER): Payer: Self-pay

## 2018-01-24 ENCOUNTER — Ambulatory Visit (INDEPENDENT_AMBULATORY_CARE_PROVIDER_SITE_OTHER): Payer: 59 | Admitting: Family Medicine

## 2018-02-04 ENCOUNTER — Other Ambulatory Visit (INDEPENDENT_AMBULATORY_CARE_PROVIDER_SITE_OTHER): Payer: Self-pay | Admitting: Family Medicine

## 2018-02-04 DIAGNOSIS — I10 Essential (primary) hypertension: Secondary | ICD-10-CM

## 2018-02-07 ENCOUNTER — Other Ambulatory Visit (INDEPENDENT_AMBULATORY_CARE_PROVIDER_SITE_OTHER): Payer: Self-pay

## 2018-02-07 DIAGNOSIS — I10 Essential (primary) hypertension: Secondary | ICD-10-CM

## 2018-02-07 MED ORDER — AMLODIPINE BESYLATE 10 MG PO TABS: 10.0000 mg | ORAL_TABLET | Freq: Every day | ORAL | 0 refills | Status: DC

## 2018-02-10 ENCOUNTER — Encounter (INDEPENDENT_AMBULATORY_CARE_PROVIDER_SITE_OTHER): Payer: Self-pay | Admitting: Family Medicine

## 2018-02-10 ENCOUNTER — Ambulatory Visit (INDEPENDENT_AMBULATORY_CARE_PROVIDER_SITE_OTHER): Payer: 59 | Admitting: Family Medicine

## 2018-02-10 VITALS — BP 120/84 | HR 79 | Temp 98.1°F | Ht 69.0 in | Wt 251.0 lb

## 2018-02-10 DIAGNOSIS — I10 Essential (primary) hypertension: Secondary | ICD-10-CM | POA: Diagnosis not present

## 2018-02-10 DIAGNOSIS — G4709 Other insomnia: Secondary | ICD-10-CM | POA: Diagnosis not present

## 2018-02-10 DIAGNOSIS — Z9189 Other specified personal risk factors, not elsewhere classified: Secondary | ICD-10-CM | POA: Diagnosis not present

## 2018-02-10 DIAGNOSIS — Z6837 Body mass index (BMI) 37.0-37.9, adult: Secondary | ICD-10-CM

## 2018-02-10 MED ORDER — AMLODIPINE BESYLATE 10 MG PO TABS: 10.0000 mg | ORAL_TABLET | Freq: Every day | ORAL | 0 refills | Status: DC

## 2018-02-11 NOTE — Progress Notes (Signed)
Office: [phone]  /  Fax: [phone]   HPI:   Chief Complaint: OBESITY Jennifer Frazier is here to discuss her progress with her obesity treatment plan. She is on the keep a food journal with 250-350 calories and 25+ grams of protein at breakfast daily, journal with 400 calories and 25-30 grams of protein at lunch daily  and follow our protein rich vegetarian plan for supper and is following her eating plan approximately 100% of the time. She states she is walking for 20 minutes 3 times per week. Jennifer Frazier is adhering well to the plan. She has been increasing liquids. She has been reaching protein goal daily.  Her weight is 251 lb (113.9 kg) today and has had a weight loss of 9 pounds over a period of 5 to 6 weeks since her last visit. She has lost 43 lbs since starting treatment with [name].  Hypertension Jennifer Frazier is a 43 y.o. female with hypertension. Jennifer Frazier's blood pressure is well controlled on Norvasc. She denies chest pain or shortness of breath. She is working weight loss to help control her blood pressure with the goal of decreasing her risk of heart attack and stroke.   At risk for cardiovascular disease Jennifer Frazier is at a higher than average risk for cardiovascular disease due to obesity and hypertension. She currently denies any chest pain.  Insomnia Jennifer Frazier complains of insomnia. She states melatonin has not worked. She states benadryl does help but she does not want to take it every night. She has not slept well since her father passed away and her mom had a stroke.  ALLERGIES: Allergies  Allergen Reactions  . Gadolinium      Desc: NAUSEA WITH MRI CONTRAST-NO VOMITING   . Tramadol Hives and Nausea Only    MEDICATIONS: Current Outpatient Medications on File Prior to Visit  Medication Sig Dispense Refill  . calcium carbonate (TUMS - DOSED IN MG ELEMENTAL CALCIUM) 500 MG chewable tablet Chew 1 tablet by mouth daily as needed for indigestion or heartburn.    . diphenhydrAMINE (BENADRYL)  25 MG tablet Take 25 mg by mouth every 6 (six) hours as needed.    . tamoxifen (NOLVADEX) 20 MG tablet Take 1 tablet (20 mg total) by mouth daily. 90 tablet 4   No current facility-administered medications on file prior to visit.     PAST MEDICAL HISTORY: Past Medical History:  Diagnosis Date  . Anemia   . Anxiety   . Cancer ([place])    Breast  . Contact lens/glasses fitting    wears contacts or glasses  . Depression   . Fibroids    uterine fibroids  . Hypertension   . Hypothyroidism   . Obesity   . Personal history of chemotherapy   . Personal history of radiation therapy   . Radiation 04/04/14-05/21/14   Right upper-outer breast  . Sleep apnea    has a cpap-does not always use it  . Thyroid disease     PAST SURGICAL HISTORY: Past Surgical History:  Procedure Laterality Date  . BREAST LUMPECTOMY Right 08/03/2013  . BREAST SURGERY     lumpectomy 7/14  . EYE SURGERY  2013   retinal tear-lazer-both  . PORT A CATH REVISION N/A 12/19/2013   Procedure: REPOSITION PORT;  Surgeon: [name], MD;  Location: [facility];  Service: General;  Laterality: N/A;  . PORT-A-CATH REMOVAL Right 08/08/2014   Procedure: REMOVAL PORT-A-CATH;  Surgeon: [name], MD;  Location: [facility];  Service:  General;  Laterality: Right;  . PORTACATH PLACEMENT Right 09/29/2013   Procedure: INSERTION PORT-A-CATH WITH ULTRA SOUND;  Surgeon: [name], MD;  Location: [facility];  Service: General;  Laterality: Right;  . [place]  2012   urerine ablasion  . WISDOM TOOTH EXTRACTION      SOCIAL HISTORY: Social History   Tobacco Use  . Smoking status: Never Smoker  . Smokeless tobacco: Never Used  Substance Use Topics  . Alcohol use: No  . Drug use: No    FAMILY HISTORY: Family History  Problem Relation Age of Onset  . Cancer Father 63       colon  . High blood pressure Mother   . Stroke Mother   . Anxiety disorder Mother   . Sleep apnea  Mother   . Obesity Mother   . Cancer Maternal Aunt 50       breast cancer  . Cancer Paternal Aunt 90       breast cancer  . Cancer Maternal Grandmother 42       ovarian or uterine cancer  . Cancer Paternal Grandmother        unknown primary    ROS: Review of Systems  Constitutional: Positive for weight loss.  Respiratory: Negative for shortness of breath.   Cardiovascular: Negative for chest pain.  Psychiatric/Behavioral: The patient has insomnia.     PHYSICAL EXAM: Blood pressure 120/84, pulse 79, temperature 98.1 F (36.7 C), temperature source Oral, height 5\' 9"  (1.753 m), weight 251 lb (113.9 kg), SpO2 98 %. Body mass index is 37.07 kg/m. Physical Exam Vitals signs reviewed.  Constitutional:      Appearance: Normal appearance. She is obese.  Cardiovascular:     Rate and Rhythm: Normal rate.     Pulses: Normal pulses.  Pulmonary:     Effort: Pulmonary effort is normal.     Breath sounds: Normal breath sounds.  Musculoskeletal: Normal range of motion.  Skin:    General: Skin is warm and dry.  Neurological:     Mental Status: She is alert and oriented to person, place, and time.  Psychiatric:        Mood and Affect: Mood normal.        Behavior: Behavior normal.     RECENT LABS AND TESTS: BMET    Component Value Date/Time   NA 140 11/04/2017 1424   NA 141 06/24/2017 1236   NA 140 11/04/2016 1500   K 3.7 11/04/2017 1424   K 3.8 11/04/2016 1500   CL 106 11/04/2017 1424   CO2 25 11/04/2017 1424   CO2 27 11/04/2016 1500   GLUCOSE 86 11/04/2017 1424   GLUCOSE 88 11/04/2016 1500   BUN 20 11/04/2017 1424   BUN 15 06/24/2017 1236   BUN 17.6 11/04/2016 1500   CREATININE 0.94 11/04/2017 1424   CREATININE 1.1 11/04/2016 1500   CALCIUM 10.2 11/04/2017 1424   CALCIUM 9.6 11/04/2016 1500   GFRNONAA >60 11/04/2017 1424   GFRAA >60 11/04/2017 1424   Lab Results  Component Value Date   HGBA1C 5.1 12/16/2017   HGBA1C 5.3 06/24/2017   Lab Results  Component  Value Date   INSULIN 13.5 12/16/2017   INSULIN 6.2 06/24/2017   CBC    Component Value Date/Time   WBC 5.2 11/04/2017 1424   RBC 4.81 11/04/2017 1424   HGB 13.4 11/04/2017 1424   HGB 13.8 06/24/2017 1236   HGB 13.3 11/04/2016 1500   HCT 41.1 11/04/2017 1424  HCT 43.5 06/24/2017 1236   HCT 40.6 11/04/2016 1500   PLT 193 11/04/2017 1424   PLT 208 11/04/2016 1500   MCV 85.4 11/04/2017 1424   MCV 86 06/24/2017 1236   MCV 84.4 11/04/2016 1500   MCH 27.9 11/04/2017 1424   MCHC 32.6 11/04/2017 1424   RDW 13.0 11/04/2017 1424   RDW 14.8 06/24/2017 1236   RDW 13.9 11/04/2016 1500   LYMPHSABS 2.0 11/04/2017 1424   LYMPHSABS 2.5 06/24/2017 1236   LYMPHSABS 2.0 11/04/2016 1500   MONOABS 0.3 11/04/2017 1424   MONOABS 0.3 11/04/2016 1500   EOSABS 0.1 11/04/2017 1424   EOSABS 0.1 06/24/2017 1236   BASOSABS 0.0 11/04/2017 1424   BASOSABS 0.0 06/24/2017 1236   BASOSABS 0.0 11/04/2016 1500   Iron/TIBC/Ferritin/ %Sat No results found for: IRON, TIBC, FERRITIN, IRONPCTSAT Lipid Panel     Component Value Date/Time   CHOL 128 06/24/2017 1236   TRIG 80 06/24/2017 1236   HDL 57 06/24/2017 1236   LDLCALC 55 06/24/2017 1236   Hepatic Function Panel     Component Value Date/Time   PROT 7.8 11/04/2017 1424   PROT 7.4 06/24/2017 1236   PROT 7.5 11/04/2016 1500   ALBUMIN 3.9 11/04/2017 1424   ALBUMIN 4.4 06/24/2017 1236   ALBUMIN 3.8 11/04/2016 1500   AST 14 (L) 11/04/2017 1424   AST 20 11/04/2016 1500   ALT 10 11/04/2017 1424   ALT 15 11/04/2016 1500   ALKPHOS 67 11/04/2017 1424   ALKPHOS 87 11/04/2016 1500   BILITOT 0.4 11/04/2017 1424   BILITOT 0.3 06/24/2017 1236   BILITOT 0.31 11/04/2016 1500      Component Value Date/Time   TSH 2.030 06/24/2017 1236   TSH 0.129 (L) 08/28/2014 1620    ASSESSMENT AND PLAN: Essential hypertension - Plan: amLODipine (NORVASC) 10 MG tablet  Other insomnia  At risk for heart disease  Class 2 severe obesity with serious comorbidity and  body mass index (BMI) of 37.0 to 37.9 in adult, unspecified obesity type (HCC)  PLAN:  Hypertension We discussed sodium restriction, working on healthy weight loss, and a regular exercise program as the means to achieve improved blood pressure control. Jennifer Frazier agreed with this plan and agreed to follow up as directed. We will continue to monitor her blood pressure as well as her progress with the above lifestyle modifications. Jennifer Frazier agrees to continue taking amlodipine 10 mg daily #30 and we will refill for 1 month. She will watch for signs of hypotension as she continues her lifestyle modifications. Jennifer Frazier agrees to follow up with our clinic in 3 weeks.  Cardiovascular risk counselling Jennifer Frazier was given extended (15 minutes) coronary artery disease prevention counseling today. She is 43 y.o. female and has risk factors for heart disease including obesity and hypertension. We discussed intensive lifestyle modifications today with an emphasis on specific weight loss instructions and strategies. Pt was also informed of the importance of increasing exercise and decreasing saturated fats to help prevent heart disease.  Insomnia The problem of recurrent insomnia and good sleep hygiene was discussed. Avoidance of caffeine sources was strongly encouraged and sleep hygiene issues were reviewed. Jennifer Frazier agrees to follow up with our clinic in 3 weeks.  Obesity Jennifer Frazier is currently in the action stage of change. As such, her goal is to continue with weight loss efforts She has agreed to keep a food journal with 250-350 calories and 25+ grams of protein at breakfast daily, journal 300-400 calories and 25-30 grams of protein at lunch  daily and follow our protein rich vegetarian plan at dinner daily Jennifer Frazier will continue current exercise regimen for weight loss and overall health benefits. She will add resistance training 2 times per week for weight loss and overall health benefits. We discussed the following Behavioral  Modification Strategies today: increasing lean protein intake and planning for success   Jennifer Frazier has agreed to follow up with our clinic in 3 weeks. She was informed of the importance of frequent follow up visits to maximize her success with intensive lifestyle modifications for her multiple health conditions.   OBESITY BEHAVIORAL INTERVENTION VISIT  Today's visit was # 13  Starting weight: 294 lbs Starting date: 06/24/17 Today's weight : 251 lbs  Today's date: 02/10/2018 Total lbs lost to date: 24    ASK: We discussed the diagnosis of obesity with Ernst Breach today and Jennifer Frazier agreed to give [name] permission to discuss obesity behavioral modification therapy today.  ASSESS: Jennifer Frazier has the diagnosis of obesity and her BMI today is 37.05 Jennifer Frazier is in the action stage of change   ADVISE: Jennifer Frazier was educated on the multiple health risks of obesity as well as the benefit of weight loss to improve her health. She was advised of the need for long term treatment and the importance of lifestyle modifications to improve her current health and to decrease her risk of future health problems.  AGREE: Multiple dietary modification options and treatment options were discussed and  Jennifer Frazier agreed to follow the recommendations documented in the above note.  ARRANGE: Jennifer Frazier was educated on the importance of frequent visits to treat obesity as outlined per CMS and USPSTF guidelines and agreed to schedule her next follow up appointment today.  Jennifer Frazier, am acting as Location manager for [employer], FNP-C.  I have reviewed the above documentation for accuracy and completeness, and I agree with the above.  - Jennifer Carrizales, FNP-C.

## 2018-02-15 ENCOUNTER — Encounter (INDEPENDENT_AMBULATORY_CARE_PROVIDER_SITE_OTHER): Payer: Self-pay | Admitting: Family Medicine

## 2018-02-15 DIAGNOSIS — Z6836 Body mass index (BMI) 36.0-36.9, adult: Secondary | ICD-10-CM

## 2018-02-15 DIAGNOSIS — E669 Obesity, unspecified: Secondary | ICD-10-CM | POA: Insufficient documentation

## 2018-03-07 ENCOUNTER — Encounter (INDEPENDENT_AMBULATORY_CARE_PROVIDER_SITE_OTHER): Payer: Self-pay | Admitting: Family Medicine

## 2018-03-07 ENCOUNTER — Ambulatory Visit (INDEPENDENT_AMBULATORY_CARE_PROVIDER_SITE_OTHER): Payer: 59 | Admitting: Family Medicine

## 2018-03-07 VITALS — BP 126/82 | HR 78 | Temp 98.2°F | Ht 69.0 in | Wt 251.0 lb

## 2018-03-07 DIAGNOSIS — E66812 Obesity, class 2: Secondary | ICD-10-CM

## 2018-03-07 DIAGNOSIS — Z6837 Body mass index (BMI) 37.0-37.9, adult: Secondary | ICD-10-CM

## 2018-03-07 DIAGNOSIS — Z9189 Other specified personal risk factors, not elsewhere classified: Secondary | ICD-10-CM

## 2018-03-07 DIAGNOSIS — G4709 Other insomnia: Secondary | ICD-10-CM

## 2018-03-07 DIAGNOSIS — I10 Essential (primary) hypertension: Secondary | ICD-10-CM | POA: Diagnosis not present

## 2018-03-07 MED ORDER — AMLODIPINE BESYLATE 10 MG PO TABS: 10.0000 mg | ORAL_TABLET | Freq: Every day | ORAL | 0 refills | Status: DC

## 2018-03-07 NOTE — Progress Notes (Signed)
Office: [phone]  /  Fax: [phone]   HPI:   Chief Complaint: OBESITY Jennifer Frazier is here to discuss her progress with her obesity treatment plan. She is on the  keep a food journal with 350 to 400 calories and 25+ grams of protein for breakfast or lunch or the vegetarian plan and is following her eating plan approximately 100 % of the time. She states she is exercising 0 minutes 0 times per week. She had been exercising but had stopped recently. Jennifer Frazier denies excessive hunger. She is disappointed that she has not lost weigh this visit because she complied with the meal plan 100%.  Her weight is 251 lb (113.9 kg) today and has not lost weight since her last visit. She has lost 43 lbs since starting treatment with [name].  Hypertension ARMELLA STOGNER is a 43 y.o. female with hypertension. [name]'s blood pressure is currently well controlled on Norvasc. She is working on weight loss to help control her blood pressure with the goal of decreasing her risk of heart attack and stroke. [name] denies chest pain or shortness of breath on exertion.  At risk for cardiovascular disease [name] is at a higher than average risk for cardiovascular disease due to hypertension and obesity. She currently denies any chest pain.  Insomnia [name]'s insomnia has been an issue for her since her father passed away and her mom had a stroke. She states melatonin has not worked. She states benadryl does help but she does not want to take it every night.. She has an evaluation with a sleep medicine provider tomorrow.  ASSESSMENT AND PLAN:  Essential hypertension - Plan: amLODipine (NORVASC) 10 MG tablet  Other insomnia  At risk for heart disease  Class 2 severe obesity with serious comorbidity and body mass index (BMI) of 37.0 to 37.9 in adult, unspecified obesity type ([place])  PLAN:  Hypertension We discussed sodium restriction, working on healthy weight loss, and a regular exercise program as the means to achieve  improved blood pressure control. We will continue to monitor her blood pressure as well as her progress with the above lifestyle modifications. She will continue her amlodipine 10mg  daily #30 with no refills and will watch for signs of hypotension as she continues her lifestyle modifications.  Jennifer Frazier agreed with this plan and agreed to follow up as directed in 3 weeks.  Cardiovascular risk counseling [name] was given extended (15 minutes) coronary artery disease prevention counseling today. She is 43 y.o. female and has risk factors for heart disease including hypertension and obesity. We discussed intensive lifestyle modifications today with an emphasis on specific weight loss instructions and strategies. Pt was also informed of the importance of increasing exercise and decreasing saturated fats to help prevent heart disease.  Insomnia The problem of recurrent insomnia was discussed. Good sleep hygiene issues were reviewed. Jennifer Frazier will see a sleep medicine provider tomorrow for initial evaluation.  Obesity Jennifer Frazier is currently in the action stage of change. As such, her goal is to continue with weight loss efforts. She has agreed to keep a food journal with 1100 to1300 calories and 80 to 90 grams of protein daily or the vegetarian plan. Jennifer Frazier has been instructed to add exercise back in to help break the plateau. She can do cardio for 30 minutes 3 times a week and resistance training 2 times per week. We discussed the following Behavioral Modification Strategies today: planning for success and keeping a strict food journal.  Jennifer Frazier has agreed to follow up with  our clinic in 3 weeks. She was informed of the importance of frequent follow up visits to maximize her success with intensive lifestyle modifications for her multiple health conditions.  ALLERGIES: Allergies  Allergen Reactions  . Gadolinium      Desc: NAUSEA WITH MRI CONTRAST-NO VOMITING   . Tramadol Hives and Nausea Only     MEDICATIONS: Current Outpatient Medications on File Prior to Visit  Medication Sig Dispense Refill  . calcium carbonate (TUMS - DOSED IN MG ELEMENTAL CALCIUM) 500 MG chewable tablet Chew 1 tablet by mouth daily as needed for indigestion or heartburn.    . diphenhydrAMINE (BENADRYL) 25 MG tablet Take 25 mg by mouth every 6 (six) hours as needed.    . tamoxifen (NOLVADEX) 20 MG tablet Take 1 tablet (20 mg total) by mouth daily. 90 tablet 4   No current facility-administered medications on file prior to visit.     PAST MEDICAL HISTORY: Past Medical History:  Diagnosis Date  . Anemia   . Anxiety   . Cancer ([place])    Breast  . Contact lens/glasses fitting    wears contacts or glasses  . Depression   . Fibroids    uterine fibroids  . Hypertension   . Hypothyroidism   . Obesity   . Personal history of chemotherapy   . Personal history of radiation therapy   . Radiation 04/04/14-05/21/14   Right upper-outer breast  . Sleep apnea    has a cpap-does not always use it  . Thyroid disease     PAST SURGICAL HISTORY: Past Surgical History:  Procedure Laterality Date  . BREAST LUMPECTOMY Right 08/03/2013  . BREAST SURGERY     lumpectomy 7/14  . EYE SURGERY  2013   retinal tear-lazer-both  . PORT A CATH REVISION N/A 12/19/2013   Procedure: REPOSITION PORT;  Surgeon: [name], MD;  Location: [facility];  Service: General;  Laterality: N/A;  . PORT-A-CATH REMOVAL Right 08/08/2014   Procedure: REMOVAL PORT-A-CATH;  Surgeon: [name], MD;  Location: [facility];  Service: General;  Laterality: Right;  . PORTACATH PLACEMENT Right 09/29/2013   Procedure: INSERTION PORT-A-CATH WITH ULTRA SOUND;  Surgeon: [name], MD;  Location: [facility];  Service: General;  Laterality: Right;  . [place]  2012   urerine ablasion  . WISDOM TOOTH EXTRACTION      SOCIAL HISTORY: Social History   Tobacco Use  . Smoking status: Never Smoker  .  Smokeless tobacco: Never Used  Substance Use Topics  . Alcohol use: No  . Drug use: No    FAMILY HISTORY: Family History  Problem Relation Age of Onset  . Cancer Father 19       colon  . High blood pressure Mother   . Stroke Mother   . Anxiety disorder Mother   . Sleep apnea Mother   . Obesity Mother   . Cancer Maternal Aunt 50       breast cancer  . Cancer Paternal Aunt 70       breast cancer  . Cancer Maternal Grandmother 11       ovarian or uterine cancer  . Cancer Paternal Grandmother        unknown primary    ROS: Review of Systems  Constitutional: Negative for weight loss.  Respiratory: Negative for shortness of breath.   Cardiovascular: Negative for chest pain.  Psychiatric/Behavioral: The patient has insomnia.     PHYSICAL EXAM: Blood pressure 126/82, pulse 78,  temperature 98.2 F (36.8 C), temperature source Oral, height 5\' 9"  (1.753 m), weight 251 lb (113.9 kg), SpO2 98 %. Body mass index is 37.07 kg/m. Physical Exam Vitals signs reviewed.  Constitutional:      Appearance: Normal appearance. She is obese.  Cardiovascular:     Rate and Rhythm: Normal rate.  Pulmonary:     Effort: Pulmonary effort is normal.  Musculoskeletal: Normal range of motion.  Skin:    General: Skin is warm and dry.  Neurological:     Mental Status: She is alert and oriented to person, place, and time.  Psychiatric:        Mood and Affect: Mood normal.        Behavior: Behavior normal.     RECENT LABS AND TESTS: BMET    Component Value Date/Time   NA 140 11/04/2017 1424   NA 141 06/24/2017 1236   NA 140 11/04/2016 1500   K 3.7 11/04/2017 1424   K 3.8 11/04/2016 1500   CL 106 11/04/2017 1424   CO2 25 11/04/2017 1424   CO2 27 11/04/2016 1500   GLUCOSE 86 11/04/2017 1424   GLUCOSE 88 11/04/2016 1500   BUN 20 11/04/2017 1424   BUN 15 06/24/2017 1236   BUN 17.6 11/04/2016 1500   CREATININE 0.94 11/04/2017 1424   CREATININE 1.1 11/04/2016 1500   CALCIUM 10.2  11/04/2017 1424   CALCIUM 9.6 11/04/2016 1500   GFRNONAA >60 11/04/2017 1424   GFRAA >60 11/04/2017 1424   Lab Results  Component Value Date   HGBA1C 5.1 12/16/2017   HGBA1C 5.3 06/24/2017   Lab Results  Component Value Date   INSULIN 13.5 12/16/2017   INSULIN 6.2 06/24/2017   CBC    Component Value Date/Time   WBC 5.2 11/04/2017 1424   RBC 4.81 11/04/2017 1424   HGB 13.4 11/04/2017 1424   HGB 13.8 06/24/2017 1236   HGB 13.3 11/04/2016 1500   HCT 41.1 11/04/2017 1424   HCT 43.5 06/24/2017 1236   HCT 40.6 11/04/2016 1500   PLT 193 11/04/2017 1424   PLT 208 11/04/2016 1500   MCV 85.4 11/04/2017 1424   MCV 86 06/24/2017 1236   MCV 84.4 11/04/2016 1500   MCH 27.9 11/04/2017 1424   MCHC 32.6 11/04/2017 1424   RDW 13.0 11/04/2017 1424   RDW 14.8 06/24/2017 1236   RDW 13.9 11/04/2016 1500   LYMPHSABS 2.0 11/04/2017 1424   LYMPHSABS 2.5 06/24/2017 1236   LYMPHSABS 2.0 11/04/2016 1500   MONOABS 0.3 11/04/2017 1424   MONOABS 0.3 11/04/2016 1500   EOSABS 0.1 11/04/2017 1424   EOSABS 0.1 06/24/2017 1236   BASOSABS 0.0 11/04/2017 1424   BASOSABS 0.0 06/24/2017 1236   BASOSABS 0.0 11/04/2016 1500   Iron/TIBC/Ferritin/ %Sat No results found for: IRON, TIBC, FERRITIN, IRONPCTSAT Lipid Panel     Component Value Date/Time   CHOL 128 06/24/2017 1236   TRIG 80 06/24/2017 1236   HDL 57 06/24/2017 1236   LDLCALC 55 06/24/2017 1236   Hepatic Function Panel     Component Value Date/Time   PROT 7.8 11/04/2017 1424   PROT 7.4 06/24/2017 1236   PROT 7.5 11/04/2016 1500   ALBUMIN 3.9 11/04/2017 1424   ALBUMIN 4.4 06/24/2017 1236   ALBUMIN 3.8 11/04/2016 1500   AST 14 (L) 11/04/2017 1424   AST 20 11/04/2016 1500   ALT 10 11/04/2017 1424   ALT 15 11/04/2016 1500   ALKPHOS 67 11/04/2017 1424   ALKPHOS 87 11/04/2016 1500  BILITOT 0.4 11/04/2017 1424   BILITOT 0.3 06/24/2017 1236   BILITOT 0.31 11/04/2016 1500      Component Value Date/Time   TSH 2.030 06/24/2017 1236    TSH 0.129 (L) 08/28/2014 1620   Results for YARDEN, MANUELITO (MRN [id number]) as of 03/07/2018 18:15  Ref. Range 12/16/2017 08:51  Vitamin D, 25-Hydroxy Latest Ref Range: 30.0 - 100.0 ng/mL 59.8    OBESITY BEHAVIORAL INTERVENTION VISIT  Today's visit was # 14   Starting weight: 294 lbs Starting date: 06/24/17 Today's weight : Weight: 251 lb (113.9 kg)  Today's date: 03/07/2018 Total lbs lost to date: 30  ASK: We discussed the diagnosis of obesity with Ernst Breach today and [name] agreed to give [name] permission to discuss obesity behavioral modification therapy today.  ASSESS: Itsel has the diagnosis of obesity and her BMI today is 37.0 Donnae is in the action stage of change.   ADVISE: Varvara was educated on the multiple health risks of obesity as well as the benefit of weight loss to improve her health. She was advised of the need for long term treatment and the importance of lifestyle modifications to improve her current health and to decrease her risk of future health problems.  AGREE: Multiple dietary modification options and treatment options were discussed and Seanne agreed to follow the recommendations documented in the above note.  ARRANGE: Meilyn was educated on the importance of frequent visits to treat obesity as outlined per CMS and USPSTF guidelines and agreed to schedule her next follow up appointment today.  Lenward Chancellor, CMA, am acting as Location manager for [employer], FNP-C.  I have reviewed the above documentation for accuracy and completeness, and I agree with the above.  -  , FNP-C.

## 2018-03-08 ENCOUNTER — Ambulatory Visit (INDEPENDENT_AMBULATORY_CARE_PROVIDER_SITE_OTHER): Payer: 59 | Admitting: Neurology

## 2018-03-08 ENCOUNTER — Encounter (INDEPENDENT_AMBULATORY_CARE_PROVIDER_SITE_OTHER): Payer: Self-pay | Admitting: Family Medicine

## 2018-03-08 ENCOUNTER — Encounter: Payer: Self-pay | Admitting: Neurology

## 2018-03-08 VITALS — BP 132/70 | HR 66 | Ht 69.0 in | Wt 255.0 lb

## 2018-03-08 DIAGNOSIS — G4733 Obstructive sleep apnea (adult) (pediatric): Secondary | ICD-10-CM

## 2018-03-08 DIAGNOSIS — T451X5A Adverse effect of antineoplastic and immunosuppressive drugs, initial encounter: Secondary | ICD-10-CM

## 2018-03-08 DIAGNOSIS — G4709 Other insomnia: Secondary | ICD-10-CM

## 2018-03-08 DIAGNOSIS — I10 Essential (primary) hypertension: Secondary | ICD-10-CM

## 2018-03-08 DIAGNOSIS — E039 Hypothyroidism, unspecified: Secondary | ICD-10-CM

## 2018-03-08 DIAGNOSIS — D0510 Intraductal carcinoma in situ of unspecified breast: Secondary | ICD-10-CM

## 2018-03-08 DIAGNOSIS — D701 Agranulocytosis secondary to cancer chemotherapy: Secondary | ICD-10-CM | POA: Diagnosis not present

## 2018-03-08 NOTE — Addendum Note (Signed)
Addended by: Larey Seat on: 03/08/2018 03:48 PM   Modules accepted: Orders

## 2018-03-08 NOTE — Progress Notes (Signed)
SLEEP MEDICINE CLINIC   Provider:  Larey Seat, M D  Primary Care Physician:  Sharilyn Sites, MD   Referring Provider: Grandville Silos, MD and NP [facility]   Chief Complaint  Patient presents with  . New Patient (Initial Visit)    pt alone, rm 10. pt states that since around 2008 she hasnt had adequate amt of sleep. she doesnt feel relaxed enough to sleep. pt states that she averages only about 2-3 hrs a night uninterupted. she had a sleep study completed around 2013 /14. she states that it did indicate apnea and she wore CPAP but was unable to tolerate.  she tried Ambien in past but didnt sleep through the night. Bruno,Casey, RN.     HPI:  Jennifer Frazier is a 43 y.o. female Patient , seen here on 03-08-2018 in a referral from Dr. Adair Patter .  The patient is  Followed by the medical weight management clinic and dx with insulin resistance and obesity, HTN.   Chief complaint according to patient : "I am fatigued, and can't sleep through the  night" .  Jennifer Frazier reports that that she can go to sleep she usually can stay asleep for long and estimates her low her average sleep time only about 3 hours each night.  Paradoxically she feels that she would get the best sleep in the morning hours minutes just time to rise and she has a similar feeling about nap times. She had been diagnosed in the past with obstructive sleep apnea probably 8 years ago somewhere in that timeframe but could not get used to CPAP.  She is here for new evaluation.   Sleep habits are as follows: Dinnertime and her family is between 79 and 7 PM, the evening hours and usually spent watching TV in the living room. Between 10 and 11:00 she is usually ready to go to bed, and she reports that between 30 and 40 minutes is usually sleep latency, while a TV is running in the background. She may sleep for a time between 1 and 3 hours duration but then nearly every night wakes up.  After this arousal it is very difficult for her to  go back to sleep and the rest of the night while she stays in bed she may watch TV again but she feels that she only dozes off for small portions of sleep.   She has to rise on a weekday at 7:30 AM, and she is usually already spontaneously awake at this time.  She sleeps alone and there is no witness to her immediate sleep habits or behaviors. She feels most tired after lunch.   Sleep medical history: HTN, obesity, has lost 43 pounds, insuline resistance.    Family sleep history:  Mother has OSA, father passed in 2008, unexpected, and the next  day her mother had a stroke. Since then her mother lives with her.  Insomnia began at that time.   Social history: single, no children.  Working full time from home for [employer], lives with mother in the patient's  own home. Non smoker, non drinker, caffeine: coffee in AM, one glass / cup.  Remote shift work history.      Review of Systems: Out of a complete 14 system review, the patient complains of only the following symptoms, and all other reviewed systems are negative.chronic insomnia, longstaending OSA , untreated,   Epworth Sleepiness Score: 4/ 24  , Fatigue severity score : 18/ 63   , depression score  not obtained.    Social History   Socioeconomic History  . Marital status: Single    Spouse name: Not on file  . Number of children: 0  . Years of education: Not on file  . Highest education level: Not on file  Occupational History  . Occupation: Sr. Product/process development scientist: [employer]    Comment: work from home  Social Needs  . Financial resource strain: Not on file  . Food insecurity:    Worry: Not on file    Inability: Not on file  . Transportation needs:    Medical: Not on file    Non-medical: Not on file  Tobacco Use  . Smoking status: Never Smoker  . Smokeless tobacco: Never Used  Substance and Sexual Activity  . Alcohol use: No  . Drug use: No  . Sexual activity: Not Currently  Lifestyle  .  Physical activity:    Days per week: Not on file    Minutes per session: Not on file  . Stress: Not on file  Relationships  . Social connections:    Talks on phone: Not on file    Gets together: Not on file    Attends religious service: Not on file    Active member of club or organization: Not on file    Attends meetings of clubs or organizations: Not on file    Relationship status: Not on file  . Intimate partner violence:    Fear of current or ex partner: Not on file    Emotionally abused: Not on file    Physically abused: Not on file    Forced sexual activity: Not on file  Other Topics Concern  . Not on file  Social History Narrative  . Not on file    Family History  Problem Relation Age of Onset  . Cancer Father 71       colon  . High blood pressure Mother   . Stroke Mother   . Anxiety disorder Mother   . Sleep apnea Mother   . Obesity Mother   . Cancer Maternal Aunt 50       breast cancer  . Cancer Paternal Aunt 72       breast cancer  . Cancer Maternal Grandmother 6       ovarian or uterine cancer  . Cancer Paternal Grandmother        unknown primary    Past Medical History:  Diagnosis Date  . Anemia   . Anxiety   . Cancer ([place])    Breast  . Contact lens/glasses fitting    wears contacts or glasses  . Depression   . Fibroids    uterine fibroids  . Hypertension   . Hypothyroidism   . Obesity   . Personal history of chemotherapy   . Personal history of radiation therapy   . Radiation 04/04/14-05/21/14   Right upper-outer breast  . Sleep apnea    has a cpap-does not always use it  . Thyroid disease     Past Surgical History:  Procedure Laterality Date  . BREAST LUMPECTOMY Right 08/03/2013  . BREAST SURGERY     lumpectomy 7/14  . EYE SURGERY  2013   retinal tear-lazer-both  . PORT A CATH REVISION N/A 12/19/2013   Procedure: REPOSITION PORT;  Surgeon: [name], MD;  Location: [facility];  Service: General;  Laterality: N/A;    . PORT-A-CATH REMOVAL Right 08/08/2014   Procedure: REMOVAL  PORT-A-CATH;  Surgeon: [name], MD;  Location: [facility];  Service: General;  Laterality: Right;  . PORTACATH PLACEMENT Right 09/29/2013   Procedure: INSERTION PORT-A-CATH WITH ULTRA SOUND;  Surgeon: [name], MD;  Location: [facility];  Service: General;  Laterality: Right;  . [place]  2012   urerine ablasion  . WISDOM TOOTH EXTRACTION      Current Outpatient Medications  Medication Sig Dispense Refill  . amLODipine (NORVASC) 10 MG tablet Take 1 tablet (10 mg total) by mouth daily. 30 tablet 0  . calcium carbonate (TUMS - DOSED IN MG ELEMENTAL CALCIUM) 500 MG chewable tablet Chew 1 tablet by mouth daily as needed for indigestion or heartburn.    . diphenhydrAMINE (BENADRYL) 25 MG tablet Take 25 mg by mouth every 6 (six) hours as needed.    . tamoxifen (NOLVADEX) 20 MG tablet Take 1 tablet (20 mg total) by mouth daily. 90 tablet 4   No current facility-administered medications for this visit.     Allergies as of 03/08/2018 - Review Complete 03/08/2018  Allergen Reaction Noted  . Gadolinium  02/06/2010  . Tramadol Hives and Nausea Only 07/24/2013    Vitals: BP 132/70   Pulse 66   Ht 5\' 9"  (1.753 m)   Wt 255 lb (115.7 kg)   BMI 37.66 kg/m  Last Weight:  Wt Readings from Last 1 Encounters:  03/08/18 255 lb (115.7 kg)   VHQ:IONG mass index is 37.66 kg/m.     Last Height:   Ht Readings from Last 1 Encounters:  03/08/18 5\' 9"  (1.753 m)    Physical exam:  General: The patient is awake, alert and appears not in acute distress. The patient is well groomed, friendly and cooperative. Head: Normocephalic, atraumatic. Neck is supple. Mallampati 4,  neck circumference:13. 5 " .  Nasal airflow patent ,   Retrognathia is mild,   No history of retainers or braces .  Cardiovascular:  Regular rate and rhythm, without  murmurs or carotid bruit, and without distended neck  veins. Respiratory: Lungs are clear to auscultation. Skin:  Without evidence of edema, or rash Trunk: BMI is 37.66 with clothes on. The patient's posture is erect.   Neurologic exam : The patient is awake and alert, oriented to place and time.   Memory subjective  described as intact.  Attention span & concentration ability appears normal.  Speech is slow - fluent, without dysarthria, dysphonia or aphasia.  Mood and affect are appropriate.  Cranial nerves: Pupils are equal and briskly reactive to light. Extraocular movements  in vertical and horizontal planes intact and without nystagmus. Visual fields by finger perimetry are intact. Hearing to finger rub intact.  Facial sensation intact to fine touch.  Facial motor strength is symmetric and tongue and uvula move midline. Shoulder shrug was symmetrical.   Motor exam: Normal tone, muscle bulk and symmetric strength in all extremities.  Sensory:  Fine touch, pinprick and vibration were tested as normal in all extremities. Proprioception tested in the upper extremities was normal.  Coordination: no changes in penmanship.Rapid alternating movements in the fingers/hands was normal. Finger-to-nose maneuver  normal without evidence of ataxia, dysmetria or tremor.  Gait and station: Patient walks without assistive device and is able unassisted to climb up to the exam table. Strength within normal limits.  Stance is stable and normal.    Deep tendon reflexes: in the  upper and lower extremities are symmetric .  Assessment:  After physical and neurologic examination,  review of laboratory studies,  Personal review of imaging studies, reports of other /same  Imaging studies, results of polysomnography and / or neurophysiology testing and pre-existing records as far as provided in visit., my assessment is:    1) chronic Insomnia due to psychological trauma.  The patient's chronic insomnia can be dated back to the time of her father's death and her  mother's illness, which has meant for the patient to become the caretaker of her mother who now lives with her.  She has learned to listen in if her mother has any kind of medical or other needs at night.  2) the patient has been diagnosed with obstructive sleep apnea however this happened after her father's death, which took place in May 13, 2006.  I have no doubt that her weight which peaked over the last couple of years has contributed to the sleep apnea diagnosis however she has made great strides and is losing weight and a healthy and gradual weight.  It is well possible that there is only mild apnea now present for there was more severe apnea before.  3) the patient has a history of anxiety, but I do wonder if a average sleep time of only 2 or 3 hours at night may be a misperception.  She states she functions father well with little sleep and that makes me think that she may not be aware of how many hours she sleeps.  Given that she is unsure with [place] healthcare I will have to order a home sleep test and not an attended sleep study however I hope to be able to see on a watch pat device how many hours of valid sleep time will be accumulated.  If he finds treated with the degrees of sleep apnea I would certainly ask the patient back to address this, otherwise I would ask her to establish sleep hygiene protocol which means a set time for bed set time to rise, all electronics out of 30 minutes before prep time, prep time includes a hot bath or shower, no electronics in the bedroom, she would be allowed to have a non-light admitting device either listening to classic music etc. or to a meditation tape or audiobooks.  My goal is to stimulate the melatonin production for that the patient enjoys longer sleep phases.   The patient was advised of the nature of the diagnosed disorder , the treatment options and the  risks for general health and wellness arising from not treating the condition.   I spent more than  50 minutes of face to face time with the patient.  Greater than 50% of time was spent in counseling and coordination of care. We have discussed the diagnosis and differential and I answered the patient's questions.    Plan:  Treatment plan and additional workup :  HST to address sleep apnea first, Sleep hygiene rules.  RV after sleep study.     Larey Seat, MD 4/53/6468, 0:32 PM  Certified in Neurology by ABPN Certified in [place] by [facility] [address], [place], [place] [address]

## 2018-03-14 ENCOUNTER — Other Ambulatory Visit (INDEPENDENT_AMBULATORY_CARE_PROVIDER_SITE_OTHER): Payer: Self-pay | Admitting: Family Medicine

## 2018-03-14 DIAGNOSIS — I10 Essential (primary) hypertension: Secondary | ICD-10-CM

## 2018-03-23 ENCOUNTER — Ambulatory Visit (INDEPENDENT_AMBULATORY_CARE_PROVIDER_SITE_OTHER): Payer: 59 | Admitting: Neurology

## 2018-03-23 DIAGNOSIS — D701 Agranulocytosis secondary to cancer chemotherapy: Secondary | ICD-10-CM

## 2018-03-23 DIAGNOSIS — G4709 Other insomnia: Secondary | ICD-10-CM

## 2018-03-23 DIAGNOSIS — E039 Hypothyroidism, unspecified: Secondary | ICD-10-CM

## 2018-03-23 DIAGNOSIS — D0510 Intraductal carcinoma in situ of unspecified breast: Secondary | ICD-10-CM

## 2018-03-23 DIAGNOSIS — T451X5A Adverse effect of antineoplastic and immunosuppressive drugs, initial encounter: Secondary | ICD-10-CM

## 2018-03-23 DIAGNOSIS — G4733 Obstructive sleep apnea (adult) (pediatric): Secondary | ICD-10-CM | POA: Diagnosis not present

## 2018-03-23 DIAGNOSIS — I10 Essential (primary) hypertension: Secondary | ICD-10-CM

## 2018-03-28 ENCOUNTER — Ambulatory Visit (INDEPENDENT_AMBULATORY_CARE_PROVIDER_SITE_OTHER): Payer: 59 | Admitting: Family Medicine

## 2018-03-28 VITALS — BP 134/85 | HR 93 | Temp 98.4°F | Ht 69.0 in | Wt 248.0 lb

## 2018-03-28 DIAGNOSIS — Z9189 Other specified personal risk factors, not elsewhere classified: Secondary | ICD-10-CM | POA: Diagnosis not present

## 2018-03-28 DIAGNOSIS — G4709 Other insomnia: Secondary | ICD-10-CM | POA: Diagnosis not present

## 2018-03-28 DIAGNOSIS — Z6836 Body mass index (BMI) 36.0-36.9, adult: Secondary | ICD-10-CM

## 2018-03-28 DIAGNOSIS — I10 Essential (primary) hypertension: Secondary | ICD-10-CM

## 2018-03-28 MED ORDER — AMLODIPINE BESYLATE 10 MG PO TABS: 10.0000 mg | ORAL_TABLET | Freq: Every day | ORAL | 0 refills | Status: DC

## 2018-03-28 NOTE — Progress Notes (Signed)
Office: [phone]  /  Fax: [phone]   HPI:   Chief Complaint: OBESITY Jennifer Frazier is here to discuss her progress with her obesity treatment plan. She is keeping a food journal with 1100-1300 calories and 80-90 grams of protein or following a vegetarian plan and is following her eating plan approximately 100% of the time. She states she is walking 15-20 minutes 3-5 times per week. Jennifer Frazier reports she has an indulgent day once monthly. She states she is doing well on the plan and is meeting her protein goal daily. Her weight is 248 lb (112.5 kg) today and has had a weight loss of 3 pounds over a period of 3 weeks since her last visit. She has lost 46 lbs since starting treatment with [name].  Hypertension Jennifer Frazier is a 43 y.o. female with hypertension, well controlled on amlodipine 10 mg.  Jennifer Frazier denies chest pain or shortness of breath on exertion. She is working weight loss to help control her blood pressure with the goal of decreasing her risk of heart attack and stroke. Jennifer Frazier's blood pressure is currently controlled.  At risk for cardiovascular disease Jennifer Frazier is at a higher than average risk for cardiovascular disease due to obesity. She currently denies any chest pain.  Insomnia Jennifer Frazier reports seeing Sleep Medicine recently and had a home sleep study last week but does not have the results yet. She reports she is sleeping better.   ASSESSMENT AND PLAN:  Essential hypertension - Plan: amLODipine (NORVASC) 10 MG tablet  Other insomnia  At risk for heart disease  Class 2 severe obesity with serious comorbidity and body mass index (BMI) of 36.0 to 36.9 in adult, unspecified obesity type (Jennifer Frazier)  PLAN:  Hypertension We discussed sodium restriction, working on healthy weight loss, and a regular exercise program as the means to achieve improved blood pressure control. Jennifer Frazier agreed with this plan and agreed to follow up as directed. We will continue to monitor her blood pressure  as well as her progress with the above lifestyle modifications. She was given a refill on her amlodipine 10 mg qd #30 with 0 refills and will watch for signs of hypotension as she continues her lifestyle modifications.  Cardiovascular risk counseling Jennifer Frazier was given extended (15 minutes) coronary artery disease prevention counseling today. She is 43 y.o. female and has risk factors for heart disease including obesity. We discussed intensive lifestyle modifications today with an emphasis on specific weight loss instructions and strategies. Pt was also informed of the importance of increasing exercise and decreasing saturated fats to help prevent heart disease.  Insomnia The problem of recurrent insomnia was discussed. Avoidance of caffeine sources was strongly encouraged and sleep hygiene issues were reviewed. She will follow-up with Sleep Medicine as directed.  Obesity Jennifer Frazier is currently in the action stage of change. As such, her goal is to continue with weight loss efforts. She has agreed to keep a food journal with 1100-1300 calories and 80-90 grams of protein daily or follow a vegetarian plan. Jennifer Frazier has been instructed to continue her exercise regimen as above and consider adding weight training twice weekly. We discussed the following Behavioral Modification Strategies today: better snacking choices and planning for success.  Jennifer Frazier has agreed to followup with our clinic in 4 weeks. She was informed of the importance of frequent follow up visits to maximize her success with intensive lifestyle modifications for her multiple health conditions.  ALLERGIES: Allergies  Allergen Reactions  . Gadolinium  Desc: NAUSEA WITH MRI CONTRAST-NO VOMITING   . Tramadol Hives and Nausea Only    MEDICATIONS: Current Outpatient Medications on File Prior to Visit  Medication Sig Dispense Refill  . calcium carbonate (TUMS - DOSED IN MG ELEMENTAL CALCIUM) 500 MG chewable tablet Chew 1 tablet by mouth  daily as needed for indigestion or heartburn.    . diphenhydrAMINE (BENADRYL) 25 MG tablet Take 25 mg by mouth every 6 (six) hours as needed.    . tamoxifen (NOLVADEX) 20 MG tablet Take 1 tablet (20 mg total) by mouth daily. 90 tablet 4   No current facility-administered medications on file prior to visit.     PAST MEDICAL HISTORY: Past Medical History:  Diagnosis Date  . Anemia   . Anxiety   . Cancer ([place])    Breast  . Contact lens/glasses fitting    wears contacts or glasses  . Depression   . Fibroids    uterine fibroids  . Hypertension   . Hypothyroidism   . Obesity   . Personal history of chemotherapy   . Personal history of radiation therapy   . Radiation 04/04/14-05/21/14   Right upper-outer breast  . Sleep apnea    has a cpap-does not always use it  . Thyroid disease     PAST SURGICAL HISTORY: Past Surgical History:  Procedure Laterality Date  . BREAST LUMPECTOMY Right 08/03/2013  . BREAST SURGERY     lumpectomy 7/14  . EYE SURGERY  2013   retinal tear-lazer-both  . PORT A CATH REVISION N/A 12/19/2013   Procedure: REPOSITION PORT;  Surgeon: [name], MD;  Location: [place];  Service: General;  Laterality: N/A;  . PORT-A-CATH REMOVAL Right 08/08/2014   Procedure: REMOVAL PORT-A-CATH;  Surgeon: [name], MD;  Location: [place];  Service: General;  Laterality: Right;  . PORTACATH PLACEMENT Right 09/29/2013   Procedure: INSERTION PORT-A-CATH WITH ULTRA SOUND;  Surgeon: [name], MD;  Location: [place];  Service: General;  Laterality: Right;  . [place]  2012   urerine ablasion  . WISDOM TOOTH EXTRACTION      SOCIAL HISTORY: Social History   Tobacco Use  . Smoking status: Never Smoker  . Smokeless tobacco: Never Used  Substance Use Topics  . Alcohol use: No  . Drug use: No    FAMILY HISTORY: Family History  Problem Relation Age of Onset  . Cancer Father 11       colon  . High blood  pressure Mother   . Stroke Mother   . Anxiety disorder Mother   . Sleep apnea Mother   . Obesity Mother   . Cancer Maternal Aunt 50       breast cancer  . Cancer Paternal Aunt 23       breast cancer  . Cancer Maternal Grandmother 73       ovarian or uterine cancer  . Cancer Paternal Grandmother        unknown primary   ROS: Review of Systems  Constitutional: Positive for weight loss.  Respiratory: Negative for shortness of breath.   Cardiovascular: Negative for chest pain.  Psychiatric/Behavioral: The patient has insomnia.    PHYSICAL EXAM: Blood pressure 134/85, pulse 93, temperature 98.4 F (36.9 C), temperature source Oral, height 5\' 9"  (1.753 m), weight 248 lb (112.5 kg), SpO2 96 %. Body mass index is 36.62 kg/m. Physical Exam Vitals signs reviewed.  Constitutional:      Appearance: Normal appearance. She is obese.  Cardiovascular:     Rate and Rhythm: Normal rate.     Pulses: Normal pulses.  Pulmonary:     Effort: Pulmonary effort is normal.     Breath sounds: Normal breath sounds.  Musculoskeletal: Normal range of motion.  Skin:    General: Skin is warm and dry.  Neurological:     Mental Status: She is alert and oriented to person, place, and time.  Psychiatric:        Behavior: Behavior normal.   RECENT LABS AND TESTS: BMET    Component Value Date/Time   NA 140 11/04/2017 1424   NA 141 06/24/2017 1236   NA 140 11/04/2016 1500   K 3.7 11/04/2017 1424   K 3.8 11/04/2016 1500   CL 106 11/04/2017 1424   CO2 25 11/04/2017 1424   CO2 27 11/04/2016 1500   GLUCOSE 86 11/04/2017 1424   GLUCOSE 88 11/04/2016 1500   BUN 20 11/04/2017 1424   BUN 15 06/24/2017 1236   BUN 17.6 11/04/2016 1500   CREATININE 0.94 11/04/2017 1424   CREATININE 1.1 11/04/2016 1500   CALCIUM 10.2 11/04/2017 1424   CALCIUM 9.6 11/04/2016 1500   GFRNONAA >60 11/04/2017 1424   GFRAA >60 11/04/2017 1424   Lab Results  Component Value Date   HGBA1C 5.1 12/16/2017   HGBA1C 5.3  06/24/2017   Lab Results  Component Value Date   INSULIN 13.5 12/16/2017   INSULIN 6.2 06/24/2017   CBC    Component Value Date/Time   WBC 5.2 11/04/2017 1424   RBC 4.81 11/04/2017 1424   HGB 13.4 11/04/2017 1424   HGB 13.8 06/24/2017 1236   HGB 13.3 11/04/2016 1500   HCT 41.1 11/04/2017 1424   HCT 43.5 06/24/2017 1236   HCT 40.6 11/04/2016 1500   PLT 193 11/04/2017 1424   PLT 208 11/04/2016 1500   MCV 85.4 11/04/2017 1424   MCV 86 06/24/2017 1236   MCV 84.4 11/04/2016 1500   MCH 27.9 11/04/2017 1424   MCHC 32.6 11/04/2017 1424   RDW 13.0 11/04/2017 1424   RDW 14.8 06/24/2017 1236   RDW 13.9 11/04/2016 1500   LYMPHSABS 2.0 11/04/2017 1424   LYMPHSABS 2.5 06/24/2017 1236   LYMPHSABS 2.0 11/04/2016 1500   MONOABS 0.3 11/04/2017 1424   MONOABS 0.3 11/04/2016 1500   EOSABS 0.1 11/04/2017 1424   EOSABS 0.1 06/24/2017 1236   BASOSABS 0.0 11/04/2017 1424   BASOSABS 0.0 06/24/2017 1236   BASOSABS 0.0 11/04/2016 1500   Iron/TIBC/Ferritin/ %Sat No results found for: IRON, TIBC, FERRITIN, IRONPCTSAT Lipid Panel     Component Value Date/Time   CHOL 128 06/24/2017 1236   TRIG 80 06/24/2017 1236   HDL 57 06/24/2017 1236   LDLCALC 55 06/24/2017 1236   Hepatic Function Panel     Component Value Date/Time   PROT 7.8 11/04/2017 1424   PROT 7.4 06/24/2017 1236   PROT 7.5 11/04/2016 1500   ALBUMIN 3.9 11/04/2017 1424   ALBUMIN 4.4 06/24/2017 1236   ALBUMIN 3.8 11/04/2016 1500   AST 14 (L) 11/04/2017 1424   AST 20 11/04/2016 1500   ALT 10 11/04/2017 1424   ALT 15 11/04/2016 1500   ALKPHOS 67 11/04/2017 1424   ALKPHOS 87 11/04/2016 1500   BILITOT 0.4 11/04/2017 1424   BILITOT 0.3 06/24/2017 1236   BILITOT 0.31 11/04/2016 1500      Component Value Date/Time   TSH 2.030 06/24/2017 1236   TSH 0.129 (L) 08/28/2014 1620    Ref. Range 12/16/2017  08:51  Vitamin D, 25-Hydroxy Latest Ref Range: 30.0 - 100.0 ng/mL 59.8   OBESITY BEHAVIORAL INTERVENTION VISIT  Today's  visit was #15  Starting weight: 294 lbs Starting date: 06/24/2017 Today's weight: 248 lbs  Today's date: 03/28/2018 Total lbs lost to date: 46    03/28/2018  Height 5\' 9"  (1.753 m)  Weight 248 lb (112.5 kg)  BMI (Calculated) 36.61  BLOOD PRESSURE - SYSTOLIC 595  BLOOD PRESSURE - DIASTOLIC 85   Body Fat % 63.8 %  Total Body Water (lbs) 94 lbs   ASK: We discussed the diagnosis of obesity with Jennifer Frazier today and Jennifer Frazier agreed to give [name] permission to discuss obesity behavioral modification therapy today.  ASSESS: Jennifer Frazier has the diagnosis of obesity and her BMI today is 36.61. Jennifer Frazier is in the action stage of change.   ADVISE: Jennifer Frazier was educated on the multiple health risks of obesity as well as the benefit of weight loss to improve her health. She was advised of the need for long term treatment and the importance of lifestyle modifications to improve her current health and to decrease her risk of future health problems.  AGREE: Multiple dietary modification options and treatment options were discussed and  Jennifer Frazier agreed to follow the recommendations documented in the above note.  ARRANGE: Jennifer Frazier was educated on the importance of frequent visits to treat obesity as outlined per CMS and USPSTF guidelines and agreed to schedule her next follow up appointment today.  IMichaelene Song, am acting as Location manager for [employer], FNP-C.  I have reviewed the above documentation for accuracy and completeness, and I agree with the above.  - Kynadee Dam, FNP-C.

## 2018-03-29 ENCOUNTER — Encounter (INDEPENDENT_AMBULATORY_CARE_PROVIDER_SITE_OTHER): Payer: Self-pay | Admitting: Family Medicine

## 2018-04-01 ENCOUNTER — Ambulatory Visit (INDEPENDENT_AMBULATORY_CARE_PROVIDER_SITE_OTHER): Payer: 59 | Admitting: Psychology

## 2018-04-01 DIAGNOSIS — G4733 Obstructive sleep apnea (adult) (pediatric): Secondary | ICD-10-CM | POA: Insufficient documentation

## 2018-04-01 DIAGNOSIS — G4700 Insomnia, unspecified: Secondary | ICD-10-CM

## 2018-04-01 DIAGNOSIS — F4321 Adjustment disorder with depressed mood: Secondary | ICD-10-CM

## 2018-04-01 NOTE — Procedures (Signed)
NAME:  Jennifer Frazier                                                                     DOB: 1975/04/03 MEDICAL RECORD No: [id number]                                                    DOS: 03/23/2018 REFERRING PHYSICIAN: Ilene Qua, MD STUDY PERFORMED: [facility] HISTORY: Jennifer Frazier is a 43 y.o. female patient and seen on 03-08-2018 in a referral from Dr. Adair Patter. The patient is followed by the medical weight management clinic and was dx with insulin resistance and obesity, HTN. Chief complaint according to patient: "I am fatigued, and can't sleep through the night". [name] reports that that she can go to sleep; she usually can't stay asleep for long and estimates her low-average sleep time is only about 3 hours each night.  Paradoxically she feels that she would get the best sleep in the morning hours minutes just before it is time to rise and she has a similar feeling about nap times. She had been diagnosed in the past with obstructive sleep apnea (probably 8 years ago) but could not get used to CPAP.  She is here for a new evaluation.   Epworth Sleepiness Score: endorsed at 4/ 24 points. Fatigue severity score:18/ 63 points , BMI:37.9 kg/m2   STUDY RESULTS:  Total Recording Time:  8 h 12 min. Calculated Sleep Time: 7 h 8 mins. Total Apnea/Hypopnea Index (AHI): 23.1 /h; RDI:  23.5/ h; REM AHI: 49.7/h. Average Oxygen Saturation: 94 %; Lowest Oxygen Saturation: 70 %.  Total Time in Oxygen Saturation below 89 %:10.7 minutes.  Average Heart Rate: 68 bpm (between 53 and 92 bpm). IMPRESSION:  Moderate- severe Obstructive Sleep apnea with strong accentuation in REM sleep, There is mild -moderate snoring indicated by RDI.  No prolonged hypoxemia was found.  RECOMMENDATION: REM accentuated apnea is usually not treated with a dental device, and the first line therapy is CPAP. I will order an autotitration device with a setting from 5-15 cm and 3 cm EPR, and a mask of choice. The mask  can be exchanged within 30 days without cost to the m patient.  I certify that I have reviewed the raw data recording prior to the issuance of this report in accordance with the standards of the American Academy of Sleep Medicine (AASM). Larey Seat, M.D.  04-01-2018     Medical Director of [facility], accredited by the AASM. Diplomat of the ABPN and ABSM.

## 2018-04-01 NOTE — Addendum Note (Signed)
Addended by: Larey Seat on: 04/01/2018 03:02 PM   Modules accepted: Orders

## 2018-04-04 ENCOUNTER — Telehealth: Payer: Self-pay | Admitting: Neurology

## 2018-04-04 NOTE — Telephone Encounter (Signed)
-----   Message from Larey Seat, MD sent at 04/01/2018  3:02 PM EST ----- There is apnea , and it is not mild- this is the most likely cause of fatigue.   IMPRESSION: Moderate- severe Obstructive Sleep apnea with strong  accentuation in REM sleep, There is mild -moderate snoring  indicated by RDI. No prolonged hypoxemia was found.  RECOMMENDATION: REM accentuated apnea is usually not treated with  a dental device, and the first line therapy is CPAP. I will order  an autotitration device with a setting from 5-15 cm and 3 cm EPR,  and a mask of choice. The mask can be exchanged within 30 days without cost to the  patient.   Larey Seat, M.D. 04-01-2018   CC Dr Adair Patter and Dr Hilma Favors.

## 2018-04-04 NOTE — Telephone Encounter (Signed)
Called the patient and advised her of the sleep study. Advised the pt of the findings and the recommendations that are being made with Dr Brett Fairy. Pt had several questions and would like to discuss with Dr Brett Fairy first before agreeing to CPAP therapy. Scheduled the patient for April 2,2020 at 8:30 am with check in of 8 am. Pt verbalized understanding.

## 2018-04-11 ENCOUNTER — Telehealth: Payer: Self-pay | Admitting: Neurology

## 2018-04-11 NOTE — Telephone Encounter (Signed)
Patient was supposed to be seen by Fleeta Emmer on 04-01-2018. Did the visit take place? CD

## 2018-04-19 ENCOUNTER — Other Ambulatory Visit (INDEPENDENT_AMBULATORY_CARE_PROVIDER_SITE_OTHER): Payer: Self-pay | Admitting: Family Medicine

## 2018-04-19 DIAGNOSIS — I10 Essential (primary) hypertension: Secondary | ICD-10-CM

## 2018-04-21 ENCOUNTER — Encounter (INDEPENDENT_AMBULATORY_CARE_PROVIDER_SITE_OTHER): Payer: Self-pay

## 2018-04-21 ENCOUNTER — Ambulatory Visit: Payer: 59 | Admitting: Psychology

## 2018-04-25 ENCOUNTER — Other Ambulatory Visit: Payer: Self-pay

## 2018-04-25 ENCOUNTER — Ambulatory Visit (INDEPENDENT_AMBULATORY_CARE_PROVIDER_SITE_OTHER): Payer: 59 | Admitting: Family Medicine

## 2018-04-25 ENCOUNTER — Encounter (INDEPENDENT_AMBULATORY_CARE_PROVIDER_SITE_OTHER): Payer: Self-pay | Admitting: Family Medicine

## 2018-04-25 DIAGNOSIS — Z6836 Body mass index (BMI) 36.0-36.9, adult: Secondary | ICD-10-CM | POA: Diagnosis not present

## 2018-04-25 DIAGNOSIS — G4733 Obstructive sleep apnea (adult) (pediatric): Secondary | ICD-10-CM

## 2018-04-25 DIAGNOSIS — I10 Essential (primary) hypertension: Secondary | ICD-10-CM | POA: Diagnosis not present

## 2018-04-25 MED ORDER — AMLODIPINE BESYLATE 10 MG PO TABS: 10.0000 mg | ORAL_TABLET | Freq: Every day | ORAL | 0 refills | Status: DC

## 2018-04-26 NOTE — Progress Notes (Signed)
Office: [phone]  /  Fax: [phone] TeleHealth Visit:  DESLYN Frazier has consented to this TeleHealth visit today via telephone call. The patient is located at home, the provider is located at the [facility] office. The participants in this visit include the listed provider and patient.   HPI:   Chief Complaint: OBESITY Jennifer Frazier is here to discuss her progress with her obesity treatment plan. She is on the keep a food journal with 1100-1300 calories and 80-90 grams of protein daily or follow our protein rich vegetarian plan and is following her eating plan approximately 100 % of the time. She states she is walking for 20 minutes 3 times per week. [name] has been feeling stressed by COVID-19. She weighed 244 lbs on 04/20/2018 and 244 lbs today. She is struggling with boredom eating, but is mostly not giving in. She is getting most of protein in. She is mostly journaling by keeping track in her head.  We were unable to weight the patient today for this TeleHealth visit. She feels as if she has maintained her weight since her last visit. She has lost 46 lbs since starting treatment with [name].  Hypertension Jennifer Frazier is a 43 y.o. female with hypertension. HTN has been well controlled recently. Jennifer Frazier has not checked her blood pressure at home recently. She denies chest pain or shortness of breath. She is working on weight loss to help control her blood pressure with the goal of decreasing her risk of heart attack and stroke.  BP Readings from Last 3 Encounters:  03/28/18 134/85  03/08/18 132/70  03/07/18 126/82     Obstructive Sleep Apnea Gyanna is supposed to start CPAP and has a visit scheduled for 08/04/2018 with Neurology. She reports that she is sleeping better.  ASSESSMENT AND PLAN:  Essential hypertension - Plan: amLODipine (NORVASC) 10 MG tablet  Obstructive sleep apnea syndrome  Class 2 severe obesity with serious comorbidity and body mass index (BMI) of 36.0  to 36.9 in adult, unspecified obesity type ([place])  PLAN:  Hypertension We discussed sodium restriction, working on healthy weight loss, and a regular exercise program as the means to achieve improved blood pressure control. Jennifer Frazier agreed with this plan and agreed to follow up as directed. We will continue to monitor her blood pressure as well as her progress with the above lifestyle modifications. Jennifer Frazier agrees to continue taking amlodipine 10 mg qd #30 and we will refill for 1 month. She is to check her blood pressure at home on the day of her next visit. She will watch for signs of hypotension as she continues her lifestyle modifications. Jennifer Frazier agrees to follow up with our clinic in 4 weeks.  Obstructive Sleep Apnea Mettie is to follow up with her Neurologist in July. Jennifer Frazier agrees to follow up with our clinic in 4 weeks.  Obesity Stepahnie is currently in the action stage of change. As such, her goal is to continue with weight loss efforts She has agreed to keep a food journal with 1100-1300 calories and 80-90 grams of protein daily, follow the Pescatarian eating plan or follow our protein rich vegetarian plan Jennifer Frazier has been instructed to walk for 20 minutes 3 times per week. We discussed the following Behavioral Modification Strategies today: increasing lean protein intake, planning for success, and keep a strict food journal   Jennifer Frazier has agreed to follow up with our clinic in 4 weeks. She was informed of the importance of frequent follow up visits to  maximize her success with intensive lifestyle modifications for her multiple health conditions.  ALLERGIES: Allergies  Allergen Reactions  . Gadolinium      Desc: NAUSEA WITH MRI CONTRAST-NO VOMITING   . Tramadol Hives and Nausea Only    MEDICATIONS: Current Outpatient Medications on File Prior to Visit  Medication Sig Dispense Refill  . calcium carbonate (TUMS - DOSED IN MG ELEMENTAL CALCIUM) 500 MG chewable tablet Chew 1 tablet by mouth  daily as needed for indigestion or heartburn.    . diphenhydrAMINE (BENADRYL) 25 MG tablet Take 25 mg by mouth every 6 (six) hours as needed.    . tamoxifen (NOLVADEX) 20 MG tablet Take 1 tablet (20 mg total) by mouth daily. 90 tablet 4   No current facility-administered medications on file prior to visit.     PAST MEDICAL HISTORY: Past Medical History:  Diagnosis Date  . Anemia   . Anxiety   . Cancer ([place])    Breast  . Contact lens/glasses fitting    wears contacts or glasses  . Depression   . Fibroids    uterine fibroids  . Hypertension   . Hypothyroidism   . Obesity   . Personal history of chemotherapy   . Personal history of radiation therapy   . Radiation 04/04/14-05/21/14   Right upper-outer breast  . Sleep apnea    has a cpap-does not always use it  . Thyroid disease     PAST SURGICAL HISTORY: Past Surgical History:  Procedure Laterality Date  . BREAST LUMPECTOMY Right 08/03/2013  . BREAST SURGERY     lumpectomy 7/14  . EYE SURGERY  2013   retinal tear-lazer-both  . PORT A CATH REVISION N/A 12/19/2013   Procedure: REPOSITION PORT;  Surgeon: [name], MD;  Location: [place];  Service: General;  Laterality: N/A;  . PORT-A-CATH REMOVAL Right 08/08/2014   Procedure: REMOVAL PORT-A-CATH;  Surgeon: [name], MD;  Location: [place];  Service: General;  Laterality: Right;  . PORTACATH PLACEMENT Right 09/29/2013   Procedure: INSERTION PORT-A-CATH WITH ULTRA SOUND;  Surgeon: [name], MD;  Location: [place];  Service: General;  Laterality: Right;  . [place]  2012   urerine ablasion  . WISDOM TOOTH EXTRACTION      SOCIAL HISTORY: Social History   Tobacco Use  . Smoking status: Never Smoker  . Smokeless tobacco: Never Used  Substance Use Topics  . Alcohol use: No  . Drug use: No    FAMILY HISTORY: Family History  Problem Relation Age of Onset  . Cancer Father 8       colon  . High blood  pressure Mother   . Stroke Mother   . Anxiety disorder Mother   . Sleep apnea Mother   . Obesity Mother   . Cancer Maternal Aunt 50       breast cancer  . Cancer Paternal Aunt 49       breast cancer  . Cancer Maternal Grandmother 29       ovarian or uterine cancer  . Cancer Paternal Grandmother        unknown primary    ROS: Review of Systems  Constitutional: Negative for weight loss.  Respiratory: Negative for shortness of breath.   Cardiovascular: Negative for chest pain.    PHYSICAL EXAM: Pt in no acute distress  RECENT LABS AND TESTS: BMET    Component Value Date/Time   NA 140 11/04/2017 1424   NA 141 06/24/2017 1236  NA 140 11/04/2016 1500   K 3.7 11/04/2017 1424   K 3.8 11/04/2016 1500   CL 106 11/04/2017 1424   CO2 25 11/04/2017 1424   CO2 27 11/04/2016 1500   GLUCOSE 86 11/04/2017 1424   GLUCOSE 88 11/04/2016 1500   BUN 20 11/04/2017 1424   BUN 15 06/24/2017 1236   BUN 17.6 11/04/2016 1500   CREATININE 0.94 11/04/2017 1424   CREATININE 1.1 11/04/2016 1500   CALCIUM 10.2 11/04/2017 1424   CALCIUM 9.6 11/04/2016 1500   GFRNONAA >60 11/04/2017 1424   GFRAA >60 11/04/2017 1424   Lab Results  Component Value Date   HGBA1C 5.1 12/16/2017   HGBA1C 5.3 06/24/2017   Lab Results  Component Value Date   INSULIN 13.5 12/16/2017   INSULIN 6.2 06/24/2017   CBC    Component Value Date/Time   WBC 5.2 11/04/2017 1424   RBC 4.81 11/04/2017 1424   HGB 13.4 11/04/2017 1424   HGB 13.8 06/24/2017 1236   HGB 13.3 11/04/2016 1500   HCT 41.1 11/04/2017 1424   HCT 43.5 06/24/2017 1236   HCT 40.6 11/04/2016 1500   PLT 193 11/04/2017 1424   PLT 208 11/04/2016 1500   MCV 85.4 11/04/2017 1424   MCV 86 06/24/2017 1236   MCV 84.4 11/04/2016 1500   MCH 27.9 11/04/2017 1424   MCHC 32.6 11/04/2017 1424   RDW 13.0 11/04/2017 1424   RDW 14.8 06/24/2017 1236   RDW 13.9 11/04/2016 1500   LYMPHSABS 2.0 11/04/2017 1424   LYMPHSABS 2.5 06/24/2017 1236   LYMPHSABS 2.0  11/04/2016 1500   MONOABS 0.3 11/04/2017 1424   MONOABS 0.3 11/04/2016 1500   EOSABS 0.1 11/04/2017 1424   EOSABS 0.1 06/24/2017 1236   BASOSABS 0.0 11/04/2017 1424   BASOSABS 0.0 06/24/2017 1236   BASOSABS 0.0 11/04/2016 1500   Iron/TIBC/Ferritin/ %Sat No results found for: IRON, TIBC, FERRITIN, IRONPCTSAT Lipid Panel     Component Value Date/Time   CHOL 128 06/24/2017 1236   TRIG 80 06/24/2017 1236   HDL 57 06/24/2017 1236   LDLCALC 55 06/24/2017 1236   Hepatic Function Panel     Component Value Date/Time   PROT 7.8 11/04/2017 1424   PROT 7.4 06/24/2017 1236   PROT 7.5 11/04/2016 1500   ALBUMIN 3.9 11/04/2017 1424   ALBUMIN 4.4 06/24/2017 1236   ALBUMIN 3.8 11/04/2016 1500   AST 14 (L) 11/04/2017 1424   AST 20 11/04/2016 1500   ALT 10 11/04/2017 1424   ALT 15 11/04/2016 1500   ALKPHOS 67 11/04/2017 1424   ALKPHOS 87 11/04/2016 1500   BILITOT 0.4 11/04/2017 1424   BILITOT 0.3 06/24/2017 1236   BILITOT 0.31 11/04/2016 1500      Component Value Date/Time   TSH 2.030 06/24/2017 1236   TSH 0.129 (L) 08/28/2014 1620      I, Trixie Dredge, am acting as Location manager for [employer], FNP-C.  I have reviewed the above documentation for accuracy and completeness, and I agree with the above.  - Austen Wygant, FNP-C.

## 2018-04-27 ENCOUNTER — Encounter (INDEPENDENT_AMBULATORY_CARE_PROVIDER_SITE_OTHER): Payer: Self-pay | Admitting: Family Medicine

## 2018-04-28 ENCOUNTER — Ambulatory Visit: Payer: Self-pay | Admitting: Neurology

## 2018-04-28 ENCOUNTER — Other Ambulatory Visit (INDEPENDENT_AMBULATORY_CARE_PROVIDER_SITE_OTHER): Payer: Self-pay | Admitting: Family Medicine

## 2018-04-28 DIAGNOSIS — I10 Essential (primary) hypertension: Secondary | ICD-10-CM

## 2018-05-19 ENCOUNTER — Other Ambulatory Visit: Payer: Self-pay | Admitting: Oncology

## 2018-05-23 ENCOUNTER — Other Ambulatory Visit: Payer: Self-pay

## 2018-05-23 ENCOUNTER — Encounter (INDEPENDENT_AMBULATORY_CARE_PROVIDER_SITE_OTHER): Payer: Self-pay | Admitting: Family Medicine

## 2018-05-23 ENCOUNTER — Ambulatory Visit (INDEPENDENT_AMBULATORY_CARE_PROVIDER_SITE_OTHER): Payer: 59 | Admitting: Family Medicine

## 2018-05-23 DIAGNOSIS — E8881 Metabolic syndrome: Secondary | ICD-10-CM | POA: Diagnosis not present

## 2018-05-23 DIAGNOSIS — R350 Frequency of micturition: Secondary | ICD-10-CM

## 2018-05-23 DIAGNOSIS — Z6836 Body mass index (BMI) 36.0-36.9, adult: Secondary | ICD-10-CM | POA: Diagnosis not present

## 2018-05-23 NOTE — Progress Notes (Signed)
Office: [phone]  /  Fax: [phone] TeleHealth Visit:  BOBBIEJO ISHIKAWA has verbally consented to this TeleHealth visit today. The patient is located at home, the provider is located at the [facility] office. The participants in this visit include the listed provider and patient. The visit was conducted today via FaceTime.  HPI:   Chief Complaint: OBESITY Venissa is here to discuss her progress with her obesity treatment plan. She is keeping a food journal with 1100-1300 calories and 80-90 grams of protein daily/Pescatarian or protein rich vegetarian plan and is following her eating plan approximately 100% of the time. She states she is walking 20 minutes 2 times per week. Ireene states she weighed 243 lbs at home today, reflecting a 1 lb weight loss. She is not struggling as much with stress from the pandemic as she was previously. She does report meeting her protein goal most days.  We were unable to weigh the patient today for this TeleHealth visit. She states she weighed 243 lbs at home today. She has lost 46 lbs since starting treatment with [name].  Insulin Resistance Franci has a diagnosis of insulin resistance based on her elevated fasting insulin level >5. Although Sherolyn's blood glucose readings are still under good control, insulin resistance puts her at greater risk of metabolic syndrome and diabetes. She is not taking metformin currently and continues to work on diet and exercise to decrease risk of diabetes. Sheza denies excessive polyphagia, however, reports some polyphagia after dinner. She also reports cravings after dinner. Lab Results  Component Value Date   HGBA1C 5.1 12/16/2017    Urinary Frequency Mishael reports having urinary frequency at night, which disturbs her sleep. She denies dysuria. She is interested in medication for this. We had discussed previously that her awareness of the need to urinate frequently at night was worsened by poor quality of sleep.  She has sleep apnea as evidenced by recent sleep study but has not agreed to CPAP therapy. She has a follow up with Dr. Brett Fairy in July.  ASSESSMENT AND PLAN:  Insulin resistance  Urinary frequency  Class 2 severe obesity with serious comorbidity and body mass index (BMI) of 36.0 to 36.9 in adult, unspecified obesity type ([place])  PLAN:  Insulin Resistance Laquana will continue to work on weight loss, exercise, and decreasing simple carbohydrates in her diet to help decrease the risk of diabetes. We dicussed metformin including benefits and risks. She was informed that eating too many simple carbohydrates or too many calories at one sitting increases the likelihood of GI side effects. Cheney is currently not taking metformin and prescription was not written today. Keilana was advised to increase her protein at dinner. She agrees to follow-up with [name] as directed to monitor her progress.  Urinary Frequency Stavroula was encouraged to set up an appointment with her PCP to discuss.  Obesity Lavone is currently in the action stage of change. As such, her goal is to continue with weight loss efforts. She has agreed to keep a food journal with 1100-1300 calories and 80-90 grams of protein daily. Cicily has been instructed to continue her current exercise regimen for weight loss and overall health benefits. We discussed the following Behavioral Modification Strategies today: increasing lean protein intake, work on meal planning and easy cooking plans, planning for success, and keep a strict food journal.  Juelz has agreed to follow-up with our clinic in 2 weeks. She was informed of the importance of frequent follow-up visits to maximize  her success with intensive lifestyle modifications for her multiple health conditions.  ALLERGIES: Allergies  Allergen Reactions   Gadolinium      Desc: NAUSEA WITH MRI CONTRAST-NO VOMITING    Tramadol Hives and Nausea Only    MEDICATIONS: Current Outpatient  Medications on File Prior to Visit  Medication Sig Dispense Refill   amLODipine (NORVASC) 10 MG tablet Take 1 tablet (10 mg total) by mouth daily. 30 tablet 0   calcium carbonate (TUMS - DOSED IN MG ELEMENTAL CALCIUM) 500 MG chewable tablet Chew 1 tablet by mouth daily as needed for indigestion or heartburn.     diphenhydrAMINE (BENADRYL) 25 MG tablet Take 25 mg by mouth every 6 (six) hours as needed.     tamoxifen (NOLVADEX) 20 MG tablet TAKE 1 TABLET BY MOUTH ONCE DAILY 30 tablet 3   No current facility-administered medications on file prior to visit.     PAST MEDICAL HISTORY: Past Medical History:  Diagnosis Date   Anemia    Anxiety    Cancer ([place])    Breast   Contact lens/glasses fitting    wears contacts or glasses   Depression    Fibroids    uterine fibroids   Hypertension    Hypothyroidism    Obesity    Personal history of chemotherapy    Personal history of radiation therapy    Radiation 04/04/14-05/21/14   Right upper-outer breast   Sleep apnea    has a cpap-does not always use it   Thyroid disease     PAST SURGICAL HISTORY: Past Surgical History:  Procedure Laterality Date   BREAST LUMPECTOMY Right 08/03/2013   BREAST SURGERY     lumpectomy 7/14   EYE SURGERY  2013   retinal tear-lazer-both   PORT A CATH REVISION N/A 12/19/2013   Procedure: REPOSITION PORT;  Surgeon: [name], MD;  Location: [place];  Service: General;  Laterality: N/A;   PORT-A-CATH REMOVAL Right 08/08/2014   Procedure: REMOVAL PORT-A-CATH;  Surgeon: [name], MD;  Location: [place];  Service: General;  Laterality: Right;   PORTACATH PLACEMENT Right 09/29/2013   Procedure: INSERTION PORT-A-CATH WITH ULTRA SOUND;  Surgeon: [name], MD;  Location: [place];  Service: General;  Laterality: Right;   [place]  2012   urerine ablasion   WISDOM TOOTH EXTRACTION      SOCIAL HISTORY: Social History    Tobacco Use   Smoking status: Never Smoker   Smokeless tobacco: Never Used  Substance Use Topics   Alcohol use: No   Drug use: No    FAMILY HISTORY: Family History  Problem Relation Age of Onset   Cancer Father 23       colon   High blood pressure Mother    Stroke Mother    Anxiety disorder Mother    Sleep apnea Mother    Obesity Mother    Cancer Maternal Aunt 56       breast cancer   Cancer Paternal Aunt 89       breast cancer   Cancer Maternal Grandmother 38       ovarian or uterine cancer   Cancer Paternal Grandmother        unknown primary   ROS: Review of Systems  Genitourinary:       Positive for urinary frequency.  Endo/Heme/Allergies:       Positive for some polyphagia after dinner.   PHYSICAL EXAM: Pt in no acute distress  RECENT LABS AND  TESTS: BMET    Component Value Date/Time   NA 140 11/04/2017 1424   NA 141 06/24/2017 1236   NA 140 11/04/2016 1500   K 3.7 11/04/2017 1424   K 3.8 11/04/2016 1500   CL 106 11/04/2017 1424   CO2 25 11/04/2017 1424   CO2 27 11/04/2016 1500   GLUCOSE 86 11/04/2017 1424   GLUCOSE 88 11/04/2016 1500   BUN 20 11/04/2017 1424   BUN 15 06/24/2017 1236   BUN 17.6 11/04/2016 1500   CREATININE 0.94 11/04/2017 1424   CREATININE 1.1 11/04/2016 1500   CALCIUM 10.2 11/04/2017 1424   CALCIUM 9.6 11/04/2016 1500   GFRNONAA >60 11/04/2017 1424   GFRAA >60 11/04/2017 1424   Lab Results  Component Value Date   HGBA1C 5.1 12/16/2017   HGBA1C 5.3 06/24/2017   Lab Results  Component Value Date   INSULIN 13.5 12/16/2017   INSULIN 6.2 06/24/2017   CBC    Component Value Date/Time   WBC 5.2 11/04/2017 1424   RBC 4.81 11/04/2017 1424   HGB 13.4 11/04/2017 1424   HGB 13.8 06/24/2017 1236   HGB 13.3 11/04/2016 1500   HCT 41.1 11/04/2017 1424   HCT 43.5 06/24/2017 1236   HCT 40.6 11/04/2016 1500   PLT 193 11/04/2017 1424   PLT 208 11/04/2016 1500   MCV 85.4 11/04/2017 1424   MCV 86 06/24/2017 1236    MCV 84.4 11/04/2016 1500   MCH 27.9 11/04/2017 1424   MCHC 32.6 11/04/2017 1424   RDW 13.0 11/04/2017 1424   RDW 14.8 06/24/2017 1236   RDW 13.9 11/04/2016 1500   LYMPHSABS 2.0 11/04/2017 1424   LYMPHSABS 2.5 06/24/2017 1236   LYMPHSABS 2.0 11/04/2016 1500   MONOABS 0.3 11/04/2017 1424   MONOABS 0.3 11/04/2016 1500   EOSABS 0.1 11/04/2017 1424   EOSABS 0.1 06/24/2017 1236   BASOSABS 0.0 11/04/2017 1424   BASOSABS 0.0 06/24/2017 1236   BASOSABS 0.0 11/04/2016 1500   Iron/TIBC/Ferritin/ %Sat No results found for: IRON, TIBC, FERRITIN, IRONPCTSAT Lipid Panel     Component Value Date/Time   CHOL 128 06/24/2017 1236   TRIG 80 06/24/2017 1236   HDL 57 06/24/2017 1236   LDLCALC 55 06/24/2017 1236   Hepatic Function Panel     Component Value Date/Time   PROT 7.8 11/04/2017 1424   PROT 7.4 06/24/2017 1236   PROT 7.5 11/04/2016 1500   ALBUMIN 3.9 11/04/2017 1424   ALBUMIN 4.4 06/24/2017 1236   ALBUMIN 3.8 11/04/2016 1500   AST 14 (L) 11/04/2017 1424   AST 20 11/04/2016 1500   ALT 10 11/04/2017 1424   ALT 15 11/04/2016 1500   ALKPHOS 67 11/04/2017 1424   ALKPHOS 87 11/04/2016 1500   BILITOT 0.4 11/04/2017 1424   BILITOT 0.3 06/24/2017 1236   BILITOT 0.31 11/04/2016 1500      Component Value Date/Time   TSH 2.030 06/24/2017 1236   TSH 0.129 (L) 08/28/2014 1620   Results for JISSELL, TRAFTON (MRN [id number]) as of 05/23/2018 12:41  Ref. Range 12/16/2017 08:51  Vitamin D, 25-Hydroxy Latest Ref Range: 30.0 - 100.0 ng/mL 59.8   I, Michaelene Song, am acting as Location manager for [employer], FNP-C.  I have reviewed the above documentation for accuracy and completeness, and I agree with the above.  - Khylen Riolo, FNP-C.

## 2018-06-06 ENCOUNTER — Encounter (INDEPENDENT_AMBULATORY_CARE_PROVIDER_SITE_OTHER): Payer: Self-pay | Admitting: Family Medicine

## 2018-06-06 ENCOUNTER — Other Ambulatory Visit: Payer: Self-pay

## 2018-06-06 ENCOUNTER — Ambulatory Visit (INDEPENDENT_AMBULATORY_CARE_PROVIDER_SITE_OTHER): Payer: 59 | Admitting: Family Medicine

## 2018-06-06 DIAGNOSIS — I10 Essential (primary) hypertension: Secondary | ICD-10-CM | POA: Diagnosis not present

## 2018-06-06 DIAGNOSIS — Z6836 Body mass index (BMI) 36.0-36.9, adult: Secondary | ICD-10-CM | POA: Diagnosis not present

## 2018-06-06 DIAGNOSIS — E8881 Metabolic syndrome: Secondary | ICD-10-CM | POA: Diagnosis not present

## 2018-06-06 MED ORDER — AMLODIPINE BESYLATE 10 MG PO TABS: 10.0000 mg | ORAL_TABLET | Freq: Every day | ORAL | 0 refills | Status: DC

## 2018-06-06 NOTE — Progress Notes (Signed)
Office: [phone]  /  Fax: [phone] TeleHealth Visit:  NAOMIA Frazier has verbally consented to this TeleHealth visit today. The patient is located at home, the provider is located at the [employer] [facility] office. The participants in this visit include the listed provider and patient. The visit was conducted today via Webex.  HPI:   Chief Complaint: OBESITY Jennifer Frazier is here to discuss her progress with her obesity treatment plan. She is keeping a food journal with 1100-1300 calories and 80-90 grams of protein daily and is following her eating plan approximately 100% of the time. She states she is walking 20 minutes 3 times per week. Jennifer Frazier states she weighed 240 lbs today, reflecting a 3 lb weight loss. She does have some cravings a few hours after meals. She reports meeting her protein goal most days but notices she is hungrier when she has not eaten enough protein. . She reports adding protein powder to drinks. We were unable to weigh the patient today for this TeleHealth visit. She states she weighed 240 lbs today (down 3 lbs). She has lost 46 lbs since starting treatment with [name].  Hypertension Jennifer Frazier is a 43 y.o. female with hypertension which is currently well controlled.  She denies chest pain or shortness of breath on exertion. She is working weight loss to help control her blood pressure with the goal of decreasing her risk of heart attack and stroke. [name]'s blood pressure at home is 134/81.  Insulin Resistance Jennifer Frazier has a diagnosis of insulin resistance based on her elevated fasting insulin level >5. Although Jennifer Frazier's blood glucose readings are still under good control, insulin resistance puts her at greater risk of metabolic syndrome and diabetes. She declines metformin for now and continues to work on diet and exercise to decrease risk of diabetes. She reports polyphagia at night and notices increased cravings when she has a protein deficit.  ASSESSMENT AND  PLAN:  Essential hypertension - Plan: amLODipine (NORVASC) 10 MG tablet  Insulin resistance  Class 2 severe obesity with serious comorbidity and body mass index (BMI) of 36.0 to 36.9 in adult, unspecified obesity type ([place])  PLAN:  Hypertension We discussed sodium restriction, working on healthy weight loss, and a regular exercise program as the means to achieve improved blood pressure control. Jennifer Frazier agreed with this plan and agreed to follow up as directed. We will continue to monitor her blood pressure as well as her progress with the above lifestyle modifications. Jennifer Frazier was given a refill on her Norvasc 10 mg QD #30 with 0 refills and agrees to follow-up with our clinic in 2 weeks. She will watch for signs of hypotension as she continues her lifestyle modifications.  Insulin Resistance Jennifer Frazier will continue to work on weight loss, exercise, and decreasing simple carbohydrates in her diet to help decrease the risk of diabetes. We dicussed metformin including benefits and risks. She was informed that eating too many simple carbohydrates or too many calories at one sitting increases the likelihood of GI side effects. Jennifer Frazier will continue her meal plan and increase her protein intake. Jennifer Frazier agreed to follow-up with [name] as directed to monitor her progress.  Obesity Jennifer Frazier is currently in the action stage of change. As such, her goal is to continue with weight loss efforts. She has agreed to keep a food journal with 1100-1300 calories and 80-90 grams of protein daily. Jennifer Frazier has been instructed to continue her current exercise regimen for weight loss and overall health benefits. We discussed  the following Behavioral Modification Strategies today: increasing lean protein intake, increase H20 intake, better snacking choices, and planning for success.  Jennifer Frazier has agreed to follow-up with our clinic in 2 weeks. She was informed of the importance of frequent follow-up visits to maximize her success with  intensive lifestyle modifications for her multiple health conditions.  ALLERGIES: Allergies  Allergen Reactions   Gadolinium      Desc: NAUSEA WITH MRI CONTRAST-NO VOMITING    Tramadol Hives and Nausea Only    MEDICATIONS: Current Outpatient Medications on File Prior to Visit  Medication Sig Dispense Refill   calcium carbonate (TUMS - DOSED IN MG ELEMENTAL CALCIUM) 500 MG chewable tablet Chew 1 tablet by mouth daily as needed for indigestion or heartburn.     diphenhydrAMINE (BENADRYL) 25 MG tablet Take 25 mg by mouth every 6 (six) hours as needed.     tamoxifen (NOLVADEX) 20 MG tablet TAKE 1 TABLET BY MOUTH ONCE DAILY 30 tablet 3   No current facility-administered medications on file prior to visit.     PAST MEDICAL HISTORY: Past Medical History:  Diagnosis Date   Anemia    Anxiety    Cancer ([place])    Breast   Contact lens/glasses fitting    wears contacts or glasses   Depression    Fibroids    uterine fibroids   Hypertension    Hypothyroidism    Obesity    Personal history of chemotherapy    Personal history of radiation therapy    Radiation 04/04/14-05/21/14   Right upper-outer breast   Sleep apnea    has a cpap-does not always use it   Thyroid disease     PAST SURGICAL HISTORY: Past Surgical History:  Procedure Laterality Date   BREAST LUMPECTOMY Right 08/03/2013   BREAST SURGERY     lumpectomy 7/14   EYE SURGERY  2013   retinal tear-lazer-both   PORT A CATH REVISION N/A 12/19/2013   Procedure: REPOSITION PORT;  Surgeon: [name], MD;  Location: [place];  Service: General;  Laterality: N/A;   PORT-A-CATH REMOVAL Right 08/08/2014   Procedure: REMOVAL PORT-A-CATH;  Surgeon: [name], MD;  Location: [place];  Service: General;  Laterality: Right;   PORTACATH PLACEMENT Right 09/29/2013   Procedure: INSERTION PORT-A-CATH WITH ULTRA SOUND;  Surgeon: [name], MD;  Location: [place];  Service: General;  Laterality: Right;   [place]  2012   urerine ablasion   WISDOM TOOTH EXTRACTION      SOCIAL HISTORY: Social History   Tobacco Use   Smoking status: Never Smoker   Smokeless tobacco: Never Used  Substance Use Topics   Alcohol use: No   Drug use: No    FAMILY HISTORY: Family History  Problem Relation Age of Onset   Cancer Father 37       colon   High blood pressure Mother    Stroke Mother    Anxiety disorder Mother    Sleep apnea Mother    Obesity Mother    Cancer Maternal Aunt 53       breast cancer   Cancer Paternal Aunt 26       breast cancer   Cancer Maternal Grandmother 82       ovarian or uterine cancer   Cancer Paternal Grandmother        unknown primary   ROS: Review of Systems  Respiratory: Negative for shortness of breath.   Cardiovascular: Negative for chest pain.  Endo/Heme/Allergies:       Positive for polyphagia at night.   PHYSICAL EXAM: Pt in no acute distress  RECENT LABS AND TESTS: BMET    Component Value Date/Time   NA 140 11/04/2017 1424   NA 141 06/24/2017 1236   NA 140 11/04/2016 1500   K 3.7 11/04/2017 1424   K 3.8 11/04/2016 1500   CL 106 11/04/2017 1424   CO2 25 11/04/2017 1424   CO2 27 11/04/2016 1500   GLUCOSE 86 11/04/2017 1424   GLUCOSE 88 11/04/2016 1500   BUN 20 11/04/2017 1424   BUN 15 06/24/2017 1236   BUN 17.6 11/04/2016 1500   CREATININE 0.94 11/04/2017 1424   CREATININE 1.1 11/04/2016 1500   CALCIUM 10.2 11/04/2017 1424   CALCIUM 9.6 11/04/2016 1500   GFRNONAA >60 11/04/2017 1424   GFRAA >60 11/04/2017 1424   Lab Results  Component Value Date   HGBA1C 5.1 12/16/2017   HGBA1C 5.3 06/24/2017   Lab Results  Component Value Date   INSULIN 13.5 12/16/2017   INSULIN 6.2 06/24/2017   CBC    Component Value Date/Time   WBC 5.2 11/04/2017 1424   RBC 4.81 11/04/2017 1424   HGB 13.4 11/04/2017 1424   HGB 13.8 06/24/2017 1236   HGB 13.3 11/04/2016 1500   HCT  41.1 11/04/2017 1424   HCT 43.5 06/24/2017 1236   HCT 40.6 11/04/2016 1500   PLT 193 11/04/2017 1424   PLT 208 11/04/2016 1500   MCV 85.4 11/04/2017 1424   MCV 86 06/24/2017 1236   MCV 84.4 11/04/2016 1500   MCH 27.9 11/04/2017 1424   MCHC 32.6 11/04/2017 1424   RDW 13.0 11/04/2017 1424   RDW 14.8 06/24/2017 1236   RDW 13.9 11/04/2016 1500   LYMPHSABS 2.0 11/04/2017 1424   LYMPHSABS 2.5 06/24/2017 1236   LYMPHSABS 2.0 11/04/2016 1500   MONOABS 0.3 11/04/2017 1424   MONOABS 0.3 11/04/2016 1500   EOSABS 0.1 11/04/2017 1424   EOSABS 0.1 06/24/2017 1236   BASOSABS 0.0 11/04/2017 1424   BASOSABS 0.0 06/24/2017 1236   BASOSABS 0.0 11/04/2016 1500   Iron/TIBC/Ferritin/ %Sat No results found for: IRON, TIBC, FERRITIN, IRONPCTSAT Lipid Panel     Component Value Date/Time   CHOL 128 06/24/2017 1236   TRIG 80 06/24/2017 1236   HDL 57 06/24/2017 1236   LDLCALC 55 06/24/2017 1236   Hepatic Function Panel     Component Value Date/Time   PROT 7.8 11/04/2017 1424   PROT 7.4 06/24/2017 1236   PROT 7.5 11/04/2016 1500   ALBUMIN 3.9 11/04/2017 1424   ALBUMIN 4.4 06/24/2017 1236   ALBUMIN 3.8 11/04/2016 1500   AST 14 (L) 11/04/2017 1424   AST 20 11/04/2016 1500   ALT 10 11/04/2017 1424   ALT 15 11/04/2016 1500   ALKPHOS 67 11/04/2017 1424   ALKPHOS 87 11/04/2016 1500   BILITOT 0.4 11/04/2017 1424   BILITOT 0.3 06/24/2017 1236   BILITOT 0.31 11/04/2016 1500      Component Value Date/Time   TSH 2.030 06/24/2017 1236   TSH 0.129 (L) 08/28/2014 1620   Results for TAKESHIA, WENK (MRN [id number]) as of 06/06/2018 14:42  Ref. Range 12/16/2017 08:51  Vitamin D, 25-Hydroxy Latest Ref Range: 30.0 - 100.0 ng/mL 59.8   I, Michaelene Song, am acting as Location manager for [employer], FNP-C.  I have reviewed the above documentation for accuracy and completeness, and I agree with the above.  - Endy Easterly, FNP-C.

## 2018-06-21 ENCOUNTER — Ambulatory Visit (INDEPENDENT_AMBULATORY_CARE_PROVIDER_SITE_OTHER): Payer: Self-pay | Admitting: Family Medicine

## 2018-06-22 ENCOUNTER — Encounter (INDEPENDENT_AMBULATORY_CARE_PROVIDER_SITE_OTHER): Payer: Self-pay | Admitting: Family Medicine

## 2018-06-22 ENCOUNTER — Other Ambulatory Visit: Payer: Self-pay

## 2018-06-22 ENCOUNTER — Ambulatory Visit (INDEPENDENT_AMBULATORY_CARE_PROVIDER_SITE_OTHER): Payer: 59 | Admitting: Family Medicine

## 2018-06-22 DIAGNOSIS — Z6836 Body mass index (BMI) 36.0-36.9, adult: Secondary | ICD-10-CM | POA: Diagnosis not present

## 2018-06-22 DIAGNOSIS — I10 Essential (primary) hypertension: Secondary | ICD-10-CM | POA: Diagnosis not present

## 2018-06-22 NOTE — Progress Notes (Signed)
Office: [phone]  /  Fax: [phone] TeleHealth Visit:  Jennifer Frazier has verbally consented to this TeleHealth visit today. The patient is located at home, the provider is located at the [facility] office. The participants in this visit include the listed provider and patient. The visit was conducted today via Webex.  HPI:   Chief Complaint: OBESITY Jennifer Frazier is here to discuss her progress with her obesity treatment plan. She is keeping a food journal with [phone] calories and 80-90 grams of protein daily and is following her eating plan approximately 100% of the time. She states sheis walking 20 minutes 3 times per week. Jennifer Frazier states she weighs 238 lbs today and has lost 2 lbs since her last office visit. She is complying with the plan 100% and doing very well. She has added more vegetables for snacking. We were unable to weigh the patient today for this TeleHealth visit. She reports her weight today to be 238 lbs. She has lost 46 lbs since starting treatment with [name].  Hypertension Jennifer Frazier is a 43 y.o. female with hypertension, well controlled on Norvasc.  Jennifer Frazier denies chest pain or shortness of breath on exertion. She is working weight loss to help control her blood pressure with the goal of decreasing her risk of heart attack and stroke. Jennifer Frazier's blood pressure at home today was 117/76. BP Readings from Last 3 Encounters:  03/28/18 134/85  03/08/18 132/70  03/07/18 126/82    ASSESSMENT AND PLAN:  Essential hypertension  Class 2 severe obesity with serious comorbidity and body mass index (BMI) of 36.0 to 36.9 in adult, unspecified obesity type (Jennifer Frazier)  PLAN:  Hypertension We discussed sodium restriction, working on healthy weight loss, and a regular exercise program as the means to achieve improved blood pressure control. Jennifer Frazier agreed with this plan and agreed to follow up as directed. We will continue to monitor her blood pressure as well as her  progress with the above lifestyle modifications. Rees will continue Norvasc as prescribed and will watch for signs of hypotension as she continues her lifestyle modifications.  I spent > than 50% of the 15 minute visit on counseling as documented in the note.  Obesity Jennifer Frazier is currently in the action stage of change. As such, her goal is to continue with weight loss efforts. She has agreed to keep a food journal with [phone] calories and 90 grams of protein daily. Jennifer Frazier has been instructed to continue her current exercise regimen and add resistance training a few days a week for weight loss and overall health benefits. We discussed the following Behavioral Modification Strategies today: planning for success.  Jennifer Frazier has agreed to follow-up with our clinic in 2 weeks. She was informed of the importance of frequent follow-up visits to maximize her success with intensive lifestyle modifications for her multiple health conditions.  ALLERGIES: Allergies  Allergen Reactions  . Gadolinium      Desc: NAUSEA WITH MRI CONTRAST-NO VOMITING   . Tramadol Hives and Nausea Only    MEDICATIONS: Current Outpatient Medications on File Prior to Visit  Medication Sig Dispense Refill  . amLODipine (NORVASC) 10 MG tablet Take 1 tablet (10 mg total) by mouth daily. 30 tablet 0  . calcium carbonate (TUMS - DOSED IN MG ELEMENTAL CALCIUM) 500 MG chewable tablet Chew 1 tablet by mouth daily as needed for indigestion or heartburn.    . diphenhydrAMINE (BENADRYL) 25 MG tablet Take 25 mg by mouth every 6 (six) hours  as needed.    . tamoxifen (NOLVADEX) 20 MG tablet TAKE 1 TABLET BY MOUTH ONCE DAILY 30 tablet 3   No current facility-administered medications on file prior to visit.     PAST MEDICAL HISTORY: Past Medical History:  Diagnosis Date  . Anemia   . Anxiety   . Cancer ([place])    Breast  . Contact lens/glasses fitting    wears contacts or glasses  . Depression   . Fibroids    uterine fibroids  .  Hypertension   . Hypothyroidism   . Obesity   . Personal history of chemotherapy   . Personal history of radiation therapy   . Radiation 04/04/14-05/21/14   Right upper-outer breast  . Sleep apnea    has a cpap-does not always use it  . Thyroid disease     PAST SURGICAL HISTORY: Past Surgical History:  Procedure Laterality Date  . BREAST LUMPECTOMY Right 08/03/2013  . BREAST SURGERY     lumpectomy 7/14  . EYE SURGERY  2013   retinal tear-lazer-both  . PORT A CATH REVISION N/A 12/19/2013   Procedure: REPOSITION PORT;  Surgeon: [name], MD;  Location: [facility];  Service: General;  Laterality: N/A;  . PORT-A-CATH REMOVAL Right 08/08/2014   Procedure: REMOVAL PORT-A-CATH;  Surgeon: [name], MD;  Location: [place];  Service: General;  Laterality: Right;  . PORTACATH PLACEMENT Right 09/29/2013   Procedure: INSERTION PORT-A-CATH WITH ULTRA SOUND;  Surgeon: [name], MD;  Location: [place];  Service: General;  Laterality: Right;  . [place]  2012   urerine ablasion  . WISDOM TOOTH EXTRACTION      SOCIAL HISTORY: Social History   Tobacco Use  . Smoking status: Never Smoker  . Smokeless tobacco: Never Used  Substance Use Topics  . Alcohol use: No  . Drug use: No    FAMILY HISTORY: Family History  Problem Relation Age of Onset  . Cancer Father 24       colon  . High blood pressure Mother   . Stroke Mother   . Anxiety disorder Mother   . Sleep apnea Mother   . Obesity Mother   . Cancer Maternal Aunt 50       breast cancer  . Cancer Paternal Aunt 27       breast cancer  . Cancer Maternal Grandmother 69       ovarian or uterine cancer  . Cancer Paternal Grandmother        unknown primary   ROS: Review of Systems  Respiratory: Negative for shortness of breath.   Cardiovascular: Negative for chest pain.   PHYSICAL EXAM: Pt in no acute distress  RECENT LABS AND TESTS: BMET    Component Value Date/Time    NA 140 11/04/2017 1424   NA 141 06/24/2017 1236   NA 140 11/04/2016 1500   K 3.7 11/04/2017 1424   K 3.8 11/04/2016 1500   CL 106 11/04/2017 1424   CO2 25 11/04/2017 1424   CO2 27 11/04/2016 1500   GLUCOSE 86 11/04/2017 1424   GLUCOSE 88 11/04/2016 1500   BUN 20 11/04/2017 1424   BUN 15 06/24/2017 1236   BUN 17.6 11/04/2016 1500   CREATININE 0.94 11/04/2017 1424   CREATININE 1.1 11/04/2016 1500   CALCIUM 10.2 11/04/2017 1424   CALCIUM 9.6 11/04/2016 1500   GFRNONAA >60 11/04/2017 1424   GFRAA >60 11/04/2017 1424   Lab Results  Component Value Date  HGBA1C 5.1 12/16/2017   HGBA1C 5.3 06/24/2017   Lab Results  Component Value Date   INSULIN 13.5 12/16/2017   INSULIN 6.2 06/24/2017   CBC    Component Value Date/Time   WBC 5.2 11/04/2017 1424   RBC 4.81 11/04/2017 1424   HGB 13.4 11/04/2017 1424   HGB 13.8 06/24/2017 1236   HGB 13.3 11/04/2016 1500   HCT 41.1 11/04/2017 1424   HCT 43.5 06/24/2017 1236   HCT 40.6 11/04/2016 1500   PLT 193 11/04/2017 1424   PLT 208 11/04/2016 1500   MCV 85.4 11/04/2017 1424   MCV 86 06/24/2017 1236   MCV 84.4 11/04/2016 1500   MCH 27.9 11/04/2017 1424   MCHC 32.6 11/04/2017 1424   RDW 13.0 11/04/2017 1424   RDW 14.8 06/24/2017 1236   RDW 13.9 11/04/2016 1500   LYMPHSABS 2.0 11/04/2017 1424   LYMPHSABS 2.5 06/24/2017 1236   LYMPHSABS 2.0 11/04/2016 1500   MONOABS 0.3 11/04/2017 1424   MONOABS 0.3 11/04/2016 1500   EOSABS 0.1 11/04/2017 1424   EOSABS 0.1 06/24/2017 1236   BASOSABS 0.0 11/04/2017 1424   BASOSABS 0.0 06/24/2017 1236   BASOSABS 0.0 11/04/2016 1500   Iron/TIBC/Ferritin/ %Sat No results found for: IRON, TIBC, FERRITIN, IRONPCTSAT Lipid Panel     Component Value Date/Time   CHOL 128 06/24/2017 1236   TRIG 80 06/24/2017 1236   HDL 57 06/24/2017 1236   LDLCALC 55 06/24/2017 1236   Hepatic Function Panel     Component Value Date/Time   PROT 7.8 11/04/2017 1424   PROT 7.4 06/24/2017 1236   PROT 7.5  11/04/2016 1500   ALBUMIN 3.9 11/04/2017 1424   ALBUMIN 4.4 06/24/2017 1236   ALBUMIN 3.8 11/04/2016 1500   AST 14 (L) 11/04/2017 1424   AST 20 11/04/2016 1500   ALT 10 11/04/2017 1424   ALT 15 11/04/2016 1500   ALKPHOS 67 11/04/2017 1424   ALKPHOS 87 11/04/2016 1500   BILITOT 0.4 11/04/2017 1424   BILITOT 0.3 06/24/2017 1236   BILITOT 0.31 11/04/2016 1500      Component Value Date/Time   TSH 2.030 06/24/2017 1236   TSH 0.129 (L) 08/28/2014 1620   Results for OMNIA, DOLLINGER (MRN [id number]) as of 06/22/2018 14:48  Ref. Range 12/16/2017 08:51  Vitamin D, 25-Hydroxy Latest Ref Range: 30.0 - 100.0 ng/mL 59.8    I, Michaelene Song, am acting as Location manager for [employer], FNP-C.  I have reviewed the above documentation for accuracy and completeness, and I agree with the above.  - Mitul Hallowell, FNP-C.

## 2018-06-23 ENCOUNTER — Encounter (INDEPENDENT_AMBULATORY_CARE_PROVIDER_SITE_OTHER): Payer: Self-pay | Admitting: Family Medicine

## 2018-07-01 ENCOUNTER — Ambulatory Visit: Payer: 59 | Admitting: Psychology

## 2018-07-05 ENCOUNTER — Encounter (INDEPENDENT_AMBULATORY_CARE_PROVIDER_SITE_OTHER): Payer: Self-pay | Admitting: Family Medicine

## 2018-07-05 ENCOUNTER — Other Ambulatory Visit: Payer: Self-pay

## 2018-07-05 ENCOUNTER — Ambulatory Visit (INDEPENDENT_AMBULATORY_CARE_PROVIDER_SITE_OTHER): Payer: 59 | Admitting: Family Medicine

## 2018-07-05 DIAGNOSIS — Z6836 Body mass index (BMI) 36.0-36.9, adult: Secondary | ICD-10-CM

## 2018-07-05 DIAGNOSIS — I10 Essential (primary) hypertension: Secondary | ICD-10-CM | POA: Diagnosis not present

## 2018-07-05 DIAGNOSIS — E8881 Metabolic syndrome: Secondary | ICD-10-CM

## 2018-07-05 MED ORDER — AMLODIPINE BESYLATE 10 MG PO TABS: 10.0000 mg | ORAL_TABLET | Freq: Every day | ORAL | 0 refills | Status: DC

## 2018-07-05 NOTE — Progress Notes (Signed)
Office: [phone]  /  Fax: [phone] TeleHealth Visit:  Jennifer Frazier has verbally consented to this TeleHealth visit today. The patient is located at home, the provider is located at the [facility] office. The participants in this visit include the listed provider and patient. The visit was conducted today via webex.  HPI:   Chief Complaint: OBESITY Jennifer Frazier is here to discuss her progress with her obesity treatment plan. She is on the keep a food journal with 1100-1300 calories and 90 grams of protein daily and is following her eating plan approximately 90 % of the time. She states she is walking for 20 minutes 3 times per week. Jennifer Frazier weight is 237 lbs and she has lost 1 lbs since her last telehealth visit. She is down 57 lbs overall. She is sticking to the plan very well (90%).  We were unable to weigh the patient today for this TeleHealth visit. She feels as if she has lost 1 lb since her last visit. She has lost 46 lbs since starting treatment with [name].  Hypertension Jennifer Frazier is a 43 y.o. female with hypertension. Jennifer Frazier blood pressure is well controlled on Norvasc 10 mg daily. Her blood pressure is 133/82 at home today. She denies chest pain or shortness of breath. She is working on weight loss to help control her blood pressure with the goal of decreasing her risk of heart attack and stroke.  BP Readings from Last 3 Encounters:  03/28/18 134/85  03/08/18 132/70  03/07/18 126/82    Insulin Resistance Jennifer Frazier has a diagnosis of insulin resistance based on her elevated fasting insulin level >5. Although Jennifer Frazier blood glucose readings are still under good control, insulin resistance puts her at greater risk of metabolic syndrome and diabetes. She is not taking metformin currently and notes she has occasional polyphagia in the afternoon. She continues to work on diet and exercise to decrease risk of diabetes. Lab Results  Component Value Date   HGBA1C 5.1  12/16/2017    ASSESSMENT AND PLAN:  Essential hypertension - Plan: amLODipine (NORVASC) 10 MG tablet  Insulin resistance  Class 2 severe obesity with serious comorbidity and body mass index (BMI) of 36.0 to 36.9 in adult, unspecified obesity type ([place])  PLAN:  Hypertension We discussed sodium restriction, working on healthy weight loss, and a regular exercise program as the means to achieve improved blood pressure control. Jennifer Frazier agreed with this plan and agreed to follow up as directed. We will continue to monitor her blood pressure as well as her progress with the above lifestyle modifications. Jennifer Frazier agrees to continue taking amlodipine 10 mg qd #30 and we will refill for 1 month. She will watch for signs of hypotension as she continues her lifestyle modifications. Jennifer Frazier agrees to follow up with our clinic in 2 weeks.  Insulin Resistance Jennifer Frazier will continue to work on weight loss, exercise, and decreasing simple carbohydrates in her diet to help decrease the risk of diabetes. We dicussed metformin including benefits and risks.Jennifer Frazier declined metformin for now and prescription was not written today. Jennifer Frazier agrees to follow up with our clinic in 2 weeks as directed to monitor her progress.  Obesity Jennifer Frazier is currently in the action stage of change. As such, her goal is to continue with weight loss efforts She has agreed to keep a food journal with 1100-1300 calories and 90 grams of protein daily Jennifer Frazier is to increase protein at lunch. Jennifer Frazier has been instructed to work up to  a goal of 150 minutes of combined cardio and strengthening exercise per week for weight loss and overall health benefits. We discussed the following Behavioral Modification Strategies today: increasing lean protein intake and planning for success   Jennifer Frazier has agreed to follow up with our clinic in 2 weeks. She was informed of the importance of frequent follow up visits to maximize her success with intensive lifestyle  modifications for her multiple health conditions.  ALLERGIES: Allergies  Allergen Reactions   Gadolinium      Desc: NAUSEA WITH MRI CONTRAST-NO VOMITING    Tramadol Hives and Nausea Only    MEDICATIONS: Current Outpatient Medications on File Prior to Visit  Medication Sig Dispense Refill   calcium carbonate (TUMS - DOSED IN MG ELEMENTAL CALCIUM) 500 MG chewable tablet Chew 1 tablet by mouth daily as needed for indigestion or heartburn.     diphenhydrAMINE (BENADRYL) 25 MG tablet Take 25 mg by mouth every 6 (six) hours as needed.     tamoxifen (NOLVADEX) 20 MG tablet TAKE 1 TABLET BY MOUTH ONCE DAILY 30 tablet 3   No current facility-administered medications on file prior to visit.     PAST MEDICAL HISTORY: Past Medical History:  Diagnosis Date   Anemia    Anxiety    Cancer ([place])    Breast   Contact lens/glasses fitting    wears contacts or glasses   Depression    Fibroids    uterine fibroids   Hypertension    Hypothyroidism    Obesity    Personal history of chemotherapy    Personal history of radiation therapy    Radiation 04/04/14-05/21/14   Right upper-outer breast   Sleep apnea    has a cpap-does not always use it   Thyroid disease     PAST SURGICAL HISTORY: Past Surgical History:  Procedure Laterality Date   BREAST LUMPECTOMY Right 08/03/2013   BREAST SURGERY     lumpectomy 7/14   EYE SURGERY  2013   retinal tear-lazer-both   PORT A CATH REVISION N/A 12/19/2013   Procedure: REPOSITION PORT;  Surgeon: [name], MD;  Location: [facility];  Service: General;  Laterality: N/A;   PORT-A-CATH REMOVAL Right 08/08/2014   Procedure: REMOVAL PORT-A-CATH;  Surgeon: [name], MD;  Location: [facility];  Service: General;  Laterality: Right;   PORTACATH PLACEMENT Right 09/29/2013   Procedure: INSERTION PORT-A-CATH WITH ULTRA SOUND;  Surgeon: [name], MD;  Location: [place] [facility];  Service:  General;  Laterality: Right;   [place]  2012   urerine ablasion   WISDOM TOOTH EXTRACTION      SOCIAL HISTORY: Social History   Tobacco Use   Smoking status: Never Smoker   Smokeless tobacco: Never Used  Substance Use Topics   Alcohol use: No   Drug use: No    FAMILY HISTORY: Family History  Problem Relation Age of Onset   Cancer Father 64       colon   High blood pressure Mother    Stroke Mother    Anxiety disorder Mother    Sleep apnea Mother    Obesity Mother    Cancer Maternal Aunt 76       breast cancer   Cancer Paternal Aunt 64       breast cancer   Cancer Maternal Grandmother 50       ovarian or uterine cancer   Cancer Paternal Grandmother        unknown primary  ROS: Review of Systems  Constitutional: Positive for weight loss.  Respiratory: Negative for shortness of breath.   Cardiovascular: Negative for chest pain.  Endo/Heme/Allergies:       Positive polyphagia    PHYSICAL EXAM: Pt in no acute distress  RECENT LABS AND TESTS: BMET    Component Value Date/Time   NA 140 11/04/2017 1424   NA 141 06/24/2017 1236   NA 140 11/04/2016 1500   K 3.7 11/04/2017 1424   K 3.8 11/04/2016 1500   CL 106 11/04/2017 1424   CO2 25 11/04/2017 1424   CO2 27 11/04/2016 1500   GLUCOSE 86 11/04/2017 1424   GLUCOSE 88 11/04/2016 1500   BUN 20 11/04/2017 1424   BUN 15 06/24/2017 1236   BUN 17.6 11/04/2016 1500   CREATININE 0.94 11/04/2017 1424   CREATININE 1.1 11/04/2016 1500   CALCIUM 10.2 11/04/2017 1424   CALCIUM 9.6 11/04/2016 1500   GFRNONAA >60 11/04/2017 1424   GFRAA >60 11/04/2017 1424   Lab Results  Component Value Date   HGBA1C 5.1 12/16/2017   HGBA1C 5.3 06/24/2017   Lab Results  Component Value Date   INSULIN 13.5 12/16/2017   INSULIN 6.2 06/24/2017   CBC    Component Value Date/Time   WBC 5.2 11/04/2017 1424   RBC 4.81 11/04/2017 1424   HGB 13.4 11/04/2017 1424   HGB 13.8 06/24/2017 1236   HGB 13.3 11/04/2016 1500     HCT 41.1 11/04/2017 1424   HCT 43.5 06/24/2017 1236   HCT 40.6 11/04/2016 1500   PLT 193 11/04/2017 1424   PLT 208 11/04/2016 1500   MCV 85.4 11/04/2017 1424   MCV 86 06/24/2017 1236   MCV 84.4 11/04/2016 1500   MCH 27.9 11/04/2017 1424   MCHC 32.6 11/04/2017 1424   RDW 13.0 11/04/2017 1424   RDW 14.8 06/24/2017 1236   RDW 13.9 11/04/2016 1500   LYMPHSABS 2.0 11/04/2017 1424   LYMPHSABS 2.5 06/24/2017 1236   LYMPHSABS 2.0 11/04/2016 1500   MONOABS 0.3 11/04/2017 1424   MONOABS 0.3 11/04/2016 1500   EOSABS 0.1 11/04/2017 1424   EOSABS 0.1 06/24/2017 1236   BASOSABS 0.0 11/04/2017 1424   BASOSABS 0.0 06/24/2017 1236   BASOSABS 0.0 11/04/2016 1500   Iron/TIBC/Ferritin/ %Sat No results found for: IRON, TIBC, FERRITIN, IRONPCTSAT Lipid Panel     Component Value Date/Time   CHOL 128 06/24/2017 1236   TRIG 80 06/24/2017 1236   HDL 57 06/24/2017 1236   LDLCALC 55 06/24/2017 1236   Hepatic Function Panel     Component Value Date/Time   PROT 7.8 11/04/2017 1424   PROT 7.4 06/24/2017 1236   PROT 7.5 11/04/2016 1500   ALBUMIN 3.9 11/04/2017 1424   ALBUMIN 4.4 06/24/2017 1236   ALBUMIN 3.8 11/04/2016 1500   AST 14 (L) 11/04/2017 1424   AST 20 11/04/2016 1500   ALT 10 11/04/2017 1424   ALT 15 11/04/2016 1500   ALKPHOS 67 11/04/2017 1424   ALKPHOS 87 11/04/2016 1500   BILITOT 0.4 11/04/2017 1424   BILITOT 0.3 06/24/2017 1236   BILITOT 0.31 11/04/2016 1500      Component Value Date/Time   TSH 2.030 06/24/2017 1236   TSH 0.129 (L) 08/28/2014 1620      I, Trixie Dredge, am acting as Location manager for [employer], FNP-C.  I have reviewed the above documentation for accuracy and completeness, and I agree with the above.  - Dawn Whitmire, FNP-C.

## 2018-07-19 ENCOUNTER — Encounter (INDEPENDENT_AMBULATORY_CARE_PROVIDER_SITE_OTHER): Payer: Self-pay | Admitting: Family Medicine

## 2018-07-19 ENCOUNTER — Other Ambulatory Visit: Payer: Self-pay

## 2018-07-19 ENCOUNTER — Telehealth (INDEPENDENT_AMBULATORY_CARE_PROVIDER_SITE_OTHER): Payer: 59 | Admitting: Family Medicine

## 2018-07-19 DIAGNOSIS — Z6836 Body mass index (BMI) 36.0-36.9, adult: Secondary | ICD-10-CM | POA: Diagnosis not present

## 2018-07-19 DIAGNOSIS — I10 Essential (primary) hypertension: Secondary | ICD-10-CM

## 2018-07-19 NOTE — Progress Notes (Signed)
Office: [phone]  /  Fax: [phone] TeleHealth Visit:  Jennifer Frazier has verbally consented to this TeleHealth visit today. The patient is located at home, the provider is located at the [facility] office. The participants in this visit include the listed provider and patient. The visit was conducted today via webex.  HPI:   Chief Complaint: OBESITY Jennifer Frazier is here to discuss her progress with her obesity treatment plan. She is on the keep a food journal with 1100-1300 calories and 90 grams of protein daily and is following her eating plan approximately 90 % of the time. She states she is on the treadmill for 45 minutes 3 times per week, and walking for 20 minutes 1 time per week. Jennifer Frazier is up 1 lb and is at 238 lbs today. She has increased her exercise. She has added some resistance training. She is journaling consistently and she is meeting protein goal daily.  We were unable to weigh the patient today for this TeleHealth visit. She feels as if she has gained 1 lb since her last visit. She has lost 46 lbs since starting treatment with [name].  Hypertension [name] is a 43 y.o. female with hypertension. Jennifer Frazier's blood pressure is 132/87 at home today, and well controlled. She denies chest pain or shortness of breath. She is working on weight loss to help control her blood pressure with the goal of decreasing her risk of heart attack and stroke. BP Readings from Last 3 Encounters:  03/28/18 134/85  03/08/18 132/70  03/07/18 126/82     ASSESSMENT AND PLAN:   ICD-10-CM   1. Essential hypertension  I10   2. Class 2 severe obesity with serious comorbidity and body mass index (BMI) of 36.0 to 36.9 in adult, unspecified obesity type ([place])  E66.01    Z68.36    PLAN:  Hypertension We discussed sodium restriction, working on healthy weight loss, and a regular exercise program as the means to achieve improved blood pressure control. Jennifer Frazier agreed with this plan and agreed  to follow up as directed. We will continue to monitor her blood pressure as well as her progress with the above lifestyle modifications. Jennifer Frazier agrees to continue taking Norvasc and will watch for signs of hypotension as she continues her lifestyle modifications. Jennifer Frazier agrees to follow up with our clinic in 2 to 3 weeks.  I spent > than 50% of the 15 minute visit on counseling as documented in the note.  Obesity Jennifer Frazier is currently in the action stage of change. As such, her goal is to continue with weight loss efforts She has agreed to keep a food journal with 1100-1300 calories and 90 grams of protein daily Jennifer Frazier will continue current exercise regimen for weight loss and overall health benefits.  We discussed the following Behavioral Modification Strategies today: increasing lean protein intake, increase H20 intake, and planning for success Encouragement was provided. Jennifer Frazier has hit a plateau but she is sticking to the plan well.  Jennifer Frazier has agreed to follow up with our clinic in 2 to 3 weeks. She was informed of the importance of frequent follow up visits to maximize her success with intensive lifestyle modifications for her multiple health conditions.  ALLERGIES: Allergies  Allergen Reactions  . Gadolinium      Desc: NAUSEA WITH MRI CONTRAST-NO VOMITING   . Tramadol Hives and Nausea Only    MEDICATIONS: Current Outpatient Medications on File Prior to Visit  Medication Sig Dispense Refill  . amLODipine (  NORVASC) 10 MG tablet Take 1 tablet (10 mg total) by mouth daily. 30 tablet 0  . calcium carbonate (TUMS - DOSED IN MG ELEMENTAL CALCIUM) 500 MG chewable tablet Chew 1 tablet by mouth daily as needed for indigestion or heartburn.    . diphenhydrAMINE (BENADRYL) 25 MG tablet Take 25 mg by mouth every 6 (six) hours as needed.    . tamoxifen (NOLVADEX) 20 MG tablet TAKE 1 TABLET BY MOUTH ONCE DAILY 30 tablet 3   No current facility-administered medications on file prior to visit.      PAST MEDICAL HISTORY: Past Medical History:  Diagnosis Date  . Anemia   . Anxiety   . Cancer ([place])    Breast  . Contact lens/glasses fitting    wears contacts or glasses  . Depression   . Fibroids    uterine fibroids  . Hypertension   . Hypothyroidism   . Obesity   . Personal history of chemotherapy   . Personal history of radiation therapy   . Radiation 04/04/14-05/21/14   Right upper-outer breast  . Sleep apnea    has a cpap-does not always use it  . Thyroid disease     PAST SURGICAL HISTORY: Past Surgical History:  Procedure Laterality Date  . BREAST LUMPECTOMY Right 08/03/2013  . BREAST SURGERY     lumpectomy 7/14  . EYE SURGERY  2013   retinal tear-lazer-both  . PORT A CATH REVISION N/A 12/19/2013   Procedure: REPOSITION PORT;  Surgeon: [name], MD;  Location: [place];  Service: General;  Laterality: N/A;  . PORT-A-CATH REMOVAL Right 08/08/2014   Procedure: REMOVAL PORT-A-CATH;  Surgeon: [name], MD;  Location: [place];  Service: General;  Laterality: Right;  . PORTACATH PLACEMENT Right 09/29/2013   Procedure: INSERTION PORT-A-CATH WITH ULTRA SOUND;  Surgeon: [name], MD;  Location: [place];  Service: General;  Laterality: Right;  . [place]  2012   urerine ablasion  . WISDOM TOOTH EXTRACTION      SOCIAL HISTORY: Social History   Tobacco Use  . Smoking status: Never Smoker  . Smokeless tobacco: Never Used  Substance Use Topics  . Alcohol use: No  . Drug use: No    FAMILY HISTORY: Family History  Problem Relation Age of Onset  . Cancer Father 28       colon  . High blood pressure Mother   . Stroke Mother   . Anxiety disorder Mother   . Sleep apnea Mother   . Obesity Mother   . Cancer Maternal Aunt 50       breast cancer  . Cancer Paternal Aunt 56       breast cancer  . Cancer Maternal Grandmother 50       ovarian or uterine cancer  . Cancer Paternal Grandmother        unknown  primary    ROS: Review of Systems  Constitutional: Negative for weight loss.  Respiratory: Negative for shortness of breath.   Cardiovascular: Negative for chest pain.    PHYSICAL EXAM: Pt in no acute distress  RECENT LABS AND TESTS: BMET    Component Value Date/Time   NA 140 11/04/2017 1424   NA 141 06/24/2017 1236   NA 140 11/04/2016 1500   K 3.7 11/04/2017 1424   K 3.8 11/04/2016 1500   CL 106 11/04/2017 1424   CO2 25 11/04/2017 1424   CO2 27 11/04/2016 1500   GLUCOSE 86 11/04/2017 1424  GLUCOSE 88 11/04/2016 1500   BUN 20 11/04/2017 1424   BUN 15 06/24/2017 1236   BUN 17.6 11/04/2016 1500   CREATININE 0.94 11/04/2017 1424   CREATININE 1.1 11/04/2016 1500   CALCIUM 10.2 11/04/2017 1424   CALCIUM 9.6 11/04/2016 1500   GFRNONAA >60 11/04/2017 1424   GFRAA >60 11/04/2017 1424   Lab Results  Component Value Date   HGBA1C 5.1 12/16/2017   HGBA1C 5.3 06/24/2017   Lab Results  Component Value Date   INSULIN 13.5 12/16/2017   INSULIN 6.2 06/24/2017   CBC    Component Value Date/Time   WBC 5.2 11/04/2017 1424   RBC 4.81 11/04/2017 1424   HGB 13.4 11/04/2017 1424   HGB 13.8 06/24/2017 1236   HGB 13.3 11/04/2016 1500   HCT 41.1 11/04/2017 1424   HCT 43.5 06/24/2017 1236   HCT 40.6 11/04/2016 1500   PLT 193 11/04/2017 1424   PLT 208 11/04/2016 1500   MCV 85.4 11/04/2017 1424   MCV 86 06/24/2017 1236   MCV 84.4 11/04/2016 1500   MCH 27.9 11/04/2017 1424   MCHC 32.6 11/04/2017 1424   RDW 13.0 11/04/2017 1424   RDW 14.8 06/24/2017 1236   RDW 13.9 11/04/2016 1500   LYMPHSABS 2.0 11/04/2017 1424   LYMPHSABS 2.5 06/24/2017 1236   LYMPHSABS 2.0 11/04/2016 1500   MONOABS 0.3 11/04/2017 1424   MONOABS 0.3 11/04/2016 1500   EOSABS 0.1 11/04/2017 1424   EOSABS 0.1 06/24/2017 1236   BASOSABS 0.0 11/04/2017 1424   BASOSABS 0.0 06/24/2017 1236   BASOSABS 0.0 11/04/2016 1500   Iron/TIBC/Ferritin/ %Sat No results found for: IRON, TIBC, FERRITIN, IRONPCTSAT  Lipid Panel     Component Value Date/Time   CHOL 128 06/24/2017 1236   TRIG 80 06/24/2017 1236   HDL 57 06/24/2017 1236   LDLCALC 55 06/24/2017 1236   Hepatic Function Panel     Component Value Date/Time   PROT 7.8 11/04/2017 1424   PROT 7.4 06/24/2017 1236   PROT 7.5 11/04/2016 1500   ALBUMIN 3.9 11/04/2017 1424   ALBUMIN 4.4 06/24/2017 1236   ALBUMIN 3.8 11/04/2016 1500   AST 14 (L) 11/04/2017 1424   AST 20 11/04/2016 1500   ALT 10 11/04/2017 1424   ALT 15 11/04/2016 1500   ALKPHOS 67 11/04/2017 1424   ALKPHOS 87 11/04/2016 1500   BILITOT 0.4 11/04/2017 1424   BILITOT 0.3 06/24/2017 1236   BILITOT 0.31 11/04/2016 1500      Component Value Date/Time   TSH 2.030 06/24/2017 1236   TSH 0.129 (L) 08/28/2014 1620      I, Trixie Dredge, am acting as Location manager for [employer], FNP-C  I have reviewed the above documentation for accuracy and completeness, and I agree with the above.  - Danial Hlavac, FNP-C.

## 2018-08-03 ENCOUNTER — Other Ambulatory Visit: Payer: Self-pay

## 2018-08-03 ENCOUNTER — Encounter (INDEPENDENT_AMBULATORY_CARE_PROVIDER_SITE_OTHER): Payer: Self-pay | Admitting: Physician Assistant

## 2018-08-03 ENCOUNTER — Ambulatory Visit (INDEPENDENT_AMBULATORY_CARE_PROVIDER_SITE_OTHER): Payer: 59 | Admitting: Physician Assistant

## 2018-08-03 DIAGNOSIS — Z6836 Body mass index (BMI) 36.0-36.9, adult: Secondary | ICD-10-CM | POA: Diagnosis not present

## 2018-08-03 DIAGNOSIS — I10 Essential (primary) hypertension: Secondary | ICD-10-CM | POA: Diagnosis not present

## 2018-08-03 DIAGNOSIS — E8881 Metabolic syndrome: Secondary | ICD-10-CM

## 2018-08-03 MED ORDER — AMLODIPINE BESYLATE 10 MG PO TABS: 10.0000 mg | ORAL_TABLET | Freq: Every day | ORAL | 0 refills | Status: DC

## 2018-08-03 NOTE — Progress Notes (Signed)
Office: [phone]  /  Fax: [phone] TeleHealth Visit:  Jennifer Frazier has verbally consented to this TeleHealth visit today. The patient is located at home, the provider is located at the [facility] office. The participants in this visit include the listed provider and patient. The visit was conducted today via Webex.  HPI:   Chief Complaint: OBESITY Jennifer Frazier is here to discuss her progress with her obesity treatment plan. She is keeping a food journal with 1100-1300 calories and 90 grams of protein and is following her eating plan approximately 95% of the time. She states she is walking 20 minutes daily.  Jennifer Frazier states her most recent weight was 235 lbs. She notes being bored with dinner at times. We were unable to weigh the patient today for this TeleHealth visit. She feels as if she has lost 3 lbs since her last visit. She has lost 46 lbs since starting treatment with [name].  Hypertension LOYS HOSELTON is a 43 y.o. female with hypertension and is on Norvasc.  ISEL SKUFCA denies chest pain or headache. She is working weight loss to help control her blood pressure with the goal of decreasing her risk of heart attack and stroke.  Insulin Resistance Jennifer Frazier has a diagnosis of insulin resistance based on her elevated fasting insulin level >5. Although Jennifer Frazier's blood glucose readings are still under good control, insulin resistance puts her at greater risk of metabolic syndrome and diabetes. She is on no medications currently and continues to work on diet and exercise to decrease risk of diabetes. No polyphagia.  ASSESSMENT AND PLAN:  Essential hypertension - Plan: amLODipine (NORVASC) 10 MG tablet  Insulin resistance  Class 2 severe obesity with serious comorbidity and body mass index (BMI) of 36.0 to 36.9 in adult, unspecified obesity type ([place])  PLAN:  Hypertension We discussed sodium restriction, working on healthy weight loss, and a regular exercise program as the  means to achieve improved blood pressure control. [name] agreed with this plan and agreed to follow up as directed. We will continue to monitor her blood pressure as well as her progress with the above lifestyle modifications. Jaquelin was given a refill on her Norvasc #30 with 0 refills and agrees to follow-up with our clinic in 2 weeks. She will watch for signs of hypotension as she continues her lifestyle modifications.  Insulin Resistance Jennifer Frazier will continue to work on weight loss, exercise, and decreasing simple carbohydrates in her diet to help decrease the risk of diabetes. We dicussed metformin including benefits and risks. She was informed that eating too many simple carbohydrates or too many calories at one sitting increases the likelihood of GI side effects. Jennifer Frazier will continue with weight loss and follow-up with [name] as directed to monitor her progress.  Obesity Jennifer Frazier is currently in the action stage of change. As such, her goal is to continue with weight loss efforts. She has agreed to keep a food journal with 1100-1300 calories and 90 grams of protein daily. Jennifer Frazier has been instructed to work up to a goal of 150 minutes of combined cardio and strengthening exercise per week for weight loss and overall health benefits. We discussed the following Behavioral Modification Strategies today: work on meal planning, easy cooking plans, and keeping healthy foods in the home.  Jennifer Frazier has agreed to follow-up with our clinic in 2 weeks. She was informed of the importance of frequent follow-up visits to maximize her success with intensive lifestyle modifications for her multiple health conditions.  I spent > than 50% of the 25 minute visit on counseling as documented in the note.    ALLERGIES: Allergies  Allergen Reactions   Gadolinium      Desc: NAUSEA WITH MRI CONTRAST-NO VOMITING    Tramadol Hives and Nausea Only    MEDICATIONS: Current Outpatient Medications on File Prior to Visit    Medication Sig Dispense Refill   calcium carbonate (TUMS - DOSED IN MG ELEMENTAL CALCIUM) 500 MG chewable tablet Chew 1 tablet by mouth daily as needed for indigestion or heartburn.     diphenhydrAMINE (BENADRYL) 25 MG tablet Take 25 mg by mouth every 6 (six) hours as needed.     tamoxifen (NOLVADEX) 20 MG tablet TAKE 1 TABLET BY MOUTH ONCE DAILY 30 tablet 3   No current facility-administered medications on file prior to visit.     PAST MEDICAL HISTORY: Past Medical History:  Diagnosis Date   Anemia    Anxiety    Cancer ([place])    Breast   Contact lens/glasses fitting    wears contacts or glasses   Depression    Fibroids    uterine fibroids   Hypertension    Hypothyroidism    Obesity    Personal history of chemotherapy    Personal history of radiation therapy    Radiation 04/04/14-05/21/14   Right upper-outer breast   Sleep apnea    has a cpap-does not always use it   Thyroid disease     PAST SURGICAL HISTORY: Past Surgical History:  Procedure Laterality Date   BREAST LUMPECTOMY Right 08/03/2013   BREAST SURGERY     lumpectomy 7/14   EYE SURGERY  2013   retinal tear-lazer-both   PORT A CATH REVISION N/A 12/19/2013   Procedure: REPOSITION PORT;  Surgeon: [name], MD;  Location: [place];  Service: General;  Laterality: N/A;   PORT-A-CATH REMOVAL Right 08/08/2014   Procedure: REMOVAL PORT-A-CATH;  Surgeon: [name], MD;  Location: [place];  Service: General;  Laterality: Right;   PORTACATH PLACEMENT Right 09/29/2013   Procedure: INSERTION PORT-A-CATH WITH ULTRA SOUND;  Surgeon: [name], MD;  Location: [place];  Service: General;  Laterality: Right;   [place]  2012   urerine ablasion   WISDOM TOOTH EXTRACTION      SOCIAL HISTORY: Social History   Tobacco Use   Smoking status: Never Smoker   Smokeless tobacco: Never Used  Substance Use Topics   Alcohol use: No   Drug use:  No    FAMILY HISTORY: Family History  Problem Relation Age of Onset   Cancer Father 9       colon   High blood pressure Mother    Stroke Mother    Anxiety disorder Mother    Sleep apnea Mother    Obesity Mother    Cancer Maternal Aunt 6       breast cancer   Cancer Paternal Aunt 45       breast cancer   Cancer Maternal Grandmother 53       ovarian or uterine cancer   Cancer Paternal Grandmother        unknown primary   ROS: Review of Systems  Cardiovascular: Negative for chest pain.  Neurological: Negative for headaches.  Endo/Heme/Allergies:       Negative for polydipsia.   PHYSICAL EXAM: Pt in no acute distress  RECENT LABS AND TESTS: BMET    Component Value Date/Time   NA 140 11/04/2017 1424  NA 141 06/24/2017 1236   NA 140 11/04/2016 1500   K 3.7 11/04/2017 1424   K 3.8 11/04/2016 1500   CL 106 11/04/2017 1424   CO2 25 11/04/2017 1424   CO2 27 11/04/2016 1500   GLUCOSE 86 11/04/2017 1424   GLUCOSE 88 11/04/2016 1500   BUN 20 11/04/2017 1424   BUN 15 06/24/2017 1236   BUN 17.6 11/04/2016 1500   CREATININE 0.94 11/04/2017 1424   CREATININE 1.1 11/04/2016 1500   CALCIUM 10.2 11/04/2017 1424   CALCIUM 9.6 11/04/2016 1500   GFRNONAA >60 11/04/2017 1424   GFRAA >60 11/04/2017 1424   Lab Results  Component Value Date   HGBA1C 5.1 12/16/2017   HGBA1C 5.3 06/24/2017   Lab Results  Component Value Date   INSULIN 13.5 12/16/2017   INSULIN 6.2 06/24/2017   CBC    Component Value Date/Time   WBC 5.2 11/04/2017 1424   RBC 4.81 11/04/2017 1424   HGB 13.4 11/04/2017 1424   HGB 13.8 06/24/2017 1236   HGB 13.3 11/04/2016 1500   HCT 41.1 11/04/2017 1424   HCT 43.5 06/24/2017 1236   HCT 40.6 11/04/2016 1500   PLT 193 11/04/2017 1424   PLT 208 11/04/2016 1500   MCV 85.4 11/04/2017 1424   MCV 86 06/24/2017 1236   MCV 84.4 11/04/2016 1500   MCH 27.9 11/04/2017 1424   MCHC 32.6 11/04/2017 1424   RDW 13.0 11/04/2017 1424   RDW 14.8  06/24/2017 1236   RDW 13.9 11/04/2016 1500   LYMPHSABS 2.0 11/04/2017 1424   LYMPHSABS 2.5 06/24/2017 1236   LYMPHSABS 2.0 11/04/2016 1500   MONOABS 0.3 11/04/2017 1424   MONOABS 0.3 11/04/2016 1500   EOSABS 0.1 11/04/2017 1424   EOSABS 0.1 06/24/2017 1236   BASOSABS 0.0 11/04/2017 1424   BASOSABS 0.0 06/24/2017 1236   BASOSABS 0.0 11/04/2016 1500   Iron/TIBC/Ferritin/ %Sat No results found for: IRON, TIBC, FERRITIN, IRONPCTSAT Lipid Panel     Component Value Date/Time   CHOL 128 06/24/2017 1236   TRIG 80 06/24/2017 1236   HDL 57 06/24/2017 1236   LDLCALC 55 06/24/2017 1236   Hepatic Function Panel     Component Value Date/Time   PROT 7.8 11/04/2017 1424   PROT 7.4 06/24/2017 1236   PROT 7.5 11/04/2016 1500   ALBUMIN 3.9 11/04/2017 1424   ALBUMIN 4.4 06/24/2017 1236   ALBUMIN 3.8 11/04/2016 1500   AST 14 (L) 11/04/2017 1424   AST 20 11/04/2016 1500   ALT 10 11/04/2017 1424   ALT 15 11/04/2016 1500   ALKPHOS 67 11/04/2017 1424   ALKPHOS 87 11/04/2016 1500   BILITOT 0.4 11/04/2017 1424   BILITOT 0.3 06/24/2017 1236   BILITOT 0.31 11/04/2016 1500      Component Value Date/Time   TSH 2.030 06/24/2017 1236   TSH 0.129 (L) 08/28/2014 1620   Results for EMMELIA, HOLDSWORTH (MRN [id number]) as of 08/03/2018 17:15  Ref. Range 12/16/2017 08:51  Vitamin D, 25-Hydroxy Latest Ref Range: 30.0 - 100.0 ng/mL 59.8   I, Michaelene Song, am acting as Location manager for [employer], PA-C I, Abby Potash, PA-C have reviewed above note and agree with its content

## 2018-08-04 ENCOUNTER — Ambulatory Visit: Payer: Self-pay | Admitting: Neurology

## 2018-08-17 ENCOUNTER — Ambulatory Visit (INDEPENDENT_AMBULATORY_CARE_PROVIDER_SITE_OTHER): Payer: 59 | Admitting: Physician Assistant

## 2018-08-17 ENCOUNTER — Other Ambulatory Visit: Payer: Self-pay

## 2018-08-17 ENCOUNTER — Encounter (INDEPENDENT_AMBULATORY_CARE_PROVIDER_SITE_OTHER): Payer: Self-pay | Admitting: Physician Assistant

## 2018-08-17 DIAGNOSIS — Z6836 Body mass index (BMI) 36.0-36.9, adult: Secondary | ICD-10-CM

## 2018-08-17 DIAGNOSIS — E8881 Metabolic syndrome: Secondary | ICD-10-CM

## 2018-08-18 NOTE — Progress Notes (Signed)
Office: [phone]  /  Fax: [phone] TeleHealth Visit:  ZAIAH CREDEUR has verbally consented to this TeleHealth visit today. The patient is located at home, the provider is located at the [facility] office. The participants in this visit include the listed provider and patient. The visit was conducted today via Webex (30 minutes).  HPI:   Chief Complaint: OBESITY Samoria is here to discuss her progress with her obesity treatment plan. She is keeping a food journal with 1100-1300 calories and 90 grams of protein and is following her eating plan approximately 100% of the time. She states she is walking 20-30 minutes 7 times per week. Arianni states her most recent weight was 234 lbs. She reports that she has started exercising regularly. Her hunger is controlled and she reports choosing more vegetarian options. We were unable to weigh the patient today for this TeleHealth visit. She feels as if she has lost weight since her last visit. She has lost 46 lbs since starting treatment with [name].  Insulin Resistance Tniyah has a diagnosis of insulin resistance based on her elevated fasting insulin level >5. Although Carlyle's blood glucose readings are still under good control, insulin resistance puts her at greater risk of metabolic syndrome and diabetes. She is on no medication currently and continues to work on diet and exercise to decrease risk of diabetes. No polyphagia.  ASSESSMENT AND PLAN:  No diagnosis found.  PLAN:  Insulin Resistance Isaly will continue to work on weight loss, exercise, and decreasing simple carbohydrates in her diet to help decrease the risk of diabetes. We dicussed metformin including benefits and risks. She was informed that eating too many simple carbohydrates or too many calories at one sitting increases the likelihood of GI side effects. Moncerrath will continue with weight loss and follow-up with [name] as directed to monitor her progress.  Obesity Meta is  currently in the action stage of change. As such, her goal is to continue with weight loss efforts. She has agreed to keep a food journal with 1100-1300 calories and 90 grams of protein daily. Aissa has been instructed to work up to a goal of 150 minutes of combined cardio and strengthening exercise per week for weight loss and overall health benefits. We discussed the following Behavioral Modification Strategies today: work on meal planning and easy cooking plans, and keeping healthy foods in the home.  Liberty has agreed to follow-up with our clinic in 2 weeks. She was informed of the importance of frequent follow-up visits to maximize her success with intensive lifestyle modifications for her multiple health conditions.  I spent > than 50% of the 25 minute visit on counseling as documented in the note.  I spent > than 50% of the 25 minute visit on counseling as documented in the note.    ALLERGIES: Allergies  Allergen Reactions   Gadolinium      Desc: NAUSEA WITH MRI CONTRAST-NO VOMITING    Tramadol Hives and Nausea Only    MEDICATIONS: Current Outpatient Medications on File Prior to Visit  Medication Sig Dispense Refill   amLODipine (NORVASC) 10 MG tablet Take 1 tablet (10 mg total) by mouth daily. 30 tablet 0   calcium carbonate (TUMS - DOSED IN MG ELEMENTAL CALCIUM) 500 MG chewable tablet Chew 1 tablet by mouth daily as needed for indigestion or heartburn.     clindamycin (CLEOCIN) 300 MG capsule      diphenhydrAMINE (BENADRYL) 25 MG tablet Take 25 mg by mouth every 6 (  six) hours as needed.     oxybutynin (DITROPAN-XL) 5 MG 24 hr tablet      tamoxifen (NOLVADEX) 20 MG tablet TAKE 1 TABLET BY MOUTH ONCE DAILY 30 tablet 3   No current facility-administered medications on file prior to visit.     PAST MEDICAL HISTORY: Past Medical History:  Diagnosis Date   Anemia    Anxiety    Cancer ([place])    Breast   Contact lens/glasses fitting    wears contacts or glasses     Depression    Fibroids    uterine fibroids   Hypertension    Hypothyroidism    Obesity    Personal history of chemotherapy    Personal history of radiation therapy    Radiation 04/04/14-05/21/14   Right upper-outer breast   Sleep apnea    has a cpap-does not always use it   Thyroid disease     PAST SURGICAL HISTORY: Past Surgical History:  Procedure Laterality Date   BREAST LUMPECTOMY Right 08/03/2013   BREAST SURGERY     lumpectomy 7/14   EYE SURGERY  2013   retinal tear-lazer-both   PORT A CATH REVISION N/A 12/19/2013   Procedure: REPOSITION PORT;  Surgeon: [name], MD;  Location: [place];  Service: General;  Laterality: N/A;   PORT-A-CATH REMOVAL Right 08/08/2014   Procedure: REMOVAL PORT-A-CATH;  Surgeon: [name], MD;  Location: [place];  Service: General;  Laterality: Right;   PORTACATH PLACEMENT Right 09/29/2013   Procedure: INSERTION PORT-A-CATH WITH ULTRA SOUND;  Surgeon: [name], MD;  Location: [place];  Service: General;  Laterality: Right;   [place]  2012   urerine ablasion   WISDOM TOOTH EXTRACTION      SOCIAL HISTORY: Social History   Tobacco Use   Smoking status: Never Smoker   Smokeless tobacco: Never Used  Substance Use Topics   Alcohol use: No   Drug use: No    FAMILY HISTORY: Family History  Problem Relation Age of Onset   Cancer Father 92       colon   High blood pressure Mother    Stroke Mother    Anxiety disorder Mother    Sleep apnea Mother    Obesity Mother    Cancer Maternal Aunt 24       breast cancer   Cancer Paternal Aunt 34       breast cancer   Cancer Maternal Grandmother 39       ovarian or uterine cancer   Cancer Paternal Grandmother        unknown primary   ROS: Review of Systems  Endo/Heme/Allergies:       Negative for polyphagia.   PHYSICAL EXAM: Pt in no acute distress  RECENT LABS AND TESTS: BMET    Component  Value Date/Time   NA 140 11/04/2017 1424   NA 141 06/24/2017 1236   NA 140 11/04/2016 1500   K 3.7 11/04/2017 1424   K 3.8 11/04/2016 1500   CL 106 11/04/2017 1424   CO2 25 11/04/2017 1424   CO2 27 11/04/2016 1500   GLUCOSE 86 11/04/2017 1424   GLUCOSE 88 11/04/2016 1500   BUN 20 11/04/2017 1424   BUN 15 06/24/2017 1236   BUN 17.6 11/04/2016 1500   CREATININE 0.94 11/04/2017 1424   CREATININE 1.1 11/04/2016 1500   CALCIUM 10.2 11/04/2017 1424   CALCIUM 9.6 11/04/2016 1500   GFRNONAA >60 11/04/2017 1424   GFRAA >60  11/04/2017 1424   Lab Results  Component Value Date   HGBA1C 5.1 12/16/2017   HGBA1C 5.3 06/24/2017   Lab Results  Component Value Date   INSULIN 13.5 12/16/2017   INSULIN 6.2 06/24/2017   CBC    Component Value Date/Time   WBC 5.2 11/04/2017 1424   RBC 4.81 11/04/2017 1424   HGB 13.4 11/04/2017 1424   HGB 13.8 06/24/2017 1236   HGB 13.3 11/04/2016 1500   HCT 41.1 11/04/2017 1424   HCT 43.5 06/24/2017 1236   HCT 40.6 11/04/2016 1500   PLT 193 11/04/2017 1424   PLT 208 11/04/2016 1500   MCV 85.4 11/04/2017 1424   MCV 86 06/24/2017 1236   MCV 84.4 11/04/2016 1500   MCH 27.9 11/04/2017 1424   MCHC 32.6 11/04/2017 1424   RDW 13.0 11/04/2017 1424   RDW 14.8 06/24/2017 1236   RDW 13.9 11/04/2016 1500   LYMPHSABS 2.0 11/04/2017 1424   LYMPHSABS 2.5 06/24/2017 1236   LYMPHSABS 2.0 11/04/2016 1500   MONOABS 0.3 11/04/2017 1424   MONOABS 0.3 11/04/2016 1500   EOSABS 0.1 11/04/2017 1424   EOSABS 0.1 06/24/2017 1236   BASOSABS 0.0 11/04/2017 1424   BASOSABS 0.0 06/24/2017 1236   BASOSABS 0.0 11/04/2016 1500   Iron/TIBC/Ferritin/ %Sat No results found for: IRON, TIBC, FERRITIN, IRONPCTSAT Lipid Panel     Component Value Date/Time   CHOL 128 06/24/2017 1236   TRIG 80 06/24/2017 1236   HDL 57 06/24/2017 1236   LDLCALC 55 06/24/2017 1236   Hepatic Function Panel     Component Value Date/Time   PROT 7.8 11/04/2017 1424   PROT 7.4 06/24/2017 1236     PROT 7.5 11/04/2016 1500   ALBUMIN 3.9 11/04/2017 1424   ALBUMIN 4.4 06/24/2017 1236   ALBUMIN 3.8 11/04/2016 1500   AST 14 (L) 11/04/2017 1424   AST 20 11/04/2016 1500   ALT 10 11/04/2017 1424   ALT 15 11/04/2016 1500   ALKPHOS 67 11/04/2017 1424   ALKPHOS 87 11/04/2016 1500   BILITOT 0.4 11/04/2017 1424   BILITOT 0.3 06/24/2017 1236   BILITOT 0.31 11/04/2016 1500      Component Value Date/Time   TSH 2.030 06/24/2017 1236   TSH 0.129 (L) 08/28/2014 1620   Results for DEMIA, VIERA (MRN [id number]) as of 08/18/2018 08:15  Ref. Range 12/16/2017 08:51  Vitamin D, 25-Hydroxy Latest Ref Range: 30.0 - 100.0 ng/mL 59.8   I, Michaelene Song, am acting as Location manager for [employer], PA-C I, Abby Potash, PA-C have reviewed above note and agree with its content

## 2018-09-05 ENCOUNTER — Encounter (INDEPENDENT_AMBULATORY_CARE_PROVIDER_SITE_OTHER): Payer: Self-pay | Admitting: Physician Assistant

## 2018-09-05 ENCOUNTER — Other Ambulatory Visit: Payer: Self-pay

## 2018-09-05 ENCOUNTER — Telehealth (INDEPENDENT_AMBULATORY_CARE_PROVIDER_SITE_OTHER): Payer: 59 | Admitting: Physician Assistant

## 2018-09-05 ENCOUNTER — Other Ambulatory Visit: Payer: Self-pay | Admitting: Obstetrics and Gynecology

## 2018-09-05 ENCOUNTER — Other Ambulatory Visit: Payer: Self-pay | Admitting: Family Medicine

## 2018-09-05 DIAGNOSIS — Z6836 Body mass index (BMI) 36.0-36.9, adult: Secondary | ICD-10-CM | POA: Diagnosis not present

## 2018-09-05 DIAGNOSIS — I10 Essential (primary) hypertension: Secondary | ICD-10-CM

## 2018-09-05 DIAGNOSIS — Z9889 Other specified postprocedural states: Secondary | ICD-10-CM

## 2018-09-05 MED ORDER — AMLODIPINE BESYLATE 10 MG PO TABS: 10.0000 mg | ORAL_TABLET | Freq: Every day | ORAL | 0 refills | Status: DC

## 2018-09-06 ENCOUNTER — Other Ambulatory Visit (INDEPENDENT_AMBULATORY_CARE_PROVIDER_SITE_OTHER): Payer: Self-pay | Admitting: Physician Assistant

## 2018-09-06 DIAGNOSIS — I10 Essential (primary) hypertension: Secondary | ICD-10-CM

## 2018-09-06 NOTE — Progress Notes (Signed)
Office: [phone]  /  Fax: [phone] TeleHealth Visit:  Jennifer Frazier has verbally consented to this TeleHealth visit today. The patient is located at home, the provider is located at the [employer] [facility] office. The participants in this visit include the listed provider and patient. The visit was conducted today via Webex.  HPI:   Chief Complaint: OBESITY Jennifer Frazier is here to discuss her progress with her obesity treatment plan. She is keeping a food journal with 1300 calories and 90 grams of protein and is following her eating plan approximately 100% of the time. She states she is walking 20-30 minutes every other day.  Jennifer Frazier's last weight was 231 lbs. She states that she isn't always getting all of her protein in. We were unable to weigh the patient today for this TeleHealth visit. She feels as if she has lost 3 lbs since her last visit. She has lost 46 lbs since starting treatment with [name].  Hypertension Jennifer Frazier is a 43 y.o. female with hypertension and is on amlodipine.  Jennifer Frazier denies chest pain or headache. She is working weight loss to help control her blood pressure with the goal of decreasing her risk of heart attack and stroke.  ASSESSMENT AND PLAN:  Essential hypertension - Plan: amLODipine (NORVASC) 10 MG tablet  Class 2 severe obesity with serious comorbidity and body mass index (BMI) of 36.0 to 36.9 in adult, unspecified obesity type ([place])  PLAN:  Hypertension We discussed sodium restriction, working on healthy weight loss, and a regular exercise program as the means to achieve improved blood pressure control. Jennifer Frazier agreed with this plan and agreed to follow up as directed. We will continue to monitor her blood pressure as well as her progress with the above lifestyle modifications. Jennifer Frazier was given a refill on her amlodipine #30 with 0 refills and agrees to follow-up with our clinic in 2 weeks. She will watch for signs of hypotension as she  continues her lifestyle modifications.  Obesity Jennifer Frazier is currently in the action stage of change. As such, her goal is to continue with weight loss efforts.  She has agreed to keep a food journal with 1300 calories and 90 grams of protein daily. Jennifer Frazier has been instructed to work up to a goal of 150 minutes of combined cardio and strengthening exercise per week for weight loss and overall health benefits. We discussed the following Behavioral Modification Strategies today: work on meal planning and easy cooking plans, and keeping healthy foods in the home.  Jennifer Frazier has agreed to follow-up with our clinic in 2 weeks. She was informed of the importance of frequent follow-up visits to maximize her success with intensive lifestyle modifications for her multiple health conditions.  I spent > than 50% of the 25 minute visit on counseling as documented in the note.    ALLERGIES: Allergies  Allergen Reactions   Gadolinium      Desc: NAUSEA WITH MRI CONTRAST-NO VOMITING    Tramadol Hives and Nausea Only    MEDICATIONS: Current Outpatient Medications on File Prior to Visit  Medication Sig Dispense Refill   calcium carbonate (TUMS - DOSED IN MG ELEMENTAL CALCIUM) 500 MG chewable tablet Chew 1 tablet by mouth daily as needed for indigestion or heartburn.     clindamycin (CLEOCIN) 300 MG capsule      diphenhydrAMINE (BENADRYL) 25 MG tablet Take 25 mg by mouth every 6 (six) hours as needed.     oxybutynin (DITROPAN-XL) 5 MG 24  hr tablet      tamoxifen (NOLVADEX) 20 MG tablet TAKE 1 TABLET BY MOUTH ONCE DAILY 30 tablet 3   No current facility-administered medications on file prior to visit.     PAST MEDICAL HISTORY: Past Medical History:  Diagnosis Date   Anemia    Anxiety    Cancer ([place])    Breast   Contact lens/glasses fitting    wears contacts or glasses   Depression    Fibroids    uterine fibroids   Hypertension    Hypothyroidism    Obesity    Personal history of  chemotherapy    Personal history of radiation therapy    Radiation 04/04/14-05/21/14   Right upper-outer breast   Sleep apnea    has a cpap-does not always use it   Thyroid disease     PAST SURGICAL HISTORY: Past Surgical History:  Procedure Laterality Date   BREAST LUMPECTOMY Right 08/03/2013   BREAST SURGERY     lumpectomy 7/14   EYE SURGERY  2013   retinal tear-lazer-both   PORT A CATH REVISION N/A 12/19/2013   Procedure: REPOSITION PORT;  Surgeon: [name], MD;  Location: [place];  Service: General;  Laterality: N/A;   PORT-A-CATH REMOVAL Right 08/08/2014   Procedure: REMOVAL PORT-A-CATH;  Surgeon: [name], MD;  Location: [place];  Service: General;  Laterality: Right;   PORTACATH PLACEMENT Right 09/29/2013   Procedure: INSERTION PORT-A-CATH WITH ULTRA SOUND;  Surgeon: [name], MD;  Location: [place];  Service: General;  Laterality: Right;   [place]  2012   urerine ablasion   WISDOM TOOTH EXTRACTION      SOCIAL HISTORY: Social History   Tobacco Use   Smoking status: Never Smoker   Smokeless tobacco: Never Used  Substance Use Topics   Alcohol use: No   Drug use: No    FAMILY HISTORY: Family History  Problem Relation Age of Onset   Cancer Father 58       colon   High blood pressure Mother    Stroke Mother    Anxiety disorder Mother    Sleep apnea Mother    Obesity Mother    Cancer Maternal Aunt 1       breast cancer   Cancer Paternal Aunt 54       breast cancer   Cancer Maternal Grandmother 39       ovarian or uterine cancer   Cancer Paternal Grandmother        unknown primary   ROS: Review of Systems  Cardiovascular: Negative for chest pain.  Neurological: Negative for headaches.   PHYSICAL EXAM: Pt in no acute distress  RECENT LABS AND TESTS: BMET    Component Value Date/Time   NA 140 11/04/2017 1424   NA 141 06/24/2017 1236   NA 140 11/04/2016 1500    K 3.7 11/04/2017 1424   K 3.8 11/04/2016 1500   CL 106 11/04/2017 1424   CO2 25 11/04/2017 1424   CO2 27 11/04/2016 1500   GLUCOSE 86 11/04/2017 1424   GLUCOSE 88 11/04/2016 1500   BUN 20 11/04/2017 1424   BUN 15 06/24/2017 1236   BUN 17.6 11/04/2016 1500   CREATININE 0.94 11/04/2017 1424   CREATININE 1.1 11/04/2016 1500   CALCIUM 10.2 11/04/2017 1424   CALCIUM 9.6 11/04/2016 1500   GFRNONAA >60 11/04/2017 1424   GFRAA >60 11/04/2017 1424   Lab Results  Component Value Date   HGBA1C 5.1  12/16/2017   HGBA1C 5.3 06/24/2017   Lab Results  Component Value Date   INSULIN 13.5 12/16/2017   INSULIN 6.2 06/24/2017   CBC    Component Value Date/Time   WBC 5.2 11/04/2017 1424   RBC 4.81 11/04/2017 1424   HGB 13.4 11/04/2017 1424   HGB 13.8 06/24/2017 1236   HGB 13.3 11/04/2016 1500   HCT 41.1 11/04/2017 1424   HCT 43.5 06/24/2017 1236   HCT 40.6 11/04/2016 1500   PLT 193 11/04/2017 1424   PLT 208 11/04/2016 1500   MCV 85.4 11/04/2017 1424   MCV 86 06/24/2017 1236   MCV 84.4 11/04/2016 1500   MCH 27.9 11/04/2017 1424   MCHC 32.6 11/04/2017 1424   RDW 13.0 11/04/2017 1424   RDW 14.8 06/24/2017 1236   RDW 13.9 11/04/2016 1500   LYMPHSABS 2.0 11/04/2017 1424   LYMPHSABS 2.5 06/24/2017 1236   LYMPHSABS 2.0 11/04/2016 1500   MONOABS 0.3 11/04/2017 1424   MONOABS 0.3 11/04/2016 1500   EOSABS 0.1 11/04/2017 1424   EOSABS 0.1 06/24/2017 1236   BASOSABS 0.0 11/04/2017 1424   BASOSABS 0.0 06/24/2017 1236   BASOSABS 0.0 11/04/2016 1500   Iron/TIBC/Ferritin/ %Sat No results found for: IRON, TIBC, FERRITIN, IRONPCTSAT Lipid Panel     Component Value Date/Time   CHOL 128 06/24/2017 1236   TRIG 80 06/24/2017 1236   HDL 57 06/24/2017 1236   LDLCALC 55 06/24/2017 1236   Hepatic Function Panel     Component Value Date/Time   PROT 7.8 11/04/2017 1424   PROT 7.4 06/24/2017 1236   PROT 7.5 11/04/2016 1500   ALBUMIN 3.9 11/04/2017 1424   ALBUMIN 4.4 06/24/2017 1236    ALBUMIN 3.8 11/04/2016 1500   AST 14 (L) 11/04/2017 1424   AST 20 11/04/2016 1500   ALT 10 11/04/2017 1424   ALT 15 11/04/2016 1500   ALKPHOS 67 11/04/2017 1424   ALKPHOS 87 11/04/2016 1500   BILITOT 0.4 11/04/2017 1424   BILITOT 0.3 06/24/2017 1236   BILITOT 0.31 11/04/2016 1500      Component Value Date/Time   TSH 2.030 06/24/2017 1236   TSH 0.129 (L) 08/28/2014 1620   Results for ADRIENA, MANFRE (MRN [id number]) as of 09/06/2018 08:45  Ref. Range 12/16/2017 08:51  Vitamin D, 25-Hydroxy Latest Ref Range: 30.0 - 100.0 ng/mL 59.8   I, Michaelene Song, am acting as Location manager for [employer], PA-C I, Abby Potash, PA-C have reviewed above note and agree with its content

## 2018-09-20 ENCOUNTER — Encounter (INDEPENDENT_AMBULATORY_CARE_PROVIDER_SITE_OTHER): Payer: Self-pay | Admitting: Family Medicine

## 2018-09-20 ENCOUNTER — Telehealth (INDEPENDENT_AMBULATORY_CARE_PROVIDER_SITE_OTHER): Payer: 59 | Admitting: Family Medicine

## 2018-09-20 ENCOUNTER — Other Ambulatory Visit: Payer: Self-pay

## 2018-09-20 DIAGNOSIS — G4733 Obstructive sleep apnea (adult) (pediatric): Secondary | ICD-10-CM | POA: Diagnosis not present

## 2018-09-20 DIAGNOSIS — Z6837 Body mass index (BMI) 37.0-37.9, adult: Secondary | ICD-10-CM

## 2018-09-20 NOTE — Progress Notes (Signed)
Office: [phone]  /  Fax: [phone] TeleHealth Visit:  Jennifer Frazier has verbally consented to this TeleHealth visit today. The patient is located at home, the provider is located at the [employer] [facility] office. The participants in this visit include the listed provider and patient. The visit was conducted today via Webex.  HPI:   Chief Complaint: OBESITY Jennifer Frazier is here to discuss her progress with her obesity treatment plan. She is keeping a food journal with 1450 calories and 90+ grams of protein and is following her eating plan approximately 75% of the time. She states she is walking 20 minutes every other day and doing resistance exercises with weights 15 minutes per day.  Jennifer Frazier states she weighed 229 lbs at home today and has lost 2 lbs since her last visit. She is journaling but does not use My Fitness Pal. She is meeting her protein goal 75% of the time. We were unable to weigh the patient today for this TeleHealth visit. She feels as if she has lost about 2 lbs since her last visit. She has lost 46 lbs since starting treatment with [name].  Obstructive Sleep Apnea (OSA) Jennifer Frazier has not started CPAP and has not scheduled follow-up with Dr. Brett Fairy. Recent sleep study showed OSA and CPAP was recommended.   ASSESSMENT AND PLAN:  OSA (obstructive sleep apnea)  Class 2 severe obesity with serious comorbidity and body mass index (BMI) of 37.0 to 37.9 in adult, unspecified obesity type ([place])  PLAN:  Obstructive Sleep Apnea (OSA) Jennifer Frazier was encouraged to follow-up with Dr. Brett Fairy. We discussed the benefits of treating her OSA.  Obesity Jennifer Frazier is currently in the action stage of change. As such, her goal is to continue with weight loss efforts. She has agreed to keep a food journal with 1100-1300 calories and 90 grams of protein daily. We discussed breakfast options with more protein. Jennifer Frazier has been instructed to increase cardio to 30 minutes 5 times per week for weight  loss and overall health benefits. We discussed the following Behavioral Modification Strategies today: increasing lean protein intake and planning for success.  Jennifer Frazier has agreed to follow-up with our clinic in 2-3 weeks. She was informed of the importance of frequent follow-up visits to maximize her success with intensive lifestyle modifications for her multiple health conditions.  ALLERGIES: Allergies  Allergen Reactions  . Gadolinium      Desc: NAUSEA WITH MRI CONTRAST-NO VOMITING   . Tramadol Hives and Nausea Only    MEDICATIONS: Current Outpatient Medications on File Prior to Visit  Medication Sig Dispense Refill  . amLODipine (NORVASC) 10 MG tablet Take 1 tablet (10 mg total) by mouth daily. 30 tablet 0  . calcium carbonate (TUMS - DOSED IN MG ELEMENTAL CALCIUM) 500 MG chewable tablet Chew 1 tablet by mouth daily as needed for indigestion or heartburn.    . diphenhydrAMINE (BENADRYL) 25 MG tablet Take 25 mg by mouth every 6 (six) hours as needed.    Jennifer Frazier Kitchen oxybutynin (DITROPAN-XL) 5 MG 24 hr tablet     . tamoxifen (NOLVADEX) 20 MG tablet TAKE 1 TABLET BY MOUTH ONCE DAILY 30 tablet 3   No current facility-administered medications on file prior to visit.     PAST MEDICAL HISTORY: Past Medical History:  Diagnosis Date  . Anemia   . Anxiety   . Cancer ([place])    Breast  . Contact lens/glasses fitting    wears contacts or glasses  . Depression   . Fibroids  uterine fibroids  . Hypertension   . Hypothyroidism   . Obesity   . Personal history of chemotherapy   . Personal history of radiation therapy   . Radiation 04/04/14-05/21/14   Right upper-outer breast  . Sleep apnea    has a cpap-does not always use it  . Thyroid disease     PAST SURGICAL HISTORY: Past Surgical History:  Procedure Laterality Date  . BREAST LUMPECTOMY Right 08/03/2013  . BREAST SURGERY     lumpectomy 7/14  . EYE SURGERY  2013   retinal tear-lazer-both  . PORT A CATH REVISION N/A 12/19/2013    Procedure: REPOSITION PORT;  Surgeon: [name], MD;  Location: [place];  Service: General;  Laterality: N/A;  . PORT-A-CATH REMOVAL Right 08/08/2014   Procedure: REMOVAL PORT-A-CATH;  Surgeon: [name], MD;  Location: [place];  Service: General;  Laterality: Right;  . PORTACATH PLACEMENT Right 09/29/2013   Procedure: INSERTION PORT-A-CATH WITH ULTRA SOUND;  Surgeon: [name], MD;  Location: [place];  Service: General;  Laterality: Right;  . [place]  2012   urerine ablasion  . WISDOM TOOTH EXTRACTION      SOCIAL HISTORY: Social History   Tobacco Use  . Smoking status: Never Smoker  . Smokeless tobacco: Never Used  Substance Use Topics  . Alcohol use: No  . Drug use: No    FAMILY HISTORY: Family History  Problem Relation Age of Onset  . Cancer Father 11       colon  . High blood pressure Mother   . Stroke Mother   . Anxiety disorder Mother   . Sleep apnea Mother   . Obesity Mother   . Cancer Maternal Aunt 50       breast cancer  . Cancer Paternal Aunt 24       breast cancer  . Cancer Maternal Grandmother 63       ovarian or uterine cancer  . Cancer Paternal Grandmother        unknown primary   ROS: Review of Systems  Respiratory:       Positive for obstructive sleep apnea.   PHYSICAL EXAM: Pt in no acute distress  RECENT LABS AND TESTS: BMET    Component Value Date/Time   NA 140 11/04/2017 1424   NA 141 06/24/2017 1236   NA 140 11/04/2016 1500   K 3.7 11/04/2017 1424   K 3.8 11/04/2016 1500   CL 106 11/04/2017 1424   CO2 25 11/04/2017 1424   CO2 27 11/04/2016 1500   GLUCOSE 86 11/04/2017 1424   GLUCOSE 88 11/04/2016 1500   BUN 20 11/04/2017 1424   BUN 15 06/24/2017 1236   BUN 17.6 11/04/2016 1500   CREATININE 0.94 11/04/2017 1424   CREATININE 1.1 11/04/2016 1500   CALCIUM 10.2 11/04/2017 1424   CALCIUM 9.6 11/04/2016 1500   GFRNONAA >60 11/04/2017 1424   GFRAA >60 11/04/2017 1424    Lab Results  Component Value Date   HGBA1C 5.1 12/16/2017   HGBA1C 5.3 06/24/2017   Lab Results  Component Value Date   INSULIN 13.5 12/16/2017   INSULIN 6.2 06/24/2017   CBC    Component Value Date/Time   WBC 5.2 11/04/2017 1424   RBC 4.81 11/04/2017 1424   HGB 13.4 11/04/2017 1424   HGB 13.8 06/24/2017 1236   HGB 13.3 11/04/2016 1500   HCT 41.1 11/04/2017 1424   HCT 43.5 06/24/2017 1236   HCT 40.6 11/04/2016 1500  PLT 193 11/04/2017 1424   PLT 208 11/04/2016 1500   MCV 85.4 11/04/2017 1424   MCV 86 06/24/2017 1236   MCV 84.4 11/04/2016 1500   MCH 27.9 11/04/2017 1424   MCHC 32.6 11/04/2017 1424   RDW 13.0 11/04/2017 1424   RDW 14.8 06/24/2017 1236   RDW 13.9 11/04/2016 1500   LYMPHSABS 2.0 11/04/2017 1424   LYMPHSABS 2.5 06/24/2017 1236   LYMPHSABS 2.0 11/04/2016 1500   MONOABS 0.3 11/04/2017 1424   MONOABS 0.3 11/04/2016 1500   EOSABS 0.1 11/04/2017 1424   EOSABS 0.1 06/24/2017 1236   BASOSABS 0.0 11/04/2017 1424   BASOSABS 0.0 06/24/2017 1236   BASOSABS 0.0 11/04/2016 1500   Iron/TIBC/Ferritin/ %Sat No results found for: IRON, TIBC, FERRITIN, IRONPCTSAT Lipid Panel     Component Value Date/Time   CHOL 128 06/24/2017 1236   TRIG 80 06/24/2017 1236   HDL 57 06/24/2017 1236   LDLCALC 55 06/24/2017 1236   Hepatic Function Panel     Component Value Date/Time   PROT 7.8 11/04/2017 1424   PROT 7.4 06/24/2017 1236   PROT 7.5 11/04/2016 1500   ALBUMIN 3.9 11/04/2017 1424   ALBUMIN 4.4 06/24/2017 1236   ALBUMIN 3.8 11/04/2016 1500   AST 14 (L) 11/04/2017 1424   AST 20 11/04/2016 1500   ALT 10 11/04/2017 1424   ALT 15 11/04/2016 1500   ALKPHOS 67 11/04/2017 1424   ALKPHOS 87 11/04/2016 1500   BILITOT 0.4 11/04/2017 1424   BILITOT 0.3 06/24/2017 1236   BILITOT 0.31 11/04/2016 1500      Component Value Date/Time   TSH 2.030 06/24/2017 1236   TSH 0.129 (L) 08/28/2014 1620   Results for BRAYANA, TOMS (MRN [id number]) as of 09/20/2018 15:51  Ref.  Range 12/16/2017 08:51  Vitamin D, 25-Hydroxy Latest Ref Range: 30.0 - 100.0 ng/mL 59.8   I, Michaelene Song, am acting as Location manager for [employer], FNP  I have reviewed the above documentation for accuracy and completeness, and I agree with the above.  - Jaisha Villacres, FNP-C.

## 2018-10-04 ENCOUNTER — Encounter (INDEPENDENT_AMBULATORY_CARE_PROVIDER_SITE_OTHER): Payer: Self-pay | Admitting: Family Medicine

## 2018-10-04 ENCOUNTER — Other Ambulatory Visit: Payer: Self-pay

## 2018-10-04 ENCOUNTER — Telehealth (INDEPENDENT_AMBULATORY_CARE_PROVIDER_SITE_OTHER): Payer: 59 | Admitting: Family Medicine

## 2018-10-04 DIAGNOSIS — E8881 Metabolic syndrome: Secondary | ICD-10-CM

## 2018-10-04 DIAGNOSIS — E88819 Insulin resistance, unspecified: Secondary | ICD-10-CM

## 2018-10-04 DIAGNOSIS — Z6836 Body mass index (BMI) 36.0-36.9, adult: Secondary | ICD-10-CM | POA: Diagnosis not present

## 2018-10-04 DIAGNOSIS — I10 Essential (primary) hypertension: Secondary | ICD-10-CM | POA: Diagnosis not present

## 2018-10-04 MED ORDER — AMLODIPINE BESYLATE 10 MG PO TABS: 10.0000 mg | ORAL_TABLET | Freq: Every day | ORAL | 0 refills | Status: DC

## 2018-10-05 NOTE — Progress Notes (Signed)
Office: [phone]  /  Fax: [phone] TeleHealth Visit:  Jennifer Frazier has verbally consented to this TeleHealth visit today. The patient is located at work, the provider is located at the [employer] [facility] office. The participants in this visit include the listed provider and patient and any and all parties involved. The visit was conducted today via WebEx.  HPI:   Chief Complaint: OBESITY Jennifer Frazier is here to discuss her progress with her obesity treatment plan. She is on the keep a food journal with 1200 calories and 90+ grams of protein with 300 calorie snack plan and she is following her eating plan approximately 50 % of the time. She states she is walking 20 minutes 2 times per week and doing weights 2 times per week. Jennifer Frazier feels her weight has maintained at 229 pounds (10/04/18). She has been off her routine due to training for her new job. She did not get all of her protein in. She will be able to get back to a normal routine today as her training has finished. We were unable to weigh the patient today for this TeleHealth visit. She feels as if she has maintained weight since her last visit. She has lost 46 lbs since starting treatment with [name].  Hypertension Jennifer Frazier is a 43 y.o. female with hypertension.Jennifer Frazier blood pressure is well controlled on 10 mg of Amlodipine. She has not checked her blood pressure at home. Jennifer Frazier denies chest pain or shortness of breath on exertion. She is working weight loss to help control her blood pressure with the goal of decreasing her risk of heart attack and stroke.   Insulin Resistance Jennifer Frazier has a diagnosis of insulin resistance based on her elevated fasting insulin level >5. Although Jennifer Frazier's blood glucose readings are still under good control, insulin resistance puts her at greater risk of metabolic syndrome and diabetes. She is not taking metformin currently and continues to work on diet and exercise to decrease risk of diabetes.  Jennifer Frazier denies polyphagia. Lab Results  Component Value Date   HGBA1C 5.1 12/16/2017    ASSESSMENT AND PLAN:  Essential hypertension - Plan: amLODipine (NORVASC) 10 MG tablet  Insulin resistance  Class 2 severe obesity with serious comorbidity and body mass index (BMI) of 36.0 to 36.9 in adult, unspecified obesity type ([place])  PLAN:  Hypertension We discussed sodium restriction, working on healthy weight loss, and a regular exercise program as the means to achieve improved blood pressure control. Jennifer Frazier agreed with this plan and agreed to follow up as directed. We will continue to monitor her blood pressure as well as her progress with the above lifestyle modifications. She agrees to take Norvasc 10 mg daily #30 with no refills. Jennifer Frazier will watch for signs of hypotension as she continues her lifestyle modifications.  Insulin Resistance Jennifer Frazier will continue to work on weight loss, exercise, and decreasing simple carbohydrates in her diet to help decrease the risk of diabetes. She was informed that eating too many simple carbohydrates or too many calories at one sitting increases the likelihood of GI side effects. Jennifer Frazier will continue with the meal plan and she agreed to follow up with [name] as directed to monitor her progress.  Obesity Jennifer Frazier is currently in the action stage of change. As such, her goal is to continue with weight loss efforts She has agreed to keep a food journal with 1200 to 1300 calories and 90+ grams of protein daily Jennifer Frazier will increase walking to 30 minutes  3 times per week for weight loss and overall health benefits. We discussed the following Behavioral Modification Strategies today: planning for success, increase H2O intake, increasing lean protein intake  Jennifer Frazier has agreed to follow up with our clinic in 2 weeks. She was informed of the importance of frequent follow up visits to maximize her success with intensive lifestyle modifications for her multiple health  conditions.  ALLERGIES: Allergies  Allergen Reactions   Gadolinium      Desc: NAUSEA WITH MRI CONTRAST-NO VOMITING    Tramadol Hives and Nausea Only    MEDICATIONS: Current Outpatient Medications on File Prior to Visit  Medication Sig Dispense Refill   calcium carbonate (TUMS - DOSED IN MG ELEMENTAL CALCIUM) 500 MG chewable tablet Chew 1 tablet by mouth daily as needed for indigestion or heartburn.     diphenhydrAMINE (BENADRYL) 25 MG tablet Take 25 mg by mouth every 6 (six) hours as needed.     oxybutynin (DITROPAN-XL) 5 MG 24 hr tablet      tamoxifen (NOLVADEX) 20 MG tablet TAKE 1 TABLET BY MOUTH ONCE DAILY 30 tablet 3   No current facility-administered medications on file prior to visit.     PAST MEDICAL HISTORY: Past Medical History:  Diagnosis Date   Anemia    Anxiety    Cancer ([place])    Breast   Contact lens/glasses fitting    wears contacts or glasses   Depression    Fibroids    uterine fibroids   Hypertension    Hypothyroidism    Obesity    Personal history of chemotherapy    Personal history of radiation therapy    Radiation 04/04/14-05/21/14   Right upper-outer breast   Sleep apnea    has a cpap-does not always use it   Thyroid disease     PAST SURGICAL HISTORY: Past Surgical History:  Procedure Laterality Date   BREAST LUMPECTOMY Right 08/03/2013   BREAST SURGERY     lumpectomy 7/14   EYE SURGERY  2013   retinal tear-lazer-both   PORT A CATH REVISION N/A 12/19/2013   Procedure: REPOSITION PORT;  Surgeon: [name], MD;  Location: [place];  Service: General;  Laterality: N/A;   PORT-A-CATH REMOVAL Right 08/08/2014   Procedure: REMOVAL PORT-A-CATH;  Surgeon: [name], MD;  Location: [place];  Service: General;  Laterality: Right;   PORTACATH PLACEMENT Right 09/29/2013   Procedure: INSERTION PORT-A-CATH WITH ULTRA SOUND;  Surgeon: [name], MD;  Location: [place];  Service: General;  Laterality: Right;   [place]  2012   urerine ablasion   WISDOM TOOTH EXTRACTION      SOCIAL HISTORY: Social History   Tobacco Use   Smoking status: Never Smoker   Smokeless tobacco: Never Used  Substance Use Topics   Alcohol use: No   Drug use: No    FAMILY HISTORY: Family History  Problem Relation Age of Onset   Cancer Father 64       colon   High blood pressure Mother    Stroke Mother    Anxiety disorder Mother    Sleep apnea Mother    Obesity Mother    Cancer Maternal Aunt 84       breast cancer   Cancer Paternal Aunt 75       breast cancer   Cancer Maternal Grandmother 81       ovarian or uterine cancer   Cancer Paternal Grandmother  unknown primary    ROS: Review of Systems  Constitutional: Negative for weight loss.  Respiratory: Negative for shortness of breath.   Cardiovascular: Negative for chest pain.  Endo/Heme/Allergies:       Negative for polyphagia    PHYSICAL EXAM: Pt in no acute distress  RECENT LABS AND TESTS: BMET    Component Value Date/Time   NA 140 11/04/2017 1424   NA 141 06/24/2017 1236   NA 140 11/04/2016 1500   K 3.7 11/04/2017 1424   K 3.8 11/04/2016 1500   CL 106 11/04/2017 1424   CO2 25 11/04/2017 1424   CO2 27 11/04/2016 1500   GLUCOSE 86 11/04/2017 1424   GLUCOSE 88 11/04/2016 1500   BUN 20 11/04/2017 1424   BUN 15 06/24/2017 1236   BUN 17.6 11/04/2016 1500   CREATININE 0.94 11/04/2017 1424   CREATININE 1.1 11/04/2016 1500   CALCIUM 10.2 11/04/2017 1424   CALCIUM 9.6 11/04/2016 1500   GFRNONAA >60 11/04/2017 1424   GFRAA >60 11/04/2017 1424   Lab Results  Component Value Date   HGBA1C 5.1 12/16/2017   HGBA1C 5.3 06/24/2017   Lab Results  Component Value Date   INSULIN 13.5 12/16/2017   INSULIN 6.2 06/24/2017   CBC    Component Value Date/Time   WBC 5.2 11/04/2017 1424   RBC 4.81 11/04/2017 1424   HGB 13.4 11/04/2017 1424   HGB 13.8 06/24/2017 1236   HGB  13.3 11/04/2016 1500   HCT 41.1 11/04/2017 1424   HCT 43.5 06/24/2017 1236   HCT 40.6 11/04/2016 1500   PLT 193 11/04/2017 1424   PLT 208 11/04/2016 1500   MCV 85.4 11/04/2017 1424   MCV 86 06/24/2017 1236   MCV 84.4 11/04/2016 1500   MCH 27.9 11/04/2017 1424   MCHC 32.6 11/04/2017 1424   RDW 13.0 11/04/2017 1424   RDW 14.8 06/24/2017 1236   RDW 13.9 11/04/2016 1500   LYMPHSABS 2.0 11/04/2017 1424   LYMPHSABS 2.5 06/24/2017 1236   LYMPHSABS 2.0 11/04/2016 1500   MONOABS 0.3 11/04/2017 1424   MONOABS 0.3 11/04/2016 1500   EOSABS 0.1 11/04/2017 1424   EOSABS 0.1 06/24/2017 1236   BASOSABS 0.0 11/04/2017 1424   BASOSABS 0.0 06/24/2017 1236   BASOSABS 0.0 11/04/2016 1500   Iron/TIBC/Ferritin/ %Sat No results found for: IRON, TIBC, FERRITIN, IRONPCTSAT Lipid Panel     Component Value Date/Time   CHOL 128 06/24/2017 1236   TRIG 80 06/24/2017 1236   HDL 57 06/24/2017 1236   LDLCALC 55 06/24/2017 1236   Hepatic Function Panel     Component Value Date/Time   PROT 7.8 11/04/2017 1424   PROT 7.4 06/24/2017 1236   PROT 7.5 11/04/2016 1500   ALBUMIN 3.9 11/04/2017 1424   ALBUMIN 4.4 06/24/2017 1236   ALBUMIN 3.8 11/04/2016 1500   AST 14 (L) 11/04/2017 1424   AST 20 11/04/2016 1500   ALT 10 11/04/2017 1424   ALT 15 11/04/2016 1500   ALKPHOS 67 11/04/2017 1424   ALKPHOS 87 11/04/2016 1500   BILITOT 0.4 11/04/2017 1424   BILITOT 0.3 06/24/2017 1236   BILITOT 0.31 11/04/2016 1500      Component Value Date/Time   TSH 2.030 06/24/2017 1236   TSH 0.129 (L) 08/28/2014 1620     Ref. Range 12/16/2017 08:51  Vitamin D, 25-Hydroxy Latest Ref Range: 30.0 - 100.0 ng/mL 59.8    I, Doreene Nest, am acting as Location manager for [employer], FNP-C  I have reviewed the above documentation  for accuracy and completeness, and I agree with the above.  - Sheryl Saintil, FNP-C.

## 2018-10-07 ENCOUNTER — Ambulatory Visit
Admission: RE | Admit: 2018-10-07 | Discharge: 2018-10-07 | Disposition: A | Discharge status: Home / Self Care | Payer: 59 | Source: Ambulatory Visit | Attending: Obstetrics and Gynecology | Admitting: Obstetrics and Gynecology

## 2018-10-07 ENCOUNTER — Other Ambulatory Visit: Payer: Self-pay

## 2018-10-07 DIAGNOSIS — Z9889 Other specified postprocedural states: Secondary | ICD-10-CM

## 2018-10-11 ENCOUNTER — Other Ambulatory Visit (INDEPENDENT_AMBULATORY_CARE_PROVIDER_SITE_OTHER): Payer: Self-pay | Admitting: Physician Assistant

## 2018-10-11 DIAGNOSIS — I10 Essential (primary) hypertension: Secondary | ICD-10-CM

## 2018-10-18 ENCOUNTER — Encounter (INDEPENDENT_AMBULATORY_CARE_PROVIDER_SITE_OTHER): Payer: Self-pay | Admitting: Family Medicine

## 2018-10-18 ENCOUNTER — Other Ambulatory Visit: Payer: Self-pay

## 2018-10-18 ENCOUNTER — Telehealth (INDEPENDENT_AMBULATORY_CARE_PROVIDER_SITE_OTHER): Payer: 59 | Admitting: Family Medicine

## 2018-10-18 DIAGNOSIS — G4733 Obstructive sleep apnea (adult) (pediatric): Secondary | ICD-10-CM | POA: Diagnosis not present

## 2018-10-18 DIAGNOSIS — I10 Essential (primary) hypertension: Secondary | ICD-10-CM | POA: Diagnosis not present

## 2018-10-18 DIAGNOSIS — Z6841 Body Mass Index (BMI) 40.0 and over, adult: Secondary | ICD-10-CM | POA: Diagnosis not present

## 2018-10-19 NOTE — Progress Notes (Signed)
Office: [phone]  /  Fax: [phone] TeleHealth Visit:  Jennifer Frazier has verbally consented to this TeleHealth visit today. The patient is located at home, the provider is located at the [facility] office. The participants in this visit include the listed provider and patient and any and all parties involved. The visit was conducted today via WebEx.  HPI:   Chief Complaint: OBESITY Jennifer Frazier is here to discuss her progress with her obesity treatment plan. She is on the keep a food journal with 1200 calories and 90 grams of protein daily plan and she is following her eating plan approximately 90 to 100 % of the time. She states she is walking 15 to 20 minutes daily and she is doing resistance exercise 5 days per week ([employer]). [name] is struggling to plan well. She has been tempted recently by comfort foods but she has resisted for the most part. She has rare polyphagia. We were unable to weigh the patient today for this TeleHealth visit. She feels as if she has maintained weight since her last visit. She has lost 46 lbs since starting treatment with [name].  Hypertension Jennifer Frazier is a 43 y.o. female with hypertension. Her blood pressure is well controlled at 133/87 today at home. Jennifer Frazier denies chest pain or shortness of breath on exertion. She is working weight loss to help control her blood pressure with the goal of decreasing her risk of heart attack and stroke.   OSA (obstructive sleep apnea) Jennifer Frazier has a diagnosis of obstructive sleep apnea and she admits fatigue. Jennifer Frazier is not sleeping well. She is up several times at night to urinate.  ASSESSMENT AND PLAN:  Essential hypertension  OSA (obstructive sleep apnea)  Class 3 severe obesity with serious comorbidity and body mass index (BMI) of 40.0 to 44.9 in adult, unspecified obesity type ([employer])  PLAN:  Hypertension We discussed sodium restriction, working on healthy weight loss, and a regular  exercise program as the means to achieve improved blood pressure control. Jennifer Frazier agreed with this plan and agreed to follow up as directed. We will continue to monitor her blood pressure as well as her progress with the above lifestyle modifications. Jennifer Frazier will continue Norvasc and she will watch for signs of hypotension as she continues her lifestyle modifications.  OSA (obstructive sleep apnea) Jennifer Frazier will follow up with neurology about CPAP. Good sleep hygiene was discussed with patient today.  Obesity Jennifer Frazier is currently in the action stage of change. As such, her goal is to continue with weight loss efforts She has agreed to keep a food journal with 1400 calories and 90+ grams of protein daily Jennifer Frazier has been instructed to work up to a goal of 150 minutes of combined cardio and strengthening exercise per week for weight loss and overall health benefits. We discussed the following Behavioral Modification Strategies today: planning for success, increase H2O intake and keep a strict food journal Jennifer Frazier will try to eat 100 less calories daily to break the plateau.  Jennifer Frazier has agreed to follow up with our clinic in 2 weeks. She was informed of the importance of frequent follow up visits to maximize her success with intensive lifestyle modifications for her multiple health conditions.  ALLERGIES: Allergies  Allergen Reactions  . Gadolinium      Desc: NAUSEA WITH MRI CONTRAST-NO VOMITING   . Tramadol Hives and Nausea Only    MEDICATIONS: Current Outpatient Medications on File Prior to Visit  Medication Sig Dispense  Refill  . amLODipine (NORVASC) 10 MG tablet Take 1 tablet (10 mg total) by mouth daily. 30 tablet 0  . calcium carbonate (TUMS - DOSED IN MG ELEMENTAL CALCIUM) 500 MG chewable tablet Chew 1 tablet by mouth daily as needed for indigestion or heartburn.    . diphenhydrAMINE (BENADRYL) 25 MG tablet Take 25 mg by mouth every 6 (six) hours as needed.    [name] oxybutynin (DITROPAN-XL) 5 MG 24  hr tablet     . tamoxifen (NOLVADEX) 20 MG tablet TAKE 1 TABLET BY MOUTH ONCE DAILY 30 tablet 3   No current facility-administered medications on file prior to visit.     PAST MEDICAL HISTORY: Past Medical History:  Diagnosis Date  . Anemia   . Anxiety   . Cancer ([place])    Breast  . Contact lens/glasses fitting    wears contacts or glasses  . Depression   . Fibroids    uterine fibroids  . Hypertension   . Hypothyroidism   . Obesity   . Personal history of chemotherapy   . Personal history of radiation therapy   . Radiation 04/04/14-05/21/14   Right upper-outer breast  . Sleep apnea    has a cpap-does not always use it  . Thyroid disease     PAST SURGICAL HISTORY: Past Surgical History:  Procedure Laterality Date  . BREAST LUMPECTOMY Right 08/03/2013  . BREAST SURGERY     lumpectomy 7/14  . EYE SURGERY  2013   retinal tear-lazer-both  . PORT A CATH REVISION N/A 12/19/2013   Procedure: REPOSITION PORT;  Surgeon: [name], MD;  Location: [place];  Service: General;  Laterality: N/A;  . PORT-A-CATH REMOVAL Right 08/08/2014   Procedure: REMOVAL PORT-A-CATH;  Surgeon: [name], MD;  Location: [place];  Service: General;  Laterality: Right;  . PORTACATH PLACEMENT Right 09/29/2013   Procedure: INSERTION PORT-A-CATH WITH ULTRA SOUND;  Surgeon: [name], MD;  Location: [place];  Service: General;  Laterality: Right;  . [place]  2012   urerine ablasion  . WISDOM TOOTH EXTRACTION      SOCIAL HISTORY: Social History   Tobacco Use  . Smoking status: Never Smoker  . Smokeless tobacco: Never Used  Substance Use Topics  . Alcohol use: No  . Drug use: No    FAMILY HISTORY: Family History  Problem Relation Age of Onset  . Cancer Father 51       colon  . High blood pressure Mother   . Stroke Mother   . Anxiety disorder Mother   . Sleep apnea Mother   . Obesity Mother   . Cancer Maternal Aunt 50        breast cancer  . Cancer Paternal Aunt 52       breast cancer  . Cancer Maternal Grandmother 45       ovarian or uterine cancer  . Cancer Paternal Grandmother        unknown primary    ROS: Review of Systems  Constitutional: Positive for malaise/fatigue. Negative for weight loss.  Respiratory: Negative for shortness of breath (on exertion).   Cardiovascular: Negative for chest pain.  Endo/Heme/Allergies:       Positive for polyphagia  Psychiatric/Behavioral: The patient has insomnia.     PHYSICAL EXAM: Pt in no acute distress  RECENT LABS AND TESTS: BMET    Component Value Date/Time   NA 140 11/04/2017 1424   NA 141 06/24/2017 1236   NA 140  11/04/2016 1500   K 3.7 11/04/2017 1424   K 3.8 11/04/2016 1500   CL 106 11/04/2017 1424   CO2 25 11/04/2017 1424   CO2 27 11/04/2016 1500   GLUCOSE 86 11/04/2017 1424   GLUCOSE 88 11/04/2016 1500   BUN 20 11/04/2017 1424   BUN 15 06/24/2017 1236   BUN 17.6 11/04/2016 1500   CREATININE 0.94 11/04/2017 1424   CREATININE 1.1 11/04/2016 1500   CALCIUM 10.2 11/04/2017 1424   CALCIUM 9.6 11/04/2016 1500   GFRNONAA >60 11/04/2017 1424   GFRAA >60 11/04/2017 1424   Lab Results  Component Value Date   HGBA1C 5.1 12/16/2017   HGBA1C 5.3 06/24/2017   Lab Results  Component Value Date   INSULIN 13.5 12/16/2017   INSULIN 6.2 06/24/2017   CBC    Component Value Date/Time   WBC 5.2 11/04/2017 1424   RBC 4.81 11/04/2017 1424   HGB 13.4 11/04/2017 1424   HGB 13.8 06/24/2017 1236   HGB 13.3 11/04/2016 1500   HCT 41.1 11/04/2017 1424   HCT 43.5 06/24/2017 1236   HCT 40.6 11/04/2016 1500   PLT 193 11/04/2017 1424   PLT 208 11/04/2016 1500   MCV 85.4 11/04/2017 1424   MCV 86 06/24/2017 1236   MCV 84.4 11/04/2016 1500   MCH 27.9 11/04/2017 1424   MCHC 32.6 11/04/2017 1424   RDW 13.0 11/04/2017 1424   RDW 14.8 06/24/2017 1236   RDW 13.9 11/04/2016 1500   LYMPHSABS 2.0 11/04/2017 1424   LYMPHSABS 2.5 06/24/2017 1236    LYMPHSABS 2.0 11/04/2016 1500   MONOABS 0.3 11/04/2017 1424   MONOABS 0.3 11/04/2016 1500   EOSABS 0.1 11/04/2017 1424   EOSABS 0.1 06/24/2017 1236   BASOSABS 0.0 11/04/2017 1424   BASOSABS 0.0 06/24/2017 1236   BASOSABS 0.0 11/04/2016 1500   Iron/TIBC/Ferritin/ %Sat No results found for: IRON, TIBC, FERRITIN, IRONPCTSAT Lipid Panel     Component Value Date/Time   CHOL 128 06/24/2017 1236   TRIG 80 06/24/2017 1236   HDL 57 06/24/2017 1236   LDLCALC 55 06/24/2017 1236   Hepatic Function Panel     Component Value Date/Time   PROT 7.8 11/04/2017 1424   PROT 7.4 06/24/2017 1236   PROT 7.5 11/04/2016 1500   ALBUMIN 3.9 11/04/2017 1424   ALBUMIN 4.4 06/24/2017 1236   ALBUMIN 3.8 11/04/2016 1500   AST 14 (L) 11/04/2017 1424   AST 20 11/04/2016 1500   ALT 10 11/04/2017 1424   ALT 15 11/04/2016 1500   ALKPHOS 67 11/04/2017 1424   ALKPHOS 87 11/04/2016 1500   BILITOT 0.4 11/04/2017 1424   BILITOT 0.3 06/24/2017 1236   BILITOT 0.31 11/04/2016 1500      Component Value Date/Time   TSH 2.030 06/24/2017 1236   TSH 0.129 (L) 08/28/2014 1620     Ref. Range 12/16/2017 08:51  Vitamin D, 25-Hydroxy Latest Ref Range: 30.0 - 100.0 ng/mL 59.8    I, Doreene Nest, am acting as Location manager for [employer], FNP-C  I have reviewed the above documentation for accuracy and completeness, and I agree with the above.  - Lilyahna Sirmon, FNP-C.

## 2018-10-24 ENCOUNTER — Other Ambulatory Visit: Payer: Self-pay | Admitting: Oncology

## 2018-11-01 ENCOUNTER — Encounter (INDEPENDENT_AMBULATORY_CARE_PROVIDER_SITE_OTHER): Payer: Self-pay | Admitting: Family Medicine

## 2018-11-01 ENCOUNTER — Ambulatory Visit (INDEPENDENT_AMBULATORY_CARE_PROVIDER_SITE_OTHER): Payer: 59 | Admitting: Family Medicine

## 2018-11-01 ENCOUNTER — Other Ambulatory Visit: Payer: Self-pay

## 2018-11-01 DIAGNOSIS — I10 Essential (primary) hypertension: Secondary | ICD-10-CM | POA: Diagnosis not present

## 2018-11-01 DIAGNOSIS — E8881 Metabolic syndrome: Secondary | ICD-10-CM | POA: Diagnosis not present

## 2018-11-01 DIAGNOSIS — Z6836 Body mass index (BMI) 36.0-36.9, adult: Secondary | ICD-10-CM

## 2018-11-01 DIAGNOSIS — E88819 Insulin resistance, unspecified: Secondary | ICD-10-CM

## 2018-11-01 MED ORDER — AMLODIPINE BESYLATE 10 MG PO TABS: 10.0000 mg | ORAL_TABLET | Freq: Every day | ORAL | 0 refills | Status: DC

## 2018-11-02 NOTE — Progress Notes (Signed)
Office: [phone]  /  Fax: [phone] TeleHealth Visit:  Jennifer Frazier has verbally consented to this TeleHealth visit today. The patient is located at home, the provider is located at the [employer] [facility] office. The participants in this visit include the listed provider and patient and any and all parties involved. The visit was conducted today via telephone. Jennifer Frazier was unable to use realtime audiovisual technology today (unable to connect with WebEx) and the telehealth visit was conducted via telephone.  HPI:   Chief Complaint: OBESITY Jennifer Frazier is here to discuss her progress with her obesity treatment plan. She is on the keep a food journal with 1400 calories and 90 grams of protein daily plan and is following her eating plan approximately 100 % of the time. She states she is walking 20 minutes 3 times per week. Jennifer Frazier reports that she has lost 2 pounds. She has followed the plan extremely well. She has not been as consistent with exercise. We were unable to weigh the patient today for this TeleHealth visit. She feels as if she has lost weight since her last visit (weight 227 lbs 11/01/18). She has lost 46 lbs since starting treatment with [name].  Hypertension Jennifer Frazier is a 43 y.o. female with hypertension. Jennifer Frazier blood pressure is well controlled on Norvasc 10 mg daily. Her blood pressure this morning at home was 129/81. Jennifer Frazier denies chest pain or shortness of breath on exertion. She is working weight loss to help control her blood pressure with the goal of decreasing her risk of heart attack and stroke.   Insulin Resistance Jennifer Frazier has a diagnosis of insulin resistance based on her elevated fasting insulin level >5. Although Jennifer Frazier's blood glucose readings are still under good control, insulin resistance puts her at greater risk of metabolic syndrome and diabetes. Jennifer Frazier is not on Metformin and she continues to work on diet and exercise to decrease risk of diabetes. Jennifer Frazier  denies polyphagia.  ASSESSMENT AND PLAN:  Essential hypertension - Plan: amLODipine (NORVASC) 10 MG tablet  Insulin resistance  Class 2 severe obesity with serious comorbidity and body mass index (BMI) of 36.0 to 36.9 in adult, unspecified obesity type ([place])  PLAN:  Hypertension We discussed sodium restriction, working on healthy weight loss, and a regular exercise program as the means to achieve improved blood pressure control. Deann agreed with this plan and agreed to follow up as directed. We will continue to monitor her blood pressure as well as her progress with the above lifestyle modifications. She agrees to continue Norvasc 10 mg daily #30 with no refills and she will watch for signs of hypotension as she continues her lifestyle modifications.  Insulin Resistance Jennifer Frazier will continue to work on weight loss, exercise, and decreasing simple carbohydrates in her diet to help decrease the risk of diabetes. She was informed that eating too many simple carbohydrates or too many calories at one sitting increases the likelihood of GI side effects. Jennifer Frazier will continue with the meal plan and follow up with [name] as directed to monitor her progress.  Obesity Jennifer Frazier is currently in the action stage of change. As such, her goal is to continue with weight loss efforts She has agreed to keep a food journal with 1300 to 1400 calories and 90 grams of protein daily Jennifer Frazier will continue walking 20 minutes, 3 times per week for weight loss and overall health benefits. We discussed the following Behavioral Modification Strategies today: planning for success, keeping healthy foods in  the home, work on meal planning and easy cooking plans  Handouts were sent to patient via MyChart: Recipe book and chicken recipes  Jennifer Frazier has agreed to follow up with our clinic in 2 weeks. She was informed of the importance of frequent follow up visits to maximize her success with intensive lifestyle modifications for her  multiple health conditions.  ALLERGIES: Allergies  Allergen Reactions   Gadolinium      Desc: NAUSEA WITH MRI CONTRAST-NO VOMITING    Tramadol Hives and Nausea Only    MEDICATIONS: Current Outpatient Medications on File Prior to Visit  Medication Sig Dispense Refill   calcium carbonate (TUMS - DOSED IN MG ELEMENTAL CALCIUM) 500 MG chewable tablet Chew 1 tablet by mouth daily as needed for indigestion or heartburn.     tamoxifen (NOLVADEX) 20 MG tablet TAKE ONE TABLET BY MOUTH ONCE DAILY. 30 tablet 0   diphenhydrAMINE (BENADRYL) 25 MG tablet Take 25 mg by mouth every 6 (six) hours as needed.     oxybutynin (DITROPAN-XL) 5 MG 24 hr tablet      No current facility-administered medications on file prior to visit.     PAST MEDICAL HISTORY: Past Medical History:  Diagnosis Date   Anemia    Anxiety    Cancer ([place])    Breast   Contact lens/glasses fitting    wears contacts or glasses   Depression    Fibroids    uterine fibroids   Hypertension    Hypothyroidism    Obesity    Personal history of chemotherapy    Personal history of radiation therapy    Radiation 04/04/14-05/21/14   Right upper-outer breast   Sleep apnea    has a cpap-does not always use it   Thyroid disease     PAST SURGICAL HISTORY: Past Surgical History:  Procedure Laterality Date   BREAST LUMPECTOMY Right 08/03/2013   BREAST SURGERY     lumpectomy 7/14   EYE SURGERY  2013   retinal tear-lazer-both   PORT A CATH REVISION N/A 12/19/2013   Procedure: REPOSITION PORT;  Surgeon: [name], MD;  Location: [place];  Service: General;  Laterality: N/A;   PORT-A-CATH REMOVAL Right 08/08/2014   Procedure: REMOVAL PORT-A-CATH;  Surgeon: [name], MD;  Location: [place];  Service: General;  Laterality: Right;   PORTACATH PLACEMENT Right 09/29/2013   Procedure: INSERTION PORT-A-CATH WITH ULTRA SOUND;  Surgeon: [name], MD;  Location: [place];  Service: General;  Laterality: Right;   [place]  2012   urerine ablasion   WISDOM TOOTH EXTRACTION      SOCIAL HISTORY: Social History   Tobacco Use   Smoking status: Never Smoker   Smokeless tobacco: Never Used  Substance Use Topics   Alcohol use: No   Drug use: No    FAMILY HISTORY: Family History  Problem Relation Age of Onset   Cancer Father 79       colon   High blood pressure Mother    Stroke Mother    Anxiety disorder Mother    Sleep apnea Mother    Obesity Mother    Cancer Maternal Aunt 70       breast cancer   Cancer Paternal Aunt 50       breast cancer   Cancer Maternal Grandmother 53       ovarian or uterine cancer   Cancer Paternal Grandmother        unknown primary  ROS: Review of Systems  Constitutional: Positive for weight loss.  Respiratory: Negative for shortness of breath (on exertion).   Cardiovascular: Negative for chest pain.  Endo/Heme/Allergies:       Negative for polyphagia    PHYSICAL EXAM: Pt in no acute distress  RECENT LABS AND TESTS: BMET    Component Value Date/Time   NA 140 11/04/2017 1424   NA 141 06/24/2017 1236   NA 140 11/04/2016 1500   K 3.7 11/04/2017 1424   K 3.8 11/04/2016 1500   CL 106 11/04/2017 1424   CO2 25 11/04/2017 1424   CO2 27 11/04/2016 1500   GLUCOSE 86 11/04/2017 1424   GLUCOSE 88 11/04/2016 1500   BUN 20 11/04/2017 1424   BUN 15 06/24/2017 1236   BUN 17.6 11/04/2016 1500   CREATININE 0.94 11/04/2017 1424   CREATININE 1.1 11/04/2016 1500   CALCIUM 10.2 11/04/2017 1424   CALCIUM 9.6 11/04/2016 1500   GFRNONAA >60 11/04/2017 1424   GFRAA >60 11/04/2017 1424   Lab Results  Component Value Date   HGBA1C 5.1 12/16/2017   HGBA1C 5.3 06/24/2017   Lab Results  Component Value Date   INSULIN 13.5 12/16/2017   INSULIN 6.2 06/24/2017   CBC    Component Value Date/Time   WBC 5.2 11/04/2017 1424   RBC 4.81 11/04/2017 1424   HGB 13.4 11/04/2017 1424   HGB  13.8 06/24/2017 1236   HGB 13.3 11/04/2016 1500   HCT 41.1 11/04/2017 1424   HCT 43.5 06/24/2017 1236   HCT 40.6 11/04/2016 1500   PLT 193 11/04/2017 1424   PLT 208 11/04/2016 1500   MCV 85.4 11/04/2017 1424   MCV 86 06/24/2017 1236   MCV 84.4 11/04/2016 1500   MCH 27.9 11/04/2017 1424   MCHC 32.6 11/04/2017 1424   RDW 13.0 11/04/2017 1424   RDW 14.8 06/24/2017 1236   RDW 13.9 11/04/2016 1500   LYMPHSABS 2.0 11/04/2017 1424   LYMPHSABS 2.5 06/24/2017 1236   LYMPHSABS 2.0 11/04/2016 1500   MONOABS 0.3 11/04/2017 1424   MONOABS 0.3 11/04/2016 1500   EOSABS 0.1 11/04/2017 1424   EOSABS 0.1 06/24/2017 1236   BASOSABS 0.0 11/04/2017 1424   BASOSABS 0.0 06/24/2017 1236   BASOSABS 0.0 11/04/2016 1500   Iron/TIBC/Ferritin/ %Sat No results found for: IRON, TIBC, FERRITIN, IRONPCTSAT Lipid Panel     Component Value Date/Time   CHOL 128 06/24/2017 1236   TRIG 80 06/24/2017 1236   HDL 57 06/24/2017 1236   LDLCALC 55 06/24/2017 1236   Hepatic Function Panel     Component Value Date/Time   PROT 7.8 11/04/2017 1424   PROT 7.4 06/24/2017 1236   PROT 7.5 11/04/2016 1500   ALBUMIN 3.9 11/04/2017 1424   ALBUMIN 4.4 06/24/2017 1236   ALBUMIN 3.8 11/04/2016 1500   AST 14 (L) 11/04/2017 1424   AST 20 11/04/2016 1500   ALT 10 11/04/2017 1424   ALT 15 11/04/2016 1500   ALKPHOS 67 11/04/2017 1424   ALKPHOS 87 11/04/2016 1500   BILITOT 0.4 11/04/2017 1424   BILITOT 0.3 06/24/2017 1236   BILITOT 0.31 11/04/2016 1500      Component Value Date/Time   TSH 2.030 06/24/2017 1236   TSH 0.129 (L) 08/28/2014 1620     Ref. Range 12/16/2017 08:51  Vitamin D, 25-Hydroxy Latest Ref Range: 30.0 - 100.0 ng/mL 59.8    I, Doreene Nest, am acting as Location manager for [employer], FNP-C  I have reviewed the above documentation for accuracy and  completeness, and I agree with the above.  - Denishia Citro, FNP-C.

## 2018-11-04 ENCOUNTER — Other Ambulatory Visit (INDEPENDENT_AMBULATORY_CARE_PROVIDER_SITE_OTHER): Payer: Self-pay | Admitting: Physician Assistant

## 2018-11-04 DIAGNOSIS — I10 Essential (primary) hypertension: Secondary | ICD-10-CM

## 2018-11-10 ENCOUNTER — Other Ambulatory Visit: Payer: Self-pay

## 2018-11-10 ENCOUNTER — Inpatient Hospital Stay: Payer: 59 | Attending: Oncology

## 2018-11-10 ENCOUNTER — Inpatient Hospital Stay (HOSPITAL_BASED_OUTPATIENT_CLINIC_OR_DEPARTMENT_OTHER): Payer: 59 | Admitting: Oncology

## 2018-11-10 VITALS — BP 128/81 | HR 68 | Temp 98.3°F | Resp 18 | Wt 227.2 lb

## 2018-11-10 DIAGNOSIS — Z79810 Long term (current) use of selective estrogen receptor modulators (SERMs): Secondary | ICD-10-CM | POA: Diagnosis not present

## 2018-11-10 DIAGNOSIS — Z803 Family history of malignant neoplasm of breast: Secondary | ICD-10-CM | POA: Insufficient documentation

## 2018-11-10 DIAGNOSIS — Z923 Personal history of irradiation: Secondary | ICD-10-CM | POA: Insufficient documentation

## 2018-11-10 DIAGNOSIS — Z170 Estrogen receptor positive status [ER+]: Secondary | ICD-10-CM | POA: Insufficient documentation

## 2018-11-10 DIAGNOSIS — G4700 Insomnia, unspecified: Secondary | ICD-10-CM | POA: Diagnosis not present

## 2018-11-10 DIAGNOSIS — Z9221 Personal history of antineoplastic chemotherapy: Secondary | ICD-10-CM | POA: Diagnosis not present

## 2018-11-10 DIAGNOSIS — C50411 Malignant neoplasm of upper-outer quadrant of right female breast: Secondary | ICD-10-CM | POA: Insufficient documentation

## 2018-11-10 DIAGNOSIS — D0511 Intraductal carcinoma in situ of right breast: Secondary | ICD-10-CM

## 2018-11-10 DIAGNOSIS — D0510 Intraductal carcinoma in situ of unspecified breast: Secondary | ICD-10-CM | POA: Diagnosis not present

## 2018-11-10 LAB — CBC WITH DIFFERENTIAL/PLATELET
Abs Immature Granulocytes: 0 10*3/uL (ref 0.00–0.07)
Basophils Absolute: 0 10*3/uL (ref 0.0–0.1)
Basophils Relative: 0 %
Eosinophils Absolute: 0.1 10*3/uL (ref 0.0–0.5)
Eosinophils Relative: 2 %
HCT: 41.1 % (ref 36.0–46.0)
Hemoglobin: 13.7 g/dL (ref 12.0–15.0)
Immature Granulocytes: 0 %
Lymphocytes Relative: 34 %
Lymphs Abs: 2 10*3/uL (ref 0.7–4.0)
MCH: 28.3 pg (ref 26.0–34.0)
MCHC: 33.3 g/dL (ref 30.0–36.0)
MCV: 84.9 fL (ref 80.0–100.0)
Monocytes Absolute: 0.3 10*3/uL (ref 0.1–1.0)
Monocytes Relative: 4 %
Neutro Abs: 3.5 10*3/uL (ref 1.7–7.7)
Neutrophils Relative %: 60 %
Platelets: 196 10*3/uL (ref 150–400)
RBC: 4.84 MIL/uL (ref 3.87–5.11)
RDW: 13.1 % (ref 11.5–15.5)
WBC: 5.9 10*3/uL (ref 4.0–10.5)
nRBC: 0 % (ref 0.0–0.2)

## 2018-11-10 LAB — COMPREHENSIVE METABOLIC PANEL
ALT: 14 U/L (ref 0–44)
AST: 20 U/L (ref 15–41)
Albumin: 4.1 g/dL (ref 3.5–5.0)
Alkaline Phosphatase: 75 U/L (ref 38–126)
Anion gap: 10 (ref 5–15)
BUN: 18 mg/dL (ref 6–20)
CO2: 27 mmol/L (ref 22–32)
Calcium: 10.2 mg/dL (ref 8.9–10.3)
Chloride: 104 mmol/L (ref 98–111)
Creatinine, Ser: 0.98 mg/dL (ref 0.44–1.00)
GFR calc Af Amer: >60 mL/min (ref >60)
GFR calc non Af Amer: >60 mL/min (ref >60)
Glucose, Bld: 80 mg/dL (ref 70–99)
Potassium: 3.5 mmol/L (ref 3.5–5.1)
Sodium: 141 mmol/L (ref 135–145)
Total Bilirubin: 0.4 mg/dL (ref 0.3–1.2)
Total Protein: 7.9 g/dL (ref 6.5–8.1)

## 2018-11-10 MED ORDER — TAMOXIFEN CITRATE 20 MG PO TABS: 20.0000 mg | ORAL_TABLET | Freq: Every day | ORAL | 4 refills | Status: DC

## 2018-11-10 NOTE — Progress Notes (Signed)
[place]  [phone] [phone]     ID: Jennifer Frazier DOB: 1975-11-23  MR#: [id number]  KWI#:[id number]  Patient Care Team: Sharilyn Sites, MD as PCP - General (Family Medicine) Ieasha Boerema, Virgie Dad, MD as Consulting Physician (Oncology) Thea Silversmith, MD as Consulting Physician (Radiation Oncology) [name], MD as Consulting Physician (General Surgery) Hinda Lenis (Dermatology) OTHER MD: Legrand Como Altheimer MD  CHIEF COMPLAINT: Estrogen receptor positive breast cancer  CURRENT TREATMENT: Tamoxifen   BREAST CANCER HISTORY: From the original intake note:  The patient had screening mammography at [place] showing calcifications in the right breast, and was referred to the breast Center 07/06/2013 for additional views. Right diagnostic mammography confirmed a group of amorphous calcifications in the upper outer right breast measuring 1.2 cm.  Biopsy of this area 07/17/2013 showed ([id number]) ductal carcinoma in situ, high-grade, estrogen receptor 100% positive, and progesterone receptor 96% positive, both with strong staining intensity.  The patient was then referred to surgery and on 07/25/2013 underwent bilateral breast MRI. This showed a breast density category CEA. In the right breast there was an area of clumped nodular enhancement measuring 3 cm. There was also a 1.5 cm circumscribed oval mass in the medial portion of the right breast consistent with a fibroadenoma. The left breast was unremarkable, and there were no abnormal appearing lymph nodes.  On 08/08/2013 the patient underwent right lumpectomy and sentinel lymph node sampling. The pathology ([id number] was (showed, in addition to ductal carcinoma in situ, invasive ductal carcinoma, measuring 0.7 cm, grade 3, estrogen receptor 86% positive, progesterone receptor 89% positive, both with strong staining intensity, with an MIB-1 of 62%, and no HER-2 amplification.  Both sentinel lymph nodes were clear. Margins were ample.   The patient's subsequent history is as detailed below   INTERVAL HISTORY: Jennifer Frazier returns today for follow-up and treatment of her estrogen receptor positive breast cancer. She was last seen here on 11/04/2017.   She continues on tamoxifen.  Tolerates this with no significant side effects that she is aware of.  Since her last visit here, she underwent a digital diagnostic bilateral mammogram with tomography on 10/07/2018 showing: Breast Density Category B. There is no mammographic evidence of malignancy.    REVIEW OF SYSTEMS: Jennifer Frazier started a weight loss program about a year ago and she is lost 50 pounds.  She really looks like a different person.  She enjoys her work, and is trying to walk 3-4 times a week.  She still has problems with insomnia.  She tells me she had a sleep study and they did find sleep apnea.  She has been unwilling to start CPAP.  Aside from these issues a detailed review of systems today was stable.    PAST MEDICAL HISTORY: Past Medical History:  Diagnosis Date  . Anemia   . Anxiety   . Cancer ([place])    Breast  . Contact lens/glasses fitting    wears contacts or glasses  . Depression   . Fibroids    uterine fibroids  . Hypertension   . Hypothyroidism   . Obesity   . Personal history of chemotherapy   . Personal history of radiation therapy   . Radiation 04/04/14-05/21/14   Right upper-outer breast  . Sleep apnea    has a cpap-does not always use it  . Thyroid disease     PAST SURGICAL HISTORY: Past Surgical History:  Procedure Laterality Date  . BREAST LUMPECTOMY Right 08/03/2013  .  BREAST SURGERY     lumpectomy 7/14  . EYE SURGERY  2013   retinal tear-lazer-both  . PORT A CATH REVISION N/A 12/19/2013   Procedure: REPOSITION PORT;  Surgeon: [name], MD;  Location: [place];  Service: General;  Laterality: N/A;  . PORT-A-CATH REMOVAL Right 08/08/2014   Procedure:  REMOVAL PORT-A-CATH;  Surgeon: [name], MD;  Location: [facility];  Service: General;  Laterality: Right;  . PORTACATH PLACEMENT Right 09/29/2013   Procedure: INSERTION PORT-A-CATH WITH ULTRA SOUND;  Surgeon: [name], MD;  Location: [facility];  Service: General;  Laterality: Right;  . [place]  2012   urerine ablasion  . WISDOM TOOTH EXTRACTION      FAMILY HISTORY Family History  Problem Relation Age of Onset  . Cancer Father 26       colon  . High blood pressure Mother   . Stroke Mother   . Anxiety disorder Mother   . Sleep apnea Mother   . Obesity Mother   . Cancer Maternal Aunt 50       breast cancer  . Cancer Paternal Aunt 6       breast cancer  . Cancer Maternal Grandmother 51       ovarian or uterine cancer  . Cancer Paternal Grandmother        unknown primary   the patient's father died at the age of 24, been diagnosed with colon cancer at the age of 3. The patient's mother is living, currently 34 years old. The patient has one brother, no sisters. One of the patient's mother's sisters was diagnosed with breast cancer at the age of 28 in one of the patient's father's mother was diagnosed with breast cancer at the age of 2. There is no history of ovarian cancer in the family.  GYNECOLOGIC HISTORY:  Patient's last menstrual period was 09/03/2017. Menarche age 65, for the patient is GX P0. She was still having regular periods at the start of chemotherapy. She used oral contraceptives for approximately one year with no complications.  Her periods stopped with chemotherapy and have not resumed. She has been evaluated at the fertility center in [place] and they do not feel they would be able to stimulate production.  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  She is now working at uncovering evidence of medical fraud. 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 She finds the work challenging and interesting but also stressful. She is single and her mother lives with her.(The patient's mother is wheelchair-bound secondary to a stroke).    ADVANCED DIRECTIVES: Not in place; at the 08/24/2013 visit the patient was given the appropriate documents to come feel notarize associate may declare healthcare power of attorney.   HEALTH MAINTENANCE: Social History   Tobacco Use  . Smoking status: Never Smoker  . Smokeless tobacco: Never Used  Substance Use Topics  . Alcohol use: No  . Drug use: No     Colonoscopy:  PAP:  Bone density:  Lipid panel:  Allergies  Allergen Reactions  . Gadolinium      Desc: NAUSEA WITH MRI CONTRAST-NO VOMITING   . Tramadol Hives and Nausea Only    Current Outpatient Medications  Medication Sig Dispense Refill  . amLODipine (NORVASC) 10 MG tablet Take 1 tablet (10 mg total) by mouth daily. 30 tablet  0  . calcium carbonate (TUMS - DOSED IN MG ELEMENTAL CALCIUM) 500 MG chewable tablet Chew 1 tablet by mouth daily as needed for indigestion or heartburn.    . diphenhydrAMINE (BENADRYL) 25 MG tablet Take 25 mg by mouth every 6 (six) hours as needed.    [name] oxybutynin (DITROPAN-XL) 5 MG 24 hr tablet     . tamoxifen (NOLVADEX) 20 MG tablet Take 1 tablet (20 mg total) by mouth daily. 90 tablet 4   No current facility-administered medications for this visit.     OBJECTIVE: Middle-aged [place] American woman who appears stated age  42:   11/10/18 1445  BP: 128/81  Pulse: 68  Resp: 18  Temp: 98.3 F (36.8 C)  SpO2: 100%   Wt Readings from Last 3 Encounters:  11/10/18 227 lb 3.2 oz (103.1 kg)  03/28/18 248 lb (112.5 kg)  03/08/18 255 lb (115.7 kg)   Body mass index is 33.55 kg/m.    ECOG FS:1 - Symptomatic but completely ambulatory  Ocular: Sclerae unicteric, pupils round and equal Ear-nose-throat: Wearing a mask Lymphatic: No cervical or  supraclavicular adenopathy Lungs no rales or rhonchi Heart regular rate and rhythm Abd soft, nontender, positive bowel sounds MSK no focal spinal tenderness, no joint edema Neuro: non-focal, well-oriented, appropriate affect Breasts: The right breast is undergone lumpectomy and radiation.  There is no evidence of local recurrence.  The left breast is benign.  Both axilla are benign.   LAB RESULTS:  CMP     Component Value Date/Time   NA 141 11/10/2018 1434   NA 141 06/24/2017 1236   NA 140 11/04/2016 1500   K 3.5 11/10/2018 1434   K 3.8 11/04/2016 1500   CL 104 11/10/2018 1434   CO2 27 11/10/2018 1434   CO2 27 11/04/2016 1500   GLUCOSE 80 11/10/2018 1434   GLUCOSE 88 11/04/2016 1500   BUN 18 11/10/2018 1434   BUN 15 06/24/2017 1236   BUN 17.6 11/04/2016 1500   CREATININE 0.98 11/10/2018 1434   CREATININE 1.1 11/04/2016 1500   CALCIUM 10.2 11/10/2018 1434   CALCIUM 9.6 11/04/2016 1500   PROT 7.9 11/10/2018 1434   PROT 7.4 06/24/2017 1236   PROT 7.5 11/04/2016 1500   ALBUMIN 4.1 11/10/2018 1434   ALBUMIN 4.4 06/24/2017 1236   ALBUMIN 3.8 11/04/2016 1500   AST 20 11/10/2018 1434   AST 20 11/04/2016 1500   ALT 14 11/10/2018 1434   ALT 15 11/04/2016 1500   ALKPHOS 75 11/10/2018 1434   ALKPHOS 87 11/04/2016 1500   BILITOT 0.4 11/10/2018 1434   BILITOT 0.3 06/24/2017 1236   BILITOT 0.31 11/04/2016 1500   GFRNONAA >60 11/10/2018 1434   GFRAA >60 11/10/2018 1434    I No results found for: SPEP  Lab Results  Component Value Date   WBC 5.9 11/10/2018   NEUTROABS 3.5 11/10/2018   HGB 13.7 11/10/2018   HCT 41.1 11/10/2018   MCV 84.9 11/10/2018   PLT 196 11/10/2018      Chemistry      Component Value Date/Time   NA 141 11/10/2018 1434   NA 141 06/24/2017 1236   NA 140 11/04/2016 1500   K 3.5 11/10/2018 1434   K 3.8 11/04/2016 1500   CL 104 11/10/2018 1434   CO2 27 11/10/2018 1434   CO2 27 11/04/2016 1500   BUN 18 11/10/2018 1434   BUN 15 06/24/2017 1236    BUN 17.6 11/04/2016 1500   CREATININE 0.98 11/10/2018 1434  CREATININE 1.1 11/04/2016 1500      Component Value Date/Time   CALCIUM 10.2 11/10/2018 1434   CALCIUM 9.6 11/04/2016 1500   ALKPHOS 75 11/10/2018 1434   ALKPHOS 87 11/04/2016 1500   AST 20 11/10/2018 1434   AST 20 11/04/2016 1500   ALT 14 11/10/2018 1434   ALT 15 11/04/2016 1500   BILITOT 0.4 11/10/2018 1434   BILITOT 0.3 06/24/2017 1236   BILITOT 0.31 11/04/2016 1500       No results found for: LABCA2  No components found for: LABCA125  No results for input(s): INR in the last 168 hours.  Urinalysis    Component Value Date/Time   COLORURINE YELLOW 02/02/2014 0845   APPEARANCEUR CLEAR 02/02/2014 0845   LABSPEC 1.009 02/02/2014 0845   PHURINE 6.0 02/02/2014 0845   GLUCOSEU NEGATIVE 02/02/2014 0845   HGBUR NEGATIVE 02/02/2014 0845   BILIRUBINUR NEGATIVE 02/02/2014 0845   KETONESUR NEGATIVE 02/02/2014 0845   PROTEINUR NEGATIVE 02/02/2014 0845   UROBILINOGEN 0.2 02/02/2014 0845   NITRITE NEGATIVE 02/02/2014 0845   LEUKOCYTESUR TRACE (A) 02/02/2014 0845    STUDIES: No results found.    ASSESSMENT: 43 y.o. BRCA negative [place] woman status post right breast Upper outer quadrant biopsy 07/17/2013 for high-grade ductal carcinoma in situ, estrogen and progesterone receptor positive  (1) status post right breast upper outer quadrant lumpectomy and sentinel lymph node sampling 08/08/2013 for a pT1b pN0, stage IA invasive ductal carcinoma, grade 3, estrogen and progesterone receptor positive, with an MIB-1 of 62% and no HER-2 amplification  (2) genetics sent 08/02/2013 was normal but did identify a variant of uncertain significance called BRCA2, [id number].  (3) Oncotype score of 23 predicts a risk of outside the breast recurrence within 10 years of 15% if the patient's only systemic treatment is tamoxifen for 5 years. Adjuvant chemotherapy was recommended  (4) doxorubicin and cyclophosphamide in dose dense  fashion x4 completed 11/16/2013, followed by paclitaxel weekly x12 completed 03/08/2014  (5) adjuvant radiation 3/9/206-05/21/2014:  Right breast / 45 Gray @ 1.8 [place] Cables per fraction x 25 fractions Right breast boost / 16 Gray at [employer] per fraction x 8 fractions  (6) tamoxifen started 06/20/2014   PLAN: Jennifer Frazier is now a little over 5 years out from definitive surgery for breast cancer with no evidence of disease recurrence.  This is very favorable.  She is tolerating tamoxifen well and the plan is to continue that a minimum of 5 years.  I have encouraged her dieting, which she has been very successful to date, and also to continue and extend her exercise program.  She knows to call for any issues that may develop before her next visit which will be in 1 year.  Tavone Caesar, Virgie Dad, MD  11/10/18 5:14 PM Medical Oncology and Hematology [facility] [place], [place] [address] Tel. [phone]    Fax. [phone]  I, Jacqualyn Posey am acting as a Education administrator for Chauncey Cruel, MD.   I, Lurline Del MD, have reviewed the above documentation for accuracy and completeness, and I agree with the above.

## 2018-11-11 ENCOUNTER — Telehealth: Payer: Self-pay | Admitting: Oncology

## 2018-11-11 NOTE — Telephone Encounter (Signed)
I talk with patient regarding schedule  

## 2018-11-16 ENCOUNTER — Ambulatory Visit (INDEPENDENT_AMBULATORY_CARE_PROVIDER_SITE_OTHER): Payer: 59 | Admitting: Family Medicine

## 2018-11-16 ENCOUNTER — Other Ambulatory Visit: Payer: Self-pay

## 2018-11-16 ENCOUNTER — Encounter (INDEPENDENT_AMBULATORY_CARE_PROVIDER_SITE_OTHER): Payer: Self-pay | Admitting: Family Medicine

## 2018-11-16 DIAGNOSIS — Z6836 Body mass index (BMI) 36.0-36.9, adult: Secondary | ICD-10-CM

## 2018-11-16 DIAGNOSIS — I10 Essential (primary) hypertension: Secondary | ICD-10-CM

## 2018-11-17 ENCOUNTER — Encounter: Payer: Self-pay | Admitting: Oncology

## 2018-11-20 NOTE — Progress Notes (Signed)
Office: [phone]  /  Fax: [phone] TeleHealth Visit:  Jennifer Frazier has verbally consented to this TeleHealth visit today. The patient is located at home, the provider is located at the [facility] office. The participants in this visit include the listed provider and patient . The visit was conducted today via Webex.   HPI:   Chief Complaint: OBESITY Jennifer Frazier is here to discuss her progress with her obesity treatment plan. She is on the  keep a food journal with 1300-1400 calories and 90g of protein daily and is following her eating plan approximately 95 % of the time. She states she is exercising by walking for 20 minutes 3 times per week. Jennifer Frazier thinks she has lost 1 lb since her last office visit. She is doing an excellent job on the plan and meeting protein goals most of the time. She is having some cravings but is not giving in to them.  We were unable to weigh the patient today for this TeleHealth visit. She feels as if she has lost weight since her last visit. She has lost 46 lbs since starting treatment with [name].  Hypertension Jennifer Frazier is a 43 y.o. female with hypertension.  Jennifer Frazier denies chest pain or shortness of breath on exertion. She is working weight loss to help control her blood pressure with the goal of decreasing her risk of heart attack and stroke. Jennifer Frazier blood pressure is currently controlled on Norvasc.  BP Readings from Last 3 Encounters:  11/10/18 128/81  03/28/18 134/85  03/08/18 132/70    ASSESSMENT AND PLAN:  Essential hypertension  Class 2 severe obesity with serious comorbidity and body mass index (BMI) of 36.0 to 36.9 in adult, unspecified obesity type ([place])  PLAN: Hypertension We discussed sodium restriction, working on healthy weight loss, and a regular exercise program as the means to achieve improved blood pressure control. Daily agreed with this plan and agreed to follow up as directed. We will continue to monitor her  blood pressure as well as her progress with the above lifestyle modifications. She will continue her medications as prescribed and will watch for signs of hypotension as she continues her lifestyle modifications.  Obesity Jennifer Frazier is currently in the action stage of change. As such, her goal is to continue with weight loss efforts She has agreed to keep a food journal with 1300-1400 calories and 90g of protein daily.  Jennifer Frazier has been instructed to work up to a goal of 150 minutes of combined cardio and strengthening exercise per week for weight loss and overall health benefits. We discussed the following Behavioral Modification Strategies today: increasing lean protein intake and planning for success.  We will repeat IC at next visit.   Jennifer Frazier has agreed to follow up with our clinic in 2-3 weeks. She was informed of the importance of frequent follow up visits to maximize her success with intensive lifestyle modifications for her multiple health conditions.  ALLERGIES: Allergies  Allergen Reactions  . Gadolinium      Desc: NAUSEA WITH MRI CONTRAST-NO VOMITING   . Tramadol Hives and Nausea Only    MEDICATIONS: Current Outpatient Medications on File Prior to Visit  Medication Sig Dispense Refill  . amLODipine (NORVASC) 10 MG tablet Take 1 tablet (10 mg total) by mouth daily. 30 tablet 0  . calcium carbonate (TUMS - DOSED IN MG ELEMENTAL CALCIUM) 500 MG chewable tablet Chew 1 tablet by mouth daily as needed for indigestion or heartburn.    [name]  diphenhydrAMINE (BENADRYL) 25 MG tablet Take 25 mg by mouth every 6 (six) hours as needed.    [name] oxybutynin (DITROPAN-XL) 5 MG 24 hr tablet     . tamoxifen (NOLVADEX) 20 MG tablet Take 1 tablet (20 mg total) by mouth daily. 90 tablet 4   No current facility-administered medications on file prior to visit.     PAST MEDICAL HISTORY: Past Medical History:  Diagnosis Date  . Anemia   . Anxiety   . Cancer ([place])    Breast  . Contact lens/glasses fitting     wears contacts or glasses  . Depression   . Fibroids    uterine fibroids  . Hypertension   . Hypothyroidism   . Obesity   . Personal history of chemotherapy   . Personal history of radiation therapy   . Radiation 04/04/14-05/21/14   Right upper-outer breast  . Sleep apnea    has a cpap-does not always use it  . Thyroid disease     PAST SURGICAL HISTORY: Past Surgical History:  Procedure Laterality Date  . BREAST LUMPECTOMY Right 08/03/2013  . BREAST SURGERY     lumpectomy 7/14  . EYE SURGERY  2013   retinal tear-lazer-both  . PORT A CATH REVISION N/A 12/19/2013   Procedure: REPOSITION PORT;  Surgeon: [name], MD;  Location: [place];  Service: General;  Laterality: N/A;  . PORT-A-CATH REMOVAL Right 08/08/2014   Procedure: REMOVAL PORT-A-CATH;  Surgeon: [name], MD;  Location: [place];  Service: General;  Laterality: Right;  . PORTACATH PLACEMENT Right 09/29/2013   Procedure: INSERTION PORT-A-CATH WITH ULTRA SOUND;  Surgeon: [name], MD;  Location: [place];  Service: General;  Laterality: Right;  . [place]  2012   urerine ablasion  . WISDOM TOOTH EXTRACTION      SOCIAL HISTORY: Social History   Tobacco Use  . Smoking status: Never Smoker  . Smokeless tobacco: Never Used  Substance Use Topics  . Alcohol use: No  . Drug use: No    FAMILY HISTORY: Family History  Problem Relation Age of Onset  . Cancer Father 54       colon  . High blood pressure Mother   . Stroke Mother   . Anxiety disorder Mother   . Sleep apnea Mother   . Obesity Mother   . Cancer Maternal Aunt 50       breast cancer  . Cancer Paternal Aunt 10       breast cancer  . Cancer Maternal Grandmother 85       ovarian or uterine cancer  . Cancer Paternal Grandmother        unknown primary    ROS: Review of Systems  Respiratory: Negative for shortness of breath.   Cardiovascular: Negative for chest pain.    PHYSICAL EXAM:  Pt in no acute distress  RECENT LABS AND TESTS: BMET    Component Value Date/Time   NA 141 11/10/2018 1434   NA 141 06/24/2017 1236   NA 140 11/04/2016 1500   K 3.5 11/10/2018 1434   K 3.8 11/04/2016 1500   CL 104 11/10/2018 1434   CO2 27 11/10/2018 1434   CO2 27 11/04/2016 1500   GLUCOSE 80 11/10/2018 1434   GLUCOSE 88 11/04/2016 1500   BUN 18 11/10/2018 1434   BUN 15 06/24/2017 1236   BUN 17.6 11/04/2016 1500   CREATININE 0.98 11/10/2018 1434   CREATININE 1.1 11/04/2016 1500   CALCIUM 10.2  11/10/2018 1434   CALCIUM 9.6 11/04/2016 1500   GFRNONAA >60 11/10/2018 1434   GFRAA >60 11/10/2018 1434   Lab Results  Component Value Date   HGBA1C 5.1 12/16/2017   HGBA1C 5.3 06/24/2017   Lab Results  Component Value Date   INSULIN 13.5 12/16/2017   INSULIN 6.2 06/24/2017   CBC    Component Value Date/Time   WBC 5.9 11/10/2018 1434   RBC 4.84 11/10/2018 1434   HGB 13.7 11/10/2018 1434   HGB 13.8 06/24/2017 1236   HGB 13.3 11/04/2016 1500   HCT 41.1 11/10/2018 1434   HCT 43.5 06/24/2017 1236   HCT 40.6 11/04/2016 1500   PLT 196 11/10/2018 1434   PLT 208 11/04/2016 1500   MCV 84.9 11/10/2018 1434   MCV 86 06/24/2017 1236   MCV 84.4 11/04/2016 1500   MCH 28.3 11/10/2018 1434   MCHC 33.3 11/10/2018 1434   RDW 13.1 11/10/2018 1434   RDW 14.8 06/24/2017 1236   RDW 13.9 11/04/2016 1500   LYMPHSABS 2.0 11/10/2018 1434   LYMPHSABS 2.5 06/24/2017 1236   LYMPHSABS 2.0 11/04/2016 1500   MONOABS 0.3 11/10/2018 1434   MONOABS 0.3 11/04/2016 1500   EOSABS 0.1 11/10/2018 1434   EOSABS 0.1 06/24/2017 1236   BASOSABS 0.0 11/10/2018 1434   BASOSABS 0.0 06/24/2017 1236   BASOSABS 0.0 11/04/2016 1500   Iron/TIBC/Ferritin/ %Sat No results found for: IRON, TIBC, FERRITIN, IRONPCTSAT Lipid Panel     Component Value Date/Time   CHOL 128 06/24/2017 1236   TRIG 80 06/24/2017 1236   HDL 57 06/24/2017 1236   LDLCALC 55 06/24/2017 1236   Hepatic Function Panel     Component  Value Date/Time   PROT 7.9 11/10/2018 1434   PROT 7.4 06/24/2017 1236   PROT 7.5 11/04/2016 1500   ALBUMIN 4.1 11/10/2018 1434   ALBUMIN 4.4 06/24/2017 1236   ALBUMIN 3.8 11/04/2016 1500   AST 20 11/10/2018 1434   AST 20 11/04/2016 1500   ALT 14 11/10/2018 1434   ALT 15 11/04/2016 1500   ALKPHOS 75 11/10/2018 1434   ALKPHOS 87 11/04/2016 1500   BILITOT 0.4 11/10/2018 1434   BILITOT 0.3 06/24/2017 1236   BILITOT 0.31 11/04/2016 1500      Component Value Date/Time   TSH 2.030 06/24/2017 1236   TSH 0.129 (L) 08/28/2014 1620      I, Renee Ramus, am acting as Location manager for [employer], FNP .  I have reviewed the above documentation for accuracy and completeness, and I agree with the above.  - Avelino Herren, FNP-C.

## 2018-11-21 ENCOUNTER — Encounter (INDEPENDENT_AMBULATORY_CARE_PROVIDER_SITE_OTHER): Payer: Self-pay | Admitting: Family Medicine

## 2018-12-05 ENCOUNTER — Other Ambulatory Visit (INDEPENDENT_AMBULATORY_CARE_PROVIDER_SITE_OTHER): Payer: Self-pay | Admitting: Physician Assistant

## 2018-12-05 DIAGNOSIS — I10 Essential (primary) hypertension: Secondary | ICD-10-CM

## 2018-12-06 ENCOUNTER — Encounter (INDEPENDENT_AMBULATORY_CARE_PROVIDER_SITE_OTHER): Payer: Self-pay

## 2018-12-08 ENCOUNTER — Encounter: Payer: Self-pay | Admitting: Family Medicine

## 2018-12-08 ENCOUNTER — Encounter (INDEPENDENT_AMBULATORY_CARE_PROVIDER_SITE_OTHER): Payer: Self-pay | Admitting: Family Medicine

## 2018-12-08 ENCOUNTER — Other Ambulatory Visit: Payer: Self-pay

## 2018-12-08 ENCOUNTER — Ambulatory Visit (INDEPENDENT_AMBULATORY_CARE_PROVIDER_SITE_OTHER): Payer: 59 | Admitting: Family Medicine

## 2018-12-08 VITALS — BP 120/82 | HR 77 | Temp 98.0°F | Ht 69.0 in | Wt 221.0 lb

## 2018-12-08 DIAGNOSIS — I10 Essential (primary) hypertension: Secondary | ICD-10-CM | POA: Diagnosis not present

## 2018-12-08 DIAGNOSIS — E8881 Metabolic syndrome: Secondary | ICD-10-CM

## 2018-12-08 DIAGNOSIS — Z6832 Body mass index (BMI) 32.0-32.9, adult: Secondary | ICD-10-CM

## 2018-12-08 DIAGNOSIS — Z9189 Other specified personal risk factors, not elsewhere classified: Secondary | ICD-10-CM | POA: Diagnosis not present

## 2018-12-08 DIAGNOSIS — G4733 Obstructive sleep apnea (adult) (pediatric): Secondary | ICD-10-CM | POA: Diagnosis not present

## 2018-12-08 DIAGNOSIS — E669 Obesity, unspecified: Secondary | ICD-10-CM

## 2018-12-08 MED ORDER — AMLODIPINE BESYLATE 10 MG PO TABS: 10.0000 mg | ORAL_TABLET | Freq: Every day | ORAL | 0 refills | Status: DC

## 2018-12-09 ENCOUNTER — Encounter: Payer: Self-pay | Admitting: Oncology

## 2018-12-09 LAB — VITAMIN D 25 HYDROXY (VIT D DEFICIENCY, FRACTURES): Vit D, 25-Hydroxy: 66.6 ng/mL (ref 30.0–100.0)

## 2018-12-09 LAB — INSULIN, RANDOM: INSULIN: 11 u[IU]/mL (ref 2.6–24.9)

## 2018-12-09 LAB — HEMOGLOBIN A1C
Est. average glucose Bld gHb Est-mCnc: 97 mg/dL
Hgb A1c MFr Bld: 5 % (ref 4.8–5.6)

## 2018-12-12 NOTE — Progress Notes (Signed)
Office: [phone]  /  Fax: [phone]   HPI:   Chief Complaint: OBESITY Jennifer Frazier is here to discuss her progress with her obesity treatment plan. She is keeping a food journal with 1400 calories and 90 grams of protein daily and is following her eating plan approximately 100 % of the time. She states she is walking 30 minutes 5 times per week. Jennifer Frazier denies excessive hunger, but she does report cravings at times. She has been skipping meals some and she notes a headache. She has stayed on the plan very well during the pandemic. Today is her first in office visit since 03/28/18 and she is down 27 lbs.  Her weight is 221 lb (100.2 kg) today and has had a weight loss of 27 pounds since her last in-office visit. She has lost 73 lbs since starting treatment with [name].  Insulin Resistance Jennifer Frazier has a diagnosis of insulin resistance based on her elevated fasting insulin level >5. Although Jennifer Frazier's blood glucose readings are still under good control, insulin resistance puts her at greater risk of metabolic syndrome and diabetes. She is not taking metformin currently and continues to work on diet and exercise to decrease risk of diabetes. Jennifer Frazier denies polyphagia.  At risk for diabetes Jennifer Frazier is at higher than average risk for developing diabetes due to her obesity and insulin resistance. She currently denies polyuria or polydipsia.  OSA (obstructive sleep apnea) Jennifer Frazier is diagnosed with sleep apnea and needs CPAP, but she has not pursued getting a machine. Jennifer Frazier denies chest pain or shortness of breath.  Hypertension Jennifer Frazier is a 43 y.o. female with hypertension. She is well controlled with Norvasc. Jennifer Frazier denies chest pain or shortness of breath on exertion. She is working weight loss to help control her blood pressure with the goal of decreasing her risk of heart attack and stroke.  BP Readings from Last 3 Encounters:  12/08/18 120/82  11/10/18 128/81  03/28/18 134/85    ASSESSMENT  AND PLAN:  Insulin resistance - Plan: HgB A1c, Insulin, random, Vitamin D (25 hydroxy)  OSA (obstructive sleep apnea)  Essential hypertension - Plan: amLODipine (NORVASC) 10 MG tablet  At risk for diabetes mellitus  Class 1 obesity with serious comorbidity and body mass index (BMI) of 32.0 to 32.9 in adult, unspecified obesity type  PLAN:  Insulin Resistance Jennifer Frazier will continue to work on weight loss, exercise, and decreasing simple carbohydrates in her diet to help decrease the risk of diabetes. She was informed that eating too many simple carbohydrates or too many calories at one sitting increases the likelihood of GI side effects. We will check A1c and fasting insulin today and Jennifer Frazier will follow up with [name] as directed to monitor her progress.  Diabetes risk counseling Jennifer Frazier was given extended (15 minutes) diabetes prevention counseling today. She is 43 y.o. female and has risk factors for diabetes including obesity and insulin resistance. We discussed intensive lifestyle modifications today with an emphasis on weight loss as well as increasing exercise and decreasing simple carbohydrates in her diet.  OSA (obstructive sleep apnea) Indirect calorimetry was checked today and showed that her RMR went from 1284 to 2008.  Hypertension We discussed sodium restriction, working on healthy weight loss, and a regular exercise program as the means to achieve improved blood pressure control. Jennifer Frazier agreed with this plan and agreed to follow up as directed. We will continue to monitor her blood pressure as well as her progress with the above lifestyle modifications. Jennifer Frazier  agrees to continue Norvasc 10 mg daily #30 with no refills and she will watch for signs of hypotension as she continues her lifestyle modifications.  Obesity Jennifer Frazier is currently in the action stage of change. As such, her goal is to continue with weight loss efforts She has agreed to keep a food journal with 1500 to 1600 calories  and 90 to 100 grams of protein daily Jennifer Frazier has been instructed to work up to a goal of 150 minutes of combined cardio and strengthening exercise per week for weight loss and overall health benefits. We discussed the following Behavioral Modification Strategies today: planning for success Recipe for egg muffins was given to patient today.  Jennifer Frazier has agreed to follow up with our clinic in 3 weeks. She was informed of the importance of frequent follow up visits to maximize her success with intensive lifestyle modifications for her multiple health conditions.  ALLERGIES: Allergies  Allergen Reactions  . Gadolinium      Desc: NAUSEA WITH MRI CONTRAST-NO VOMITING   . Tramadol Hives and Nausea Only    MEDICATIONS: Current Outpatient Medications on File Prior to Visit  Medication Sig Dispense Refill  . calcium carbonate (TUMS - DOSED IN MG ELEMENTAL CALCIUM) 500 MG chewable tablet Chew 1 tablet by mouth daily as needed for indigestion or heartburn.    . diphenhydrAMINE (BENADRYL) 25 MG tablet Take 25 mg by mouth every 6 (six) hours as needed.    [name] oxybutynin (DITROPAN-XL) 5 MG 24 hr tablet     . tamoxifen (NOLVADEX) 20 MG tablet Take 1 tablet (20 mg total) by mouth daily. 90 tablet 4   No current facility-administered medications on file prior to visit.     PAST MEDICAL HISTORY: Past Medical History:  Diagnosis Date  . Anemia   . Anxiety   . Cancer ([place])    Breast  . Contact lens/glasses fitting    wears contacts or glasses  . Depression   . Fibroids    uterine fibroids  . Hypertension   . Hypothyroidism   . Obesity   . Personal history of chemotherapy   . Personal history of radiation therapy   . Radiation 04/04/14-05/21/14   Right upper-outer breast  . Sleep apnea    has a cpap-does not always use it  . Thyroid disease     PAST SURGICAL HISTORY: Past Surgical History:  Procedure Laterality Date  . BREAST LUMPECTOMY Right 08/03/2013  . BREAST SURGERY     lumpectomy 7/14   . EYE SURGERY  2013   retinal tear-lazer-both  . PORT A CATH REVISION N/A 12/19/2013   Procedure: REPOSITION PORT;  Surgeon: [name], MD;  Location: [place];  Service: General;  Laterality: N/A;  . PORT-A-CATH REMOVAL Right 08/08/2014   Procedure: REMOVAL PORT-A-CATH;  Surgeon: [name], MD;  Location: [place];  Service: General;  Laterality: Right;  . PORTACATH PLACEMENT Right 09/29/2013   Procedure: INSERTION PORT-A-CATH WITH ULTRA SOUND;  Surgeon: [name], MD;  Location: [place];  Service: General;  Laterality: Right;  . [place]  2012   urerine ablasion  . WISDOM TOOTH EXTRACTION      SOCIAL HISTORY: Social History   Tobacco Use  . Smoking status: Never Smoker  . Smokeless tobacco: Never Used  Substance Use Topics  . Alcohol use: No  . Drug use: No    FAMILY HISTORY: Family History  Problem Relation Age of Onset  . Cancer Father 26  colon  . High blood pressure Mother   . Stroke Mother   . Anxiety disorder Mother   . Sleep apnea Mother   . Obesity Mother   . Cancer Maternal Aunt 50       breast cancer  . Cancer Paternal Aunt 6       breast cancer  . Cancer Maternal Grandmother 70       ovarian or uterine cancer  . Cancer Paternal Grandmother        unknown primary    ROS: Review of Systems  Constitutional: Positive for weight loss.  Respiratory: Negative for shortness of breath.   Cardiovascular: Negative for chest pain.  Genitourinary: Negative for frequency.  Endo/Heme/Allergies: Negative for polydipsia.       Negative for polyphagia    PHYSICAL EXAM: Blood pressure 120/82, pulse 77, temperature 98 F (36.7 C), temperature source Oral, height 5\' 9"  (1.753 m), weight 221 lb (100.2 kg), last menstrual period 09/03/2017, SpO2 100 %. Body mass index is 32.64 kg/m. Physical Exam Vitals signs reviewed.  Constitutional:      Appearance: Normal appearance. She is well-developed. She is  obese.  Cardiovascular:     Rate and Rhythm: Normal rate.  Pulmonary:     Effort: Pulmonary effort is normal.  Musculoskeletal: Normal range of motion.  Skin:    General: Skin is warm and dry.  Neurological:     Mental Status: She is alert and oriented to person, place, and time.  Psychiatric:        Mood and Affect: Mood normal.        Behavior: Behavior normal.     RECENT LABS AND TESTS: BMET    Component Value Date/Time   NA 141 11/10/2018 1434   NA 141 06/24/2017 1236   NA 140 11/04/2016 1500   K 3.5 11/10/2018 1434   K 3.8 11/04/2016 1500   CL 104 11/10/2018 1434   CO2 27 11/10/2018 1434   CO2 27 11/04/2016 1500   GLUCOSE 80 11/10/2018 1434   GLUCOSE 88 11/04/2016 1500   BUN 18 11/10/2018 1434   BUN 15 06/24/2017 1236   BUN 17.6 11/04/2016 1500   CREATININE 0.98 11/10/2018 1434   CREATININE 1.1 11/04/2016 1500   CALCIUM 10.2 11/10/2018 1434   CALCIUM 9.6 11/04/2016 1500   GFRNONAA >60 11/10/2018 1434   GFRAA >60 11/10/2018 1434   Lab Results  Component Value Date   HGBA1C 5.0 12/08/2018   HGBA1C 5.1 12/16/2017   HGBA1C 5.3 06/24/2017   Lab Results  Component Value Date   INSULIN 11.0 12/08/2018   INSULIN 13.5 12/16/2017   INSULIN 6.2 06/24/2017   CBC    Component Value Date/Time   WBC 5.9 11/10/2018 1434   RBC 4.84 11/10/2018 1434   HGB 13.7 11/10/2018 1434   HGB 13.8 06/24/2017 1236   HGB 13.3 11/04/2016 1500   HCT 41.1 11/10/2018 1434   HCT 43.5 06/24/2017 1236   HCT 40.6 11/04/2016 1500   PLT 196 11/10/2018 1434   PLT 208 11/04/2016 1500   MCV 84.9 11/10/2018 1434   MCV 86 06/24/2017 1236   MCV 84.4 11/04/2016 1500   MCH 28.3 11/10/2018 1434   MCHC 33.3 11/10/2018 1434   RDW 13.1 11/10/2018 1434   RDW 14.8 06/24/2017 1236   RDW 13.9 11/04/2016 1500   LYMPHSABS 2.0 11/10/2018 1434   LYMPHSABS 2.5 06/24/2017 1236   LYMPHSABS 2.0 11/04/2016 1500   MONOABS 0.3 11/10/2018 1434   MONOABS 0.3 11/04/2016 1500  EOSABS 0.1 11/10/2018 1434    EOSABS 0.1 06/24/2017 1236   BASOSABS 0.0 11/10/2018 1434   BASOSABS 0.0 06/24/2017 1236   BASOSABS 0.0 11/04/2016 1500   Iron/TIBC/Ferritin/ %Sat No results found for: IRON, TIBC, FERRITIN, IRONPCTSAT Lipid Panel     Component Value Date/Time   CHOL 128 06/24/2017 1236   TRIG 80 06/24/2017 1236   HDL 57 06/24/2017 1236   LDLCALC 55 06/24/2017 1236   Hepatic Function Panel     Component Value Date/Time   PROT 7.9 11/10/2018 1434   PROT 7.4 06/24/2017 1236   PROT 7.5 11/04/2016 1500   ALBUMIN 4.1 11/10/2018 1434   ALBUMIN 4.4 06/24/2017 1236   ALBUMIN 3.8 11/04/2016 1500   AST 20 11/10/2018 1434   AST 20 11/04/2016 1500   ALT 14 11/10/2018 1434   ALT 15 11/04/2016 1500   ALKPHOS 75 11/10/2018 1434   ALKPHOS 87 11/04/2016 1500   BILITOT 0.4 11/10/2018 1434   BILITOT 0.3 06/24/2017 1236   BILITOT 0.31 11/04/2016 1500      Component Value Date/Time   TSH 2.030 06/24/2017 1236   TSH 0.129 (L) 08/28/2014 1620     Ref. Range 12/08/2018 09:58  Vitamin D, 25-Hydroxy Latest Ref Range: 30.0 - 100.0 ng/mL 66.6    OBESITY BEHAVIORAL INTERVENTION VISIT  Today's visit was # 30  Starting weight: 294 lbs Starting date: 06/24/2017 Today's weight : 221 lbs Today's date: 12/08/2018 Total lbs lost to date: 73    12/08/2018  Height 5\' 9"  (1.753 m)  Weight 221 lb (100.2 kg)  BMI (Calculated) 32.62  BLOOD PRESSURE - SYSTOLIC 123456  BLOOD PRESSURE - DIASTOLIC 82   Body Fat % [id number] %  Total Body Water (lbs) 87.2 lbs  RMR 2008    ASK: We discussed the diagnosis of obesity with Jennifer Frazier today and Jennifer Frazier agreed to give [name] permission to discuss obesity behavioral modification therapy today.  ASSESS: Lacandice has the diagnosis of obesity and her BMI today is 32.62 Oral is in the action stage of change   ADVISE: Meggen was educated on the multiple health risks of obesity as well as the benefit of weight loss to improve her health. She was advised of the need for long term  treatment and the importance of lifestyle modifications to improve her current health and to decrease her risk of future health problems.  AGREE: Multiple dietary modification options and treatment options were discussed and  Mahrosh agreed to follow the recommendations documented in the above note.  ARRANGE: Madylin was educated on the importance of frequent visits to treat obesity as outlined per CMS and USPSTF guidelines and agreed to schedule her next follow up appointment today.  I, Doreene Nest, am acting as transcriptionist for [employer], FNP-C  I have reviewed the above documentation for accuracy and completeness, and I agree with the above.  - Draven Natter, FNP-C.

## 2018-12-13 ENCOUNTER — Encounter (INDEPENDENT_AMBULATORY_CARE_PROVIDER_SITE_OTHER): Payer: Self-pay | Admitting: Family Medicine

## 2018-12-29 ENCOUNTER — Telehealth (INDEPENDENT_AMBULATORY_CARE_PROVIDER_SITE_OTHER): Payer: 59 | Admitting: Family Medicine

## 2018-12-29 ENCOUNTER — Encounter (INDEPENDENT_AMBULATORY_CARE_PROVIDER_SITE_OTHER): Payer: Self-pay | Admitting: Family Medicine

## 2018-12-29 ENCOUNTER — Other Ambulatory Visit: Payer: Self-pay

## 2018-12-29 DIAGNOSIS — Z6832 Body mass index (BMI) 32.0-32.9, adult: Secondary | ICD-10-CM | POA: Diagnosis not present

## 2018-12-29 DIAGNOSIS — E669 Obesity, unspecified: Secondary | ICD-10-CM

## 2018-12-29 DIAGNOSIS — E8881 Metabolic syndrome: Secondary | ICD-10-CM

## 2018-12-29 DIAGNOSIS — I10 Essential (primary) hypertension: Secondary | ICD-10-CM

## 2018-12-29 MED ORDER — AMLODIPINE BESYLATE 10 MG PO TABS: 10.0000 mg | ORAL_TABLET | Freq: Every day | ORAL | 0 refills | Status: DC

## 2019-01-02 NOTE — Progress Notes (Signed)
Office: [phone]  /  Fax: [phone] TeleHealth Visit:  Jennifer Frazier has verbally consented to this TeleHealth visit today. The patient is located at home, the provider is located at the [facility] office. The participants in this visit include the listed provider and patient and any and all parties involved. The visit was conducted today via WebEx.  HPI:   Chief Complaint: OBESITY Jennifer Frazier is here to discuss her progress with her obesity treatment plan. She is on the keep a food journal with 1500 to 1600 calories and 90 to 100 grams of protein daily and is following her eating plan approximately 95 % of the time. She states she is walking 30 minutes 3 to 4 times per week. Jennifer Frazier has lost 1 pound since her last visit. She is somewhat bored with the foods she is eating. Jennifer Frazier is making a smoothie with protein powder or greek yogurt for lunch. We were unable to weigh the patient today for this TeleHealth visit. She feels as if she has lost weight since her last visit (weight 223 lbs 12/29/18). She has lost 73 lbs since starting treatment with [name].  Insulin Resistance Jennifer Frazier has a diagnosis of insulin resistance based on her elevated fasting insulin level >5. Although Jaydalee's blood glucose readings are still under good control, insulin resistance puts her at greater risk of metabolic syndrome and diabetes. Ramaya is not on metformin and she continues to work on diet and exercise to decrease risk of diabetes. Jennifer Frazier denies polyphagia.  Hypertension Jennifer Frazier is a 43 y.o. female with hypertension. Jennifer Frazier blood pressure is well controlled Jennifer Frazier denies chest pain, shortness of breath on exertion or signs of hypotension. She is working weight loss to help control her blood pressure with the goal of decreasing her risk of heart attack and stroke. . BP Readings from Last 3 Encounters:  12/08/18 120/82  11/10/18 128/81  03/28/18 134/85     ASSESSMENT AND PLAN:  Insulin  resistance  Essential hypertension - Plan: amLODipine (NORVASC) 10 MG tablet  Class 1 obesity with serious comorbidity and body mass index (BMI) of 32.0 to 32.9 in adult, unspecified obesity type  PLAN:  Insulin Resistance Jennifer Frazier will continue to work on weight loss, exercise, and decreasing simple carbohydrates in her diet to help decrease the risk of diabetes. Jennifer Frazier will continue with the meal plan and follow up with [name] as directed to monitor her progress.  Hypertension Jennifer Frazier is working on healthy weight loss and exercise to improve blood pressure control. Jennifer Frazier agreed with this plan and agreed to follow up as directed. We will continue to monitor her blood pressure as well as her progress with the above lifestyle modifications. Jennifer Frazier agrees to continue Norvasc 10 mg daily #30 with no refills and she will watch for signs of hypotension as she continues her lifestyle modifications.  Obesity Jennifer Frazier is currently in the action stage of change. As such, her goal is to continue with weight loss efforts She has agreed to keep a food journal with 1500 to 1600 calories and 90 grams of protein daily Jennifer Frazier will continue walking for 30 minutes, 3 to 4 times per week for weight loss and overall health benefits. We discussed the following Behavioral Modification Strategies today: planning for success, better snacking choices and work on meal planning and easy cooking plans  Jennifer Frazier may make smoothie at lunch with protein.  Jennifer Frazier has agreed to follow up with our clinic in 2 weeks. She was  informed of the importance of frequent follow up visits to maximize her success with intensive lifestyle modifications for her multiple health conditions.  ALLERGIES: Allergies  Allergen Reactions  . Gadolinium      Desc: NAUSEA WITH MRI CONTRAST-NO VOMITING   . Tramadol Hives and Nausea Only    MEDICATIONS: Current Outpatient Medications on File Prior to Visit  Medication Sig Dispense Refill  . calcium carbonate  (TUMS - DOSED IN MG ELEMENTAL CALCIUM) 500 MG chewable tablet Chew 1 tablet by mouth daily as needed for indigestion or heartburn.    . diphenhydrAMINE (BENADRYL) 25 MG tablet Take 25 mg by mouth every 6 (six) hours as needed.    [name] oxybutynin (DITROPAN-XL) 5 MG 24 hr tablet     . tamoxifen (NOLVADEX) 20 MG tablet Take 1 tablet (20 mg total) by mouth daily. 90 tablet 4   No current facility-administered medications on file prior to visit.     PAST MEDICAL HISTORY: Past Medical History:  Diagnosis Date  . Anemia   . Anxiety   . Cancer ([place])    Breast  . Contact lens/glasses fitting    wears contacts or glasses  . Depression   . Fibroids    uterine fibroids  . Hypertension   . Hypothyroidism   . Obesity   . Personal history of chemotherapy   . Personal history of radiation therapy   . Radiation 04/04/14-05/21/14   Right upper-outer breast  . Sleep apnea    has a cpap-does not always use it  . Thyroid disease     PAST SURGICAL HISTORY: Past Surgical History:  Procedure Laterality Date  . BREAST LUMPECTOMY Right 08/03/2013  . BREAST SURGERY     lumpectomy 7/14  . EYE SURGERY  2013   retinal tear-lazer-both  . PORT A CATH REVISION N/A 12/19/2013   Procedure: REPOSITION PORT;  Surgeon: [name], MD;  Location: [facility];  Service: General;  Laterality: N/A;  . PORT-A-CATH REMOVAL Right 08/08/2014   Procedure: REMOVAL PORT-A-CATH;  Surgeon: [name], MD;  Location: [place];  Service: General;  Laterality: Right;  . PORTACATH PLACEMENT Right 09/29/2013   Procedure: INSERTION PORT-A-CATH WITH ULTRA SOUND;  Surgeon: [name], MD;  Location: [place];  Service: General;  Laterality: Right;  . [place]  2012   urerine ablasion  . WISDOM TOOTH EXTRACTION      SOCIAL HISTORY: Social History   Tobacco Use  . Smoking status: Never Smoker  . Smokeless tobacco: Never Used  Substance Use Topics  . Alcohol use: No  . Drug  use: No    FAMILY HISTORY: Family History  Problem Relation Age of Onset  . Cancer Father 80       colon  . High blood pressure Mother   . Stroke Mother   . Anxiety disorder Mother   . Sleep apnea Mother   . Obesity Mother   . Cancer Maternal Aunt 50       breast cancer  . Cancer Paternal Aunt 20       breast cancer  . Cancer Maternal Grandmother 3       ovarian or uterine cancer  . Cancer Paternal Grandmother        unknown primary    ROS: Review of Systems  Constitutional: Positive for weight loss.  Respiratory: Negative for shortness of breath (on exertion).   Cardiovascular: Negative for chest pain.  Endo/Heme/Allergies:       Negative for  polyphagia    PHYSICAL EXAM: Pt in no acute distress  RECENT LABS AND TESTS: BMET    Component Value Date/Time   NA 141 11/10/2018 1434   NA 141 06/24/2017 1236   NA 140 11/04/2016 1500   K 3.5 11/10/2018 1434   K 3.8 11/04/2016 1500   CL 104 11/10/2018 1434   CO2 27 11/10/2018 1434   CO2 27 11/04/2016 1500   GLUCOSE 80 11/10/2018 1434   GLUCOSE 88 11/04/2016 1500   BUN 18 11/10/2018 1434   BUN 15 06/24/2017 1236   BUN 17.6 11/04/2016 1500   CREATININE 0.98 11/10/2018 1434   CREATININE 1.1 11/04/2016 1500   CALCIUM 10.2 11/10/2018 1434   CALCIUM 9.6 11/04/2016 1500   GFRNONAA >60 11/10/2018 1434   GFRAA >60 11/10/2018 1434   Lab Results  Component Value Date   HGBA1C 5.0 12/08/2018   HGBA1C 5.1 12/16/2017   HGBA1C 5.3 06/24/2017   Lab Results  Component Value Date   INSULIN 11.0 12/08/2018   INSULIN 13.5 12/16/2017   INSULIN 6.2 06/24/2017   CBC    Component Value Date/Time   WBC 5.9 11/10/2018 1434   RBC 4.84 11/10/2018 1434   HGB 13.7 11/10/2018 1434   HGB 13.8 06/24/2017 1236   HGB 13.3 11/04/2016 1500   HCT 41.1 11/10/2018 1434   HCT 43.5 06/24/2017 1236   HCT 40.6 11/04/2016 1500   PLT 196 11/10/2018 1434   PLT 208 11/04/2016 1500   MCV 84.9 11/10/2018 1434   MCV 86 06/24/2017 1236    MCV 84.4 11/04/2016 1500   MCH 28.3 11/10/2018 1434   MCHC 33.3 11/10/2018 1434   RDW 13.1 11/10/2018 1434   RDW 14.8 06/24/2017 1236   RDW 13.9 11/04/2016 1500   LYMPHSABS 2.0 11/10/2018 1434   LYMPHSABS 2.5 06/24/2017 1236   LYMPHSABS 2.0 11/04/2016 1500   MONOABS 0.3 11/10/2018 1434   MONOABS 0.3 11/04/2016 1500   EOSABS 0.1 11/10/2018 1434   EOSABS 0.1 06/24/2017 1236   BASOSABS 0.0 11/10/2018 1434   BASOSABS 0.0 06/24/2017 1236   BASOSABS 0.0 11/04/2016 1500   Iron/TIBC/Ferritin/ %Sat No results found for: IRON, TIBC, FERRITIN, IRONPCTSAT Lipid Panel     Component Value Date/Time   CHOL 128 06/24/2017 1236   TRIG 80 06/24/2017 1236   HDL 57 06/24/2017 1236   LDLCALC 55 06/24/2017 1236   Hepatic Function Panel     Component Value Date/Time   PROT 7.9 11/10/2018 1434   PROT 7.4 06/24/2017 1236   PROT 7.5 11/04/2016 1500   ALBUMIN 4.1 11/10/2018 1434   ALBUMIN 4.4 06/24/2017 1236   ALBUMIN 3.8 11/04/2016 1500   AST 20 11/10/2018 1434   AST 20 11/04/2016 1500   ALT 14 11/10/2018 1434   ALT 15 11/04/2016 1500   ALKPHOS 75 11/10/2018 1434   ALKPHOS 87 11/04/2016 1500   BILITOT 0.4 11/10/2018 1434   BILITOT 0.3 06/24/2017 1236   BILITOT 0.31 11/04/2016 1500      Component Value Date/Time   TSH 2.030 06/24/2017 1236   TSH 0.129 (L) 08/28/2014 1620     Ref. Range 12/08/2018 09:58  Vitamin D, 25-Hydroxy Latest Ref Range: 30.0 - 100.0 ng/mL 66.6    I, Doreene Nest, am acting as Location manager for [employer], FNP-C.  I have reviewed the above documentation for accuracy and completeness, and I agree with the above.  - Jency Schnieders, FNP-C.

## 2019-01-12 ENCOUNTER — Encounter (INDEPENDENT_AMBULATORY_CARE_PROVIDER_SITE_OTHER): Payer: Self-pay | Admitting: Physician Assistant

## 2019-01-12 ENCOUNTER — Other Ambulatory Visit: Payer: Self-pay

## 2019-01-12 ENCOUNTER — Telehealth (INDEPENDENT_AMBULATORY_CARE_PROVIDER_SITE_OTHER): Payer: 59 | Admitting: Physician Assistant

## 2019-01-12 DIAGNOSIS — I10 Essential (primary) hypertension: Secondary | ICD-10-CM | POA: Diagnosis not present

## 2019-01-12 DIAGNOSIS — Z6832 Body mass index (BMI) 32.0-32.9, adult: Secondary | ICD-10-CM | POA: Diagnosis not present

## 2019-01-12 DIAGNOSIS — E8881 Metabolic syndrome: Secondary | ICD-10-CM

## 2019-01-12 DIAGNOSIS — E669 Obesity, unspecified: Secondary | ICD-10-CM | POA: Diagnosis not present

## 2019-01-17 NOTE — Progress Notes (Signed)
Office: [phone]  /  Fax: [phone] TeleHealth Visit:  Jennifer Frazier has verbally consented to this TeleHealth visit today. The patient is located at home, the provider is located at the [employer] [facility] office. The participants in this visit include the listed provider and patient and any and all parties involved. The visit was conducted today via WebEx.  HPI:  Chief Complaint: OBESITY Jennifer Frazier is here to discuss her progress with her obesity treatment plan. She is on the keep a food journal with 1500 to 1600 calories and 95 grams of protein daily plan and states she is following her eating plan approximately 80 % of the time. She states she is walking 30 minutes 3 times per week.  Jennifer Frazier's most recent weight is 224 pounds (01/12/19). She feels like she may have gained weight, due to not walking as much and going over her calories on a few days.  Hypertension Jennifer Frazier is a 43 y.o. female with hypertension. She is on Amlodipine. Mariell denies chest pain.   Insulin Resistance Jennifer Frazier has a diagnosis of insulin resistance and her last level was at 11.0 (12/08/18). She is not on medications currently. [name] denies polyphagia.    ASSESSMENT AND PLAN:  Essential hypertension  Insulin resistance  Class 1 obesity with serious comorbidity and body mass index (BMI) of 32.0 to 32.9 in adult, unspecified obesity type  PLAN:  Hypertension Jennifer Frazier is working on healthy weight loss and exercise to improve blood pressure control. We will watch for signs of hypotension as she continues her lifestyle modifications. Jennifer Frazier will continue with medications and weight loss and follow up as directed.  Insulin Resistance [name] will continue to work on weight loss, exercise, and decreasing simple carbohydrates to help decrease the risk of diabetes. [name] agreed to follow up with [name] as directed to closely monitor her progress.  Obesity [name] is currently in the action stage of change. As  such, her goal is to continue with weight loss efforts She has agreed to keep a food journal with 1500 to 1600 calories and 95 grams of protein  daily [name] has been instructed to work up to a goal of 150 minutes of combined cardio and strengthening exercise per week for weight loss and overall health benefits. We discussed the following Behavioral Modification Strategies today: planning for success and work on meal planning and easy cooking plans  Jennifer Frazier has agreed to follow up with our clinic in 4 weeks. She was informed of the importance of frequent follow up visits to maximize her success with intensive lifestyle modifications for her multiple health conditions.  I spent > than 50% of the 28 minute visit on counseling as documented in the note.    ALLERGIES: Allergies  Allergen Reactions  . Gadolinium      Desc: NAUSEA WITH MRI CONTRAST-NO VOMITING   . Tramadol Hives and Nausea Only    MEDICATIONS: Current Outpatient Medications on File Prior to Visit  Medication Sig Dispense Refill  . amLODipine (NORVASC) 10 MG tablet Take 1 tablet (10 mg total) by mouth daily. 30 tablet 0  . calcium carbonate (TUMS - DOSED IN MG ELEMENTAL CALCIUM) 500 MG chewable tablet Chew 1 tablet by mouth daily as needed for indigestion or heartburn.    . diphenhydrAMINE (BENADRYL) 25 MG tablet Take 25 mg by mouth every 6 (six) hours as needed.    Jennifer Frazier oxybutynin (DITROPAN-XL) 5 MG 24 hr tablet     . tamoxifen (NOLVADEX) 20 MG tablet  Take 1 tablet (20 mg total) by mouth daily. 90 tablet 4   No current facility-administered medications on file prior to visit.    PAST MEDICAL HISTORY: Past Medical History:  Diagnosis Date  . Anemia   . Anxiety   . Cancer ([place])    Breast  . Contact lens/glasses fitting    wears contacts or glasses  . Depression   . Fibroids    uterine fibroids  . Hypertension   . Hypothyroidism   . Obesity   . Personal history of chemotherapy   . Personal history of radiation  therapy   . Radiation 04/04/14-05/21/14   Right upper-outer breast  . Sleep apnea    has a cpap-does not always use it  . Thyroid disease     PAST SURGICAL HISTORY: Past Surgical History:  Procedure Laterality Date  . BREAST LUMPECTOMY Right 08/03/2013  . BREAST SURGERY     lumpectomy 7/14  . EYE SURGERY  2013   retinal tear-lazer-both  . PORT A CATH REVISION N/A 12/19/2013   Procedure: REPOSITION PORT;  Surgeon: Jennifer Luna, MD;  Location: [facility];  Service: General;  Laterality: N/A;  . PORT-A-CATH REMOVAL Right 08/08/2014   Procedure: REMOVAL PORT-A-CATH;  Surgeon: Jennifer Luna, MD;  Location: [facility];  Service: General;  Laterality: Right;  . PORTACATH PLACEMENT Right 09/29/2013   Procedure: INSERTION PORT-A-CATH WITH ULTRA SOUND;  Surgeon: Jennifer Luna, MD;  Location: [facility];  Service: General;  Laterality: Right;  . [place]  2012   urerine ablasion  . WISDOM TOOTH EXTRACTION      SOCIAL HISTORY: Social History   Tobacco Use  . Smoking status: Never Smoker  . Smokeless tobacco: Never Used  Substance Use Topics  . Alcohol use: No  . Drug use: No    FAMILY HISTORY: Family History  Problem Relation Age of Onset  . Cancer Father 65       colon  . High blood pressure Mother   . Stroke Mother   . Anxiety disorder Mother   . Sleep apnea Mother   . Obesity Mother   . Cancer Maternal Aunt 50       breast cancer  . Cancer Paternal Aunt 60       breast cancer  . Cancer Maternal Grandmother 5       ovarian or uterine cancer  . Cancer Paternal Grandmother        unknown primary    ROS: Review of Systems  Constitutional: Negative for weight loss.  Cardiovascular: Negative for chest pain.  Endo/Heme/Allergies:       Negative for polyphagia    PHYSICAL EXAM: Last menstrual period 09/03/2017. There is no height or weight on file to calculate BMI. Physical Exam Vitals reviewed.  Constitutional:       General: She is not in acute distress.    Appearance: Normal appearance. She is well-developed. She is obese.  Cardiovascular:     Rate and Rhythm: Normal rate.  Pulmonary:     Effort: Pulmonary effort is normal.  Musculoskeletal:        General: Normal range of motion.  Skin:    General: Skin is warm and dry.  Neurological:     Mental Status: She is alert and oriented to person, place, and time.  Psychiatric:        Mood and Affect: Mood normal.        Behavior: Behavior normal.     RECENT LABS  AND TESTS: BMET    Component Value Date/Time   NA 141 11/10/2018 1434   NA 141 06/24/2017 1236   NA 140 11/04/2016 1500   K 3.5 11/10/2018 1434   K 3.8 11/04/2016 1500   CL 104 11/10/2018 1434   CO2 27 11/10/2018 1434   CO2 27 11/04/2016 1500   GLUCOSE 80 11/10/2018 1434   GLUCOSE 88 11/04/2016 1500   BUN 18 11/10/2018 1434   BUN 15 06/24/2017 1236   BUN 17.6 11/04/2016 1500   CREATININE 0.98 11/10/2018 1434   CREATININE 1.1 11/04/2016 1500   CALCIUM 10.2 11/10/2018 1434   CALCIUM 9.6 11/04/2016 1500   GFRNONAA >60 11/10/2018 1434   GFRAA >60 11/10/2018 1434   Lab Results  Component Value Date   HGBA1C 5.0 12/08/2018   HGBA1C 5.1 12/16/2017   HGBA1C 5.3 06/24/2017   Lab Results  Component Value Date   INSULIN 11.0 12/08/2018   INSULIN 13.5 12/16/2017   INSULIN 6.2 06/24/2017   CBC    Component Value Date/Time   WBC 5.9 11/10/2018 1434   RBC 4.84 11/10/2018 1434   HGB 13.7 11/10/2018 1434   HGB 13.8 06/24/2017 1236   HGB 13.3 11/04/2016 1500   HCT 41.1 11/10/2018 1434   HCT 43.5 06/24/2017 1236   HCT 40.6 11/04/2016 1500   PLT 196 11/10/2018 1434   PLT 208 11/04/2016 1500   MCV 84.9 11/10/2018 1434   MCV 86 06/24/2017 1236   MCV 84.4 11/04/2016 1500   MCH 28.3 11/10/2018 1434   MCHC 33.3 11/10/2018 1434   RDW 13.1 11/10/2018 1434   RDW 14.8 06/24/2017 1236   RDW 13.9 11/04/2016 1500   LYMPHSABS 2.0 11/10/2018 1434   LYMPHSABS 2.5 06/24/2017 1236    LYMPHSABS 2.0 11/04/2016 1500   MONOABS 0.3 11/10/2018 1434   MONOABS 0.3 11/04/2016 1500   EOSABS 0.1 11/10/2018 1434   EOSABS 0.1 06/24/2017 1236   BASOSABS 0.0 11/10/2018 1434   BASOSABS 0.0 06/24/2017 1236   BASOSABS 0.0 11/04/2016 1500   Iron/TIBC/Ferritin/ %Sat No results found for: IRON, TIBC, FERRITIN, IRONPCTSAT Lipid Panel     Component Value Date/Time   CHOL 128 06/24/2017 1236   TRIG 80 06/24/2017 1236   HDL 57 06/24/2017 1236   LDLCALC 55 06/24/2017 1236   Hepatic Function Panel     Component Value Date/Time   PROT 7.9 11/10/2018 1434   PROT 7.4 06/24/2017 1236   PROT 7.5 11/04/2016 1500   ALBUMIN 4.1 11/10/2018 1434   ALBUMIN 4.4 06/24/2017 1236   ALBUMIN 3.8 11/04/2016 1500   AST 20 11/10/2018 1434   AST 20 11/04/2016 1500   ALT 14 11/10/2018 1434   ALT 15 11/04/2016 1500   ALKPHOS 75 11/10/2018 1434   ALKPHOS 87 11/04/2016 1500   BILITOT 0.4 11/10/2018 1434   BILITOT 0.3 06/24/2017 1236   BILITOT 0.31 11/04/2016 1500      Component Value Date/Time   TSH 2.030 06/24/2017 1236   TSH 0.129 (L) 08/28/2014 1620     Ref. Range 12/08/2018 09:58  Vitamin D, 25-Hydroxy Latest Ref Range: 30.0 - 100.0 ng/mL 66.6    I, Doreene Nest, am acting as Location manager for [employer], PA-C I, Abby Potash, PA-C have reviewed above note and agree with its content

## 2019-02-06 ENCOUNTER — Encounter (INDEPENDENT_AMBULATORY_CARE_PROVIDER_SITE_OTHER): Payer: Self-pay | Admitting: Family Medicine

## 2019-02-06 ENCOUNTER — Telehealth (INDEPENDENT_AMBULATORY_CARE_PROVIDER_SITE_OTHER): Payer: 59 | Admitting: Family Medicine

## 2019-02-06 ENCOUNTER — Ambulatory Visit (INDEPENDENT_AMBULATORY_CARE_PROVIDER_SITE_OTHER): Payer: 59 | Admitting: Physician Assistant

## 2019-02-06 ENCOUNTER — Other Ambulatory Visit (INDEPENDENT_AMBULATORY_CARE_PROVIDER_SITE_OTHER): Payer: Self-pay | Admitting: Physician Assistant

## 2019-02-06 ENCOUNTER — Other Ambulatory Visit: Payer: Self-pay

## 2019-02-06 DIAGNOSIS — I10 Essential (primary) hypertension: Secondary | ICD-10-CM | POA: Diagnosis not present

## 2019-02-06 DIAGNOSIS — Z6832 Body mass index (BMI) 32.0-32.9, adult: Secondary | ICD-10-CM | POA: Diagnosis not present

## 2019-02-06 DIAGNOSIS — E8881 Metabolic syndrome: Secondary | ICD-10-CM | POA: Diagnosis not present

## 2019-02-06 DIAGNOSIS — E669 Obesity, unspecified: Secondary | ICD-10-CM | POA: Diagnosis not present

## 2019-02-06 MED ORDER — AMLODIPINE BESYLATE 10 MG PO TABS: 10.0000 mg | ORAL_TABLET | Freq: Every day | ORAL | 0 refills | Status: DC

## 2019-02-08 NOTE — Progress Notes (Signed)
TeleHealth Visit:  Due to the COVID-19 pandemic, this visit was completed with telemedicine (audio/video) technology to reduce patient and provider exposure as well as to preserve personal protective equipment.   Jennifer Frazier has verbally consented to this TeleHealth visit. The patient is located at home, the provider is located at the [employer] [facility] office. The participants in this visit include the listed provider and patient and any and all parties involved. The visit was conducted today via WebEx.   Chief Complaint: OBESITY Jennifer Frazier is here to discuss her progress with her obesity treatment plan along with follow-up of her obesity related diagnoses. Jennifer Frazier is keeping a food journal of 1500 calories and 90 grams of protein and states she is following her eating plan approximately 90% of the time. Jennifer Frazier states she is not exercising consistently.  Today's visit was #: 102 Starting weight: 294 lbs Starting date: 06/24/2017  Interim History: Jennifer Frazier was consistent with journaling over the past several weeks, but she reports she has not been exercising nearly as much. Her weight is 221 pounds at home today. She did indulge in pie one day. Jennifer Frazier is consistently meeting her protein goals. Jennifer Frazier is doing very well overall on the plan.  Subjective:   Essential hypertension  Jennifer Frazier hypertension is well controlled on Norvasc 10 mg daily. Her blood pressure at home today was 127/75.   Insulin resistance Jennifer Frazier is not on metformin. Her last A1c was 5.0 and her fasting insulin was elevated at 11.0 (12/08/18). She denies polyphagia.  Assessment/Plan:   Essential hypertension  Jennifer Frazier is working on healthy weight loss and exercise to improve blood pressure control. Jennifer Frazier agrees to continue Amlodipine 10 mg daily #30 with no refills. We will watch for signs of hypotension as she continues her lifestyle modifications.  Insulin resistance Jennifer Frazier will continue to work on weight loss,  exercise, and decreasing simple carbohydrates to help decrease the risk of diabetes. Jennifer Frazier agreed to follow-up with [name] as directed to closely monitor her progress.  Obesity Jennifer Frazier is currently in the action stage of change. As such, her goal is to continue with weight loss efforts. She has agreed to keeping a food journal and adhering to recommended goals of 1400 to 1500 calories and 90 grams of protein daily.   We discussed the following exercise goals today: For substantial health benefits, adults should do at least 150 minutes (2 hours and 30 minutes) a week of moderate-intensity, or 75 minutes (1 hour and 15 minutes) a week of vigorous-intensity aerobic physical activity, or an equivalent combination of moderate- and vigorous-intensity aerobic activity. Aerobic activity should be performed in episodes of at least 10 minutes, and preferably, it should be spread throughout the week. Adults should also include muscle-strengthening activities that involve all major muscle groups on 2 or more days a week.  We discussed the following behavioral modification strategies today: planning for success.  Jennifer Frazier has agreed to follow-up with our clinic in 2 weeks. She was informed of the importance of frequent follow-up visits to maximize her success with intensive lifestyle modifications for her multiple health conditions.  Objective:   VITALS: Per patient if applicable, see vitals. GENERAL: Alert and in no acute distress. CARDIOPULMONARY: No increased WOB. Speaking in clear sentences.  PSYCH: Pleasant and cooperative. Speech normal rate and rhythm. Affect is appropriate. Insight and judgement are appropriate. Attention is focused, linear, and appropriate.  NEURO: Oriented as arrived to appointment on time with no prompting.  Lab Results  Component Value Date   CREATININE 0.98 11/10/2018   BUN 18 11/10/2018   NA 141 11/10/2018   K 3.5 11/10/2018   CL 104 11/10/2018   CO2 27 11/10/2018   Lab  Results  Component Value Date   ALT 14 11/10/2018   AST 20 11/10/2018   ALKPHOS 75 11/10/2018   BILITOT 0.4 11/10/2018   Lab Results  Component Value Date   HGBA1C 5.0 12/08/2018   HGBA1C 5.1 12/16/2017   HGBA1C 5.3 06/24/2017   Lab Results  Component Value Date   INSULIN 11.0 12/08/2018   INSULIN 13.5 12/16/2017   INSULIN 6.2 06/24/2017   Lab Results  Component Value Date   TSH 2.030 06/24/2017   Lab Results  Component Value Date   CHOL 128 06/24/2017   HDL 57 06/24/2017   LDLCALC 55 06/24/2017   TRIG 80 06/24/2017   Lab Results  Component Value Date   WBC 5.9 11/10/2018   HGB 13.7 11/10/2018   HCT 41.1 11/10/2018   MCV 84.9 11/10/2018   PLT 196 11/10/2018   No results found for: IRON, TIBC, FERRITIN   Ref. Range 12/08/2018 09:58  Vitamin D, 25-Hydroxy Latest Ref Range: 30.0 - 100.0 ng/mL 66.6   Attestation Statements:   Reviewed by clinician on day of visit: allergies, medications, problem list, medical history, surgical history, family history, social history, and previous encounter notes.  Corey Skains, am acting as Location manager for [employer], FNP-C.  I have reviewed the above documentation for accuracy and completeness, and I agree with the above. - Georgianne Fick, FNP

## 2019-02-15 NOTE — Telephone Encounter (Signed)
No entry 

## 2019-02-20 ENCOUNTER — Telehealth (INDEPENDENT_AMBULATORY_CARE_PROVIDER_SITE_OTHER): Payer: 59 | Admitting: Family Medicine

## 2019-02-20 ENCOUNTER — Other Ambulatory Visit: Payer: Self-pay

## 2019-02-20 DIAGNOSIS — E669 Obesity, unspecified: Secondary | ICD-10-CM

## 2019-02-20 DIAGNOSIS — I10 Essential (primary) hypertension: Secondary | ICD-10-CM | POA: Diagnosis not present

## 2019-02-20 DIAGNOSIS — Z6832 Body mass index (BMI) 32.0-32.9, adult: Secondary | ICD-10-CM | POA: Diagnosis not present

## 2019-02-21 NOTE — Progress Notes (Signed)
TeleHealth Visit:  Due to the COVID-19 pandemic, this visit was completed with telemedicine (audio/video) technology to reduce patient and provider exposure as well as to preserve personal protective equipment.   Jennifer Frazier has verbally consented to this TeleHealth visit. The patient is located at home, the provider is located at the [facility] office. The participants in this visit include the listed provider and patient. The visit was conducted today via Webex.  Chief Complaint: OBESITY Jennifer Frazier is here to discuss her progress with her obesity treatment plan along with follow-up of her obesity related diagnoses. Jennifer Frazier is on keeping a food journal and adhering to recommended goals of 1400-1500 calories and 90 grams of protein and states she is following her eating plan approximately 70-80% of the time. Jennifer Frazier states she is walking for 30 minutes 5 times per week.  Today's visit was #: 17 Starting weight: 294 lbs Starting date: 06/24/2017  Interim History: Jennifer Frazier reports gaining 3 pounds since her last visit.  She says she weighs 224 lbs today.  She says she has been snacking more over the last few weeks  She has not been keeping a log consistently but reports she always eats the same things.  She admits to not drinking enough water.  Subjective:   1. Essential hypertension Jennifer Frazier's blood pressure is slightly elevated today at 142/82.  She is compliant with her blood pressure medication.  She says she has not been checking it at home regularly.    BP Readings from Last 3 Encounters:  12/08/18 120/82  11/10/18 128/81  03/28/18 134/85   Assessment/Plan:   1. Essential hypertension Jennifer Frazier will continue her Norvasc 10 mg daily and check her blood pressure 2-3 times per week at home and write it down.  2. Class 1 obesity with serious comorbidity and body mass index (BMI) of 32.0 to 32.9 in adult, unspecified obesity type Jennifer Frazier is currently in the action stage of change. As such,  her goal is to continue with weight loss efforts. She has agreed to keeping a food journal and adhering to recommended goals of 1400-1500 calories and 90 grams of protein daily.   Exercise goals: Continue current regimen.  Behavioral modification strategies: planning for success and keeping a strict food journal  Jennifer Frazier has agreed to follow-up with our clinic in 2 weeks. She was informed of the importance of frequent follow-up visits to maximize her success with intensive lifestyle modifications for her multiple health conditions.  Objective:   VITALS: Per patient if applicable, see vitals. GENERAL: Alert and in no acute distress. CARDIOPULMONARY: No increased WOB. Speaking in clear sentences.  PSYCH: Pleasant and cooperative. Speech normal rate and rhythm. Affect is appropriate. Insight and judgement are appropriate. Attention is focused, linear, and appropriate.  NEURO: Oriented as arrived to appointment on time with no prompting.   Lab Results  Component Value Date   CREATININE 0.98 11/10/2018   BUN 18 11/10/2018   NA 141 11/10/2018   K 3.5 11/10/2018   CL 104 11/10/2018   CO2 27 11/10/2018   Lab Results  Component Value Date   ALT 14 11/10/2018   AST 20 11/10/2018   ALKPHOS 75 11/10/2018   BILITOT 0.4 11/10/2018   Lab Results  Component Value Date   HGBA1C 5.0 12/08/2018   HGBA1C 5.1 12/16/2017   HGBA1C 5.3 06/24/2017   Lab Results  Component Value Date   INSULIN 11.0 12/08/2018   INSULIN 13.5 12/16/2017   INSULIN 6.2 06/24/2017   Lab  Results  Component Value Date   TSH 2.030 06/24/2017   Lab Results  Component Value Date   CHOL 128 06/24/2017   HDL 57 06/24/2017   LDLCALC 55 06/24/2017   TRIG 80 06/24/2017   Lab Results  Component Value Date   WBC 5.9 11/10/2018   HGB 13.7 11/10/2018   HCT 41.1 11/10/2018   MCV 84.9 11/10/2018   PLT 196 11/10/2018   Attestation Statements:   Reviewed by clinician on day of visit: allergies, medications, problem  list, medical history, surgical history, family history, social history, and previous encounter notes.  I, Water quality scientist, CMA, am acting as Location manager for [employer], FNP-C.  I have reviewed the above documentation for accuracy and completeness, and I agree with the above. - Georgianne Fick, FNP

## 2019-03-06 ENCOUNTER — Other Ambulatory Visit: Payer: Self-pay

## 2019-03-06 ENCOUNTER — Encounter (INDEPENDENT_AMBULATORY_CARE_PROVIDER_SITE_OTHER): Payer: Self-pay | Admitting: Family Medicine

## 2019-03-06 ENCOUNTER — Telehealth (INDEPENDENT_AMBULATORY_CARE_PROVIDER_SITE_OTHER): Payer: 59 | Admitting: Family Medicine

## 2019-03-06 DIAGNOSIS — E669 Obesity, unspecified: Secondary | ICD-10-CM | POA: Diagnosis not present

## 2019-03-06 DIAGNOSIS — Z6832 Body mass index (BMI) 32.0-32.9, adult: Secondary | ICD-10-CM

## 2019-03-06 DIAGNOSIS — G4733 Obstructive sleep apnea (adult) (pediatric): Secondary | ICD-10-CM

## 2019-03-06 DIAGNOSIS — I10 Essential (primary) hypertension: Secondary | ICD-10-CM

## 2019-03-07 MED ORDER — AMLODIPINE BESYLATE 10 MG PO TABS: 10.0000 mg | ORAL_TABLET | Freq: Every day | ORAL | 0 refills | Status: DC

## 2019-03-07 NOTE — Progress Notes (Signed)
TeleHealth Visit:  Due to the COVID-19 pandemic, this visit was completed with telemedicine (audio/video) technology to reduce patient and provider exposure as well as to preserve personal protective equipment.   Jennifer Frazier has verbally consented to this TeleHealth visit. The patient is located at home, the provider is located at the [facility] office. The participants in this visit include the listed provider and patient and any and all parties involved. The visit was conducted today via telephone. Call was 21 minutes in length.  Jennifer Frazier was unable to use realtime audiovisual technology today and the telehealth visit was conducted via telephone.   Chief Complaint: OBESITY Jennifer Frazier is here to discuss her progress with her obesity treatment plan along with follow-up of her obesity related diagnoses. Jennifer Frazier is on the keeping a food journal of 1400 to 1500 calories and 90 grams of protein daily plan and states she is following her eating plan approximately 90% of the time. Jennifer Frazier states she is walking 25 to 30 minutes 3 to 4 times per week.  Today's visit was #: 17 Starting weight: 294 lbs Starting date: 06/24/2017  Interim History: Shemeeka reports having lost 2 pounds, and her weight today is 222 pounds (03/06/19). Her goal weight is 180 pounds (26 BMI). She is doing quite well on the plan and she has lost more than 70 pounds.  Subjective:   Essential hypertension  HTN is well controlled on amlodipine 10 mg. Her blood pressure today at home was 128/77.  BP Readings from Last 3 Encounters:  12/08/18 120/82  11/10/18 128/81  03/28/18 134/85   Lab Results  Component Value Date   CREATININE 0.98 11/10/2018   CREATININE 0.94 11/04/2017   CREATININE 0.94 06/24/2017   OSA (obstructive sleep apnea) Jennifer Frazier had a sleep study which showed obstructive sleep apnea. She has not followed through with obtaining CPAP. She does not want a CPAP if she can avoid it.   Assessment/Plan:    Essential hypertension  Jennifer Frazier is working on healthy weight loss and exercise to improve blood pressure control. We will watch for signs of hypotension as she continues her lifestyle modifications. Jennifer Frazier agrees to continue amLODipine (NORVASC) 10 MG tablet daily #30 with no refills.  OSA (obstructive sleep apnea) I encouraged Jennifer Frazier to follow up with neurology to discuss treatment options and she agreed to this.  Class 1 obesity with serious comorbidity and body mass index (BMI) of 32.0 to 32.9 in adult, unspecified obesity type Jennifer Frazier is currently in the action stage of change. As such, her goal is to continue with weight loss efforts. She has agreed to keeping a food journal and adhering to recommended goals of 1400 to 1500 calories and 90 grams of protein daily.   Exercise goals: Jennifer Frazier will continue her current exercise regimen.  Behavioral modification strategies: planning for success and keeping a strict food journal.  Jennifer Frazier has agreed to follow-up with our clinic in 2 weeks. She was informed of the importance of frequent follow-up visits to maximize her success with intensive lifestyle modifications for her multiple health conditions.  Objective:   VITALS: Per patient if applicable, see vitals. GENERAL: Alert and in no acute distress. CARDIOPULMONARY: No increased WOB. Speaking in clear sentences.  PSYCH: Pleasant and cooperative. Speech normal rate and rhythm. Affect is appropriate. Insight and judgement are appropriate. Attention is focused, linear, and appropriate.  NEURO: Oriented as arrived to appointment on time with no prompting.   Lab Results  Component Value Date  CREATININE 0.98 11/10/2018   BUN 18 11/10/2018   NA 141 11/10/2018   K 3.5 11/10/2018   CL 104 11/10/2018   CO2 27 11/10/2018   Lab Results  Component Value Date   ALT 14 11/10/2018   AST 20 11/10/2018   ALKPHOS 75 11/10/2018   BILITOT 0.4 11/10/2018   Lab Results  Component Value Date   HGBA1C 5.0  12/08/2018   HGBA1C 5.1 12/16/2017   HGBA1C 5.3 06/24/2017   Lab Results  Component Value Date   INSULIN 11.0 12/08/2018   INSULIN 13.5 12/16/2017   INSULIN 6.2 06/24/2017   Lab Results  Component Value Date   TSH 2.030 06/24/2017   Lab Results  Component Value Date   CHOL 128 06/24/2017   HDL 57 06/24/2017   LDLCALC 55 06/24/2017   TRIG 80 06/24/2017   Lab Results  Component Value Date   WBC 5.9 11/10/2018   HGB 13.7 11/10/2018   HCT 41.1 11/10/2018   MCV 84.9 11/10/2018   PLT 196 11/10/2018   No results found for: IRON, TIBC, FERRITIN   Ref. Range 12/08/2018 09:58  Vitamin D, 25-Hydroxy Latest Ref Range: 30.0 - 100.0 ng/mL 66.6   Attestation Statements:   Reviewed by clinician on day of visit: allergies, medications, problem list, medical history, surgical history, family history, social history, and previous encounter notes.  Corey Skains, am acting as Location manager for [employer], FNP-C.  I have reviewed the above documentation for accuracy and completeness, and I agree with the above. - Kiefer Opheim [employer], FNP-C

## 2019-03-08 ENCOUNTER — Other Ambulatory Visit (INDEPENDENT_AMBULATORY_CARE_PROVIDER_SITE_OTHER): Payer: Self-pay | Admitting: Physician Assistant

## 2019-03-08 DIAGNOSIS — I10 Essential (primary) hypertension: Secondary | ICD-10-CM

## 2019-03-20 ENCOUNTER — Encounter (INDEPENDENT_AMBULATORY_CARE_PROVIDER_SITE_OTHER): Payer: Self-pay | Admitting: Family Medicine

## 2019-03-20 ENCOUNTER — Telehealth (INDEPENDENT_AMBULATORY_CARE_PROVIDER_SITE_OTHER): Payer: 59 | Admitting: Family Medicine

## 2019-03-20 ENCOUNTER — Other Ambulatory Visit: Payer: Self-pay

## 2019-03-20 DIAGNOSIS — I10 Essential (primary) hypertension: Secondary | ICD-10-CM

## 2019-03-20 DIAGNOSIS — E669 Obesity, unspecified: Secondary | ICD-10-CM

## 2019-03-20 DIAGNOSIS — Z6832 Body mass index (BMI) 32.0-32.9, adult: Secondary | ICD-10-CM | POA: Diagnosis not present

## 2019-03-21 NOTE — Progress Notes (Signed)
TeleHealth Visit:  Due to the COVID-19 pandemic, this visit was completed with telemedicine (audio/video) technology to reduce patient and provider exposure as well as to preserve personal protective equipment.   Jennifer Frazier has verbally consented to this TeleHealth visit. The patient is located at home, the provider is located at the [facility] office. The participants in this visit include the listed provider and patient and any and all parties involved. The visit was conducted today via WebEx.  Chief Complaint: OBESITY Jennifer Frazier is here to discuss her progress with her obesity treatment plan along with follow-up of her obesity related diagnoses. Jennifer Frazier is keeping a food journal of 1400 to 1500 calories and 90 grams of protein daily and states she is following her eating plan approximately 50% of the time. Jennifer Frazier states she is exercising 0 minutes 0 times per week.  Today's visit was #: 97 Starting weight: 294 lbs Starting date: 06/24/2017  Interim History: Jennifer Frazier reports she has gained 1 pound of weight. Today her weight is 223 pounds (03/20/19). Her power was out recently, which caused her to deviate from her normal eating plan. She also has not exercised. Jennifer Frazier is now back on the plan. She denies polyphagia.  Subjective:   Essential hypertension Phenix's blood pressure is well controlled on 10 mg of Norvasc.  BP Readings from Last 3 Encounters:  12/08/18 120/82  11/10/18 128/81  03/28/18 134/85   Lab Results  Component Value Date   CREATININE 0.98 11/10/2018   CREATININE 0.94 11/04/2017   CREATININE 0.94 06/24/2017    Assessment/Plan:   Essential hypertension Jennifer Frazier is working on healthy weight loss and exercise to improve blood pressure control. She will continue Norvasc. We will watch for signs of hypotension as she continues her lifestyle modifications.  Class 1 obesity with serious comorbidity and body mass index (BMI) of 32.0 to 32.9 in adult, unspecified  obesity type Jennifer Frazier is currently in the action stage of change. As such, her goal is to continue with weight loss efforts. She has agreed to keeping a food journal and adhering to recommended goals of 1400 to 1500 calories and 90 grams of protein daily.   Exercise goals: Jennifer Frazier will start back exercising (walking and videos).  Behavioral modification strategies: decreasing eating out, planning for success and keeping a strict food journal.  Jennifer Frazier has agreed to follow-up with our clinic in 2 weeks. She was informed of the importance of frequent follow-up visits to maximize her success with intensive lifestyle modifications for her multiple health conditions.  Objective:   VITALS: Per patient if applicable, see vitals. GENERAL: Alert and in no acute distress. CARDIOPULMONARY: No increased WOB. Speaking in clear sentences.  PSYCH: Pleasant and cooperative. Speech normal rate and rhythm. Affect is appropriate. Insight and judgement are appropriate. Attention is focused, linear, and appropriate.  NEURO: Oriented as arrived to appointment on time with no prompting.   Lab Results  Component Value Date   CREATININE 0.98 11/10/2018   BUN 18 11/10/2018   NA 141 11/10/2018   K 3.5 11/10/2018   CL 104 11/10/2018   CO2 27 11/10/2018   Lab Results  Component Value Date   ALT 14 11/10/2018   AST 20 11/10/2018   ALKPHOS 75 11/10/2018   BILITOT 0.4 11/10/2018   Lab Results  Component Value Date   HGBA1C 5.0 12/08/2018   HGBA1C 5.1 12/16/2017   HGBA1C 5.3 06/24/2017   Lab Results  Component Value Date   INSULIN 11.0 12/08/2018  INSULIN 13.5 12/16/2017   INSULIN 6.2 06/24/2017   Lab Results  Component Value Date   TSH 2.030 06/24/2017   Lab Results  Component Value Date   CHOL 128 06/24/2017   HDL 57 06/24/2017   LDLCALC 55 06/24/2017   TRIG 80 06/24/2017   Lab Results  Component Value Date   WBC 5.9 11/10/2018   HGB 13.7 11/10/2018   HCT 41.1 11/10/2018   MCV 84.9 11/10/2018    PLT 196 11/10/2018   No results found for: IRON, TIBC, FERRITIN   Ref. Range 12/08/2018 09:58  Vitamin D, 25-Hydroxy Latest Ref Range: 30.0 - 100.0 ng/mL 66.6   Attestation Statements:   Reviewed by clinician on day of visit: allergies, medications, problem list, medical history, surgical history, family history, social history, and previous encounter notes.  Corey Skains, am acting as Location manager for [employer], FNP-C.  I have reviewed the above documentation for accuracy and completeness, and I agree with the above. - Kyiah Canepa [employer], FNP-C

## 2019-04-03 ENCOUNTER — Encounter (INDEPENDENT_AMBULATORY_CARE_PROVIDER_SITE_OTHER): Payer: Self-pay | Admitting: Family Medicine

## 2019-04-03 ENCOUNTER — Telehealth (INDEPENDENT_AMBULATORY_CARE_PROVIDER_SITE_OTHER): Payer: 59 | Admitting: Family Medicine

## 2019-04-03 ENCOUNTER — Other Ambulatory Visit: Payer: Self-pay

## 2019-04-03 DIAGNOSIS — I10 Essential (primary) hypertension: Secondary | ICD-10-CM

## 2019-04-03 DIAGNOSIS — E8881 Metabolic syndrome: Secondary | ICD-10-CM

## 2019-04-03 DIAGNOSIS — E669 Obesity, unspecified: Secondary | ICD-10-CM | POA: Diagnosis not present

## 2019-04-03 DIAGNOSIS — Z6832 Body mass index (BMI) 32.0-32.9, adult: Secondary | ICD-10-CM

## 2019-04-03 MED ORDER — METFORMIN HCL 500 MG PO TABS: 500.0000 mg | ORAL_TABLET | Freq: Every day | ORAL | 0 refills | Status: DC

## 2019-04-03 MED ORDER — AMLODIPINE BESYLATE 10 MG PO TABS: 10.0000 mg | ORAL_TABLET | Freq: Every day | ORAL | 0 refills | Status: DC

## 2019-04-04 NOTE — Progress Notes (Signed)
TeleHealth Visit:  Due to the COVID-19 pandemic, this visit was completed with telemedicine (audio/video) technology to reduce patient and provider exposure as well as to preserve personal protective equipment.   Jennifer Frazier has verbally consented to this TeleHealth visit. The patient is located at home, the provider is located at the [facility] office. The participants in this visit include the listed provider and patient and any and all parties involved. The visit was conducted today via WebEx.  Chief Complaint: OBESITY Jennifer Frazier is here to discuss her progress with her obesity treatment plan along with follow-up of her obesity related diagnoses. Jennifer Frazier is keeping a food journal of 1400 to 1500 calories and 90 grams of protein daily and states she is following her eating plan approximately 80% of the time. Jennifer Frazier states she is walking 25 minutes 5 times per week.  Today's visit was #: 41 Starting weight: 294 lbs Starting date: 06/24/2017  Interim History: Jennifer Frazier reports weight loss and her weight today is 222 pounds at home. She reports going over in calories recently. She notes increased hunger, especially at lunch. She has increased her water intake. She is a bit bored with lunch options.  Subjective:   Essential hypertension Jennifer Frazier's blood pressure is well controlled. Her blood pressure was 134/77 at home today.  BP Readings from Last 3 Encounters:  12/08/18 120/82  11/10/18 128/81  03/28/18 134/85   Lab Results  Component Value Date   CREATININE 0.98 11/10/2018   CREATININE 0.94 11/04/2017   CREATININE 0.94 06/24/2017   Insulin resistance  Jennifer Frazier has a diagnosis of insulin resistance based on her elevated fasting insulin level >5. She continues to work on diet and exercise to decrease her risk of diabetes. Jennifer Frazier admits to polyphagia in the afternoon.  Lab Results  Component Value Date   INSULIN 11.0 12/08/2018   INSULIN 13.5 12/16/2017   INSULIN 6.2  06/24/2017   Lab Results  Component Value Date   HGBA1C 5.0 12/08/2018   Assessment/Plan:   Essential hypertension  Narda is working on healthy weight loss and exercise to improve blood pressure control. Tynise agrees to continue Norvasc 10 mg once daily #30 with no refills. We will watch for signs of hypotension as she continues her lifestyle modifications.  Insulin resistance  Jennifer Frazier will continue to work on weight loss, exercise, and decreasing simple carbohydrates to help decrease the risk of diabetes. Jennifer Frazier agreed to start metformin 500 mg once daily with lunch #30 with no refills and follow-up with [name] as directed to closely monitor her progress.  Class 1 obesity with serious comorbidity and body mass index (BMI) of 32.0 to 32.9 in adult, unspecified obesity type Jennifer Frazier is currently in the action stage of change. As such, her goal is to continue with weight loss efforts. She has agreed to keeping a food journal and adhering to recommended goals of 1400 to 1500 calories and 90 grams of protein daily.   Handout sent via MyChart: Lunch Options  Exercise goals: Jennifer Frazier will continue her current exercise regimen.  Behavioral modification strategies: meal planning and cooking strategies and planning for success. Discussed meal ideas for lunch.   Jennifer Frazier has agreed to follow-up with our clinic in 2 weeks. She was informed of the importance of frequent follow-up visits to maximize her success with intensive lifestyle modifications for her multiple health conditions.  Objective:   VITALS: Per patient if applicable, see vitals. GENERAL: Alert and in no acute distress. CARDIOPULMONARY: No increased WOB.  Speaking in clear sentences.  PSYCH: Pleasant and cooperative. Speech normal rate and rhythm. Affect is appropriate. Insight and judgement are appropriate. Attention is focused, linear, and appropriate.  NEURO: Oriented as arrived to appointment on time with no prompting.   Lab Results    Component Value Date   CREATININE 0.98 11/10/2018   BUN 18 11/10/2018   NA 141 11/10/2018   K 3.5 11/10/2018   CL 104 11/10/2018   CO2 27 11/10/2018   Lab Results  Component Value Date   ALT 14 11/10/2018   AST 20 11/10/2018   ALKPHOS 75 11/10/2018   BILITOT 0.4 11/10/2018   Lab Results  Component Value Date   HGBA1C 5.0 12/08/2018   HGBA1C 5.1 12/16/2017   HGBA1C 5.3 06/24/2017   Lab Results  Component Value Date   INSULIN 11.0 12/08/2018   INSULIN 13.5 12/16/2017   INSULIN 6.2 06/24/2017   Lab Results  Component Value Date   TSH 2.030 06/24/2017   Lab Results  Component Value Date   CHOL 128 06/24/2017   HDL 57 06/24/2017   LDLCALC 55 06/24/2017   TRIG 80 06/24/2017   Lab Results  Component Value Date   WBC 5.9 11/10/2018   HGB 13.7 11/10/2018   HCT 41.1 11/10/2018   MCV 84.9 11/10/2018   PLT 196 11/10/2018   No results found for: IRON, TIBC, FERRITIN   Ref. Range 12/08/2018 09:58  Vitamin D, 25-Hydroxy Latest Ref Range: 30.0 - 100.0 ng/mL 66.6   Attestation Statements:   Reviewed by clinician on day of visit: allergies, medications, problem list, medical history, surgical history, family history, social history, and previous encounter notes.  Corey Skains, am acting as Location manager for [employer], FNP-C.  I have reviewed the above documentation for accuracy and completeness, and I agree with the above. - Anuhea Gassner [employer], FNP-C

## 2019-04-18 ENCOUNTER — Encounter (INDEPENDENT_AMBULATORY_CARE_PROVIDER_SITE_OTHER): Payer: Self-pay | Admitting: Family Medicine

## 2019-04-18 ENCOUNTER — Other Ambulatory Visit: Payer: Self-pay

## 2019-04-18 ENCOUNTER — Ambulatory Visit (INDEPENDENT_AMBULATORY_CARE_PROVIDER_SITE_OTHER): Payer: 59 | Admitting: Family Medicine

## 2019-04-18 VITALS — BP 108/72 | HR 86 | Temp 98.4°F | Ht 69.0 in | Wt 219.0 lb

## 2019-04-18 DIAGNOSIS — Z6832 Body mass index (BMI) 32.0-32.9, adult: Secondary | ICD-10-CM

## 2019-04-18 DIAGNOSIS — E669 Obesity, unspecified: Secondary | ICD-10-CM | POA: Diagnosis not present

## 2019-04-18 DIAGNOSIS — E8881 Metabolic syndrome: Secondary | ICD-10-CM

## 2019-04-18 DIAGNOSIS — I10 Essential (primary) hypertension: Secondary | ICD-10-CM | POA: Diagnosis not present

## 2019-04-18 DIAGNOSIS — E559 Vitamin D deficiency, unspecified: Secondary | ICD-10-CM

## 2019-04-18 NOTE — Progress Notes (Signed)
Chief Complaint:   OBESITY Jennifer Frazier is here to discuss her progress with her obesity treatment plan along with follow-up of her obesity related diagnoses. Jennifer Frazier is on keeping a food journal and adhering to recommended goals of 1400-1500 calories and 90 grams of protein daily and states she is following her eating plan approximately 90% of the time. Jennifer Frazier states she is walking for 25 minutes 5 times per week.  Today's visit was #: 66 Starting weight: 294 lbs Starting date: 06/24/2017 Today's weight: 219 lbs Today's date: 04/18/2019 Total lbs lost to date: 75 Total lbs lost since last in-office visit: 2  Interim History: Her last in office visit was 12/08/2018 and she is down 2 lbs.  Jennifer Frazier does go over on her calories slightly at times (up to 1550). She does feel she is in a rut as far as what she eats.   Subjective:   1. Insulin resistance Jennifer Frazier is on metformin with lunch. She notes polyphagia at times. Lab Results  Component Value Date   HGBA1C 5.0 12/08/2018    2. Essential hypertension Jennifer Frazier's blood pressure is well controlled on Norvasc. She denies chest pain or shortness of breath. BP Readings from Last 3 Encounters:  04/18/19 108/72  12/08/18 120/82  11/10/18 128/81    3. Vitamin D deficiency Jennifer Frazier is not on supplementation. Last Vit D level was at goal.  Assessment/Plan:   1. Insulin resistance Jennifer Frazier will continue to work on weight loss, exercise, and decreasing simple carbohydrates to help decrease the risk of diabetes. We will check labs today. Jennifer Frazier agreed to follow-up with [name] as directed to closely monitor her progress.  - Hemoglobin A1c - Insulin, random - T3 - T4, free - TSH  2. Essential hypertension Naylee will continue Norvasc, and will continue to work on healthy weight loss and exercise to improve blood pressure control. We will watch for signs of hypotension as she continues her lifestyle modifications. We will check labs today.  - Comprehensive  metabolic panel - Lipid Panel With LDL/HDL Ratio - T3 - T4, free - TSH  3. Vitamin D deficiency Low Vitamin D level contributes to fatigue and are associated with obesity, breast, and colon cancer. Jennifer Frazier will follow-up for routine testing of Vitamin D, at least 2-3 times per year to avoid over-replacement. We will check labs today.  - VITAMIN D 25 Hydroxy (Vit-D Deficiency, Fractures) - T3 - T4, free - TSH  4. Class 1 obesity with serious comorbidity and body mass index (BMI) of 32.0 to 32.9 in adult, unspecified obesity type Jennifer Frazier is currently in the action stage of change. As such, her goal is to continue with weight loss efforts. She has agreed to keeping a food journal and adhering to recommended goals of 1400-1500 calories and 90 grams of protein daily.   We discussed strategies to slow down eating.  Exercise goals: Jennifer Frazier is to increase cardio to 30 minutes 5 times per week, and resistance 2 times per week.  Behavioral modification strategies: meal planning and cooking strategies and better snacking choices.  Jennifer Frazier has agreed to follow-up with our clinic in 2 weeks. She was informed of the importance of frequent follow-up visits to maximize her success with intensive lifestyle modifications for her multiple health conditions.   Jennifer Frazier was informed we would discuss her lab results at her next visit unless there is a critical issue that needs to be addressed sooner. Jennifer Frazier agreed to keep her next visit at the agreed upon time to  discuss these results.  Objective:   Blood pressure 108/72, pulse 86, temperature 98.4 F (36.9 C), temperature source Oral, height 5\' 9"  (1.753 m), weight 219 lb (99.3 kg), last menstrual period 09/03/2017, SpO2 98 %. Body mass index is 32.34 kg/m.  General: Cooperative, alert, well developed, in no acute distress. HEENT: Conjunctivae and lids unremarkable. Cardiovascular: Regular rhythm.  Lungs: Normal work of breathing. Neurologic: No focal deficits.    Lab Results  Component Value Date   CREATININE 0.98 11/10/2018   BUN 18 11/10/2018   NA 141 11/10/2018   K 3.5 11/10/2018   CL 104 11/10/2018   CO2 27 11/10/2018   Lab Results  Component Value Date   ALT 14 11/10/2018   AST 20 11/10/2018   ALKPHOS 75 11/10/2018   BILITOT 0.4 11/10/2018   Lab Results  Component Value Date   HGBA1C 5.0 12/08/2018   HGBA1C 5.1 12/16/2017   HGBA1C 5.3 06/24/2017   Lab Results  Component Value Date   INSULIN 11.0 12/08/2018   INSULIN 13.5 12/16/2017   INSULIN 6.2 06/24/2017   Lab Results  Component Value Date   TSH 2.030 06/24/2017   Lab Results  Component Value Date   CHOL 128 06/24/2017   HDL 57 06/24/2017   LDLCALC 55 06/24/2017   TRIG 80 06/24/2017   Lab Results  Component Value Date   WBC 5.9 11/10/2018   HGB 13.7 11/10/2018   HCT 41.1 11/10/2018   MCV 84.9 11/10/2018   PLT 196 11/10/2018   No results found for: IRON, TIBC, FERRITIN  Attestation Statements:   Reviewed by clinician on day of visit: allergies, medications, problem list, medical history, surgical history, family history, social history, and previous encounter notes.   Wilhemena Durie, am acting as Location manager for [employer], FNP-C.  I have reviewed the above documentation for accuracy and completeness, and I agree with the above. -  Georgianne Fick, FNP

## 2019-04-19 LAB — LIPID PANEL WITH LDL/HDL RATIO
Cholesterol, Total: 142 mg/dL (ref 100–199)
HDL: 82 mg/dL (ref >39)
LDL Chol Calc (NIH): 47 mg/dL (ref 0–99)
LDL/HDL Ratio: 0.6 ratio (ref 0.0–3.2)
Triglycerides: 62 mg/dL (ref 0–149)
VLDL Cholesterol Cal: 13 mg/dL (ref 5–40)

## 2019-04-19 LAB — COMPREHENSIVE METABOLIC PANEL
ALT: 13 IU/L (ref 0–32)
AST: 22 IU/L (ref 0–40)
Albumin/Globulin Ratio: 1.3 (ref 1.2–2.2)
Albumin: 4.3 g/dL (ref 3.8–4.8)
Alkaline Phosphatase: 95 IU/L (ref 39–117)
BUN/Creatinine Ratio: 21 (ref 9–23)
BUN: 20 mg/dL (ref 6–24)
Bilirubin Total: 0.3 mg/dL (ref 0.0–1.2)
CO2: 23 mmol/L (ref 20–29)
Calcium: 10.1 mg/dL (ref 8.7–10.2)
Chloride: 104 mmol/L (ref 96–106)
Creatinine, Ser: 0.97 mg/dL (ref 0.57–1.00)
GFR calc Af Amer: 82 mL/min/{1.73_m2} (ref >59)
GFR calc non Af Amer: 71 mL/min/{1.73_m2} (ref >59)
Globulin, Total: 3.2 g/dL (ref 1.5–4.5)
Glucose: 80 mg/dL (ref 65–99)
Potassium: 3.9 mmol/L (ref 3.5–5.2)
Sodium: 142 mmol/L (ref 134–144)
Total Protein: 7.5 g/dL (ref 6.0–8.5)

## 2019-04-19 LAB — HEMOGLOBIN A1C
Est. average glucose Bld gHb Est-mCnc: 94 mg/dL
Hgb A1c MFr Bld: 4.9 % (ref 4.8–5.6)

## 2019-04-19 LAB — VITAMIN D 25 HYDROXY (VIT D DEFICIENCY, FRACTURES): Vit D, 25-Hydroxy: 59.9 ng/mL (ref 30.0–100.0)

## 2019-04-19 LAB — TSH: TSH: 1.6 u[IU]/mL (ref 0.450–4.500)

## 2019-04-19 LAB — T4, FREE: Free T4: 1.24 ng/dL (ref 0.82–1.77)

## 2019-04-19 LAB — INSULIN, RANDOM: INSULIN: 6.7 u[IU]/mL (ref 2.6–24.9)

## 2019-04-19 LAB — T3: T3, Total: 132 ng/dL (ref 71–180)

## 2019-05-09 ENCOUNTER — Ambulatory Visit (INDEPENDENT_AMBULATORY_CARE_PROVIDER_SITE_OTHER): Payer: 59 | Admitting: Family Medicine

## 2019-05-09 ENCOUNTER — Other Ambulatory Visit: Payer: Self-pay

## 2019-05-09 ENCOUNTER — Encounter (INDEPENDENT_AMBULATORY_CARE_PROVIDER_SITE_OTHER): Payer: Self-pay | Admitting: Family Medicine

## 2019-05-09 VITALS — BP 117/79 | HR 70 | Temp 97.8°F | Ht 69.0 in | Wt 219.0 lb

## 2019-05-09 DIAGNOSIS — E66811 Obesity, class 1: Secondary | ICD-10-CM

## 2019-05-09 DIAGNOSIS — Z9189 Other specified personal risk factors, not elsewhere classified: Secondary | ICD-10-CM

## 2019-05-09 DIAGNOSIS — E669 Obesity, unspecified: Secondary | ICD-10-CM | POA: Diagnosis not present

## 2019-05-09 DIAGNOSIS — E8881 Metabolic syndrome: Secondary | ICD-10-CM | POA: Diagnosis not present

## 2019-05-09 DIAGNOSIS — I10 Essential (primary) hypertension: Secondary | ICD-10-CM | POA: Diagnosis not present

## 2019-05-09 DIAGNOSIS — E88819 Insulin resistance, unspecified: Secondary | ICD-10-CM

## 2019-05-09 DIAGNOSIS — Z6832 Body mass index (BMI) 32.0-32.9, adult: Secondary | ICD-10-CM

## 2019-05-09 MED ORDER — AMLODIPINE BESYLATE 10 MG PO TABS: 10.0000 mg | ORAL_TABLET | Freq: Every day | ORAL | 0 refills | Status: DC

## 2019-05-09 NOTE — Progress Notes (Signed)
Chief Complaint:   OBESITY Carlesha is here to discuss her progress with her obesity treatment plan along with follow-up of her obesity related diagnoses. Kati is on keeping a food journal and adhering to recommended goals of 1400-1500 calories and 90 grams of protein daily and states she is following her eating plan approximately 90% of the time. Laurenashley states she is walking for 30 minutes 5 times per week.  Today's visit was #: 24 Starting weight: 294 lbs Starting date: 06/24/2017 Today's weight: 219 lbs Today's date: 05/09/2019 Total lbs lost to date: 75 Total lbs lost since last in-office visit: 0  Interim History: Charlize is disappointed with lack of weight loss. She does report polyphagia. She is curious about Herbalife to boost her metabolism.  Subjective:   1. Essential hypertension Chequita's blood pressure is well controlled today.  2. Insulin resistance Sherria's fasting insulin is down to 6.7. Last A1c was 4.9. She is not taking metformin as directed because she forgets. She notes polyphagia. I discussed labs with the patient today.  3. At risk for side effect of medication Preciosa is at risk for drug side effects due to considering taking Herbalife which may increase blood pressure.  Assessment/Plan:   1. Essential hypertension Markeda is working on healthy weight loss and exercise to improve blood pressure control. We will watch for signs of hypotension as she continues her lifestyle modifications. We will refill Norvasc for 1 month.  - amLODipine (NORVASC) 10 MG tablet; Take 1 tablet (10 mg total) by mouth daily.  Dispense: 30 tablet; Refill: 0  2. Insulin resistance Caliegh will continue to work on weight loss, exercise, and decreasing simple carbohydrates to help decrease the risk of diabetes. We discussed ways to adhere better to metformin. Yaitza agreed to follow-up with [name] as directed to closely monitor her progress.  3. At risk for side effect of medication Lithzy was  given approximately 15 minutes of drug side effect counseling today.  We discussed side effect possibility and risk versus benefits. Brenisha agreed monitor her BP if she decides to try the Herbalife supplement.   Repetitive spaced learning was employed today to elicit superior memory formation and behavioral change.  4. Class 1 obesity with serious comorbidity and body mass index (BMI) of 32.0 to 32.9 in adult, unspecified obesity type Marquis is currently in the action stage of change. As such, her goal is to continue with weight loss efforts. She has agreed to keeping a food journal and adhering to recommended goals of 1500 calories and 90 grams of protein daily.   We discussed possibility of elevated blood pressure from stimulants.  Exercise goals: Odyssey is to increase intensity of workout with body weight resistance.  Behavioral modification strategies: planning for success.  Arthelia has agreed to follow-up with our clinic in 3 weeks. She was informed of the importance of frequent follow-up visits to maximize her success with intensive lifestyle modifications for her multiple health conditions.   Objective:   Blood pressure 117/79, pulse 70, temperature 97.8 F (36.6 C), temperature source Oral, height 5\' 9"  (1.753 m), weight 219 lb (99.3 kg), last menstrual period 09/03/2017, SpO2 100 %. Body mass index is 32.34 kg/m.  General: Cooperative, alert, well developed, in no acute distress. HEENT: Conjunctivae and lids unremarkable. Cardiovascular: Regular rhythm.  Lungs: Normal work of breathing. Neurologic: No focal deficits.   Lab Results  Component Value Date   CREATININE 0.97 04/18/2019   BUN 20 04/18/2019   NA 142 04/18/2019  K 3.9 04/18/2019   CL 104 04/18/2019   CO2 23 04/18/2019   Lab Results  Component Value Date   ALT 13 04/18/2019   AST 22 04/18/2019   ALKPHOS 95 04/18/2019   BILITOT 0.3 04/18/2019   Lab Results  Component Value Date   HGBA1C 4.9 04/18/2019    HGBA1C 5.0 12/08/2018   HGBA1C 5.1 12/16/2017   HGBA1C 5.3 06/24/2017   Lab Results  Component Value Date   INSULIN 6.7 04/18/2019   INSULIN 11.0 12/08/2018   INSULIN 13.5 12/16/2017   INSULIN 6.2 06/24/2017   Lab Results  Component Value Date   TSH 1.600 04/18/2019   Lab Results  Component Value Date   CHOL 142 04/18/2019   HDL 82 04/18/2019   LDLCALC 47 04/18/2019   TRIG 62 04/18/2019   Lab Results  Component Value Date   WBC 5.9 11/10/2018   HGB 13.7 11/10/2018   HCT 41.1 11/10/2018   MCV 84.9 11/10/2018   PLT 196 11/10/2018   No results found for: IRON, TIBC, FERRITIN  Attestation Statements:   Reviewed by clinician on day of visit: allergies, medications, problem list, medical history, surgical history, family history, social history, and previous encounter notes.   Wilhemena Durie, am acting as Location manager for [employer], FNP-C.  I have reviewed the above documentation for accuracy and completeness, and I agree with the above. - Georgianne Fick, FNP

## 2019-05-30 ENCOUNTER — Ambulatory Visit (INDEPENDENT_AMBULATORY_CARE_PROVIDER_SITE_OTHER): Payer: 59 | Admitting: Family Medicine

## 2019-05-30 ENCOUNTER — Other Ambulatory Visit: Payer: Self-pay

## 2019-05-30 ENCOUNTER — Encounter (INDEPENDENT_AMBULATORY_CARE_PROVIDER_SITE_OTHER): Payer: Self-pay | Admitting: Family Medicine

## 2019-05-30 VITALS — BP 109/75 | HR 71 | Temp 98.1°F | Ht 69.0 in | Wt 219.0 lb

## 2019-05-30 DIAGNOSIS — Z9189 Other specified personal risk factors, not elsewhere classified: Secondary | ICD-10-CM

## 2019-05-30 DIAGNOSIS — G4733 Obstructive sleep apnea (adult) (pediatric): Secondary | ICD-10-CM | POA: Diagnosis not present

## 2019-05-30 DIAGNOSIS — Z6832 Body mass index (BMI) 32.0-32.9, adult: Secondary | ICD-10-CM

## 2019-05-30 DIAGNOSIS — E8881 Metabolic syndrome: Secondary | ICD-10-CM

## 2019-05-30 DIAGNOSIS — E669 Obesity, unspecified: Secondary | ICD-10-CM

## 2019-05-30 DIAGNOSIS — I10 Essential (primary) hypertension: Secondary | ICD-10-CM | POA: Diagnosis not present

## 2019-05-30 MED ORDER — AMLODIPINE BESYLATE 10 MG PO TABS: 10.0000 mg | ORAL_TABLET | Freq: Every day | ORAL | 0 refills | Status: DC

## 2019-05-30 NOTE — Progress Notes (Signed)
Chief Complaint:   OBESITY Jennifer Frazier is here to discuss her progress with her obesity treatment plan along with follow-up of her obesity related diagnoses. Jennifer Frazier is on keeping a food journal and adhering to recommended goals of 1500 calories and 90 grams of protein daily and states she is following her eating plan approximately 90% of the time. Jennifer Frazier states she is walking for 30 minutes 5 times per week.  Today's visit was #: 85 Starting weight: 294 lbs Starting date: 06/24/2017 Today's weight: 219 lbs Today's date: 05/30/2019 Total lbs lost to date: 75 Total lbs lost since last in-office visit: 0  Interim History: Jennifer Frazier is journaling consistently and meeting her protein and calorie goals. She is quite frustrated with plateau. She is willing to do what she needs to get out of plateau. She is averaging 1500 calories per day and she notes that she does meet her protein goal.  Subjective:   1. Insulin resistance Jennifer Frazier is on metformin 500 mg q daily, but she does not always take it. She notes polyphagia at times. Lab Results  Component Value Date   HGBA1C 4.9 04/18/2019    2. Essential hypertension Jennifer Frazier's blood pressure is well controlled on Norvasc. She denies chest pain or shortness of breath. BP Readings from Last 3 Encounters:  05/30/19 109/75  05/09/19 117/79  04/18/19 108/72     3. OSA (obstructive sleep apnea) Jennifer Frazier was diagnosed with obstructive sleep apnea, but she never got her CPAP machine.  4. At risk for heart disease Jennifer Frazier is at a higher than average risk for cardiovascular disease due to obesity, HTN, Family hx of CV dx, and untreated sleep apnea.  Assessment/Plan:   1. Insulin resistance Jennifer Frazier will continue to work on weight loss, exercise, and decreasing simple carbohydrates to help decrease the risk of diabetes. She will be more consistent with taking her metformin. Jennifer Frazier agreed to follow-up with Jennifer Frazier as directed to closely monitor her progress.  2. Essential  hypertension Jennifer Frazier is working on healthy weight loss and exercise to improve blood pressure control. We will watch for signs of hypotension as she continues her lifestyle modifications. We will refill Norvasc for 1 month.  - amLODipine (NORVASC) 10 MG tablet; Take 1 tablet (10 mg total) by mouth daily.  Dispense: 30 tablet; Refill: 0  3. OSA (obstructive sleep apnea) Intensive lifestyle modifications are the first line treatment for this issue. We discussed several lifestyle modifications today. I advised her to follow up with starting CPAP, and discussed the relationship of obstructive sleep apnea to weight. Eulamae will continue to work on diet, exercise and weight loss efforts. We will continue to monitor.   4. At risk for heart disease Jennifer Frazier was given approximately 15 minutes of coronary artery disease prevention counseling today. She is 44 y.o. female and has risk factors for heart disease including obesity. We discussed intensive lifestyle modifications today with an emphasis on specific weight loss instructions and strategies.   Repetitive spaced learning was employed today to elicit superior memory formation and behavioral change.  5. Class 1 obesity with serious comorbidity and body mass index (BMI) of 32.0 to 32.9 in adult, unspecified obesity type Jennifer Frazier is currently in the action stage of change. As such, her goal is to continue with weight loss efforts. She has agreed to keeping a food journal and adhering to recommended goals of 1400-1500 calories and 85-90 grams of protein daily.   Jennifer Frazier is to decrease daily calories to 1400.  Exercise goals:  As is, plus add resistance training 2 times per week.  Behavioral modification strategies: increasing water intake, planning for success and keeping a strict food journal.  Jennifer Frazier has agreed to follow-up with our clinic in 3 weeks. She was informed of the importance of frequent follow-up visits to maximize her success with intensive lifestyle  modifications for her multiple health conditions.   Objective:   Blood pressure 109/75, pulse 71, temperature 98.1 F (36.7 C), temperature source Oral, height 5\' 9"  (1.753 m), weight 219 lb (99.3 kg), last menstrual period 09/03/2017, SpO2 98 %. Body mass index is 32.34 kg/m.  General: Cooperative, alert, well developed, in no acute distress. HEENT: Conjunctivae and lids unremarkable. Cardiovascular: Regular rhythm.  Lungs: Normal work of breathing. Neurologic: No focal deficits.   Lab Results  Component Value Date   CREATININE 0.97 04/18/2019   BUN 20 04/18/2019   NA 142 04/18/2019   K 3.9 04/18/2019   CL 104 04/18/2019   CO2 23 04/18/2019   Lab Results  Component Value Date   ALT 13 04/18/2019   AST 22 04/18/2019   ALKPHOS 95 04/18/2019   BILITOT 0.3 04/18/2019   Lab Results  Component Value Date   HGBA1C 4.9 04/18/2019   HGBA1C 5.0 12/08/2018   HGBA1C 5.1 12/16/2017   HGBA1C 5.3 06/24/2017   Lab Results  Component Value Date   INSULIN 6.7 04/18/2019   INSULIN 11.0 12/08/2018   INSULIN 13.5 12/16/2017   INSULIN 6.2 06/24/2017   Lab Results  Component Value Date   TSH 1.600 04/18/2019   Lab Results  Component Value Date   CHOL 142 04/18/2019   HDL 82 04/18/2019   LDLCALC 47 04/18/2019   TRIG 62 04/18/2019   Lab Results  Component Value Date   WBC 5.9 11/10/2018   HGB 13.7 11/10/2018   HCT 41.1 11/10/2018   MCV 84.9 11/10/2018   PLT 196 11/10/2018   No results found for: IRON, TIBC, FERRITIN  Attestation Statements:   Reviewed by clinician on day of visit: allergies, medications, problem list, medical history, surgical history, family history, social history, and previous encounter notes.   Jennifer Frazier, am acting as Location manager for [employer], FNP-C.  I have reviewed the above documentation for accuracy and completeness, and I agree with the above. -  Georgianne Fick, FNP

## 2019-05-31 ENCOUNTER — Encounter (INDEPENDENT_AMBULATORY_CARE_PROVIDER_SITE_OTHER): Payer: Self-pay | Admitting: Family Medicine

## 2019-06-05 ENCOUNTER — Other Ambulatory Visit (INDEPENDENT_AMBULATORY_CARE_PROVIDER_SITE_OTHER): Payer: Self-pay | Admitting: Family Medicine

## 2019-06-05 ENCOUNTER — Other Ambulatory Visit (INDEPENDENT_AMBULATORY_CARE_PROVIDER_SITE_OTHER): Payer: Self-pay | Admitting: Physician Assistant

## 2019-06-05 DIAGNOSIS — I10 Essential (primary) hypertension: Secondary | ICD-10-CM

## 2019-06-20 ENCOUNTER — Other Ambulatory Visit: Payer: Self-pay

## 2019-06-20 ENCOUNTER — Encounter (INDEPENDENT_AMBULATORY_CARE_PROVIDER_SITE_OTHER): Payer: Self-pay | Admitting: Family Medicine

## 2019-06-20 ENCOUNTER — Ambulatory Visit (INDEPENDENT_AMBULATORY_CARE_PROVIDER_SITE_OTHER): Payer: 59 | Admitting: Family Medicine

## 2019-06-20 VITALS — BP 109/73 | HR 71 | Temp 98.1°F | Ht 69.0 in | Wt 215.0 lb

## 2019-06-20 DIAGNOSIS — Z6831 Body mass index (BMI) 31.0-31.9, adult: Secondary | ICD-10-CM

## 2019-06-20 DIAGNOSIS — Z9189 Other specified personal risk factors, not elsewhere classified: Secondary | ICD-10-CM | POA: Diagnosis not present

## 2019-06-20 DIAGNOSIS — E669 Obesity, unspecified: Secondary | ICD-10-CM | POA: Diagnosis not present

## 2019-06-20 DIAGNOSIS — I10 Essential (primary) hypertension: Secondary | ICD-10-CM | POA: Diagnosis not present

## 2019-06-20 DIAGNOSIS — E8881 Metabolic syndrome: Secondary | ICD-10-CM

## 2019-06-20 MED ORDER — AMLODIPINE BESYLATE 10 MG PO TABS: 10.0000 mg | ORAL_TABLET | Freq: Every day | ORAL | 0 refills | Status: DC

## 2019-06-20 MED ORDER — METFORMIN HCL 500 MG PO TABS: 500.0000 mg | ORAL_TABLET | Freq: Every day | ORAL | 0 refills | Status: DC

## 2019-06-20 NOTE — Progress Notes (Signed)
Chief Complaint:   OBESITY Jennifer Frazier is here to discuss her progress with her obesity treatment plan along with follow-up of her obesity related diagnoses. Jennifer Frazier is on keeping a food journal and adhering to recommended goals of 1400-1500 calories and 85-90 grams of protein daily and states she is following her eating plan approximately 70% of the time. Jennifer Frazier states she is walking for 30 minutes 5 times per week.  Today's visit was #: 65 Starting weight: 294 lbs Starting date: 06/24/2017 Today's weight: 215 lbs Today's date: 06/20/2019 Total lbs lost to date: 59 Total lbs lost since last in-office visit: 4  Interim History: Jennifer Frazier notes she is eating less due to stress of caring for her ailing mother. She is journaling daily but not meeting her protein goals. She has been averaging 1400 calories daily. She has cut out diet soda.   Subjective:   1. Essential hypertension Jennifer Frazier's blood pressure is well controlled with Norvasc. Cardiovascular ROS: no chest pain or dyspnea on exertion.  BP Readings from Last 3 Encounters:  06/20/19 109/73  05/30/19 109/75  05/09/19 117/79   Lab Results  Component Value Date   CREATININE 0.97 04/18/2019   CREATININE 0.98 11/10/2018   CREATININE 0.94 11/04/2017   2. Insulin resistance Jennifer Frazier has a diagnosis of insulin resistance based on her elevated fasting insulin level >5. She denies polyphagia. She is on metformin but she often forgets to take it. She takes it about 3-4 times per week.   Lab Results  Component Value Date   INSULIN 6.7 04/18/2019   INSULIN 11.0 12/08/2018   INSULIN 13.5 12/16/2017   INSULIN 6.2 06/24/2017   Lab Results  Component Value Date   HGBA1C 4.9 04/18/2019   3. At risk for deficient intake of food The patient is at a higher than average risk of deficient intake of food due to lack of protein.  Assessment/Plan:   1. Essential hypertension Jennifer Frazier is working on healthy weight loss and exercise to improve blood  pressure control. We will watch for signs of hypotension as she continues her lifestyle modifications. We will refill amlodipine for 1 month.  - amLODipine (NORVASC) 10 MG tablet; Take 1 tablet (10 mg total) by mouth daily.  Dispense: 30 tablet; Refill: 0  2. Insulin resistance Jennifer Frazier will continue to work on weight loss, exercise, and decreasing simple carbohydrates to help decrease the risk of diabetes. We will refill metformin for 1 month. Jennifer Frazier agreed to follow-up with [name] as directed to closely monitor her progress.  - metFORMIN (GLUCOPHAGE) 500 MG tablet; Take 1 tablet (500 mg total) by mouth daily with lunch.  Dispense: 30 tablet; Refill: 0  3. At risk for deficient intake of food Jennifer Frazier was given approximately 15 minutes of deficit intake of food prevention counseling today. Jennifer Frazier is at risk for eating too few calories based on current food recall. She was encouraged to focus on meeting caloric and protein goals according to her recommended meal plan.   4. Class 1 obesity with serious comorbidity and body mass index (BMI) of 31.0 to 31.9 in adult, unspecified obesity type Jennifer Frazier is currently in the action stage of change. As such, her goal is to continue with weight loss efforts. She has agreed to keeping a food journal and adhering to recommended goals of 1400-1500 calories and 90 grams of protein daily.   Jennifer Frazier will talk with her mother's primary care physician about possible help with her care.  Exercise goals: As is.  Behavioral  modification strategies: increasing lean protein intake.  Jennifer Frazier has agreed to follow-up with our clinic in 3 weeks. She was informed of the importance of frequent follow-up visits to maximize her success with intensive lifestyle modifications for her multiple health conditions.   Objective:   Blood pressure 109/73, pulse 71, temperature 98.1 F (36.7 C), temperature source Oral, height 5\' 9"  (1.753 m), weight 215 lb (97.5 kg), last menstrual period  09/03/2017, SpO2 99 %. Body mass index is 31.75 kg/m.  General: Cooperative, alert, well developed, in no acute distress. HEENT: Conjunctivae and lids unremarkable. Cardiovascular: Regular rhythm.  Lungs: Normal work of breathing. Neurologic: No focal deficits.   Lab Results  Component Value Date   CREATININE 0.97 04/18/2019   BUN 20 04/18/2019   NA 142 04/18/2019   K 3.9 04/18/2019   CL 104 04/18/2019   CO2 23 04/18/2019   Lab Results  Component Value Date   ALT 13 04/18/2019   AST 22 04/18/2019   ALKPHOS 95 04/18/2019   BILITOT 0.3 04/18/2019   Lab Results  Component Value Date   HGBA1C 4.9 04/18/2019   HGBA1C 5.0 12/08/2018   HGBA1C 5.1 12/16/2017   HGBA1C 5.3 06/24/2017   Lab Results  Component Value Date   INSULIN 6.7 04/18/2019   INSULIN 11.0 12/08/2018   INSULIN 13.5 12/16/2017   INSULIN 6.2 06/24/2017   Lab Results  Component Value Date   TSH 1.600 04/18/2019   Lab Results  Component Value Date   CHOL 142 04/18/2019   HDL 82 04/18/2019   LDLCALC 47 04/18/2019   TRIG 62 04/18/2019   Lab Results  Component Value Date   WBC 5.9 11/10/2018   HGB 13.7 11/10/2018   HCT 41.1 11/10/2018   MCV 84.9 11/10/2018   PLT 196 11/10/2018   No results found for: IRON, TIBC, FERRITIN  Attestation Statements:   Reviewed by clinician on day of visit: allergies, medications, problem list, medical history, surgical history, family history, social history, and previous encounter notes.   Wilhemena Durie, am acting as Location manager for [employer], FNP-C.  I have reviewed the above documentation for accuracy and completeness, and I agree with the above. -  Georgianne Fick, FNP

## 2019-07-12 ENCOUNTER — Encounter (INDEPENDENT_AMBULATORY_CARE_PROVIDER_SITE_OTHER): Payer: Self-pay | Admitting: Family Medicine

## 2019-07-12 ENCOUNTER — Other Ambulatory Visit: Payer: Self-pay

## 2019-07-12 ENCOUNTER — Ambulatory Visit (INDEPENDENT_AMBULATORY_CARE_PROVIDER_SITE_OTHER): Payer: 59 | Admitting: Family Medicine

## 2019-07-12 VITALS — BP 115/79 | HR 79 | Temp 98.2°F | Ht 69.0 in | Wt 214.0 lb

## 2019-07-12 DIAGNOSIS — Z6831 Body mass index (BMI) 31.0-31.9, adult: Secondary | ICD-10-CM

## 2019-07-12 DIAGNOSIS — I10 Essential (primary) hypertension: Secondary | ICD-10-CM | POA: Diagnosis not present

## 2019-07-12 DIAGNOSIS — E88819 Insulin resistance, unspecified: Secondary | ICD-10-CM

## 2019-07-12 DIAGNOSIS — Z9189 Other specified personal risk factors, not elsewhere classified: Secondary | ICD-10-CM

## 2019-07-12 DIAGNOSIS — E8881 Metabolic syndrome: Secondary | ICD-10-CM | POA: Diagnosis not present

## 2019-07-12 DIAGNOSIS — E669 Obesity, unspecified: Secondary | ICD-10-CM

## 2019-07-12 MED ORDER — AMLODIPINE BESYLATE 10 MG PO TABS: 10.0000 mg | ORAL_TABLET | Freq: Every day | ORAL | 0 refills | Status: DC

## 2019-07-12 NOTE — Progress Notes (Signed)
Chief Complaint:   OBESITY Jennifer Frazier is here to discuss her progress with her obesity treatment plan along with follow-up of her obesity related diagnoses. Jennifer Frazier is on keeping a food journal and adhering to recommended goals of 1400-1500 calories and 90 grams of protein daily and states she is following her eating plan approximately 25% of the time. Jennifer Frazier states she is walking for 30 minutes 5 times per week.  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 Her mom was hospitalized for 3 days and is now in a skilled nursing facility temporarily for rehab. Jennifer Frazier has not been eating much recently. She is out of her normal routine.  Subjective:   1. Essential hypertension Jennifer Frazier's blood pressure is well controlled on Norvasc. She denies chest pain or shortness of breath.    BP Readings from Last 3 Encounters:  07/12/19 115/79  06/20/19 109/73  05/30/19 109/75   Lab Results  Component Value Date   CREATININE 0.97 04/18/2019   CREATININE 0.98 11/10/2018   CREATININE 0.94 11/04/2017   2. Insulin resistance Jennifer Frazier is concerned because her mom was diagnosed with diabetes. Last fasting insulin was 6.7 and A1c was 4.9. She continues to work on diet and exercise to decrease her risk of diabetes.  Lab Results  Component Value Date   INSULIN 6.7 04/18/2019   INSULIN 11.0 12/08/2018   INSULIN 13.5 12/16/2017   INSULIN 6.2 06/24/2017   Lab Results  Component Value Date   HGBA1C 4.9 04/18/2019   3. At risk for complication associated with hypotension The patient is at a higher than average risk of hypotension due to weight loss.  Assessment/Plan:   1. Essential hypertension Jennifer Frazier is working on healthy weight loss and exercise to improve blood pressure  control. We will watch for signs of hypotension as she continues her lifestyle modifications. We will refill Norvasc for 1 month.  - amLODipine (NORVASC) 10 MG tablet; Take 1 tablet (10 mg total) by mouth daily.  Dispense: 30 tablet; Refill: 0  2. Insulin resistance Jennifer Frazier will continue her meal plan, and will continue to work on weight loss, exercise, and decreasing simple carbohydrates to help decrease the risk of diabetes. Jennifer Frazier agreed to follow-up with [name] as directed to closely monitor her progress.  3. At risk for complication associated with hypotension Jennifer Frazier was given approximately 15 minutes of education and counseling today to help avoid hypotension. We discussed risks of hypotension with weight loss and signs of hypotension such as feeling lightheaded or unsteady.  Repetitive spaced learning was employed today to elicit superior memory formation and behavioral change.  4. Class 1 obesity with serious comorbidity and body mass index (BMI) of 31.0 to 31.9 in adult, unspecified obesity type Jennifer Frazier is currently in the action stage of change. As such, her goal is to continue with weight loss efforts. She has agreed to keeping a food journal and adhering to recommended goals of 1400-1500 calories and 90 grams of protein daily.   Jennifer Frazier will get back to journaling regularly.  Exercise goals: As is.  Behavioral modification strategies: increasing lean protein intake, no skipping meals and keeping a strict food journal.  Jennifer Frazier has agreed to follow-up with our clinic in 3 weeks. She was informed of the importance of frequent  follow-up visits to maximize her success with intensive lifestyle modifications for her multiple health conditions.   Objective:   Blood pressure 115/79, pulse 79, temperature 98.2 F (36.8 C), temperature source Oral, height 5\' 9"  (1.753 m), weight 214 lb (97.1 kg), last menstrual period 09/03/2017, SpO2 99 %. Body mass index is 31.6 kg/m.  General: Cooperative,  alert, well developed, in no acute distress. HEENT: Conjunctivae and lids unremarkable. Cardiovascular: Regular rhythm.  Lungs: Normal work of breathing. Neurologic: No focal deficits.   Lab Results  Component Value Date   CREATININE 0.97 04/18/2019   BUN 20 04/18/2019   NA 142 04/18/2019   K 3.9 04/18/2019   CL 104 04/18/2019   CO2 23 04/18/2019   Lab Results  Component Value Date   ALT 13 04/18/2019   AST 22 04/18/2019   ALKPHOS 95 04/18/2019   BILITOT 0.3 04/18/2019   Lab Results  Component Value Date   HGBA1C 4.9 04/18/2019   HGBA1C 5.0 12/08/2018   HGBA1C 5.1 12/16/2017   HGBA1C 5.3 06/24/2017   Lab Results  Component Value Date   INSULIN 6.7 04/18/2019   INSULIN 11.0 12/08/2018   INSULIN 13.5 12/16/2017   INSULIN 6.2 06/24/2017   Lab Results  Component Value Date   TSH 1.600 04/18/2019   Lab Results  Component Value Date   CHOL 142 04/18/2019   HDL 82 04/18/2019   LDLCALC 47 04/18/2019   TRIG 62 04/18/2019   Lab Results  Component Value Date   WBC 5.9 11/10/2018   HGB 13.7 11/10/2018   HCT 41.1 11/10/2018   MCV 84.9 11/10/2018   PLT 196 11/10/2018   No results found for: IRON, TIBC, FERRITIN  Attestation Statements:   Reviewed by clinician on day of visit: allergies, medications, problem list, medical history, surgical history, family history, social history, and previous encounter notes.   Wilhemena Durie, am acting as Location manager for [employer], FNP-C.  I have reviewed the above documentation for accuracy and completeness, and I agree with the above. -  Georgianne Fick, FNP

## 2019-08-02 ENCOUNTER — Encounter (INDEPENDENT_AMBULATORY_CARE_PROVIDER_SITE_OTHER): Payer: Self-pay | Admitting: Family Medicine

## 2019-08-02 ENCOUNTER — Other Ambulatory Visit: Payer: Self-pay

## 2019-08-02 ENCOUNTER — Ambulatory Visit (INDEPENDENT_AMBULATORY_CARE_PROVIDER_SITE_OTHER): Payer: 59 | Admitting: Family Medicine

## 2019-08-02 VITALS — BP 116/77 | HR 75 | Temp 98.2°F | Ht 69.0 in | Wt 211.0 lb

## 2019-08-02 DIAGNOSIS — E8881 Metabolic syndrome: Secondary | ICD-10-CM | POA: Diagnosis not present

## 2019-08-02 DIAGNOSIS — Z9189 Other specified personal risk factors, not elsewhere classified: Secondary | ICD-10-CM | POA: Diagnosis not present

## 2019-08-02 DIAGNOSIS — Z6831 Body mass index (BMI) 31.0-31.9, adult: Secondary | ICD-10-CM

## 2019-08-02 DIAGNOSIS — I10 Essential (primary) hypertension: Secondary | ICD-10-CM

## 2019-08-02 DIAGNOSIS — E669 Obesity, unspecified: Secondary | ICD-10-CM | POA: Diagnosis not present

## 2019-08-02 MED ORDER — AMLODIPINE BESYLATE 10 MG PO TABS: 10.0000 mg | ORAL_TABLET | Freq: Every day | ORAL | 0 refills | Status: DC

## 2019-08-07 NOTE — Progress Notes (Signed)
Chief Complaint:   OBESITY Jennifer Frazier is here to discuss her progress with her obesity treatment plan along with follow-up of her obesity related diagnoses. Jennifer Frazier is on keeping a food journal and adhering to recommended goals of 1400-1500 calories and 90 grams of protein daily and states she is following her eating plan approximately 50% of the time. Jennifer Frazier states she is doing 0 minutes 0 times per week.  Today's visit was #: 68 Starting weight: 294 lbs Starting date: 06/24/2017 Today's weight: 211 lbs Today's date: 08/02/2019 Total lbs lost to date: 83 Total lbs lost since last in-office visit: 3  Interim History: Jennifer Frazier is still struggling to get back to her routine. Her mom has been in a SNF. Jennifer Frazier has been journaling sporadically and has not been exercising. She has gone over on her calories. She would like to eat less carbohydrates because her mom is a diabetic and she eats what Jennifer Frazier eats as she lives with her.  Subjective:   1. Essential hypertension Jennifer Frazier's blood pressure is well controlled on Norvasc. Cardiovascular ROS: no chest pain or dyspnea on exertion.  BP Readings from Last 3 Encounters:  08/02/19 116/77  07/12/19 115/79  06/20/19 109/73   Lab Results  Component Value Date   CREATININE 0.97 04/18/2019   CREATININE 0.98 11/10/2018   CREATININE 0.94 11/04/2017   2. Insulin resistance Jennifer Frazier has a diagnosis of insulin resistance based on her elevated fasting insulin level >5. She is on metformin, but she is not taking metformin regularly. She continues to work on diet and exercise to decrease her risk of diabetes.  Lab Results  Component Value Date   INSULIN 6.7 04/18/2019   INSULIN 11.0 12/08/2018   INSULIN 13.5 12/16/2017   INSULIN 6.2 06/24/2017   Lab Results  Component Value Date   HGBA1C 4.9 04/18/2019   3. At risk for diabetes mellitus Jennifer Frazier is at higher than average risk for developing diabetes due to her obesity, family history of DM,  and insulin  resistance.   Assessment/Plan:   1. Essential hypertension Jennifer Frazier is working on healthy weight loss and exercise to improve blood pressure control. We will watch for signs of hypotension as she continues her lifestyle modifications. We will refill Norvasc for 1 month.  - amLODipine (NORVASC) 10 MG tablet; Take 1 tablet (10 mg total) by mouth daily.  Dispense: 30 tablet; Refill: 0  2. Insulin resistance Jennifer Frazier will continue metformin, and will continue to work on weight loss, exercise, and decreasing simple carbohydrates to help decrease the risk of diabetes. Jennifer Frazier agreed to follow-up with [name] as directed to closely monitor her progress.  3. At risk for diabetes mellitus Jennifer Frazier was given approximately 15 minutes of diabetes education and counseling today. We discussed intensive lifestyle modifications today with an emphasis on weight loss as well as increasing exercise and decreasing simple carbohydrates in her diet. We also reviewed medication options with an emphasis on risk versus benefit of those discussed.   Repetitive spaced learning was employed today to elicit superior memory formation and behavioral change.  4. Class 1 obesity with serious comorbidity and body mass index (BMI) of 31.0 to 31.9 in adult, unspecified obesity type Jennifer Frazier is currently in the action stage of change. As such, her goal is to continue with weight loss efforts. She has agreed to the Category 3 Plan or keeping a food journal and adhering to recommended goals of 1400-1500 calories and 90 grams of protein daily.   Exercise goals: All  adults should avoid inactivity. Some physical activity is better than none, and adults who participate in any amount of physical activity gain some health benefits.  Behavioral modification strategies: increasing lean protein intake, decreasing simple carbohydrates and planning for success.  Jennifer Frazier has agreed to follow-up with our clinic in 3 weeks. She was informed of the importance of  frequent follow-up visits to maximize her success with intensive lifestyle modifications for her multiple health conditions.   Objective:   Blood pressure 116/77, pulse 75, temperature 98.2 F (36.8 C), temperature source Oral, height 5\' 9"  (1.753 m), weight 211 lb (95.7 kg), last menstrual period 09/03/2017, SpO2 98 %. Body mass index is 31.16 kg/m.  General: Cooperative, alert, well developed, in no acute distress. HEENT: Conjunctivae and lids unremarkable. Cardiovascular: Regular rhythm.  Lungs: Normal work of breathing. Neurologic: No focal deficits.   Lab Results  Component Value Date   CREATININE 0.97 04/18/2019   BUN 20 04/18/2019   NA 142 04/18/2019   K 3.9 04/18/2019   CL 104 04/18/2019   CO2 23 04/18/2019   Lab Results  Component Value Date   ALT 13 04/18/2019   AST 22 04/18/2019   ALKPHOS 95 04/18/2019   BILITOT 0.3 04/18/2019   Lab Results  Component Value Date   HGBA1C 4.9 04/18/2019   HGBA1C 5.0 12/08/2018   HGBA1C 5.1 12/16/2017   HGBA1C 5.3 06/24/2017   Lab Results  Component Value Date   INSULIN 6.7 04/18/2019   INSULIN 11.0 12/08/2018   INSULIN 13.5 12/16/2017   INSULIN 6.2 06/24/2017   Lab Results  Component Value Date   TSH 1.600 04/18/2019   Lab Results  Component Value Date   CHOL 142 04/18/2019   HDL 82 04/18/2019   LDLCALC 47 04/18/2019   TRIG 62 04/18/2019   Lab Results  Component Value Date   WBC 5.9 11/10/2018   HGB 13.7 11/10/2018   HCT 41.1 11/10/2018   MCV 84.9 11/10/2018   PLT 196 11/10/2018   No results found for: IRON, TIBC, FERRITIN  Attestation Statements:   Reviewed by clinician on day of visit: allergies, medications, problem list, medical history, surgical history, family history, social history, and previous encounter notes.   Wilhemena Durie, am acting as Location manager for [employer], FNP-C.  I have reviewed the above documentation for accuracy and completeness, and I agree with the above. -  Georgianne Fick, FNP

## 2019-08-08 ENCOUNTER — Encounter (INDEPENDENT_AMBULATORY_CARE_PROVIDER_SITE_OTHER): Payer: Self-pay | Admitting: Family Medicine

## 2019-08-28 ENCOUNTER — Encounter (INDEPENDENT_AMBULATORY_CARE_PROVIDER_SITE_OTHER): Payer: Self-pay | Admitting: Family Medicine

## 2019-08-28 ENCOUNTER — Other Ambulatory Visit: Payer: Self-pay

## 2019-08-28 ENCOUNTER — Ambulatory Visit (INDEPENDENT_AMBULATORY_CARE_PROVIDER_SITE_OTHER): Payer: 59 | Admitting: Family Medicine

## 2019-08-28 VITALS — BP 121/80 | HR 68 | Temp 97.8°F | Ht 69.0 in | Wt 209.0 lb

## 2019-08-28 DIAGNOSIS — E8881 Metabolic syndrome: Secondary | ICD-10-CM | POA: Diagnosis not present

## 2019-08-28 DIAGNOSIS — I10 Essential (primary) hypertension: Secondary | ICD-10-CM

## 2019-08-28 DIAGNOSIS — E669 Obesity, unspecified: Secondary | ICD-10-CM

## 2019-08-28 DIAGNOSIS — E66811 Obesity, class 1: Secondary | ICD-10-CM

## 2019-08-28 DIAGNOSIS — E88819 Insulin resistance, unspecified: Secondary | ICD-10-CM

## 2019-08-28 DIAGNOSIS — Z9189 Other specified personal risk factors, not elsewhere classified: Secondary | ICD-10-CM

## 2019-08-28 DIAGNOSIS — Z6830 Body mass index (BMI) 30.0-30.9, adult: Secondary | ICD-10-CM

## 2019-08-28 MED ORDER — AMLODIPINE BESYLATE 10 MG PO TABS: 10.0000 mg | ORAL_TABLET | Freq: Every day | ORAL | 0 refills | Status: DC

## 2019-08-28 NOTE — Progress Notes (Signed)
Chief Complaint:   OBESITY Jennifer Frazier is here to discuss her progress with her obesity treatment plan along with follow-up of her obesity related diagnoses. Tiwana is on the Category 3 Plan or keeping a food journal and adhering to recommended goals of 1400-1500 calories and 90 grams of protein daily and states she is following her eating plan approximately 70% of the time. Anet states she is doing 0 minutes 0 times per week.  Today's visit was #: 68 Starting weight: 294 lbs Starting date: 06/24/2017 Today's weight: 209 lbs Today's date: 08/28/2019 Total lbs lost to date: 85 Total lbs lost since last in-office visit: 2  Interim History: [name] has done a great job on the plan overall losing 85 lbs over the past 2 years. [name] has been journaling inconsistently. She is not eating enough protein. She is her mother's caregiver. She reports preparing meals for her mother and then not feeling like fixing herself a meal. She has lost 22 lbs of muscle since starting the plan.  Subjective:   1. Essential hypertension Sharlot's blood pressure is well controlled on Norvasc 10 mg.  BP Readings from Last 3 Encounters:  08/28/19 121/80  08/02/19 116/77  07/12/19 115/79   Lab Results  Component Value Date   CREATININE 0.97 04/18/2019   CREATININE 0.98 11/10/2018   CREATININE 0.94 11/04/2017   2. Insulin resistance Briannah has a diagnosis of insulin resistance based on her elevated fasting insulin level >5. She denies polyphagia, and she is on metformin daily. She continues to work on diet and exercise to decrease her risk of diabetes.  Lab Results  Component Value Date   INSULIN 6.7 04/18/2019   INSULIN 11.0 12/08/2018   INSULIN 13.5 12/16/2017   INSULIN 6.2 06/24/2017   Lab Results  Component Value Date   HGBA1C 4.9 04/18/2019   3. At risk for malnutrition [name] is at increased risk for malnutrition due to protein deficiency.  Assessment/Plan:   1. Essential hypertension Jennifer Frazier is  working on healthy weight loss and exercise to improve blood pressure control. We will watch for signs of hypotension as she continues her lifestyle modifications. We will refill amlodipine for 1 month.  - amLODipine (NORVASC) 10 MG tablet; Take 1 tablet (10 mg total) by mouth daily.  Dispense: 30 tablet; Refill: 0  2. Insulin resistance [name] will continue her meal plan, and will continue to work on weight loss, exercise, and decreasing simple carbohydrates to help decrease the risk of diabetes. [name] agreed to follow-up with [name] as directed to closely monitor her progress.  3. At risk for malnutrition [name] was given approximately 15 minutes of counseling today regarding prevention of malnutrition and ways to meet macronutrient goals.   4. Class 1 obesity with serious comorbidity and body mass index (BMI) of 30.0 to 30.9 in adult, unspecified obesity type [name] is currently in the action stage of change. As such, her goal is to continue with weight loss efforts. She has agreed to keeping a food journal and adhering to recommended goals of 1400-1500 calories and 90 grams of protein daily.   Handout given today: Protein Content. [name] is to weigh her meat.  Exercise goals: All adults should avoid inactivity. Some physical activity is better than none, and adults who participate in any amount of physical activity gain some health benefits.  Behavioral modification strategies: increasing lean protein intake and meal planning and cooking strategies.  [name] has agreed to follow-up with our clinic in 3 weeks. She was  informed of the importance of frequent follow-up visits to maximize her success with intensive lifestyle modifications for her multiple health conditions.   Objective:   Blood pressure 121/80, pulse 68, temperature 97.8 F (36.6 C), temperature source Oral, height 5\' 9"  (1.753 m), weight (!) 209 lb (94.8 kg), last menstrual period 09/03/2017, SpO2 100 %. Body mass index is 30.86  kg/m.  General: Cooperative, alert, well developed, in no acute distress. HEENT: Conjunctivae and lids unremarkable. Cardiovascular: Regular rhythm.  Lungs: Normal work of breathing. Neurologic: No focal deficits.   Lab Results  Component Value Date   CREATININE 0.97 04/18/2019   BUN 20 04/18/2019   NA 142 04/18/2019   K 3.9 04/18/2019   CL 104 04/18/2019   CO2 23 04/18/2019   Lab Results  Component Value Date   ALT 13 04/18/2019   AST 22 04/18/2019   ALKPHOS 95 04/18/2019   BILITOT 0.3 04/18/2019   Lab Results  Component Value Date   HGBA1C 4.9 04/18/2019   HGBA1C 5.0 12/08/2018   HGBA1C 5.1 12/16/2017   HGBA1C 5.3 06/24/2017   Lab Results  Component Value Date   INSULIN 6.7 04/18/2019   INSULIN 11.0 12/08/2018   INSULIN 13.5 12/16/2017   INSULIN 6.2 06/24/2017   Lab Results  Component Value Date   TSH 1.600 04/18/2019   Lab Results  Component Value Date   CHOL 142 04/18/2019   HDL 82 04/18/2019   LDLCALC 47 04/18/2019   TRIG 62 04/18/2019   Lab Results  Component Value Date   WBC 5.9 11/10/2018   HGB 13.7 11/10/2018   HCT 41.1 11/10/2018   MCV 84.9 11/10/2018   PLT 196 11/10/2018   No results found for: IRON, TIBC, FERRITIN  Attestation Statements:   Reviewed by clinician on day of visit: allergies, medications, problem list, medical history, surgical history, family history, social history, and previous encounter notes.   Wilhemena Durie, am acting as Location manager for [employer], FNP-C.  I have reviewed the above documentation for accuracy and completeness, and I agree with the above. -  Georgianne Fick, FNP

## 2019-09-05 ENCOUNTER — Other Ambulatory Visit: Payer: Self-pay | Admitting: Oncology

## 2019-09-05 DIAGNOSIS — Z1231 Encounter for screening mammogram for malignant neoplasm of breast: Secondary | ICD-10-CM

## 2019-09-14 ENCOUNTER — Encounter: Payer: Self-pay | Admitting: Gastroenterology

## 2019-09-18 ENCOUNTER — Other Ambulatory Visit: Payer: Self-pay

## 2019-09-18 ENCOUNTER — Ambulatory Visit (INDEPENDENT_AMBULATORY_CARE_PROVIDER_SITE_OTHER): Payer: 59 | Admitting: Family Medicine

## 2019-09-18 ENCOUNTER — Encounter (INDEPENDENT_AMBULATORY_CARE_PROVIDER_SITE_OTHER): Payer: Self-pay | Admitting: Family Medicine

## 2019-09-18 VITALS — BP 116/81 | HR 74 | Temp 97.8°F | Ht 69.0 in | Wt 208.0 lb

## 2019-09-18 DIAGNOSIS — I10 Essential (primary) hypertension: Secondary | ICD-10-CM

## 2019-09-18 DIAGNOSIS — Z9189 Other specified personal risk factors, not elsewhere classified: Secondary | ICD-10-CM

## 2019-09-18 DIAGNOSIS — E669 Obesity, unspecified: Secondary | ICD-10-CM

## 2019-09-18 DIAGNOSIS — E8881 Metabolic syndrome: Secondary | ICD-10-CM | POA: Diagnosis not present

## 2019-09-18 DIAGNOSIS — Z6830 Body mass index (BMI) 30.0-30.9, adult: Secondary | ICD-10-CM

## 2019-09-18 MED ORDER — AMLODIPINE BESYLATE 10 MG PO TABS: 10.0000 mg | ORAL_TABLET | Freq: Every day | ORAL | 0 refills | Status: DC

## 2019-09-18 NOTE — Progress Notes (Signed)
Chief Complaint:   OBESITY Jennifer Frazier is here to discuss her progress with her obesity treatment plan along with follow-up of her obesity related diagnoses. Jennifer Frazier is on keeping a food journal and adhering to recommended goals of 1400-1500 calories and 90 grams of protein daily and states she is following her eating plan approximately 80% of the time. Jennifer Frazier states she is doing 0 minutes 0 times per week.  Today's visit was #: 74 Starting weight: 294 lbs Starting date: 06/24/2017 Today's weight: 208 lbs Today's date: 09/18/2019 Total lbs lost to date: 86 Total lbs lost since last in-office visit: 1  Interim History: Jennifer Frazier is working hard to care for her mom and work full time. She has increased her protein in the morning. She is journaling but she is not keeping track of her protein. Her calories average 1500 per day.  Subjective:   1. Essential hypertension Jennifer Frazier's blood pressure is well controlled on amlodipine. Cardiovascular ROS: no chest pain or dyspnea on exertion.  BP Readings from Last 3 Encounters:  09/18/19 116/81  08/28/19 121/80  08/02/19 116/77   Lab Results  Component Value Date   CREATININE 0.97 04/18/2019   CREATININE 0.98 11/10/2018   CREATININE 0.94 11/04/2017   2. Insulin resistance Jennifer Frazier has a diagnosis of insulin resistance based on her elevated fasting insulin level >5. She denies polyphagia, and she is not on metformin. She continues to work on diet and exercise to decrease her risk of diabetes.  Lab Results  Component Value Date   INSULIN 6.7 04/18/2019   INSULIN 11.0 12/08/2018   INSULIN 13.5 12/16/2017   INSULIN 6.2 06/24/2017   Lab Results  Component Value Date   HGBA1C 4.9 04/18/2019   3. At risk for malnutrition Jennifer Frazier is at increased risk for malnutrition due to lack of protein.  Assessment/Plan:   1. Essential hypertension Jennifer Frazier is working on healthy weight loss and exercise to improve blood pressure control. We will watch for signs of  hypotension as she continues her lifestyle modifications. We will refill amlodipine for 1 month.  - amLODipine (NORVASC) 10 MG tablet; Take 1 tablet (10 mg total) by mouth daily.  Dispense: 30 tablet; Refill: 0  2. Insulin resistance Jennifer Frazier will continue her meal plan, and will continue to work on weight loss, exercise, and decreasing simple carbohydrates to help decrease the risk of diabetes. Jennifer Frazier agreed to follow-up with [name] as directed to closely monitor her progress.  3. At risk for malnutrition Jennifer Frazier was given approximately 15 minutes of counseling today regarding prevention of malnutrition and ways to meet macronutrient goals..   4. Class 1 obesity with serious comorbidity and body mass index (BMI) of 30.0 to 30.9 in adult, unspecified obesity type Jennifer Frazier is currently in the action stage of change. As such, her goal is to continue with weight loss efforts. She has agreed to keeping a food journal and adhering to recommended goals of 1400-1500 calories and 90 grams of protein daily.   Jennifer Frazier is to track her protein.  Exercise goals: Walk, do other cardio, or weights.for 15 minutes 5 times per week, or do  Behavioral modification strategies: increasing lean protein intake and keeping a strict food journal.  Jennifer Frazier has agreed to follow-up with our clinic in 3 weeks. She was informed of the importance of frequent follow-up visits to maximize her success with intensive lifestyle modifications for her multiple health conditions.   Objective:   Blood pressure 116/81, pulse 74, temperature 97.8 F (36.6 C),  temperature source Oral, height 5\' 9"  (1.753 m), weight 208 lb (94.3 kg), last menstrual period 09/03/2017, SpO2 97 %. Body mass index is 30.72 kg/m.  General: Cooperative, alert, well developed, in no acute distress. HEENT: Conjunctivae and lids unremarkable. Cardiovascular: Regular rhythm.  Lungs: Normal work of breathing. Neurologic: No focal deficits.   Lab Results  Component Value  Date   CREATININE 0.97 04/18/2019   BUN 20 04/18/2019   NA 142 04/18/2019   K 3.9 04/18/2019   CL 104 04/18/2019   CO2 23 04/18/2019   Lab Results  Component Value Date   ALT 13 04/18/2019   AST 22 04/18/2019   ALKPHOS 95 04/18/2019   BILITOT 0.3 04/18/2019   Lab Results  Component Value Date   HGBA1C 4.9 04/18/2019   HGBA1C 5.0 12/08/2018   HGBA1C 5.1 12/16/2017   HGBA1C 5.3 06/24/2017   Lab Results  Component Value Date   INSULIN 6.7 04/18/2019   INSULIN 11.0 12/08/2018   INSULIN 13.5 12/16/2017   INSULIN 6.2 06/24/2017   Lab Results  Component Value Date   TSH 1.600 04/18/2019   Lab Results  Component Value Date   CHOL 142 04/18/2019   HDL 82 04/18/2019   LDLCALC 47 04/18/2019   TRIG 62 04/18/2019   Lab Results  Component Value Date   WBC 5.9 11/10/2018   HGB 13.7 11/10/2018   HCT 41.1 11/10/2018   MCV 84.9 11/10/2018   PLT 196 11/10/2018   No results found for: IRON, TIBC, FERRITIN  Attestation Statements:   Reviewed by clinician on day of visit: allergies, medications, problem list, medical history, surgical history, family history, social history, and previous encounter notes.   Wilhemena Durie, am acting as Location manager for [employer], FNP-C.  I have reviewed the above documentation for accuracy and completeness, and I agree with the above. -  Georgianne Fick, FNP

## 2019-09-19 ENCOUNTER — Encounter: Payer: Self-pay | Admitting: Podiatry

## 2019-09-19 ENCOUNTER — Ambulatory Visit (INDEPENDENT_AMBULATORY_CARE_PROVIDER_SITE_OTHER): Payer: 59 | Admitting: Podiatry

## 2019-09-19 VITALS — BP 148/89 | HR 87 | Temp 97.1°F | Resp 16

## 2019-09-19 DIAGNOSIS — M2011 Hallux valgus (acquired), right foot: Secondary | ICD-10-CM

## 2019-09-19 DIAGNOSIS — Q66222 Congenital metatarsus adductus, left foot: Secondary | ICD-10-CM

## 2019-09-19 DIAGNOSIS — L84 Corns and callosities: Secondary | ICD-10-CM

## 2019-09-19 DIAGNOSIS — M2012 Hallux valgus (acquired), left foot: Secondary | ICD-10-CM

## 2019-09-19 DIAGNOSIS — M21612 Bunion of left foot: Secondary | ICD-10-CM

## 2019-09-19 DIAGNOSIS — Q66221 Congenital metatarsus adductus, right foot: Secondary | ICD-10-CM

## 2019-09-19 DIAGNOSIS — M21862 Other specified acquired deformities of left lower leg: Secondary | ICD-10-CM

## 2019-09-19 DIAGNOSIS — M21861 Other specified acquired deformities of right lower leg: Secondary | ICD-10-CM

## 2019-09-19 DIAGNOSIS — M2042 Other hammer toe(s) (acquired), left foot: Secondary | ICD-10-CM | POA: Diagnosis not present

## 2019-09-19 DIAGNOSIS — L600 Ingrowing nail: Secondary | ICD-10-CM | POA: Diagnosis not present

## 2019-09-19 DIAGNOSIS — M216X1 Other acquired deformities of right foot: Secondary | ICD-10-CM

## 2019-09-19 DIAGNOSIS — D259 Leiomyoma of uterus, unspecified: Secondary | ICD-10-CM | POA: Insufficient documentation

## 2019-09-19 DIAGNOSIS — M2142 Flat foot [pes planus] (acquired), left foot: Secondary | ICD-10-CM

## 2019-09-19 DIAGNOSIS — M2041 Other hammer toe(s) (acquired), right foot: Secondary | ICD-10-CM

## 2019-09-19 DIAGNOSIS — M21611 Bunion of right foot: Secondary | ICD-10-CM

## 2019-09-19 DIAGNOSIS — M216X2 Other acquired deformities of left foot: Secondary | ICD-10-CM

## 2019-09-19 DIAGNOSIS — M2141 Flat foot [pes planus] (acquired), right foot: Secondary | ICD-10-CM

## 2019-09-19 MED ORDER — CORTISPORIN-TC 3.3-3-10-0.5 MG/ML OT SUSP: OTIC | 0 refills | Status: DC

## 2019-09-19 NOTE — Progress Notes (Signed)
Subjective:  Patient ID: Jennifer Frazier, female    DOB: [birth year],  MRN: [id number]  Chief Complaint  Patient presents with  . Nail Issue or Skin Issue?    Right Foot; 2nd toe-medial side; pt stated, "The skin by the nail feels sore; I think my Right foot, big toenail is darker"; x2 months  . Callouses    Bilateral; plantar forefoot; Left foot-submet 2, 4, and 5; Right foot-submet 2 and 5    44 y.o. female presents with the above complaint. History confirmed with patient.   Objective:  Physical Exam: warm, good capillary refill, no trophic changes or ulcerative lesions, normal DP and PT pulses and normal sensory exam.  Bilaterally she has hallux valgus with bunions, mild hammertoes, tailor's bunions and prominent fifth metatarsal bases, suspect metatarsus adductus is present as well.  Bilateral mild pes planus.  Calluses submet 2, 5 bilaterally the right second toenail is somewhat rotated with hyperkeratosis and what appears to be a listers corn on the medial side with an ingrown medial border.  No paronychia  Radiographs: X-ray of both feet: We will obtain at next visit if indicated Assessment:   1. Hallux valgus with bunions, left   2. Hallux valgus with bunions, right   3. Hammertoe of left foot   4. Hammertoe of right foot   5. Callus of foot   6. Pes planus of both feet   7. Gastrocnemius equinus of left lower extremity   8. Gastrocnemius equinus of right lower extremity   9. Metatarsus adductus of both feet   10. Ingrown toenail of right foot      Plan:  Patient was evaluated and treated and all questions answered.   -Discussed with her that the majority of her calluses appear to be secondary to her foot deformities including flexible pes planus, bilateral hallux valgus and metatarsus adductus and I suspect that the fifth and second metatarsals are taking the majority of the weight and pressure.  She also has mild hammertoes of the second toes which are contributing  this.  -We discussed shoe gear and inserts.  She will discuss with her insurance if they will cover custom molded orthotics.  I believe she would benefit greatly from these including some that has an intrinsic metatarsal pad.  I dispensed disposable metatarsal pad today that she will trial in her current shoes to see if this offloads the pain from the calluses.  Also recommended urea cream to be applied to the calluses daily.  -We discussed the above deformities as well and if the orthotics are not successful we will consider surgical reconstruction of the forefoot with correction of the hallux valgus and bunionectomy, tailor's bunionectomy and potential correction of metatarsal deformities as indicated.  X-rays were not taken today we will take these in the future if surgery is planned.  -For the right second toenail, it appears she does have a mild ingrowing nail that is causing a listers corn on the medial nail fold.  I recommended that we excise this today and a matricectomy performed to get rid of the ingrown nail   Ingrown Nail, right -Patient elects to proceed with minor surgery to remove ingrown toenail today. Consent reviewed and signed by patient. -Ingrown nail excised. See procedure note. -Educated on post-procedure care including soaking. Written instructions provided and reviewed. -Patient to follow up in 2 weeks for nail check.  Procedure: Excision of Ingrown Toenail Location: Right 2nd toe medial nail borders. Anesthesia: Lidocaine 1% plain; 1.5 mL  and Marcaine 0.5% plain; 1.5 mL, digital block. Skin Prep: Betadine. Dressing: Silvadene; telfa; dry, sterile, compression dressing. Technique: Following skin prep, the toe was exsanguinated and a tourniquet was secured at the base of the toe. The affected nail border was freed, split with a nail splitter, and excised. Chemical matrixectomy was then performed with phenol and irrigated out with alcohol. The tourniquet was then removed and  sterile dressing applied. Disposition: Patient tolerated procedure well. Patient to return in 2 weeks for follow-up.    Return in about 2 weeks (around 10/03/2019) for nail re-check.

## 2019-09-19 NOTE — Patient Instructions (Signed)
Check with your insurance if they cover "custom molded orthotics" or "CMOs"     Place 1/4 cup of epsom salts in a quart of warm tap water.  Submerge your foot or feet in the solution and soak for 20 minutes.  This soak should be done twice a day.  Next, remove your foot or feet from solution, blot dry the affected area. Apply ointment and cover if instructed by your doctor.   IF YOUR SKIN BECOMES IRRITATED WHILE USING THESE INSTRUCTIONS, IT IS OKAY TO SWITCH TO  WHITE VINEGAR AND WATER.  As another alternative soak, you may use antibacterial soap and water.  Monitor for any signs/symptoms of infection. Call the office immediately if any occur or go directly to the emergency room. Call with any questions/concerns.

## 2019-10-03 ENCOUNTER — Other Ambulatory Visit: Payer: Self-pay

## 2019-10-03 ENCOUNTER — Ambulatory Visit (AMBULATORY_SURGERY_CENTER): Payer: Self-pay

## 2019-10-03 VITALS — Ht 69.0 in | Wt 213.6 lb

## 2019-10-03 DIAGNOSIS — Z800 Family history of malignant neoplasm of digestive organs: Secondary | ICD-10-CM

## 2019-10-03 DIAGNOSIS — Z1211 Encounter for screening for malignant neoplasm of colon: Secondary | ICD-10-CM

## 2019-10-03 MED ORDER — PLENVU 140 G PO SOLR: 1.0000 | ORAL | 0 refills | Status: DC

## 2019-10-03 NOTE — Progress Notes (Signed)
No egg or soy allergy known to patient  No issues with past sedation with any surgeries or procedures No intubation problems in the past  No FH of Malignant Hyperthermia No diet pills per patient No home 02 use per patient  No blood thinners per patient  Pt denies issues with constipation  No A fib or A flutter  EMMI video via MyChart  COVID 19 guidelines implemented in [facility] today with Pt and RN  Coupon given to pt in [facility] today , Code to Pharmacy  COVID vaccines completed on 04/2019 per pt;  Due to the COVID-19 pandemic we are asking patients to follow these guidelines. Please only bring one care partner. Please be aware that your care partner may wait in the car in the parking lot or if they feel like they will be too hot to wait in the car, they may wait in the lobby on the 4th floor. All care partners are required to wear a mask the entire time (we do not have any that we can provide them), they need to practice social distancing, and we will do a Covid check for all patient's and care partners when you arrive. Also we will check their temperature and your temperature. If the care partner waits in their car they need to stay in the parking lot the entire time and we will call them on their cell phone when the patient is ready for discharge so they can bring the car to the front of the building. Also all patient's will need to wear a mask into building.  

## 2019-10-04 ENCOUNTER — Encounter: Payer: Self-pay | Admitting: Gastroenterology

## 2019-10-05 ENCOUNTER — Other Ambulatory Visit: Payer: Self-pay

## 2019-10-05 ENCOUNTER — Ambulatory Visit (INDEPENDENT_AMBULATORY_CARE_PROVIDER_SITE_OTHER): Payer: 59 | Admitting: Podiatry

## 2019-10-05 DIAGNOSIS — Q66222 Congenital metatarsus adductus, left foot: Secondary | ICD-10-CM

## 2019-10-05 DIAGNOSIS — M216X2 Other acquired deformities of left foot: Secondary | ICD-10-CM | POA: Diagnosis not present

## 2019-10-05 DIAGNOSIS — M2042 Other hammer toe(s) (acquired), left foot: Secondary | ICD-10-CM | POA: Diagnosis not present

## 2019-10-05 DIAGNOSIS — M2041 Other hammer toe(s) (acquired), right foot: Secondary | ICD-10-CM

## 2019-10-05 DIAGNOSIS — L600 Ingrowing nail: Secondary | ICD-10-CM

## 2019-10-05 DIAGNOSIS — M2142 Flat foot [pes planus] (acquired), left foot: Secondary | ICD-10-CM

## 2019-10-05 DIAGNOSIS — Q66221 Congenital metatarsus adductus, right foot: Secondary | ICD-10-CM

## 2019-10-05 DIAGNOSIS — M2141 Flat foot [pes planus] (acquired), right foot: Secondary | ICD-10-CM

## 2019-10-05 DIAGNOSIS — M2012 Hallux valgus (acquired), left foot: Secondary | ICD-10-CM

## 2019-10-05 DIAGNOSIS — M21862 Other specified acquired deformities of left lower leg: Secondary | ICD-10-CM

## 2019-10-05 DIAGNOSIS — M21861 Other specified acquired deformities of right lower leg: Secondary | ICD-10-CM

## 2019-10-05 DIAGNOSIS — M21612 Bunion of left foot: Secondary | ICD-10-CM

## 2019-10-05 DIAGNOSIS — M216X1 Other acquired deformities of right foot: Secondary | ICD-10-CM

## 2019-10-05 DIAGNOSIS — M2011 Hallux valgus (acquired), right foot: Secondary | ICD-10-CM

## 2019-10-05 DIAGNOSIS — M21611 Bunion of right foot: Secondary | ICD-10-CM

## 2019-10-05 NOTE — Progress Notes (Signed)
  Subjective:  Patient ID: Jennifer Frazier, female    DOB: 05-Sep-1975,  MRN: [id number]  Chief Complaint  Patient presents with  . Nail Problem    pt is here for a nail check of the right second toenail, pt states that she is feeling better overall, but has a slight "twinge" pain here and there, pt also states that she has been taking epsom salts, as well as topical drops as well.    44 y.o. female returns with the above complaint. History confirmed with patient. She has been using the metatarsal pads in her shoes as well and this has been helpful. Her insurance will not cover orthotics. She would be interested in pre-fabricated OTC inserts.  Objective:  Physical Exam: warm, good capillary refill, no trophic changes or ulcerative lesions, normal DP and PT pulses and normal sensory exam.  Bilaterally she has hallux valgus with bunions, mild hammertoes, tailor's bunions and prominent fifth metatarsal bases, suspect metatarsus adductus is present as well.  Bilateral mild pes planus.  Calluses submet 2, 5 bilaterally   R 2nd toenail avulsion site well healed   Assessment:   1. Hallux valgus with bunions, left   2. Hallux valgus with bunions, right   3. Hammertoe of left foot   4. Hammertoe of right foot   5. Pes planus of both feet   6. Gastrocnemius equinus of left lower extremity   7. Gastrocnemius equinus of right lower extremity   8. Metatarsus adductus of both feet   9. Ingrown toenail of right foot      Plan:  Patient was evaluated and treated and all questions answered.   -Resume regular bathing and activity regarding the R 2nd toe.  -The metatarsal pads have been helpful. She is interested in trying pre-fabricated shoe inserts. [employer] dispensed today. If this is not helpful we will revisit our prior discussion regarding forefoot reconstructive surgery with bunionectomy, hammertoe correction and tailor's bunionectomy.    Return in about 3 months (around 01/04/2020).

## 2019-10-10 ENCOUNTER — Ambulatory Visit
Admission: RE | Admit: 2019-10-10 | Discharge: 2019-10-10 | Disposition: A | Discharge status: Home / Self Care | Payer: 59 | Source: Ambulatory Visit | Attending: Oncology | Admitting: Oncology

## 2019-10-10 ENCOUNTER — Other Ambulatory Visit: Payer: Self-pay

## 2019-10-10 DIAGNOSIS — Z1231 Encounter for screening mammogram for malignant neoplasm of breast: Secondary | ICD-10-CM

## 2019-10-11 ENCOUNTER — Other Ambulatory Visit: Payer: Self-pay

## 2019-10-11 ENCOUNTER — Encounter (INDEPENDENT_AMBULATORY_CARE_PROVIDER_SITE_OTHER): Payer: Self-pay | Admitting: Family Medicine

## 2019-10-11 ENCOUNTER — Ambulatory Visit (INDEPENDENT_AMBULATORY_CARE_PROVIDER_SITE_OTHER): Payer: 59 | Admitting: Family Medicine

## 2019-10-11 VITALS — BP 113/74 | HR 65 | Temp 98.2°F | Ht 69.0 in | Wt 206.0 lb

## 2019-10-11 DIAGNOSIS — I10 Essential (primary) hypertension: Secondary | ICD-10-CM | POA: Diagnosis not present

## 2019-10-11 DIAGNOSIS — G4733 Obstructive sleep apnea (adult) (pediatric): Secondary | ICD-10-CM | POA: Diagnosis not present

## 2019-10-11 DIAGNOSIS — Z6830 Body mass index (BMI) 30.0-30.9, adult: Secondary | ICD-10-CM

## 2019-10-11 DIAGNOSIS — E669 Obesity, unspecified: Secondary | ICD-10-CM

## 2019-10-11 MED ORDER — AMLODIPINE BESYLATE 10 MG PO TABS: 10.0000 mg | ORAL_TABLET | Freq: Every day | ORAL | 0 refills | Status: DC

## 2019-10-11 NOTE — Progress Notes (Signed)
Chief Complaint:   OBESITY Jennifer Frazier is here to discuss her progress with her obesity treatment plan along with follow-up of her obesity related diagnoses. [name] is on keeping a food journal and adhering to recommended goals of 1400-1500 calories and 90 grams of protein daily and states she is following her eating plan approximately 90% of the time. [name] states she is walking for 10 minutes 4 times per week.  Today's visit was #: 59 Starting weight: 294 lbs Starting date: 06/24/2017 Today's weight: 206 lbs Today's date: 10/11/2019 Total lbs lost to date: 88 Total lbs lost since last in-office visit: 2  Interim History: [name] notes she is doing better with protein intake. She is tracking protein now. She does get bored with her food choices even though she is journaling. She has not been exercising much but will try to increase this.  Subjective:   1. Essential hypertension [name]'s blood pressure is well controlled on amlodipine. Cardiovascular ROS: no chest pain or dyspnea on exertion.  BP Readings from Last 3 Encounters:  10/11/19 113/74  09/19/19 (!) 148/89  09/18/19 116/81   Lab Results  Component Value Date   CREATININE 0.97 04/18/2019   CREATININE 0.98 11/10/2018   CREATININE 0.94 11/04/2017   2. OSA (obstructive sleep apnea) Jennifer Frazier has a CPAP but she does not want to wear it. Her last sleep study was last year when she was 42 lbs heavier.  Assessment/Plan:   1. Essential hypertension Refill amlodipine for 1 month.  - amLODipine (NORVASC) 10 MG tablet; Take 1 tablet (10 mg total) by mouth daily.  Dispense: 30 tablet; Refill: 0  2. OSA (obstructive sleep apnea)  May consider repeat sleep study when she reaches her goal weight. For now she is unwilling to wear CPAP.  3. Class 1 obesity with serious comorbidity and body mass index (BMI) of 30.0 to 30.9 in adult, unspecified obesity type Jennifer Frazier is currently in the action stage of change. As such, her goal is to  continue with weight loss efforts. She has agreed to keeping a food journal and adhering to recommended goals of 1400-1500 calories and 90 grams of protein daily.   Exercise goals: Increase duration of exercise and add resistance.  Behavioral modification strategies: meal planning and cooking strategies, better snacking choices and planning for success.  Jennifer Frazier has agreed to follow-up with our clinic in 3 weeks.   Objective:   Blood pressure 113/74, pulse 65, temperature 98.2 F (36.8 C), temperature source Oral, height 5\' 9"  (1.753 m), weight 206 lb (93.4 kg), last menstrual period 09/03/2017, SpO2 99 %. Body mass index is 30.42 kg/m.  General: Cooperative, alert, well developed, in no acute distress. HEENT: Conjunctivae and lids unremarkable. Cardiovascular: Regular rhythm.  Lungs: Normal work of breathing. Neurologic: No focal deficits.   Lab Results  Component Value Date   CREATININE 0.97 04/18/2019   BUN 20 04/18/2019   NA 142 04/18/2019   K 3.9 04/18/2019   CL 104 04/18/2019   CO2 23 04/18/2019   Lab Results  Component Value Date   ALT 13 04/18/2019   AST 22 04/18/2019   ALKPHOS 95 04/18/2019   BILITOT 0.3 04/18/2019   Lab Results  Component Value Date   HGBA1C 4.9 04/18/2019   HGBA1C 5.0 12/08/2018   HGBA1C 5.1 12/16/2017   HGBA1C 5.3 06/24/2017   Lab Results  Component Value Date   INSULIN 6.7 04/18/2019   INSULIN 11.0 12/08/2018   INSULIN 13.5 12/16/2017   INSULIN 6.2  06/24/2017   Lab Results  Component Value Date   TSH 1.600 04/18/2019   Lab Results  Component Value Date   CHOL 142 04/18/2019   HDL 82 04/18/2019   LDLCALC 47 04/18/2019   TRIG 62 04/18/2019   Lab Results  Component Value Date   WBC 5.9 11/10/2018   HGB 13.7 11/10/2018   HCT 41.1 11/10/2018   MCV 84.9 11/10/2018   PLT 196 11/10/2018   No results found for: IRON, TIBC, FERRITIN  Attestation Statements:   Reviewed by clinician on day of visit: allergies, medications,  problem list, medical history, surgical history, family history, social history, and previous encounter notes.   Wilhemena Durie, am acting as Location manager for [employer], FNP-C.  I have reviewed the above documentation for accuracy and completeness, and I agree with the above. - Georgianne Fick, FNP

## 2019-10-12 ENCOUNTER — Encounter (INDEPENDENT_AMBULATORY_CARE_PROVIDER_SITE_OTHER): Payer: Self-pay | Admitting: Family Medicine

## 2019-10-17 ENCOUNTER — Ambulatory Visit (AMBULATORY_SURGERY_CENTER): Payer: 59 | Admitting: Gastroenterology

## 2019-10-17 ENCOUNTER — Encounter: Payer: Self-pay | Admitting: Gastroenterology

## 2019-10-17 ENCOUNTER — Other Ambulatory Visit: Payer: Self-pay

## 2019-10-17 VITALS — BP 119/73 | HR 63 | Temp 96.8°F | Resp 14 | Ht 69.0 in | Wt 213.6 lb

## 2019-10-17 DIAGNOSIS — Z800 Family history of malignant neoplasm of digestive organs: Secondary | ICD-10-CM

## 2019-10-17 DIAGNOSIS — Z1211 Encounter for screening for malignant neoplasm of colon: Secondary | ICD-10-CM | POA: Diagnosis not present

## 2019-10-17 MED ORDER — SODIUM CHLORIDE 0.9 % IV SOLN: 500.0000 mL | Freq: Once | INTRAVENOUS | Status: DC

## 2019-10-17 NOTE — Patient Instructions (Signed)
Your colon was normal and no specimens were taken.    Repeat colonoscopy in 5 years.  YOU HAD AN ENDOSCOPIC PROCEDURE TODAY AT [facility]:   Refer to the procedure report that was given to you for any specific questions about what was found during the examination.  If the procedure report does not answer your questions, please call your gastroenterologist to clarify.  If you requested that your care partner not be given the details of your procedure findings, then the procedure report has been included in a sealed envelope for you to review at your convenience later.  YOU SHOULD EXPECT: Some feelings of bloating in the abdomen. Passage of more gas than usual.  Walking can help get rid of the air that was put into your GI tract during the procedure and reduce the bloating. If you had a lower endoscopy (such as a colonoscopy or flexible sigmoidoscopy) you may notice spotting of blood in your stool or on the toilet paper. If you underwent a bowel prep for your procedure, you may not have a normal bowel movement for a few days.  Please Note:  You might notice some irritation and congestion in your nose or some drainage.  This is from the oxygen used during your procedure.  There is no need for concern and it should clear up in a day or so.  SYMPTOMS TO REPORT IMMEDIATELY:   Following lower endoscopy (colonoscopy or flexible sigmoidoscopy):  Excessive amounts of blood in the stool  Significant tenderness or worsening of abdominal pains  Swelling of the abdomen that is new, acute  Fever of 100F or higher  For urgent or emergent issues, a gastroenterologist can be reached at any hour by calling [phone]. Do not use MyChart messaging for urgent concerns.    DIET:  We do recommend a small meal at first, but then you may proceed to your regular diet.  Drink plenty of fluids but you should avoid alcoholic beverages for 24 hours.  ACTIVITY:  You should plan to take it easy for  the rest of today and you should NOT DRIVE or use heavy machinery until tomorrow (because of the sedation medicines used during the test).    FOLLOW UP: Our staff will call the number listed on your records 48-72 hours following your procedure to check on you and address any questions or concerns that you may have regarding the information given to you following your procedure. If we do not reach you, we will leave a message.  We will attempt to reach you two times.  During this call, we will ask if you have developed any symptoms of COVID 19. If you develop any symptoms (ie: fever, flu-like symptoms, shortness of breath, cough etc.) before then, please call [phone].  If you test positive for Covid 19 in the 2 weeks post procedure, please call and report this information to [name].    If any biopsies were taken you will be contacted by phone or by letter within the next 1-3 weeks.  Please call us at [phone] if you have not heard about the biopsies in 3 weeks.    SIGNATURES/CONFIDENTIALITY: You and/or your care partner have signed paperwork which will be entered into your electronic medical record.  These signatures attest to the fact that that the information above on your After Visit Summary has been reviewed and is understood.  Full responsibility of the confidentiality of this discharge information lies with you and/or your care-partner.

## 2019-10-17 NOTE — Progress Notes (Signed)
pt tolerated well. VSS. awake and to recovery. Report given to RN.  

## 2019-10-17 NOTE — Progress Notes (Signed)
VS-[id number]  Pt's states no medical or surgical changes since previsit or office visit.  

## 2019-10-17 NOTE — Op Note (Signed)
[facility] Patient Name: Jennifer Frazier Procedure Date: 10/17/2019 8:21 AM MRN: [id number] Endoscopist: Mallie Mussel L. Loletha Carrow , MD Age: 44 Referring MD:  Date of Birth: [birth year] Gender: Female Account #: [id number] Procedure:                Colonoscopy Indications:              Screening in patient at increased risk: Colorectal                            cancer in father before age 26, This is the                            patient's first colonoscopy Medicines:                Monitored Anesthesia Care Procedure:                Pre-Anesthesia Assessment:                           - Prior to the procedure, a History and Physical                            was performed, and patient medications and                            allergies were reviewed. The patient's tolerance of                            previous anesthesia was also reviewed. The risks                            and benefits of the procedure and the sedation                            options and risks were discussed with the patient.                            All questions were answered, and informed consent                            was obtained. Prior Anticoagulants: The patient has                            taken no previous anticoagulant or antiplatelet                            agents. ASA Grade Assessment: III - A patient with                            severe systemic disease. After reviewing the risks                            and benefits, the patient was deemed in  satisfactory condition to undergo the procedure.                           After obtaining informed consent, the colonoscope                            was passed under direct vision. Throughout the                            procedure, the patient's blood pressure, pulse, and                            oxygen saturations were monitored continuously. The                            Colonoscope was introduced through  the anus and                            advanced to the the cecum, identified by                            appendiceal orifice and ileocecal valve. The                            colonoscopy was performed without difficulty. The                            patient tolerated the procedure well. The quality                            of the bowel preparation was good. The ileocecal                            valve, appendiceal orifice, and rectum were                            photographed. The bowel preparation used was Plenvu. Scope In: 8:26:34 AM Scope Out: 8:44:01 AM Scope Withdrawal Time: 0 hours 12 minutes 56 seconds  Total Procedure Duration: 0 hours 17 minutes 27 seconds  Findings:                 The perianal and digital rectal examinations were                            normal.                           The colon (entire examined portion) was redundant.                           There is no endoscopic evidence of polyps in the                            entire colon.  The exam was otherwise without abnormality on                            direct and retroflexion views. Complications:            No immediate complications. Estimated Blood Loss:     Estimated blood loss: none. Impression:               - Redundant colon.                           - The examination was otherwise normal on direct                            and retroflexion views.                           - No specimens collected. Recommendation:           - Patient has a contact number available for                            emergencies. The signs and symptoms of potential                            delayed complications were discussed with the                            patient. Return to normal activities tomorrow.                            Written discharge instructions were provided to the                            patient.                           - Resume previous diet.                            - Continue present medications.                           - Repeat colonoscopy in 5 years for screening                            purposes. Ahana Najera L. Loletha Carrow, MD 10/17/2019 8:48:21 AM This report has been signed electronically.

## 2019-10-17 NOTE — Progress Notes (Signed)
Lidocaine 2% 85mL IV given as per Dr. Loletha Carrow.

## 2019-10-19 ENCOUNTER — Telehealth: Payer: Self-pay

## 2019-10-19 NOTE — Telephone Encounter (Signed)
  Follow up Call-  Call back number 10/17/2019  Post procedure Call Back phone  # [phone]  Permission to leave phone message Yes  Some recent data might be hidden     Patient questions:  Do you have a fever, pain , or abdominal swelling? No. Pain Score  0 *  Have you tolerated food without any problems? Yes.    Have you been able to return to your normal activities? Yes.    Do you have any questions about your discharge instructions: Diet   No. Medications  No. Follow up visit  No.  Do you have questions or concerns about your Care? No.  Actions: * If pain score is 4 or above: No action needed, pain <4.  Have you developed a fever since your procedure? No 2.   Have you had an respiratory symptoms (SOB or cough) since your procedure? No  3.   Have you tested positive for COVID 19 since your procedure No 4.   Have you had any family members/close contacts diagnosed with the COVID 19 since your procedure?  No  If yes to any of these questions please route to Joylene John, RN and Joella Prince, RN

## 2019-11-01 ENCOUNTER — Encounter (INDEPENDENT_AMBULATORY_CARE_PROVIDER_SITE_OTHER): Payer: Self-pay | Admitting: Family Medicine

## 2019-11-01 ENCOUNTER — Ambulatory Visit (INDEPENDENT_AMBULATORY_CARE_PROVIDER_SITE_OTHER): Payer: 59 | Admitting: Family Medicine

## 2019-11-01 ENCOUNTER — Other Ambulatory Visit: Payer: Self-pay

## 2019-11-01 VITALS — BP 107/67 | HR 69 | Temp 98.0°F | Ht 69.0 in | Wt 207.0 lb

## 2019-11-01 DIAGNOSIS — E669 Obesity, unspecified: Secondary | ICD-10-CM | POA: Diagnosis not present

## 2019-11-01 DIAGNOSIS — E8881 Metabolic syndrome: Secondary | ICD-10-CM

## 2019-11-01 DIAGNOSIS — I10 Essential (primary) hypertension: Secondary | ICD-10-CM | POA: Diagnosis not present

## 2019-11-01 DIAGNOSIS — Z6830 Body mass index (BMI) 30.0-30.9, adult: Secondary | ICD-10-CM

## 2019-11-01 DIAGNOSIS — E88819 Insulin resistance, unspecified: Secondary | ICD-10-CM

## 2019-11-01 MED ORDER — AMLODIPINE BESYLATE 10 MG PO TABS: 10.0000 mg | ORAL_TABLET | Freq: Every day | ORAL | 0 refills | Status: DC

## 2019-11-01 NOTE — Progress Notes (Signed)
Chief Complaint:   OBESITY Jennifer Frazier is here to discuss her progress with her obesity treatment plan along with follow-up of her obesity related diagnoses. [name] is on keeping a food journal and adhering to recommended goals of 1400-1500 calories and 90 grams of protein daily and states she is following her eating plan approximately 90% of the time. Jennifer Frazier states she is walking for 10 minutes 2-4 times per week.  Today's visit was #: 102 Starting weight: 294 lbs Starting date: 06/24/2017 Today's weight: 207 lbs Today's date: 11/01/2019 Total lbs lost to date: 87 Total lbs lost since last in-office visit: 0  Interim History: Jennifer Frazier feels she has been in a "funk". She has not been excited about exercise or the meal plan. She is journaling daily and meeting her calorie and protein goals. Her hunger is satisfied.  Subjective:   1. Essential hypertension [name]'s blood pressure is well controlled on Norvasc. Her blood pressure has trended down with weight loss.   BP Readings from Last 3 Encounters:  11/01/19 107/67  10/17/19 119/73  10/11/19 113/74   Lab Results  Component Value Date   CREATININE 0.97 04/18/2019   CREATININE 0.98 11/10/2018   CREATININE 0.94 11/04/2017   2. Insulin resistance Sondra has a diagnosis of insulin resistance based on her elevated fasting insulin level >5. She is not on metformin and she denies polyphagia.   Lab Results  Component Value Date   INSULIN 6.7 04/18/2019   INSULIN 11.0 12/08/2018   INSULIN 13.5 12/16/2017   INSULIN 6.2 06/24/2017   Lab Results  Component Value Date   HGBA1C 4.9 04/18/2019   Assessment/Plan:   1. Essential hypertension  We will monitor for signs of hypotension and may need to decease dose of amlodipine. We will refill Norvasc for 1 month.  - amLODipine (NORVASC) 10 MG tablet; Take 1 tablet (10 mg total) by mouth daily.  Dispense: 30 tablet; Refill: 0  2. Insulin resistance Jennifer Frazier will continue her meal plan, and will  continue to work on weight loss, exercise, and decreasing simple carbohydrates to help decrease the risk of diabetes.   3. Class 1 obesity with serious comorbidity and body mass index (BMI) of 30.0 to 30.9 in adult, unspecified obesity type Jennifer Frazier is currently in the action stage of change. As such, her goal is to continue with weight loss efforts. She has agreed to keeping a food journal and adhering to recommended goals of 1400-1500 calories and 90 grams of protein daily.   Exercise goals: Encouraged Jennifer Frazier to increase exercise to 30 minutes 3 times per week.  Behavioral modification strategies: planning for success.  Jennifer Frazier has agreed to follow-up with our clinic in 3 weeks.  Objective:   Blood pressure 107/67, pulse 69, temperature 98 F (36.7 C), temperature source Oral, height 5\' 9"  (1.753 m), weight 207 lb (93.9 kg), last menstrual period 09/03/2017, SpO2 100 %. Body mass index is 30.57 kg/m.  General: Cooperative, alert, well developed, in no acute distress. HEENT: Conjunctivae and lids unremarkable. Cardiovascular: Regular rhythm.  Lungs: Normal work of breathing. Neurologic: No focal deficits.   Lab Results  Component Value Date   CREATININE 0.97 04/18/2019   BUN 20 04/18/2019   NA 142 04/18/2019   K 3.9 04/18/2019   CL 104 04/18/2019   CO2 23 04/18/2019   Lab Results  Component Value Date   ALT 13 04/18/2019   AST 22 04/18/2019   ALKPHOS 95 04/18/2019   BILITOT 0.3 04/18/2019   Lab  Results  Component Value Date   HGBA1C 4.9 04/18/2019   HGBA1C 5.0 12/08/2018   HGBA1C 5.1 12/16/2017   HGBA1C 5.3 06/24/2017   Lab Results  Component Value Date   INSULIN 6.7 04/18/2019   INSULIN 11.0 12/08/2018   INSULIN 13.5 12/16/2017   INSULIN 6.2 06/24/2017   Lab Results  Component Value Date   TSH 1.600 04/18/2019   Lab Results  Component Value Date   CHOL 142 04/18/2019   HDL 82 04/18/2019   LDLCALC 47 04/18/2019   TRIG 62 04/18/2019   Lab Results  Component  Value Date   WBC 5.9 11/10/2018   HGB 13.7 11/10/2018   HCT 41.1 11/10/2018   MCV 84.9 11/10/2018   PLT 196 11/10/2018   No results found for: IRON, TIBC, FERRITIN  Attestation Statements:   Reviewed by clinician on day of visit: allergies, medications, problem list, medical history, surgical history, family history, social history, and previous encounter notes.   Wilhemena Durie, am acting as Location manager for [employer], FNP-C.  I have reviewed the above documentation for accuracy and completeness, and I agree with the above. - Georgianne Fick, FNP

## 2019-11-03 ENCOUNTER — Telehealth: Payer: Self-pay | Admitting: Podiatry

## 2019-11-03 NOTE — Telephone Encounter (Signed)
Pt called wanting to know if she could wear nail polish. She had an ingrown removed on 8/24. Please advise.

## 2019-11-06 NOTE — Telephone Encounter (Signed)
Yes that is fine  Thanks   

## 2019-11-12 NOTE — Progress Notes (Signed)
[place]  [phone]) [phone] Fax:(336) [phone]     ID: TAHIRI SHAREEF DOB: [birth year]  MR#: [id number]  ZYY#:[id number]  Patient Care Team: Sharilyn Sites, MD as PCP - General (Family Medicine) Maleny Candy, Virgie Dad, MD as Consulting Physician (Oncology) Thea Silversmith, MD as Consulting Physician (Radiation Oncology) [name], MD as Consulting Physician (General Surgery) Hinda Lenis (Dermatology) OTHER MD: Legrand Como Altheimer MD  CHIEF COMPLAINT: Estrogen receptor positive breast cancer  CURRENT TREATMENT: Completing 5 years of tamoxifen   INTERVAL HISTORY: Chandi returns today for follow-up of her estrogen receptor positive breast cancer.   She continues on tamoxifen.  Tolerates this with no significant side effects that she is aware of.  Specifically hot flashes and vaginal dryness have not been a big issue for her.  In fact she has been feeling cold lately she says.  Since her last visit, she underwent bilateral screening mammography with tomography at [facility] on 10/10/2019 showing: breast density category B; no evidence of malignancy in either breast.    REVIEW OF SYSTEMS: Emer is working for [employer] again and enjoying it.  She is participating in the [facility] health weight loss program and has lost quite a bit of weight.  She has a very good understanding of her diet and she is walking at least 30 minutes every day.  She tells me her mom who of course had a remote stroke is doing well.  A detailed review of systems is otherwise stable   BREAST CANCER HISTORY: From the original intake note:  The patient had screening mammography at [place] showing calcifications in the right breast, and was referred to the breast Center 07/06/2013 for additional views. Right diagnostic mammography confirmed a group of amorphous calcifications in the upper outer right breast measuring 1.2 cm.  Biopsy of this area 07/17/2013 showed  ([id number]) ductal carcinoma in situ, high-grade, estrogen receptor 100% positive, and progesterone receptor 96% positive, both with strong staining intensity.  The patient was then referred to surgery and on 07/25/2013 underwent bilateral breast MRI. This showed a breast density category CEA. In the right breast there was an area of clumped nodular enhancement measuring 3 cm. There was also a 1.5 cm circumscribed oval mass in the medial portion of the right breast consistent with a fibroadenoma. The left breast was unremarkable, and there were no abnormal appearing lymph nodes.  On 08/08/2013 the patient underwent right lumpectomy and sentinel lymph node sampling. The pathology ([id number] was (showed, in addition to ductal carcinoma in situ, invasive ductal carcinoma, measuring 0.7 cm, grade 3, estrogen receptor 86% positive, progesterone receptor 89% positive, both with strong staining intensity, with an MIB-1 of 62%, and no HER-2 amplification. Both sentinel lymph nodes were clear. Margins were ample.   The patient's subsequent history is as detailed below   PAST MEDICAL HISTORY: Past Medical History:  Diagnosis Date  . Anemia   . Anxiety   . Cancer ([place]) 2015   R breast   . Contact lens/glasses fitting    wears contacts or glasses  . Depression   . Fibroids    uterine fibroids  . GERD (gastroesophageal reflux disease)    on meds  . Hypertension    on meds  . Hypothyroidism   . Obesity   . Personal history of chemotherapy   . Personal history of radiation therapy   . Radiation 04/04/14-05/21/14   Right upper-outer breast  . Sleep apnea    has/uses  CPAP  . Thyroid disease    not currently on meds at this time    PAST SURGICAL HISTORY: Past Surgical History:  Procedure Laterality Date  . BREAST LUMPECTOMY Right 08/03/2013  . EYE SURGERY  2013   retinal tear-lazer-both  . PORT A CATH REVISION N/A 12/19/2013   Procedure: REPOSITION PORT;  Surgeon: [name], MD;   Location: [place];  Service: General;  Laterality: N/A;  . PORT-A-CATH REMOVAL Right 08/08/2014   Procedure: REMOVAL PORT-A-CATH;  Surgeon: [name], MD;  Location: [place];  Service: General;  Laterality: Right;  . PORTACATH PLACEMENT Right 09/29/2013   Procedure: INSERTION PORT-A-CATH WITH ULTRA SOUND;  Surgeon: [name], MD;  Location: [place];  Service: General;  Laterality: Right;  . UTERINE FIBROID SURGERY  2012   uterine fibroids  . WISDOM TOOTH EXTRACTION  2003    FAMILY HISTORY Family History  Problem Relation Age of Onset  . Colon polyps Father 43  . Colon cancer Father 24  . High blood pressure Mother   . Stroke Mother   . Anxiety disorder Mother   . Sleep apnea Mother   . Obesity Mother   . Cancer Maternal Aunt 50       breast cancer  . Breast cancer Maternal Aunt   . Cancer Paternal Aunt 20       breast cancer  . Breast cancer Paternal Aunt   . Cancer Maternal Grandmother 52       cervical or uterine CA  . Cancer Paternal Grandmother        uterine or cervical CA  . Esophageal cancer Neg Hx   . Rectal cancer Neg Hx   . Stomach cancer Neg Hx    the patient's father died at the age of 74, been diagnosed with colon cancer at the age of 52. The patient's mother is living, currently 6 years old. The patient has one brother, no sisters. One of the patient's mother's sisters was diagnosed with breast cancer at the age of 56 in one of the patient's father's mother was diagnosed with breast cancer at the age of 42. There is no history of ovarian cancer in the family.   GYNECOLOGIC HISTORY:  Patient's last menstrual period was 09/03/2017. Menarche age 48; patient is GX P0. She was still having regular periods at the start of chemotherapy. Her periods stopped with chemotherapy and have not resumed. She was evaluated at the fertility center in [facility] and they did not feel they would be able to stimulate egg  production.She used oral contraceptives for approximately one year with no complications.     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 She finds the work challenging and interesting but also stressful. She is single and her mother lives with her.(The patient's mother is wheelchair-bound secondary to a stroke).    ADVANCED DIRECTIVES: Not in place; at the 08/24/2013 visit the patient was given the appropriate documents to come feel notarize associate may declare healthcare power of attorney.   HEALTH MAINTENANCE: Social History   Tobacco Use  . Smoking status: Never Smoker  . Smokeless tobacco: Never Used  Vaping Use  . Vaping Use: Never used  Substance Use Topics  . Alcohol use: No  . Drug use: No     Allergies  Allergen Reactions  . Gadolinium      Desc: NAUSEA WITH MRI CONTRAST-NO VOMITING   . Tramadol Hives and Nausea Only    Current Outpatient Medications  Medication Sig Dispense Refill  . amLODipine (NORVASC) 10 MG tablet Take 1 tablet (10 mg total) by mouth daily. 30 tablet 0  . calcium carbonate (TUMS - DOSED IN MG ELEMENTAL CALCIUM) 500 MG chewable tablet Chew 1 tablet by mouth daily as needed for indigestion or heartburn.    . diphenhydrAMINE (BENADRYL) 25 MG tablet Take 25 mg by mouth every 6 (six) hours as needed.    . neomycin-colistin-hydrocortisone-thonzonium (CORTISPORIN-TC) 3.03-28-08-0.5 MG/ML OTIC suspension Apply 1-2 drops daily with soaks to toenail 10 mL 0  . tamoxifen (NOLVADEX) 20 MG tablet Take 1 tablet (20 mg total) by mouth daily. 90 tablet 4   No current facility-administered medications for this visit.    OBJECTIVE: African-American woman who appears stated age  12:   11/13/19 1339  BP: 122/76  Pulse: 65  Resp: 18  Temp: 97.8 F (36.6 C)  SpO2: 100%    Wt Readings from Last 3 Encounters:  11/13/19 212 lb 4.8 oz (96.3 kg)  11/01/19 207 lb (93.9 kg)  10/17/19 213 lb 9.6 oz (96.9 kg)   Body mass index is 31.35 kg/m.    ECOG FS:1 - Symptomatic but completely ambulatory  Sclerae unicteric, EOMs intact Wearing a mask No cervical or supraclavicular adenopathy Lungs no rales or rhonchi Heart regular rate and rhythm Abd soft, nontender, positive bowel sounds MSK no focal spinal tenderness, no upper extremity lymphedema Neuro: nonfocal, well oriented, appropriate affect Breasts: The right breast is status post lumpectomy and radiation.  There is no evidence of disease recurrence.  The left breast is benign.  Both axillae are benign.   LAB RESULTS:  CMP     Component Value Date/Time   NA 140 11/13/2019 1321   NA 142 04/18/2019 1228   NA 140 11/04/2016 1500   K 3.7 11/13/2019 1321   K 3.8 11/04/2016 1500   CL 105 11/13/2019 1321   CO2 29 11/13/2019 1321   CO2 27 11/04/2016 1500   GLUCOSE 81 11/13/2019 1321   GLUCOSE 88 11/04/2016 1500   BUN 23 (H) 11/13/2019 1321   BUN 20 04/18/2019 1228   BUN 17.6 11/04/2016 1500   CREATININE 0.96 11/13/2019 1321   CREATININE 1.1 11/04/2016 1500   CALCIUM 10.2 11/13/2019 1321   CALCIUM 9.6 11/04/2016 1500   PROT 7.7 11/13/2019 1321   PROT 7.5 04/18/2019 1228   PROT 7.5 11/04/2016 1500   ALBUMIN 3.8 11/13/2019 1321   ALBUMIN 4.3 04/18/2019 1228   ALBUMIN 3.8 11/04/2016 1500   AST 18 11/13/2019 1321   AST 20 11/04/2016 1500   ALT 12 11/13/2019 1321   ALT 15 11/04/2016 1500   ALKPHOS 78 11/13/2019 1321   ALKPHOS 87 11/04/2016 1500   BILITOT 0.4 11/13/2019 1321   BILITOT 0.3 04/18/2019 1228   BILITOT 0.31 11/04/2016 1500   GFRNONAA >60 11/13/2019 1321   GFRAA 82 04/18/2019 1228    I No results found for: SPEP  Lab Results  Component Value Date   WBC 6.0 11/13/2019   NEUTROABS 3.3 11/13/2019   HGB 13.2 11/13/2019   HCT 39.4 11/13/2019   MCV 85.1 11/13/2019   PLT 179  11/13/2019      Chemistry      Component Value Date/Time   NA 140 11/13/2019 1321   NA 142 04/18/2019 1228   NA  140 11/04/2016 1500   K 3.7 11/13/2019 1321   K 3.8 11/04/2016 1500   CL 105 11/13/2019 1321   CO2 29 11/13/2019 1321   CO2 27 11/04/2016 1500   BUN 23 (H) 11/13/2019 1321   BUN 20 04/18/2019 1228   BUN 17.6 11/04/2016 1500   CREATININE 0.96 11/13/2019 1321   CREATININE 1.1 11/04/2016 1500      Component Value Date/Time   CALCIUM 10.2 11/13/2019 1321   CALCIUM 9.6 11/04/2016 1500   ALKPHOS 78 11/13/2019 1321   ALKPHOS 87 11/04/2016 1500   AST 18 11/13/2019 1321   AST 20 11/04/2016 1500   ALT 12 11/13/2019 1321   ALT 15 11/04/2016 1500   BILITOT 0.4 11/13/2019 1321   BILITOT 0.3 04/18/2019 1228   BILITOT 0.31 11/04/2016 1500       No results found for: LABCA2  No components found for: LABCA125  No results for input(s): INR in the last 168 hours.  Urinalysis    Component Value Date/Time   COLORURINE YELLOW 02/02/2014 0845   APPEARANCEUR CLEAR 02/02/2014 0845   LABSPEC 1.009 02/02/2014 0845   PHURINE 6.0 02/02/2014 0845   GLUCOSEU NEGATIVE 02/02/2014 0845   HGBUR NEGATIVE 02/02/2014 0845   BILIRUBINUR NEGATIVE 02/02/2014 0845   KETONESUR NEGATIVE 02/02/2014 0845   PROTEINUR NEGATIVE 02/02/2014 0845   UROBILINOGEN 0.2 02/02/2014 0845   NITRITE NEGATIVE 02/02/2014 0845   LEUKOCYTESUR TRACE (A) 02/02/2014 0845    STUDIES: No results found.    ASSESSMENT: 44 y.o. BRCA negative [facility] woman status post right breast Upper outer quadrant biopsy 07/17/2013 for high-grade ductal carcinoma in situ, estrogen and progesterone receptor positive  (1) status post right breast upper outer quadrant lumpectomy and sentinel lymph node sampling 08/08/2013 for a pT1b pN0, stage IA invasive ductal carcinoma, grade 3, estrogen and progesterone receptor positive, with an MIB-1 of 62% and no HER-2 amplification  (2) genetics sent 08/02/2013 was normal but did  identify a variant of uncertain significance called BRCA2, [id number].  (3) Oncotype score of 23 predicts a risk of outside the breast recurrence within 10 years of 15% if the patient's only systemic treatment is tamoxifen for 5 years. Adjuvant chemotherapy was recommended  (4) doxorubicin and cyclophosphamide in dose dense fashion x4 completed 11/16/2013, followed by paclitaxel weekly x12 completed 03/08/2014  (5) adjuvant radiation 3/9/206-05/21/2014:  Right breast / 45 Gray @ 1.8 Pearline Cables per fraction x 25 fractions Right breast boost / 16 Gray at [employer] per fraction x 8 fractions  (6) tamoxifen started 06/20/2014, completing 5 years October 2021   PLAN: Jaquel is now a little over 6 years out from definitive surgery for her breast cancer with no evidence of disease recurrence.  This is favorable.  She is completing 5 years of tamoxifen.  In her situation I do not think she would gain much from continuing on additional 5 years so we are stopping tamoxifen this month.  At this point I feel comfortable releasing her to her primary care physician.  All she will need in terms of breast cancer follow-up is a yearly mammogram and a yearly physician breast exam.  I will be glad to see Aemilia again at any point in the future if and when the need arises but as of now are making no routine appointments for her here.  Encounter time 25 minutes.*   Richie Vadala, Virgie Dad, MD  11/13/19 5:15 PM Medical Oncology and Hematology [facility] [address] [place], [place] [address]  Tel. [phone]    Fax. [phone]   I, Wilburn Mylar, am acting as scribe for Dr. Virgie Dad. Kathia Covington.  I, Lurline Del MD, have reviewed the above documentation for accuracy and completeness, and I agree with the above.    *Total Encounter Time as defined by the Centers for Medicare and Medicaid Services includes, in addition to the face-to-face time of a patient visit (documented in the note above)  non-face-to-face time: obtaining and reviewing outside history, ordering and reviewing medications, tests or procedures, care coordination (communications with other health care professionals or caregivers) and documentation in the medical record.

## 2019-11-13 ENCOUNTER — Inpatient Hospital Stay: Payer: 59 | Attending: Oncology | Admitting: Oncology

## 2019-11-13 ENCOUNTER — Other Ambulatory Visit: Payer: Self-pay

## 2019-11-13 ENCOUNTER — Inpatient Hospital Stay: Payer: 59

## 2019-11-13 VITALS — BP 122/76 | HR 65 | Temp 97.8°F | Resp 18 | Ht 69.0 in | Wt 212.3 lb

## 2019-11-13 DIAGNOSIS — Z853 Personal history of malignant neoplasm of breast: Secondary | ICD-10-CM | POA: Insufficient documentation

## 2019-11-13 DIAGNOSIS — Z800 Family history of malignant neoplasm of digestive organs: Secondary | ICD-10-CM | POA: Insufficient documentation

## 2019-11-13 DIAGNOSIS — Z9221 Personal history of antineoplastic chemotherapy: Secondary | ICD-10-CM | POA: Insufficient documentation

## 2019-11-13 DIAGNOSIS — D0510 Intraductal carcinoma in situ of unspecified breast: Secondary | ICD-10-CM

## 2019-11-13 DIAGNOSIS — C50411 Malignant neoplasm of upper-outer quadrant of right female breast: Secondary | ICD-10-CM

## 2019-11-13 DIAGNOSIS — D0511 Intraductal carcinoma in situ of right breast: Secondary | ICD-10-CM

## 2019-11-13 DIAGNOSIS — Z923 Personal history of irradiation: Secondary | ICD-10-CM | POA: Diagnosis not present

## 2019-11-13 DIAGNOSIS — Z803 Family history of malignant neoplasm of breast: Secondary | ICD-10-CM | POA: Diagnosis not present

## 2019-11-13 DIAGNOSIS — Z170 Estrogen receptor positive status [ER+]: Secondary | ICD-10-CM

## 2019-11-13 LAB — COMPREHENSIVE METABOLIC PANEL
ALT: 12 U/L (ref 0–44)
AST: 18 U/L (ref 15–41)
Albumin: 3.8 g/dL (ref 3.5–5.0)
Alkaline Phosphatase: 78 U/L (ref 38–126)
Anion gap: 6 (ref 5–15)
BUN: 23 mg/dL — ABNORMAL HIGH (ref 6–20)
CO2: 29 mmol/L (ref 22–32)
Calcium: 10.2 mg/dL (ref 8.9–10.3)
Chloride: 105 mmol/L (ref 98–111)
Creatinine, Ser: 0.96 mg/dL (ref 0.44–1.00)
GFR, Estimated: >60 mL/min (ref >60)
Glucose, Bld: 81 mg/dL (ref 70–99)
Potassium: 3.7 mmol/L (ref 3.5–5.1)
Sodium: 140 mmol/L (ref 135–145)
Total Bilirubin: 0.4 mg/dL (ref 0.3–1.2)
Total Protein: 7.7 g/dL (ref 6.5–8.1)

## 2019-11-13 LAB — CBC WITH DIFFERENTIAL/PLATELET
Abs Immature Granulocytes: 0 10*3/uL (ref 0.00–0.07)
Basophils Absolute: 0 10*3/uL (ref 0.0–0.1)
Basophils Relative: 0 %
Eosinophils Absolute: 0.1 10*3/uL (ref 0.0–0.5)
Eosinophils Relative: 2 %
HCT: 39.4 % (ref 36.0–46.0)
Hemoglobin: 13.2 g/dL (ref 12.0–15.0)
Immature Granulocytes: 0 %
Lymphocytes Relative: 36 %
Lymphs Abs: 2.2 10*3/uL (ref 0.7–4.0)
MCH: 28.5 pg (ref 26.0–34.0)
MCHC: 33.5 g/dL (ref 30.0–36.0)
MCV: 85.1 fL (ref 80.0–100.0)
Monocytes Absolute: 0.3 10*3/uL (ref 0.1–1.0)
Monocytes Relative: 5 %
Neutro Abs: 3.3 10*3/uL (ref 1.7–7.7)
Neutrophils Relative %: 57 %
Platelets: 179 10*3/uL (ref 150–400)
RBC: 4.63 MIL/uL (ref 3.87–5.11)
RDW: 13 % (ref 11.5–15.5)
WBC: 6 10*3/uL (ref 4.0–10.5)
nRBC: 0 % (ref 0.0–0.2)

## 2019-11-15 ENCOUNTER — Telehealth: Payer: Self-pay | Admitting: Oncology

## 2019-11-15 NOTE — Telephone Encounter (Signed)
No 10/18 los, no changes made to pt schedule

## 2019-11-23 ENCOUNTER — Encounter (INDEPENDENT_AMBULATORY_CARE_PROVIDER_SITE_OTHER): Payer: Self-pay | Admitting: Family Medicine

## 2019-11-23 ENCOUNTER — Other Ambulatory Visit: Payer: Self-pay

## 2019-11-23 ENCOUNTER — Ambulatory Visit (INDEPENDENT_AMBULATORY_CARE_PROVIDER_SITE_OTHER): Payer: 59 | Admitting: Family Medicine

## 2019-11-23 VITALS — BP 122/70 | HR 72 | Temp 98.4°F | Ht 69.0 in | Wt 207.0 lb

## 2019-11-23 DIAGNOSIS — I10 Essential (primary) hypertension: Secondary | ICD-10-CM | POA: Diagnosis not present

## 2019-11-23 DIAGNOSIS — E8881 Metabolic syndrome: Secondary | ICD-10-CM

## 2019-11-23 DIAGNOSIS — Z6830 Body mass index (BMI) 30.0-30.9, adult: Secondary | ICD-10-CM | POA: Diagnosis not present

## 2019-11-23 DIAGNOSIS — Z9189 Other specified personal risk factors, not elsewhere classified: Secondary | ICD-10-CM

## 2019-11-23 DIAGNOSIS — E669 Obesity, unspecified: Secondary | ICD-10-CM | POA: Diagnosis not present

## 2019-11-23 MED ORDER — AMLODIPINE BESYLATE 10 MG PO TABS: 10.0000 mg | ORAL_TABLET | Freq: Every day | ORAL | 0 refills | Status: DC

## 2019-11-23 NOTE — Progress Notes (Signed)
Chief Complaint:   OBESITY Jennifer Frazier is here to discuss her progress with her obesity treatment plan along with follow-up of her obesity related diagnoses. Jennifer Frazier is keeping a food journal and adhering to recommended goals of 1400-1500 calories and 90 grams of protein and states she is following her eating plan approximately 90% of the time. Jennifer Frazier states she is walking and doing You Tube exercises 30 minutes 5 times per week.  Today's visit was #: 60 Starting weight: 294 lbs Starting date: 06/24/2017 Today's weight: 207 lbs Today's date: 11/23/2019 Total lbs lost to date: 87 Total lbs lost since last in-office visit: 0  Interim History: Jennifer Frazier has maintained her weight today and is a bit disappointed because she has increased her exercise/walking and adhered to the plan very well. She is meeting her calorie and protein goals consistently.  She notes she is feeling more rested than she had been.  Of note, she is up some body water weight today.  Subjective:   Essential hypertension. Blood pressure is well controlled on Norvasc.  BP Readings from Last 3 Encounters:  11/23/19 122/70  11/13/19 122/76  11/01/19 107/67   Lab Results  Component Value Date   CREATININE 0.96 11/13/2019   CREATININE 0.97 04/18/2019   CREATININE 0.98 11/10/2018   Insulin resistance. Jennifer Frazier has a diagnosis of insulin resistance based on her elevated fasting insulin level >5. Jennifer Frazier denies excessive hunger.  Lab Results  Component Value Date   INSULIN 6.7 04/18/2019   INSULIN 11.0 12/08/2018   INSULIN 13.5 12/16/2017   INSULIN 6.2 06/24/2017   Lab Results  Component Value Date   HGBA1C 4.9 04/18/2019   At increased risk of exposure to COVID-19 virus. The patient is at higher risk of COVID-19 infection due to higher infection rates locally. She needs a booster - is high risk for poor outcome from COVID due to obesity and hypertension. She plans on getting a booster soon.  Assessment/Plan:     Essential hypertension.  Refill was given for amLODipine (NORVASC) 10 MG tablet daily #30 with 0 refills.  Insulin resistance. Jennifer Frazier will continue to work on weight loss, exercise, and decreasing simple carbohydrates to help decrease the risk of diabetes. Jennifer Frazier agreed to follow-up with [name] as directed to closely monitor her progress. She will continue her meal plan as directed.   At increased risk of exposure to COVID-19 virus. Jennifer Frazier was given approximately 15 minutes of COVID prevention counseling today Counseling  COVID-19 is a respiratory infection that is caused by a virus. It can cause serious infections, such as pneumonia, acute respiratory distress syndrome, acute respiratory failure, or sepsis.  You are more likely to develop a serious illness if you are 52 years of age or older, have a weak immune system, live in a nursing home, have chronic disease, or have obesity.  Get vaccinated as soon as they are available to you.  For our most current information, please visit [email or web address].  Wash your hands often with soap and water for 20 seconds. If soap and water are not available, use alcohol-based hand sanitizer.  Wear a face mask. Make sure your mask covers your nose and mouth.  Maintain at least 6 feet distance from others when in public.  Get help right away if  You have trouble breathing, chest pain, confusion, or other concerning symptoms.  Repetitive spaced learning was employed today to elicit superior memory formation and behavioral change.  Class 1 obesity with serious comorbidity and  body mass index (BMI) of 30.0 to 30.9 in adult, unspecified obesity type.  Jennifer Frazier is currently in the action stage of change. As such, her goal is to continue with weight loss efforts. She has agreed to keeping a food journal and adhering to recommended goals of 1400-1500 calories and 90 grams of protein daily.   Exercise goals: Jennifer Frazier will continue her current exercise  regimen.   Behavioral modification strategies: increasing water intake and planning for success.  Jennifer Frazier has agreed to follow-up with our clinic in 3 weeks.   Objective:   Blood pressure 122/70, pulse 72, temperature 98.4 F (36.9 C), height 5\' 9"  (1.753 m), weight 207 lb (93.9 kg), last menstrual period 09/03/2017, SpO2 99 %. Body mass index is 30.57 kg/m.  General: Cooperative, alert, well developed, in no acute distress. HEENT: Conjunctivae and lids unremarkable. Cardiovascular: Regular rhythm.  Lungs: Normal work of breathing. Neurologic: No focal deficits.   Lab Results  Component Value Date   CREATININE 0.96 11/13/2019   BUN 23 (H) 11/13/2019   NA 140 11/13/2019   K 3.7 11/13/2019   CL 105 11/13/2019   CO2 29 11/13/2019   Lab Results  Component Value Date   ALT 12 11/13/2019   AST 18 11/13/2019   ALKPHOS 78 11/13/2019   BILITOT 0.4 11/13/2019   Lab Results  Component Value Date   HGBA1C 4.9 04/18/2019   HGBA1C 5.0 12/08/2018   HGBA1C 5.1 12/16/2017   HGBA1C 5.3 06/24/2017   Lab Results  Component Value Date   INSULIN 6.7 04/18/2019   INSULIN 11.0 12/08/2018   INSULIN 13.5 12/16/2017   INSULIN 6.2 06/24/2017   Lab Results  Component Value Date   TSH 1.600 04/18/2019   Lab Results  Component Value Date   CHOL 142 04/18/2019   HDL 82 04/18/2019   LDLCALC 47 04/18/2019   TRIG 62 04/18/2019   Lab Results  Component Value Date   WBC 6.0 11/13/2019   HGB 13.2 11/13/2019   HCT 39.4 11/13/2019   MCV 85.1 11/13/2019   PLT 179 11/13/2019   No results found for: IRON, TIBC, FERRITIN  Attestation Statements:   Reviewed by clinician on day of visit: allergies, medications, problem list, medical history, surgical history, family history, social history, and previous encounter notes.  Jennifer Frazier, am acting as Location manager for [employer], FNP-C   I have reviewed the above documentation for accuracy and completeness, and I agree with the  above. -  Georgianne Fick, FNP

## 2019-12-14 ENCOUNTER — Other Ambulatory Visit: Payer: Self-pay

## 2019-12-14 ENCOUNTER — Ambulatory Visit (INDEPENDENT_AMBULATORY_CARE_PROVIDER_SITE_OTHER): Payer: 59 | Admitting: Family Medicine

## 2019-12-14 ENCOUNTER — Encounter (INDEPENDENT_AMBULATORY_CARE_PROVIDER_SITE_OTHER): Payer: Self-pay | Admitting: Family Medicine

## 2019-12-14 VITALS — BP 132/84 | HR 64 | Temp 98.3°F | Ht 69.0 in | Wt 209.0 lb

## 2019-12-14 DIAGNOSIS — I10 Essential (primary) hypertension: Secondary | ICD-10-CM | POA: Diagnosis not present

## 2019-12-14 DIAGNOSIS — E8881 Metabolic syndrome: Secondary | ICD-10-CM | POA: Diagnosis not present

## 2019-12-14 DIAGNOSIS — Z6831 Body mass index (BMI) 31.0-31.9, adult: Secondary | ICD-10-CM

## 2019-12-14 DIAGNOSIS — E669 Obesity, unspecified: Secondary | ICD-10-CM

## 2019-12-14 MED ORDER — AMLODIPINE BESYLATE 10 MG PO TABS: 10.0000 mg | ORAL_TABLET | Freq: Every day | ORAL | 0 refills | Status: DC

## 2019-12-18 ENCOUNTER — Encounter (INDEPENDENT_AMBULATORY_CARE_PROVIDER_SITE_OTHER): Payer: Self-pay | Admitting: Family Medicine

## 2019-12-18 NOTE — Progress Notes (Signed)
Chief Complaint:   OBESITY Jennifer Frazier is here to discuss her progress with her obesity treatment plan along with follow-up of her obesity related diagnoses. Jennifer Frazier is on keeping a food journal and adhering to recommended goals of 1400-1500 calories and 90 grams of protein daily and states she is following her eating plan approximately 90-95% of the time. Jennifer Frazier states she walks for 30 minutes 5 times per week.  Today's visit was #: 106 Starting weight: 294 lbs Starting date: 06/24/2017 Today's weight: 209 lbs Today's date: 12/14/2019 Total lbs lost to date: 55 Total lbs lost since last in-office visit: 0 (+2)  Interim History: Jennifer Frazier is up in water weight today per bioimpedance, and she has gained 2 lbs. She is unsure why she gained. She has increased exercise and has adhered to the plan  well.  She is sometimes low on protein, getting between 70 and 80 grams daily. She skips lunch at times and then eats too many calories late in day.  Subjective:   1. Essential hypertension Jennifer Frazier's blood pressure is well controlled on Norvasc.   BP Readings from Last 3 Encounters:  12/14/19 132/84  11/23/19 122/70  11/13/19 122/76   Lab Results  Component Value Date   CREATININE 0.96 11/13/2019   CREATININE 0.97 04/18/2019   CREATININE 0.98 11/10/2018   2. Insulin resistance Jennifer Frazier has a diagnosis of insulin resistance based on her elevated fasting insulin level >5. She denies polyphagia, and she is not on metformin.  Lab Results  Component Value Date   INSULIN 6.7 04/18/2019   INSULIN 11.0 12/08/2018   INSULIN 13.5 12/16/2017   INSULIN 6.2 06/24/2017   Lab Results  Component Value Date   HGBA1C 4.9 04/18/2019   Assessment/Plan:   1. Essential hypertension We will refill Norvasc for 1 month.  - amLODipine (NORVASC) 10 MG tablet; Take 1 tablet (10 mg total) by mouth daily.  Dispense: 30 tablet; Refill: 0  2. Insulin resistance Jennifer Frazier will continue her meal plan.  3. Class 1 obesity  with serious comorbidity and body mass index (BMI) of 31.0 to 31.9 in adult, unspecified obesity type Jennifer Frazier is currently in the action stage of change. As such, her goal is to continue with weight loss efforts. She has agreed to keeping a food journal and adhering to recommended goals of 1400-1500 calories and 90 grams of protein daily.   Handout was given today: Thanksgiving Tips.    Exercise goals: As is.  Behavioral modification strategies: increasing lean protein intake, decreasing simple carbohydrates, no skipping meals and better snacking choices.  Jennifer Frazier has agreed to follow-up with our clinic in 3 weeks.   Objective:   Blood pressure 132/84, pulse 64, temperature 98.3 F (36.8 C), height 5\' 9"  (1.753 m), weight 209 lb (94.8 kg), last menstrual period 09/03/2017, SpO2 100 %. Body mass index is 30.86 kg/m.  General: Cooperative, alert, well developed, in no acute distress. HEENT: Conjunctivae and lids unremarkable. Cardiovascular: Regular rhythm.  Lungs: Normal work of breathing. Neurologic: No focal deficits.   Lab Results  Component Value Date   CREATININE 0.96 11/13/2019   BUN 23 (H) 11/13/2019   NA 140 11/13/2019   K 3.7 11/13/2019   CL 105 11/13/2019   CO2 29 11/13/2019   Lab Results  Component Value Date   ALT 12 11/13/2019   AST 18 11/13/2019   ALKPHOS 78 11/13/2019   BILITOT 0.4 11/13/2019   Lab Results  Component Value Date   HGBA1C 4.9 04/18/2019  HGBA1C 5.0 12/08/2018   HGBA1C 5.1 12/16/2017   HGBA1C 5.3 06/24/2017   Lab Results  Component Value Date   INSULIN 6.7 04/18/2019   INSULIN 11.0 12/08/2018   INSULIN 13.5 12/16/2017   INSULIN 6.2 06/24/2017   Lab Results  Component Value Date   TSH 1.600 04/18/2019   Lab Results  Component Value Date   CHOL 142 04/18/2019   HDL 82 04/18/2019   LDLCALC 47 04/18/2019   TRIG 62 04/18/2019   Lab Results  Component Value Date   WBC 6.0 11/13/2019   HGB 13.2 11/13/2019   HCT 39.4 11/13/2019    MCV 85.1 11/13/2019   PLT 179 11/13/2019   No results found for: IRON, TIBC, FERRITIN  Attestation Statements:   Reviewed by clinician on day of visit: allergies, medications, problem list, medical history, surgical history, family history, social history, and previous encounter notes.   Wilhemena Durie, am acting as Location manager for [employer], FNP-C.  I have reviewed the above documentation for accuracy and completeness, and I agree with the above. -  Georgianne Fick, FNP

## 2020-01-02 ENCOUNTER — Other Ambulatory Visit: Payer: Self-pay

## 2020-01-02 ENCOUNTER — Ambulatory Visit (INDEPENDENT_AMBULATORY_CARE_PROVIDER_SITE_OTHER): Payer: 59 | Admitting: Family Medicine

## 2020-01-02 ENCOUNTER — Encounter (INDEPENDENT_AMBULATORY_CARE_PROVIDER_SITE_OTHER): Payer: Self-pay | Admitting: Family Medicine

## 2020-01-02 VITALS — BP 115/78 | HR 66 | Temp 98.3°F | Ht 69.0 in | Wt 208.0 lb

## 2020-01-02 DIAGNOSIS — Z9189 Other specified personal risk factors, not elsewhere classified: Secondary | ICD-10-CM | POA: Diagnosis not present

## 2020-01-02 DIAGNOSIS — I10 Essential (primary) hypertension: Secondary | ICD-10-CM

## 2020-01-02 DIAGNOSIS — Z853 Personal history of malignant neoplasm of breast: Secondary | ICD-10-CM

## 2020-01-02 DIAGNOSIS — M545 Low back pain, unspecified: Secondary | ICD-10-CM | POA: Diagnosis not present

## 2020-01-02 DIAGNOSIS — E038 Other specified hypothyroidism: Secondary | ICD-10-CM | POA: Diagnosis not present

## 2020-01-02 DIAGNOSIS — E669 Obesity, unspecified: Secondary | ICD-10-CM

## 2020-01-02 DIAGNOSIS — Z6830 Body mass index (BMI) 30.0-30.9, adult: Secondary | ICD-10-CM

## 2020-01-03 NOTE — Progress Notes (Signed)
Chief Complaint:   OBESITY Jennifer Frazier is here to discuss her progress with her obesity treatment plan along with follow-up of her obesity related diagnoses.   Today's visit was #: 31 Starting weight: 294 lbs Starting date: 06/24/2017 Today's weight: 208 lbs Today's date: 01/02/2020 Total lbs lost to date: 86 lbs Body mass index is 30.72 kg/m.  Total weight loss percentage to date: -29.25%  Interim History: Jennifer Frazier is down 86 pounds total.  She says she wants to increase her strength training.  We discussed one-on-one personal training and PT Pilates. Nutrition Plan: keeping a food journal and adhering to recommended goals of 1400-1500 calories and 90 grams of protein for 90-95% of the time.  Activity: Walking for 20-30 minutes 5 times per week.  Assessment/Plan:   1. Essential hypertension At goal. Medications: Norvasc 10 mg daily.   Plan:  She has trace LE edema.  Consider changing to ARB.  Avoid buying foods that are: processed, frozen, or prepackaged to avoid excess salt. We will continue to monitor symptoms as they relate to her weight loss journey.  BP Readings from Last 3 Encounters:  01/02/20 115/78  12/14/19 132/84  11/23/19 122/70   Lab Results  Component Value Date   CREATININE 0.96 11/13/2019   2. Low back pain With exercise.  Consider pilates.  Handout given:  [facility] Exercise. We will continue to monitor symptoms as they relate to her weight loss journey.  3. Other specified hypothyroidism Medication: None.  This is in her chart on problem list.  Labs are normal.   Lab Results  Component Value Date   TSH 1.600 04/18/2019   4. History of breast cancer Status post Tamoxifen x 5 years.  5. At risk for activity intolerance Jennifer Frazier was given approximately 9 minutes of exercise intolerance counseling today. She is 44 y.o. female and has risk factors exercise intolerance including low back pain. We discussed intensive lifestyle modifications today with an emphasis on  specific weight loss instructions and strategies. Jennifer Frazier will slowly increase activity as tolerated.  6. Class 1 obesity with serious comorbidity and body mass index (BMI) of 30.0 to 30.9 in adult, unspecified obesity type  Course: Jennifer Frazier is currently in the action stage of change. As such, her goal is to continue with weight loss efforts.   Nutrition goals: She has agreed to keeping a food journal and adhering to recommended goals of 1400-1500 calories and 90 grams of protein.   Exercise goals: For substantial health benefits, adults should do at least 150 minutes (2 hours and 30 minutes) a week of moderate-intensity, or 75 minutes (1 hour and 15 minutes) a week of vigorous-intensity aerobic physical activity, or an equivalent combination of moderate- and vigorous-intensity aerobic activity. Aerobic activity should be performed in episodes of at least 10 minutes, and preferably, it should be spread throughout the week.  Behavioral modification strategies: increasing lean protein intake, decreasing simple carbohydrates, increasing vegetables and increasing water intake.  Jennifer Frazier has agreed to follow-up with our clinic in 2-3 weeks with Jennifer Frazier, Jennifer Frazier. She was informed of the importance of frequent follow-up visits to maximize her success with intensive lifestyle modifications for her multiple health conditions.   Objective:   Blood pressure 115/78, pulse 66, temperature 98.3 F (36.8 C), temperature source Oral, height 5\' 9"  (1.753 m), weight 208 lb (94.3 kg), last menstrual period 09/03/2017, SpO2 97 %. Body mass index is 30.72 kg/m.  General: Cooperative, alert, well developed, in no acute distress. HEENT: Conjunctivae and  lids unremarkable. Cardiovascular: Regular rhythm.  Lungs: Normal work of breathing. Neurologic: No focal deficits.   Lab Results  Component Value Date   CREATININE 0.96 11/13/2019   BUN 23 (H) 11/13/2019   NA 140 11/13/2019   K 3.7 11/13/2019   CL 105 11/13/2019    CO2 29 11/13/2019   Lab Results  Component Value Date   ALT 12 11/13/2019   AST 18 11/13/2019   ALKPHOS 78 11/13/2019   BILITOT 0.4 11/13/2019   Lab Results  Component Value Date   HGBA1C 4.9 04/18/2019   HGBA1C 5.0 12/08/2018   HGBA1C 5.1 12/16/2017   HGBA1C 5.3 06/24/2017   Lab Results  Component Value Date   INSULIN 6.7 04/18/2019   INSULIN 11.0 12/08/2018   INSULIN 13.5 12/16/2017   INSULIN 6.2 06/24/2017   Lab Results  Component Value Date   TSH 1.600 04/18/2019   Lab Results  Component Value Date   CHOL 142 04/18/2019   HDL 82 04/18/2019   LDLCALC 47 04/18/2019   TRIG 62 04/18/2019   Lab Results  Component Value Date   WBC 6.0 11/13/2019   HGB 13.2 11/13/2019   HCT 39.4 11/13/2019   MCV 85.1 11/13/2019   PLT 179 11/13/2019   Attestation Statements:   Reviewed by clinician on day of visit: allergies, medications, problem list, medical history, surgical history, family history, social history, and previous encounter notes.  I, Water quality scientist, CMA, am acting as transcriptionist for Briscoe Deutscher, DO  I have reviewed the above documentation for accuracy and completeness, and I agree with the above. Briscoe Deutscher, DO

## 2020-01-04 ENCOUNTER — Ambulatory Visit (INDEPENDENT_AMBULATORY_CARE_PROVIDER_SITE_OTHER): Payer: 59

## 2020-01-04 ENCOUNTER — Ambulatory Visit: Payer: 59

## 2020-01-04 ENCOUNTER — Other Ambulatory Visit: Payer: Self-pay

## 2020-01-04 ENCOUNTER — Ambulatory Visit (INDEPENDENT_AMBULATORY_CARE_PROVIDER_SITE_OTHER): Payer: 59 | Admitting: Podiatry

## 2020-01-04 DIAGNOSIS — M216X1 Other acquired deformities of right foot: Secondary | ICD-10-CM

## 2020-01-04 DIAGNOSIS — M2041 Other hammer toe(s) (acquired), right foot: Secondary | ICD-10-CM | POA: Diagnosis not present

## 2020-01-04 DIAGNOSIS — Q66222 Congenital metatarsus adductus, left foot: Secondary | ICD-10-CM

## 2020-01-04 DIAGNOSIS — M21611 Bunion of right foot: Secondary | ICD-10-CM | POA: Diagnosis not present

## 2020-01-04 DIAGNOSIS — M2011 Hallux valgus (acquired), right foot: Secondary | ICD-10-CM | POA: Diagnosis not present

## 2020-01-04 DIAGNOSIS — L84 Corns and callosities: Secondary | ICD-10-CM | POA: Diagnosis not present

## 2020-01-04 DIAGNOSIS — M21861 Other specified acquired deformities of right lower leg: Secondary | ICD-10-CM

## 2020-01-04 DIAGNOSIS — M2141 Flat foot [pes planus] (acquired), right foot: Secondary | ICD-10-CM

## 2020-01-04 DIAGNOSIS — Q66221 Congenital metatarsus adductus, right foot: Secondary | ICD-10-CM | POA: Diagnosis not present

## 2020-01-04 DIAGNOSIS — R52 Pain, unspecified: Secondary | ICD-10-CM

## 2020-01-04 DIAGNOSIS — M2142 Flat foot [pes planus] (acquired), left foot: Secondary | ICD-10-CM

## 2020-01-04 NOTE — Progress Notes (Signed)
  Subjective:  Patient ID: Jennifer Frazier, female    DOB: 12-16-1975,  MRN: [id number]  Chief Complaint  Patient presents with  . Routine foot care    3 month rfc     44 y.o. female returns with the above complaint. History confirmed with patient. She has been using the metatarsal pads in her shoes as well and this has been helpful. Her insurance will not cover orthotics.  Calluses have become painful again  Objective:  Physical Exam: warm, good capillary refill, no trophic changes or ulcerative lesions, normal DP and PT pulses and normal sensory exam.  Bilaterally she has hallux valgus with bunions, mild hammertoes, tailor's bunions and prominent fifth metatarsal bases, suspect metatarsus adductus is present as well.  Bilateral mild pes planus.  Calluses submet 2, 5 bilaterally, she also has right foot calluses on the fifth metatarsal base and medial hallux   Radiographs of the right foot were taken: Moderate hallux valgus with metatarsus adductus and increased hallux abductus angle.  The second metatarsal is elongated hammertoe contractures are present  Assessment:   1. Hammertoe of right foot   2. Hallux valgus with bunions, right   3. Pes planus of both feet   4. Gastrocnemius equinus of right lower extremity   5. Metatarsus adductus of both feet      Plan:  Patient was evaluated and treated and all questions answered.  All symptomatic hyperkeratoses were safely debrided with a sterile #15 blade to patient's level of comfort without incident. We discussed preventative and palliative care of these lesions including supportive and accommodative shoegear, padding, prefabricated and custom molded accommodative orthoses, use of a pumice stone and lotions/creams daily.   Discussed the foot deformity she has that is contributing to the callus formation including metatarsus adductus, hallux valgus and hammertoe contractures.  We discussed surgical correction of these and what the  expected postoperative recovery would be.  We discussed that could consider distal metatarsal osteotomy through small incisions, however I think the Lapidus bunionectomy would probably offer her the best correction in the least chance of recurrence throughout her lifetime given her metatarsus adductus.  This would be combined with hammertoe correction as well as an osteotomy of the second metatarsal to shorten and bring it more in line with the first and third rays.  She is not ready to proceed with surgery and would like to hold off for now we will revisit this in the future.   Return in about 3 months (around 04/03/2020), or if symptoms worsen or fail to improve.

## 2020-01-30 ENCOUNTER — Encounter (INDEPENDENT_AMBULATORY_CARE_PROVIDER_SITE_OTHER): Payer: Self-pay | Admitting: Family Medicine

## 2020-01-30 ENCOUNTER — Other Ambulatory Visit: Payer: Self-pay

## 2020-01-30 ENCOUNTER — Ambulatory Visit (INDEPENDENT_AMBULATORY_CARE_PROVIDER_SITE_OTHER): Payer: 59 | Admitting: Family Medicine

## 2020-01-30 VITALS — BP 111/76 | HR 75 | Temp 98.3°F | Ht 69.0 in | Wt 208.0 lb

## 2020-01-30 DIAGNOSIS — R232 Flushing: Secondary | ICD-10-CM | POA: Diagnosis not present

## 2020-01-30 DIAGNOSIS — Z6830 Body mass index (BMI) 30.0-30.9, adult: Secondary | ICD-10-CM

## 2020-01-30 DIAGNOSIS — E669 Obesity, unspecified: Secondary | ICD-10-CM

## 2020-01-30 DIAGNOSIS — G4733 Obstructive sleep apnea (adult) (pediatric): Secondary | ICD-10-CM

## 2020-01-30 DIAGNOSIS — E8881 Metabolic syndrome: Secondary | ICD-10-CM | POA: Diagnosis not present

## 2020-01-30 DIAGNOSIS — I10 Essential (primary) hypertension: Secondary | ICD-10-CM

## 2020-01-30 DIAGNOSIS — E88819 Insulin resistance, unspecified: Secondary | ICD-10-CM

## 2020-01-30 MED ORDER — AMLODIPINE BESYLATE 5 MG PO TABS: 5.0000 mg | ORAL_TABLET | Freq: Every day | ORAL | 0 refills | Status: DC

## 2020-01-31 LAB — COMPREHENSIVE METABOLIC PANEL
ALT: 15 IU/L (ref 0–32)
AST: 22 IU/L (ref 0–40)
Albumin/Globulin Ratio: 1.3 (ref 1.2–2.2)
Albumin: 4.3 g/dL (ref 3.8–4.8)
Alkaline Phosphatase: 91 IU/L (ref 44–121)
BUN/Creatinine Ratio: 16 (ref 9–23)
BUN: 16 mg/dL (ref 6–24)
Bilirubin Total: 0.5 mg/dL (ref 0.0–1.2)
CO2: 25 mmol/L (ref 20–29)
Calcium: 9.9 mg/dL (ref 8.7–10.2)
Chloride: 105 mmol/L (ref 96–106)
Creatinine, Ser: 1 mg/dL (ref 0.57–1.00)
GFR calc Af Amer: 78 mL/min/{1.73_m2} (ref >59)
GFR calc non Af Amer: 68 mL/min/{1.73_m2} (ref >59)
Globulin, Total: 3.3 g/dL (ref 1.5–4.5)
Glucose: 72 mg/dL (ref 65–99)
Potassium: 3.8 mmol/L (ref 3.5–5.2)
Sodium: 145 mmol/L — ABNORMAL HIGH (ref 134–144)
Total Protein: 7.6 g/dL (ref 6.0–8.5)

## 2020-01-31 LAB — HEMOGLOBIN A1C
Est. average glucose Bld gHb Est-mCnc: 97 mg/dL
Hgb A1c MFr Bld: 5 % (ref 4.8–5.6)

## 2020-01-31 LAB — INSULIN, RANDOM: INSULIN: 4.5 u[IU]/mL (ref 2.6–24.9)

## 2020-02-01 ENCOUNTER — Encounter (INDEPENDENT_AMBULATORY_CARE_PROVIDER_SITE_OTHER): Payer: Self-pay | Admitting: Family Medicine

## 2020-02-01 NOTE — Progress Notes (Signed)
Chief Complaint:   OBESITY Jennifer Frazier is here to discuss her progress with her obesity treatment plan along with follow-up of her obesity related diagnoses. Jennifer Frazier is journaling approximately 90% of the time. Jennifer Frazier states she is not getting any exercise at this time.  Today's visit was #: 51 Starting weight: 294 lbs Starting date: 06/24/2017 Today's weight: 208 Today's date: 01/30/2020 Total lbs lost to date: 86 Total lbs lost since last in-office visit: 0  Interim History: Jennifer Frazier has maintained her weight today. She is journaling constantly. Her weight has been plateaued for 2 months. IC was repeated today and RMR has decreased to 1463 from 2008 (12/08/18), a decrease of 545 kcal. She is averaging 85 grams of protein daily.  Subjective:   1. OSA (obstructive sleep apnea) Jennifer Frazier has a diagnosis of sleep apnea.  Jennifer Frazier is diagnosed with sleep apnea but has not followed through with CPAP therapy. She notes fatigue and sleep quality is poor. She notes multiple interruptions to her sleep such as getting up to urinate, caring for her mom, etc. She has lost 43 lbs since her sleep study in Feb 2020 so it may have improved from her weight loss.  2. Essential hypertension Notes some mild ankle/foot edema. Well controlled on Amlodipine 10 mg daily. BP has trended down with weight loss.  BP Readings from Last 3 Encounters:  01/30/20 111/76  01/02/20 115/78  12/14/19 132/84   3. Insulin resistance Appetite mostly controlled.  Jennifer Frazier is not on Metformin. Last fasting insulin was 6.7, improved from a high of 13.5 on 12/16/2017.  Lab Results  Component Value Date   INSULIN 4.5 01/30/2020   INSULIN 6.7 04/18/2019   INSULIN 11.0 12/08/2018   INSULIN 13.5 12/16/2017   INSULIN 6.2 06/24/2017   Lab Results  Component Value Date   HGBA1C 5.0 01/30/2020   4. Hot flashes Jennifer Frazier notes hot flashes that started 3 weeks ago.Tamoxifen therapy finished in Sept 2021. She has been on Effexor for hot flashes  during chemo which was effective.   Assessment/Plan:   1. OSA (obstructive sleep apnea)  We will continue to monitor. Will suggest repeat sleep study when she reached weight goal.  IC preformed today.   2. Essential hypertension  Will decrease dose of amlodipine. Amlodipine 5 mg daily #30 with 0 refill.  - amLODipine (NORVASC) 5 MG tablet; Take 1 tablet (5 mg total) by mouth daily.  Dispense: 30 tablet; Refill: 0  3. Insulin resistance Jennifer Frazier will continue to work on weight loss, exercise, and decreasing simple carbohydrates to help decrease the risk of diabetes.  - Insulin, random - Hemoglobin A1c - Comprehensive metabolic panel Will check fasting glucose/insulin and A1c today. 4. Hot flashes  Jennifer Frazier will contact Dr Darnelle Catalan to discuss.  Jennifer Frazier is currently in the action stage of change. As such, her goal is to continue with weight loss efforts. She has agreed to keeping a food journal and adhering to recommended goals of 1200-1300  calories and 75 grams of  protein daily.   Exercise goals: Jennifer Frazier is to resume walking.  Behavioral modification strategies: increasing lean protein intake.  Jennifer Frazier has agreed to follow-up with our clinic in 3 weeks.  Jennifer Frazier was informed we would discuss her lab results at her next visit unless there is a critical issue that needs to be addressed sooner. Jennifer Frazier agreed to keep her next visit at the agreed upon time to discuss these results.  Objective:   Blood pressure 111/76, pulse 75, temperature 98.3  F (36.8 C), height 5\' 9"  (1.753 m), weight 208 lb (94.3 kg), last menstrual period 09/03/2017, SpO2 100 %. Body mass index is 30.72 kg/m.  General: Cooperative, alert, well developed, in no acute distress. HEENT: Conjunctivae and lids unremarkable. Cardiovascular: Regular rhythm.  Lungs: Normal work of breathing. Neurologic: No focal deficits.   Lab Results  Component Value Date   CREATININE 1.00 01/30/2020   BUN 16 01/30/2020   NA 145 (H)  01/30/2020   K 3.8 01/30/2020   CL 105 01/30/2020   CO2 25 01/30/2020   Lab Results  Component Value Date   ALT 15 01/30/2020   AST 22 01/30/2020   ALKPHOS 91 01/30/2020   BILITOT 0.5 01/30/2020   Lab Results  Component Value Date   HGBA1C 5.0 01/30/2020   HGBA1C 4.9 04/18/2019   HGBA1C 5.0 12/08/2018   HGBA1C 5.1 12/16/2017   HGBA1C 5.3 06/24/2017   Lab Results  Component Value Date   INSULIN 4.5 01/30/2020   INSULIN 6.7 04/18/2019   INSULIN 11.0 12/08/2018   INSULIN 13.5 12/16/2017   INSULIN 6.2 06/24/2017   Lab Results  Component Value Date   TSH 1.600 04/18/2019   Lab Results  Component Value Date   CHOL 142 04/18/2019   HDL 82 04/18/2019   LDLCALC 47 04/18/2019   TRIG 62 04/18/2019   Lab Results  Component Value Date   WBC 6.0 11/13/2019   HGB 13.2 11/13/2019   HCT 39.4 11/13/2019   MCV 85.1 11/13/2019   PLT 179 11/13/2019   No results found for: IRON, TIBC, FERRITIN   I, Para March, am acting as Location manager for [employer], FNP.  I have reviewed the above documentation for accuracy and completeness, and I agree with the above. -  Georgianne Fick, FNP

## 2020-02-13 ENCOUNTER — Encounter (INDEPENDENT_AMBULATORY_CARE_PROVIDER_SITE_OTHER): Payer: Self-pay

## 2020-02-14 ENCOUNTER — Encounter (INDEPENDENT_AMBULATORY_CARE_PROVIDER_SITE_OTHER): Payer: Self-pay | Admitting: Family Medicine

## 2020-02-14 ENCOUNTER — Other Ambulatory Visit: Payer: Self-pay

## 2020-02-14 ENCOUNTER — Telehealth (INDEPENDENT_AMBULATORY_CARE_PROVIDER_SITE_OTHER): Payer: 59 | Admitting: Family Medicine

## 2020-02-14 DIAGNOSIS — E8881 Metabolic syndrome: Secondary | ICD-10-CM | POA: Diagnosis not present

## 2020-02-14 DIAGNOSIS — G4719 Other hypersomnia: Secondary | ICD-10-CM

## 2020-02-14 DIAGNOSIS — F3289 Other specified depressive episodes: Secondary | ICD-10-CM | POA: Diagnosis not present

## 2020-02-14 DIAGNOSIS — I10 Essential (primary) hypertension: Secondary | ICD-10-CM | POA: Diagnosis not present

## 2020-02-14 DIAGNOSIS — E88819 Insulin resistance, unspecified: Secondary | ICD-10-CM

## 2020-02-14 DIAGNOSIS — E669 Obesity, unspecified: Secondary | ICD-10-CM

## 2020-02-14 DIAGNOSIS — Z6830 Body mass index (BMI) 30.0-30.9, adult: Secondary | ICD-10-CM

## 2020-02-15 NOTE — Progress Notes (Signed)
TeleHealth Visit:  Due to the COVID-19 pandemic, this visit was completed with telemedicine (audio/video) technology to reduce patient and provider exposure as well as to preserve personal protective equipment.   Jennifer Frazier has verbally consented to this TeleHealth visit. The patient is located at home, the provider is located at the [facility] office. The participants in this visit include the listed provider and patient. The visit was conducted today via MyChart video.   Chief Complaint: OBESITY Cortny is here to discuss her progress with her obesity treatment plan along with follow-up of her obesity related diagnoses. Jennifer Frazier is on keeping a food journal and adhering to recommended goals of 1200-1300 calories and 75 grams of protein daily and states she is following her eating plan approximately 90% of the time. Jennifer Frazier states she is walked for 20 minutes 4 times per week.  Today's visit was #: 80 Starting weight: 294 lbs Starting date: 06/24/2017  Interim History: Jennifer Frazier notes that eating only 1300 calories per day has been a struggle. We reduced her calories based on reduced RMR (IC last office visit). RMR was 1463, down from 2008 11/10/18. She is only eating breakfast and dinner. She feels she has cravings versus hunger. She averages 1300-1400 calories daily, and protein is 80-85 grams daily. Her weight is 210 lbs at home today. She tends to eat too many simple carbohydrates (sweets). She has done exceptionally well on our program and has lost 86 lbs since 06/24/17. She says sometimes she just gets tired of the foods she eats and her diet in general.  Here exercise has been inconsistent recently.  Subjective:   1. Essential hypertension [name]'s blood pressure is 132/88 at home. We decreased dose of Norvasc at her last office visit to 5 mg. She notes mild ankle and foot edema.  2. Insulin resistance Jennifer Frazier's fasting insulin has decreased to 4.5 from 13.5 2 years ago. Last A1c was  5.0. I discussed labs with the patient today.  3. Excessive daytime sleepiness Jennifer Frazier notes excessive daytime sleepiness. She has trouble staying awake when working. Her sleep at night is broken and she gets only 5 hours at night. She has obstructive sleep apnea, but does not have CPAP as recommended. She has lost quite a bit of weight since her sleep study, however. She was prescribed sleep medications but never received the prescription.   4. Other depression, with emotional eating Jennifer Frazier notes cravings during the day and needs to "crunch" on something.  Assessment/Plan:   1. Essential hypertension Jennifer Frazier will continue Norvasc 5 mg daily. She will continue working on healthy weight loss and exercise to improve blood pressure control. We will watch for signs of hypotension as she continues her lifestyle modifications.  2. Insulin resistance Jennifer Frazier will continue her meal plan, and will continue to work on weight loss, exercise, and decreasing simple carbohydrates to help decrease the risk of diabetes. Jennifer Frazier agreed to follow-up with [name] as directed to closely monitor her progress.  3. Excessive daytime sleepiness [name] will follow up with her primary care physician regarding sleep medicine, and she is to consider getting CPAP. She has been resistant to this. We have discussed good sleep hygiene in the past and she feels she practices this.   4. Other depression, with emotional eating Behavior modification techniques were discussed today to help Jennifer Frazier deal with her emotional/non-hunger eating behaviors.    5. Class 1 obesity with serious comorbidity and body mass index (BMI) of 30.0 to 30.9 in adult,  unspecified obesity type Jennifer Frazier is currently in the action stage of change. As such, her goal is to continue with weight loss efforts. She has agreed to keeping a food journal and adhering to recommended goals of 1200-1300 calories and 80 grams of protein daily.   Exercise goals:  Be more consistent  with exercise.  Behavioral modification strategies: increasing lean protein intake, decreasing simple carbohydrates, no skipping meals and planning for success.  Jennifer Frazier has agreed to follow-up with our clinic in 2 to 3 weeks. She was informed of the importance of frequent follow-up visits to maximize her success with intensive lifestyle modifications for her multiple health conditions.  Objective:   VITALS: Per patient if applicable, see vitals. GENERAL: Alert and in no acute distress. CARDIOPULMONARY: No increased WOB. Speaking in clear sentences.  PSYCH: Pleasant and cooperative. Speech normal rate and rhythm. Affect is appropriate. Insight and judgement are appropriate. Attention is focused, linear, and appropriate.  NEURO: Oriented as arrived to appointment on time with no prompting.   Lab Results  Component Value Date   CREATININE 1.00 01/30/2020   BUN 16 01/30/2020   NA 145 (H) 01/30/2020   K 3.8 01/30/2020   CL 105 01/30/2020   CO2 25 01/30/2020   Lab Results  Component Value Date   ALT 15 01/30/2020   AST 22 01/30/2020   ALKPHOS 91 01/30/2020   BILITOT 0.5 01/30/2020   Lab Results  Component Value Date   HGBA1C 5.0 01/30/2020   HGBA1C 4.9 04/18/2019   HGBA1C 5.0 12/08/2018   HGBA1C 5.1 12/16/2017   HGBA1C 5.3 06/24/2017   Lab Results  Component Value Date   INSULIN 4.5 01/30/2020   INSULIN 6.7 04/18/2019   INSULIN 11.0 12/08/2018   INSULIN 13.5 12/16/2017   INSULIN 6.2 06/24/2017   Lab Results  Component Value Date   TSH 1.600 04/18/2019   Lab Results  Component Value Date   CHOL 142 04/18/2019   HDL 82 04/18/2019   LDLCALC 47 04/18/2019   TRIG 62 04/18/2019   Lab Results  Component Value Date   WBC 6.0 11/13/2019   HGB 13.2 11/13/2019   HCT 39.4 11/13/2019   MCV 85.1 11/13/2019   PLT 179 11/13/2019   No results found for: IRON, TIBC, FERRITIN  Attestation Statements:   Reviewed by clinician on day of visit: allergies, medications, problem  list, medical history, surgical history, family history, social history, and previous encounter notes.   Wilhemena Durie, am acting as Location manager for [employer], FNP-C.  I have reviewed the above documentation for accuracy and completeness, and I agree with the above. - Georgianne Fick, FNP

## 2020-02-17 ENCOUNTER — Encounter (INDEPENDENT_AMBULATORY_CARE_PROVIDER_SITE_OTHER): Payer: Self-pay | Admitting: Family Medicine

## 2020-02-27 IMAGING — MG MM DIGITAL DIAGNOSTIC BILAT W/ TOMO W/ CAD
6 of 9 series · 6 of 25 positions shown · non-contrast
Comparison: Previous exam(s).

CLINICAL DATA: Status post right lumpectomy for breast carcinoma
performed in July 2013 with adjuvant radiation and chemotherapy. No
current breast complaints.

EXAM:
DIGITAL DIAGNOSTIC BILATERAL MAMMOGRAM WITH CAD AND TOMO

[R MLO]
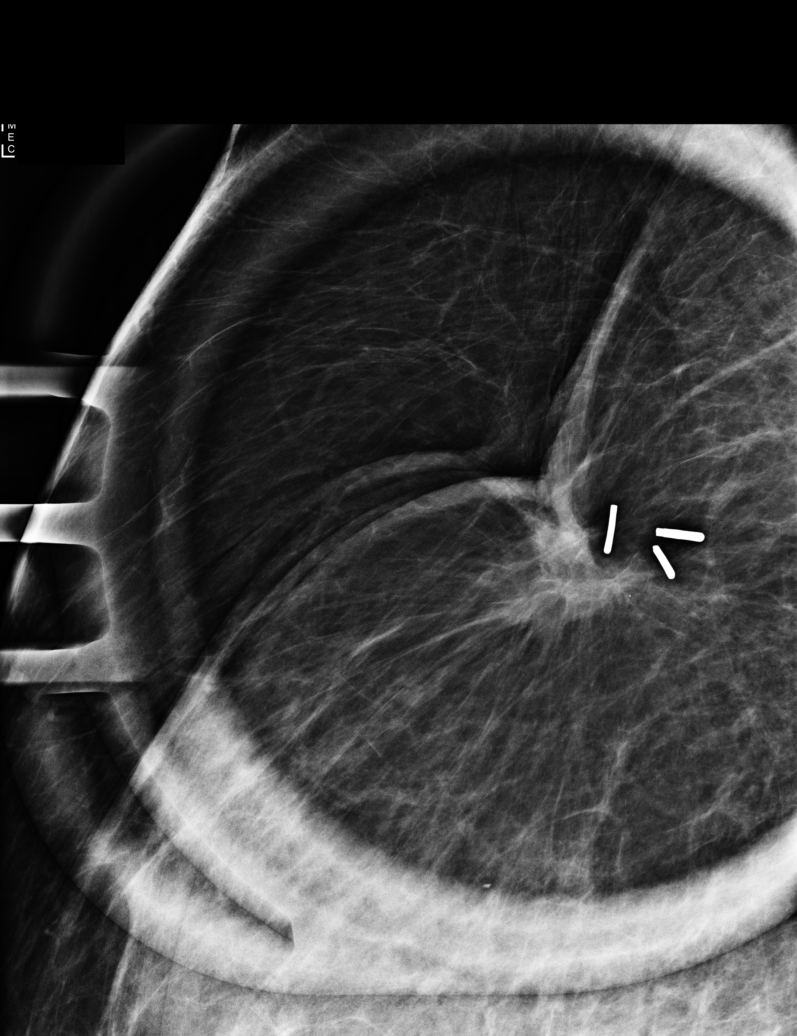

[L CC synth-2D]
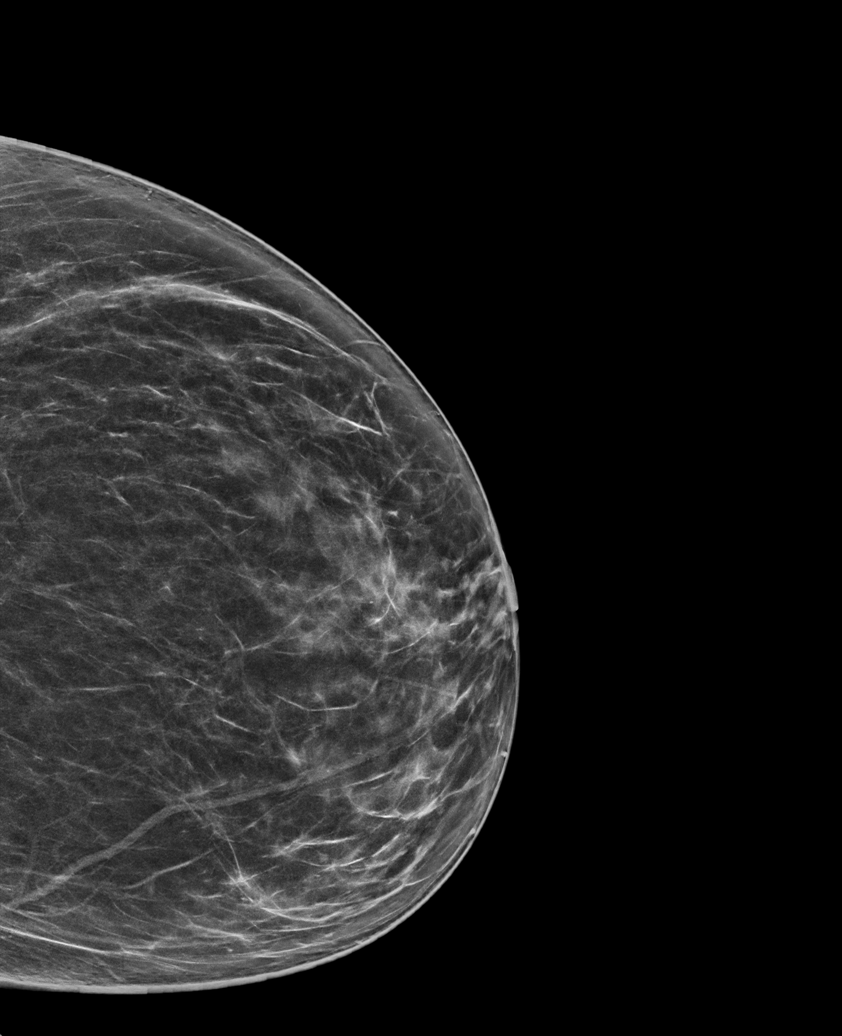

[L MLO synth-2D]
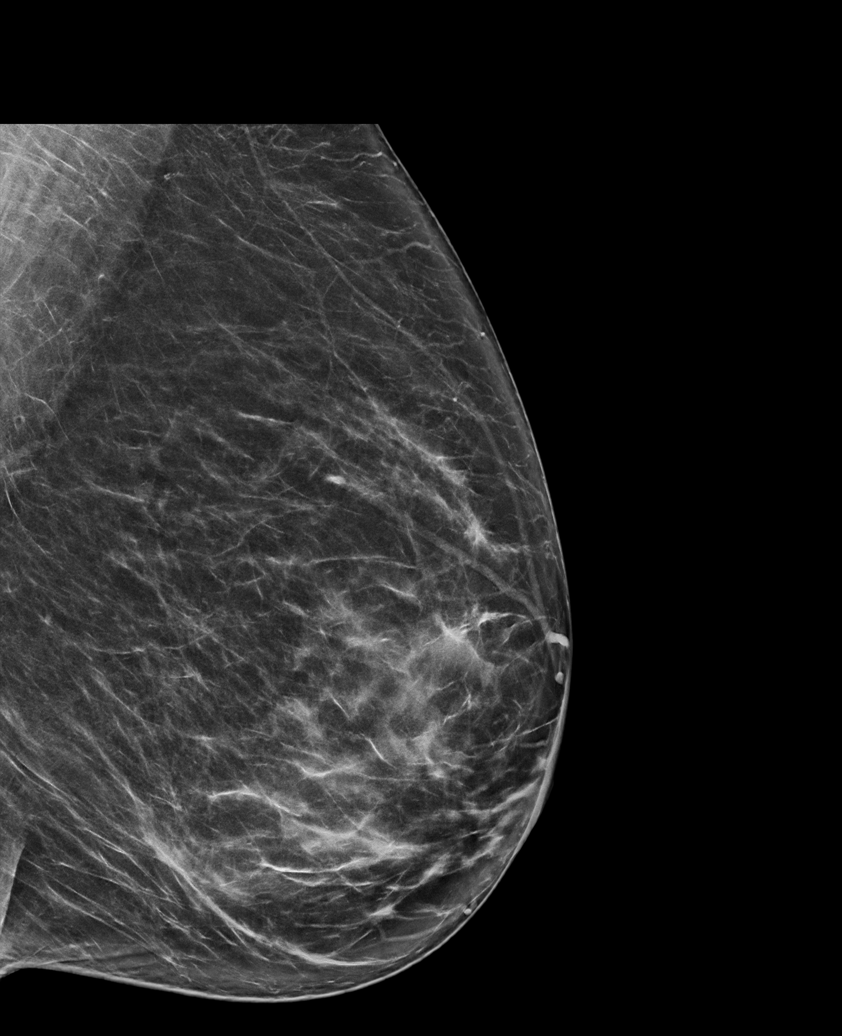

[R CC synth-2D]
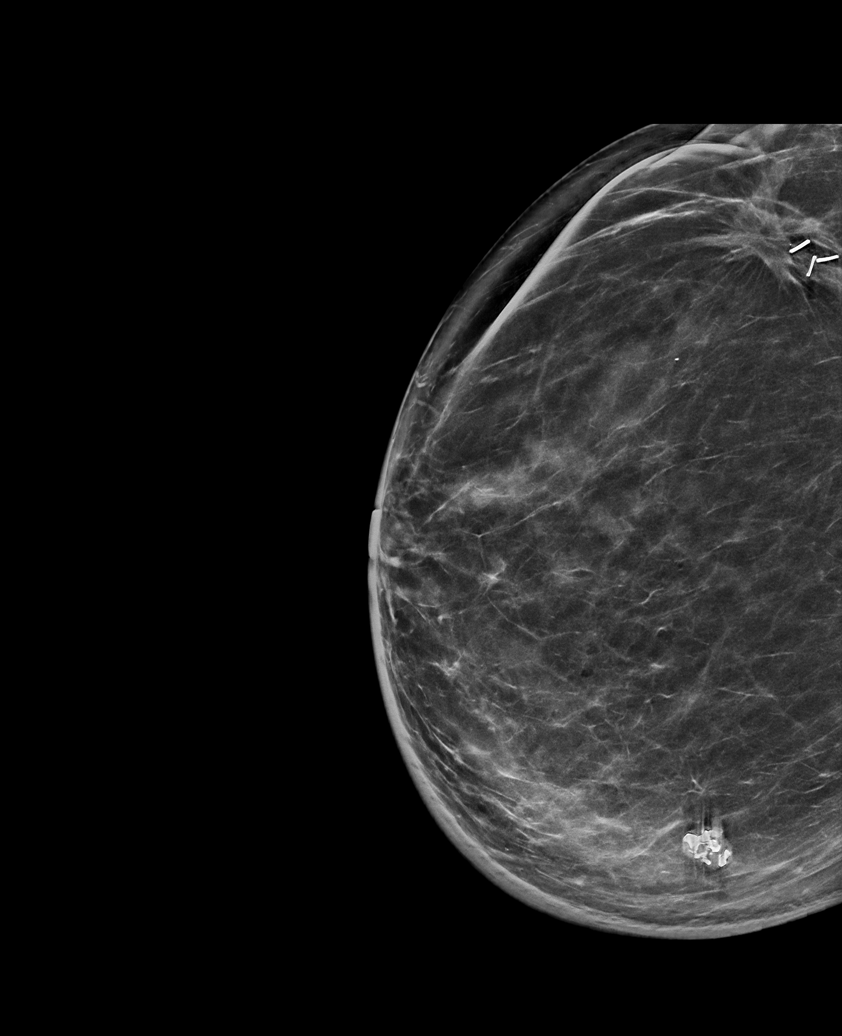

[R MLO synth-2D]
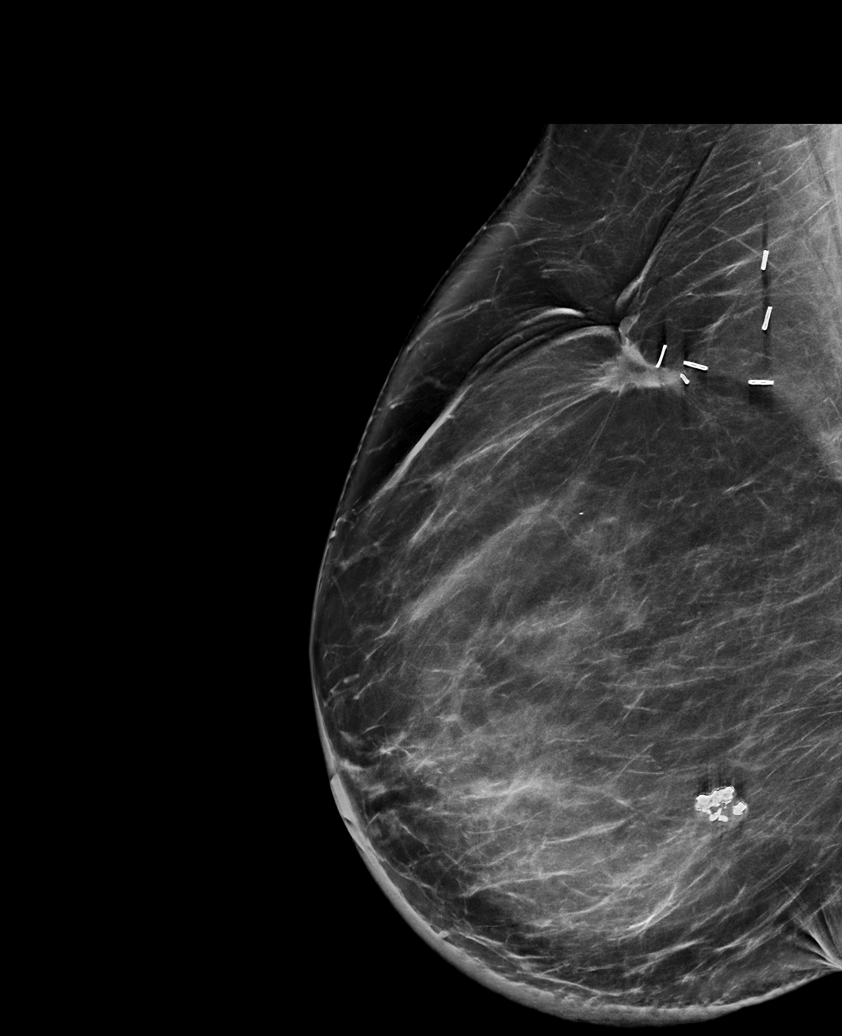

[R MLO tomo · tomo slice 51/101.0]
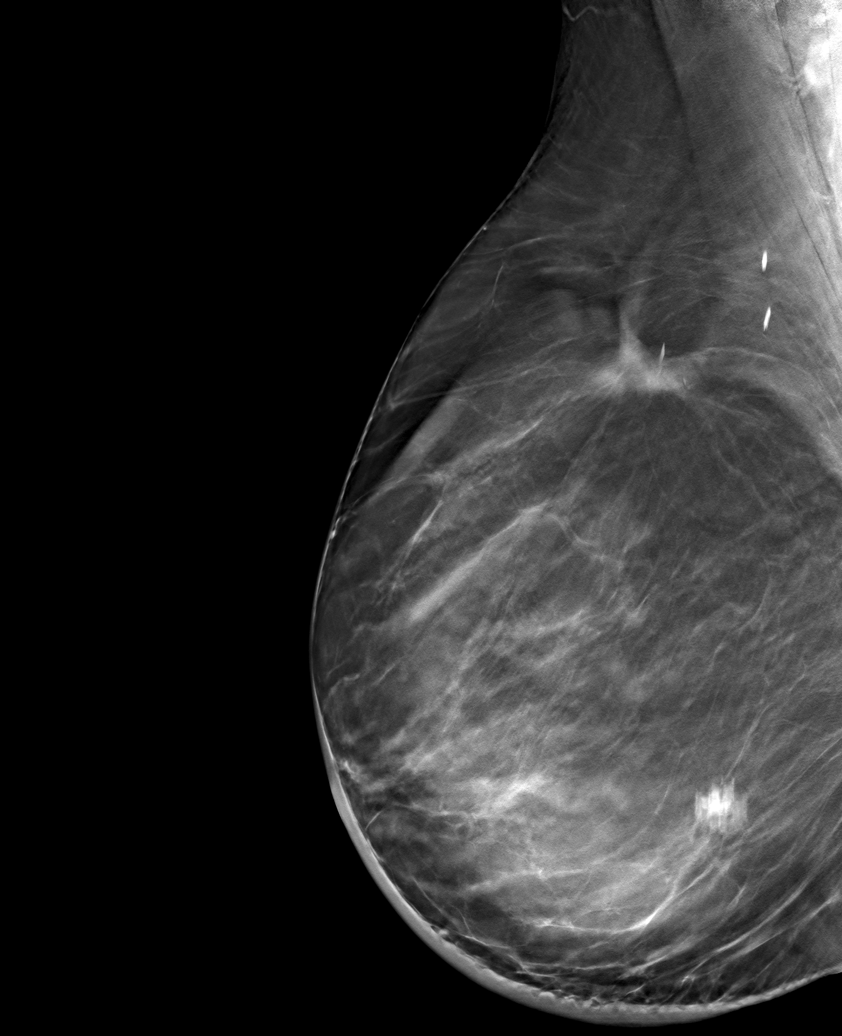

[6 of 25 positions shown; findings below may reference images not displayed]

ACR Breast Density Category b: There are scattered areas of
fibroglandular density.
FINDINGS: Postsurgical changes in the upper outer right breast are stable.

There are no masses or areas of nonsurgical architectural
distortion. There are no areas of significant asymmetry and no new
or suspicious calcifications.

Mammographic images were processed with CAD.
IMPRESSION: 1. No evidence of new or recurrent breast carcinoma.
2. Benign postsurgical changes on the right.

RECOMMENDATION:
Screening mammogram in one year.(Code:2G-[id number])

I have discussed the findings and recommendations with the patient.
If applicable, a reminder letter will be sent to the patient
regarding the next appointment.

BI-RADS CATEGORY  2: Benign.

## 2020-03-06 ENCOUNTER — Encounter (INDEPENDENT_AMBULATORY_CARE_PROVIDER_SITE_OTHER): Payer: Self-pay | Admitting: Family Medicine

## 2020-03-06 ENCOUNTER — Ambulatory Visit (INDEPENDENT_AMBULATORY_CARE_PROVIDER_SITE_OTHER): Payer: 59 | Admitting: Family Medicine

## 2020-03-06 ENCOUNTER — Other Ambulatory Visit: Payer: Self-pay

## 2020-03-06 VITALS — BP 118/78 | HR 73 | Temp 97.8°F | Ht 69.0 in | Wt 207.0 lb

## 2020-03-06 DIAGNOSIS — E8881 Metabolic syndrome: Secondary | ICD-10-CM | POA: Diagnosis not present

## 2020-03-06 DIAGNOSIS — I10 Essential (primary) hypertension: Secondary | ICD-10-CM | POA: Diagnosis not present

## 2020-03-06 DIAGNOSIS — Z9189 Other specified personal risk factors, not elsewhere classified: Secondary | ICD-10-CM

## 2020-03-06 DIAGNOSIS — Z6830 Body mass index (BMI) 30.0-30.9, adult: Secondary | ICD-10-CM

## 2020-03-06 DIAGNOSIS — E88819 Insulin resistance, unspecified: Secondary | ICD-10-CM

## 2020-03-06 DIAGNOSIS — E669 Obesity, unspecified: Secondary | ICD-10-CM

## 2020-03-06 MED ORDER — AMLODIPINE BESYLATE 5 MG PO TABS: 5.0000 mg | ORAL_TABLET | Freq: Every day | ORAL | 0 refills | Status: DC

## 2020-03-07 ENCOUNTER — Encounter (INDEPENDENT_AMBULATORY_CARE_PROVIDER_SITE_OTHER): Payer: Self-pay | Admitting: Family Medicine

## 2020-03-07 NOTE — Progress Notes (Signed)
Chief Complaint:   OBESITY Jennifer Frazier is here to discuss her progress with her obesity treatment plan along with follow-up of her obesity related diagnoses. Jennifer Frazier is on keeping a food journal and adhering to recommended goals of 1200-1300 calories and 80 grams of protein daily and states she is following her eating plan approximately 90% of the time. Jennifer Frazier states she is doing cardio and weights for 30 minutes 5 times per week.  Today's visit was #: 74 Starting weight: 294 lbs Starting date: 06/24/2017 Today's weight: 207 lbs Today's date: 03/06/2020 Total lbs lost to date: 87 Total lbs lost since last in-office visit: 1  Interim History: Jennifer Frazier has increased her exercise and is being more consistent with this. She feels she needs to get even better with exercise. She feels she has drifted from healthier foods and eats too many sweets. She tends to eat the bare minimum of healthy foods to meet her protein goals. She is eating only 2 meals per day.  Subjective:   1. Insulin resistance Jennifer Frazier notes hunger 1 hour after dinner. She does eat 30 grams of protein at supper. She continues to work on diet and exercise to decrease her risk of diabetes.  Lab Results  Component Value Date   INSULIN 4.5 01/30/2020   INSULIN 6.7 04/18/2019   INSULIN 11.0 12/08/2018   INSULIN 13.5 12/16/2017   INSULIN 6.2 06/24/2017   Lab Results  Component Value Date   HGBA1C 5.0 01/30/2020   2. Essential hypertension Jennifer Frazier's blood pressure is well controlled. She denies chest pain or shortness of breath. She is on Norvasc 5 mg, which was recently decreased from 10 mg daily. Her foot and ankle swelling are better.   BP Readings from Last 3 Encounters:  03/06/20 118/78  01/30/20 111/76  01/02/20 115/78   Lab Results  Component Value Date   CREATININE 1.00 01/30/2020   CREATININE 0.96 11/13/2019   CREATININE 0.97 04/18/2019   3. At risk for malnutrition Jennifer Frazier is at increased risk for malnutrition due to  inadequate protein intake, fruits, and vegetables.  Assessment/Plan:   1. Insulin resistance Jennifer Frazier will continue her meal plan, and will continue to work on weight loss, exercise, and decreasing simple carbohydrates to help decrease the risk of diabetes.  2. Essential hypertension Jennifer Frazier is working on healthy weight loss and exercise to improve blood pressure control. We will watch for signs of hypotension as she continues her lifestyle modifications. We will refill Norvasc for 1 month.  - amLODipine (NORVASC) 5 MG tablet; Take 1 tablet (5 mg total) by mouth daily.  Dispense: 30 tablet; Refill: 0  3. At risk for malnutrition Jennifer Frazier was given approximately 15 minutes of counseling today regarding prevention of malnutrition and ways to meet macronutrient goals.   4. Class 1 obesity with serious comorbidity and body mass index (BMI) of 30.0 to 30.9 in adult, unspecified obesity type Jennifer Frazier is currently in the action stage of change. As such, her goal is to continue with weight loss efforts. She has agreed to keeping a food journal and adhering to recommended goals of 1200-1300 calories and 80 grams of protein daily.  I encouraged her to eat 3 meals per day to reduce evening hunger.   Exercise goals: As is.  Behavioral modification strategies: meal planning and cooking strategies.  Jennifer Frazier has agreed to follow-up with our clinic in 3 weeks.   Objective:   Blood pressure 118/78, pulse 73, temperature 97.8 F (36.6 C), height 5\' 9"  (1.753  m), weight 207 lb (93.9 kg), last menstrual period 09/03/2017, SpO2 98 %. Body mass index is 30.57 kg/m.  General: Cooperative, alert, well developed, in no acute distress. HEENT: Conjunctivae and lids unremarkable. Cardiovascular: Regular rhythm.  Lungs: Normal work of breathing. Neurologic: No focal deficits.   Lab Results  Component Value Date   CREATININE 1.00 01/30/2020   BUN 16 01/30/2020   NA 145 (H) 01/30/2020   K 3.8 01/30/2020   CL 105  01/30/2020   CO2 25 01/30/2020   Lab Results  Component Value Date   ALT 15 01/30/2020   AST 22 01/30/2020   ALKPHOS 91 01/30/2020   BILITOT 0.5 01/30/2020   Lab Results  Component Value Date   HGBA1C 5.0 01/30/2020   HGBA1C 4.9 04/18/2019   HGBA1C 5.0 12/08/2018   HGBA1C 5.1 12/16/2017   HGBA1C 5.3 06/24/2017   Lab Results  Component Value Date   INSULIN 4.5 01/30/2020   INSULIN 6.7 04/18/2019   INSULIN 11.0 12/08/2018   INSULIN 13.5 12/16/2017   INSULIN 6.2 06/24/2017   Lab Results  Component Value Date   TSH 1.600 04/18/2019   Lab Results  Component Value Date   CHOL 142 04/18/2019   HDL 82 04/18/2019   LDLCALC 47 04/18/2019   TRIG 62 04/18/2019   Lab Results  Component Value Date   WBC 6.0 11/13/2019   HGB 13.2 11/13/2019   HCT 39.4 11/13/2019   MCV 85.1 11/13/2019   PLT 179 11/13/2019   No results found for: IRON, TIBC, FERRITIN  Attestation Statements:   Reviewed by clinician on day of visit: allergies, medications, problem list, medical history, surgical history, family history, social history, and previous encounter notes.   Wilhemena Durie, am acting as Location manager for [employer], FNP-C.  I have reviewed the above documentation for accuracy and completeness, and I agree with the above. -  Georgianne Fick, FNP

## 2020-03-27 ENCOUNTER — Encounter (INDEPENDENT_AMBULATORY_CARE_PROVIDER_SITE_OTHER): Payer: Self-pay | Admitting: Family Medicine

## 2020-03-27 ENCOUNTER — Ambulatory Visit (INDEPENDENT_AMBULATORY_CARE_PROVIDER_SITE_OTHER): Payer: 59 | Admitting: Family Medicine

## 2020-03-27 ENCOUNTER — Other Ambulatory Visit: Payer: Self-pay

## 2020-03-27 VITALS — BP 127/84 | HR 71 | Temp 98.1°F | Ht 69.0 in | Wt 211.0 lb

## 2020-03-27 DIAGNOSIS — E88819 Insulin resistance, unspecified: Secondary | ICD-10-CM

## 2020-03-27 DIAGNOSIS — E669 Obesity, unspecified: Secondary | ICD-10-CM | POA: Diagnosis not present

## 2020-03-27 DIAGNOSIS — E8881 Metabolic syndrome: Secondary | ICD-10-CM | POA: Diagnosis not present

## 2020-03-27 DIAGNOSIS — Z6831 Body mass index (BMI) 31.0-31.9, adult: Secondary | ICD-10-CM | POA: Diagnosis not present

## 2020-03-27 DIAGNOSIS — Z9189 Other specified personal risk factors, not elsewhere classified: Secondary | ICD-10-CM | POA: Diagnosis not present

## 2020-03-27 DIAGNOSIS — I10 Essential (primary) hypertension: Secondary | ICD-10-CM

## 2020-03-27 MED ORDER — AMLODIPINE BESYLATE 5 MG PO TABS
5.0000 mg | ORAL_TABLET | Freq: Every day | ORAL | 0 refills | Status: DC
Start: 2020-03-27 — End: 2020-04-22

## 2020-03-28 ENCOUNTER — Encounter (INDEPENDENT_AMBULATORY_CARE_PROVIDER_SITE_OTHER): Payer: Self-pay | Admitting: Family Medicine

## 2020-03-28 NOTE — Progress Notes (Signed)
Chief Complaint:   OBESITY Jennifer Frazier is here to discuss her progress with her obesity treatment plan along with follow-up of her obesity related diagnoses. Jennifer Frazier is on keeping a food journal and adhering to recommended goals of 1200-1300 calories and 80 grams of protein daily and states she is following her eating plan approximately 70% of the time. Jennifer Frazier states she is walking for 30 minutes 2+ times per week.  Today's visit was #: 54 Starting weight: 294 lbs Starting date: 06/24/2017 Today's weight: 211 lbs Today's date: 03/27/2020 Total lbs lost to date: 83 Total lbs lost since last in-office visit: 0  Interim History: Jennifer Frazier has a new job which has gotten her out of her normal routine. She is exercising less than usual, and she is still eating only 2 meals (breakfast and dinner). She is journaling, but only in her head. She had been writing it down. Her protein averages 80 grams or slightly below.  Subjective:   1. Essential hypertension Lumi's blood pressure is well controlled on Norvasc.  BP Readings from Last 3 Encounters:  03/27/20 127/84  03/06/20 118/78  01/30/20 111/76   Lab Results  Component Value Date   CREATININE 1.00 01/30/2020   CREATININE 0.96 11/13/2019   CREATININE 0.97 04/18/2019   2. Insulin resistance Temprance notes polyphagia. She does not what to take medications for hunger. Last fasting insulin was low at 4.5 (down from 13.5).   Lab Results  Component Value Date   INSULIN 4.5 01/30/2020   INSULIN 6.7 04/18/2019   INSULIN 11.0 12/08/2018   INSULIN 13.5 12/16/2017   INSULIN 6.2 06/24/2017   Lab Results  Component Value Date   HGBA1C 5.0 01/30/2020   3. At risk for activity intolerance Alli is at risk for exercise intolerance due to recent hiatus from exercise.  Assessment/Plan:   1. Essential hypertension  We will watch for signs of hypotension as she continues her lifestyle modifications. We will refill Norvasc for 1 month.  - amLODipine  (NORVASC) 5 MG tablet; Take 1 tablet (5 mg total) by mouth daily.  Dispense: 30 tablet; Refill: 0  2. Insulin resistance Jennifer Frazier will continue her meal plan, and will continue to work on weight loss, exercise, increasing protein, and decreasing simple carbohydrates to help decrease the risk of diabetes. Jennifer Frazier agreed to follow-up with Jennifer Frazier as directed to closely monitor her progress.  3. At risk for activity intolerance Jennifer Frazier was given approximately 15 minutes of exercise intolerance counseling today. She is 45 y.o. female and has risk factors exercise intolerance including obesity. We discussed intensive lifestyle modifications today with an emphasis on specific weight loss instructions and strategies. Jennifer Frazier will slowly increase activity as tolerated.  Repetitive spaced learning was employed today to elicit superior memory formation and behavioral change.  4. Class 1 obesity with serious comorbidity and body mass index (BMI) of 31.0 to 31.9 in adult, unspecified obesity type Jennifer Frazier is currently in the action stage of change. As such, her goal is to continue with weight loss efforts. She has agreed to keeping a food journal and adhering to recommended goals of 1200-1300 calories and 80 grams of protein daily.   Jennifer Frazier will start journaling on paper again.  Exercise goals: Add resistance 2 times per week for 15 minutes.  Behavioral modification strategies: increasing lean protein intake.  Jennifer Frazier has agreed to follow-up with our clinic in 3 weeks.   Objective:   Blood pressure 127/84, pulse 71, temperature 98.1 F (36.7 C), temperature source Oral,  height 5\' 9"  (1.753 m), weight 211 lb (95.7 kg), last menstrual period 09/03/2017, SpO2 99 %. Body mass index is 31.16 kg/m.  General: Cooperative, alert, well developed, in no acute distress. HEENT: Conjunctivae and lids unremarkable. Cardiovascular: Regular rhythm.  Lungs: Normal work of breathing. Neurologic: No focal deficits.   Lab Results   Component Value Date   CREATININE 1.00 01/30/2020   BUN 16 01/30/2020   NA 145 (H) 01/30/2020   K 3.8 01/30/2020   CL 105 01/30/2020   CO2 25 01/30/2020   Lab Results  Component Value Date   ALT 15 01/30/2020   AST 22 01/30/2020   ALKPHOS 91 01/30/2020   BILITOT 0.5 01/30/2020   Lab Results  Component Value Date   HGBA1C 5.0 01/30/2020   HGBA1C 4.9 04/18/2019   HGBA1C 5.0 12/08/2018   HGBA1C 5.1 12/16/2017   HGBA1C 5.3 06/24/2017   Lab Results  Component Value Date   INSULIN 4.5 01/30/2020   INSULIN 6.7 04/18/2019   INSULIN 11.0 12/08/2018   INSULIN 13.5 12/16/2017   INSULIN 6.2 06/24/2017   Lab Results  Component Value Date   TSH 1.600 04/18/2019   Lab Results  Component Value Date   CHOL 142 04/18/2019   HDL 82 04/18/2019   LDLCALC 47 04/18/2019   TRIG 62 04/18/2019   Lab Results  Component Value Date   WBC 6.0 11/13/2019   HGB 13.2 11/13/2019   HCT 39.4 11/13/2019   MCV 85.1 11/13/2019   PLT 179 11/13/2019   No results found for: IRON, TIBC, FERRITIN  Attestation Statements:   Reviewed by clinician on day of visit: allergies, medications, problem list, medical history, surgical history, family history, social history, and previous encounter notes.   Jennifer Frazier, am acting as Location manager for [employer], FNP-C.  I have reviewed the above documentation for accuracy and completeness, and I agree with the above. -  Georgianne Fick, FNP

## 2020-04-22 ENCOUNTER — Other Ambulatory Visit: Payer: Self-pay

## 2020-04-22 ENCOUNTER — Encounter (INDEPENDENT_AMBULATORY_CARE_PROVIDER_SITE_OTHER): Payer: Self-pay | Admitting: Family Medicine

## 2020-04-22 ENCOUNTER — Ambulatory Visit (INDEPENDENT_AMBULATORY_CARE_PROVIDER_SITE_OTHER): Payer: 59 | Admitting: Family Medicine

## 2020-04-22 VITALS — BP 111/72 | HR 76 | Temp 98.4°F | Ht 69.0 in | Wt 207.0 lb

## 2020-04-22 DIAGNOSIS — Z6841 Body Mass Index (BMI) 40.0 and over, adult: Secondary | ICD-10-CM

## 2020-04-22 DIAGNOSIS — E8881 Metabolic syndrome: Secondary | ICD-10-CM | POA: Diagnosis not present

## 2020-04-22 DIAGNOSIS — I10 Essential (primary) hypertension: Secondary | ICD-10-CM

## 2020-04-22 DIAGNOSIS — E88819 Insulin resistance, unspecified: Secondary | ICD-10-CM

## 2020-04-22 MED ORDER — AMLODIPINE BESYLATE 5 MG PO TABS: 5.0000 mg | ORAL_TABLET | Freq: Every day | ORAL | 0 refills | Status: DC

## 2020-04-24 ENCOUNTER — Encounter (INDEPENDENT_AMBULATORY_CARE_PROVIDER_SITE_OTHER): Payer: Self-pay | Admitting: Family Medicine

## 2020-04-24 NOTE — Progress Notes (Signed)
Chief Complaint:   OBESITY Jennifer Frazier is here to discuss her progress with her obesity treatment plan along with follow-up of her obesity related diagnoses. [name] is on keeping a food journal and adhering to recommended goals of 1200-1300 calories and 80 grams of protein daily and states she is following her eating plan approximately 90-95% of the time. Cambre states she is walking for 30 minutes 5 times per week.  Today's visit was #: 44 Starting weight: 294 lbs Starting date: 06/24/2017 Today's weight: 207 lbs Today's date: 04/22/2020 Total lbs lost to date: 87 Total lbs lost since last in-office visit: 4  Interim History: [name] says she has been really paying attention to journaling every little bit of food. She doesn't journal on the weekend, but she does stay mostly on the plan. Her hunger is mostly satisfied. Her protein intake is better for the most part.  Subjective:   1. Essential hypertension Jennifer Frazier's blood pressure is well controlled with Norvasc. Cardiovascular ROS: no chest pain or dyspnea on exertion.  BP Readings from Last 3 Encounters:  04/22/20 111/72  03/27/20 127/84  03/06/20 118/78   Lab Results  Component Value Date   CREATININE 1.00 01/30/2020   CREATININE 0.96 11/13/2019   CREATININE 0.97 04/18/2019   2. Insulin resistance Jennifer Frazier's last fasting insulin was within normal limits (4.5), and before that it was 11.0. Last A1c was 5.0. She was on metformin in the past but she took it sporadically.   Lab Results  Component Value Date   INSULIN 4.5 01/30/2020   INSULIN 6.7 04/18/2019   INSULIN 11.0 12/08/2018   INSULIN 13.5 12/16/2017   INSULIN 6.2 06/24/2017   Lab Results  Component Value Date   HGBA1C 5.0 01/30/2020   Assessment/Plan:   1. Essential hypertension We will refill Norvasc for 1 month.  - amLODipine (NORVASC) 5 MG tablet; Take 1 tablet (5 mg total) by mouth daily.  Dispense: 30 tablet; Refill: 0  2. Insulin resistance Jennifer Frazier will  continue her meal plan, and will continue to work on weight loss, exercise, and decreasing simple carbohydrates to help decrease the risk of diabetes.   3. Obesity: BMI 30 Jennifer Frazier is currently in the action stage of change. As such, her goal is to continue with weight loss efforts. She has agreed to keeping a food journal and adhering to recommended goals of 1200-1300 calories and 80 grams of protein daily.   Exercise goals: As is, add resistance 2 times per week.  Behavioral modification strategies: increasing lean protein intake, meal planning and cooking strategies and keeping a strict food journal.  Jennifer Frazier has agreed to follow-up with our clinic in 3 weeks.   Objective:   Blood pressure 111/72, pulse 76, temperature 98.4 F (36.9 C), height 5\' 9"  (1.753 m), weight 207 lb (93.9 kg), last menstrual period 09/03/2017, SpO2 97 %. Body mass index is 30.57 kg/m.  General: Cooperative, alert, well developed, in no acute distress. HEENT: Conjunctivae and lids unremarkable. Cardiovascular: Regular rhythm.  Lungs: Normal work of breathing. Neurologic: No focal deficits.   Lab Results  Component Value Date   CREATININE 1.00 01/30/2020   BUN 16 01/30/2020   NA 145 (H) 01/30/2020   K 3.8 01/30/2020   CL 105 01/30/2020   CO2 25 01/30/2020   Lab Results  Component Value Date   ALT 15 01/30/2020   AST 22 01/30/2020   ALKPHOS 91 01/30/2020   BILITOT 0.5 01/30/2020   Lab Results  Component Value Date  HGBA1C 5.0 01/30/2020   HGBA1C 4.9 04/18/2019   HGBA1C 5.0 12/08/2018   HGBA1C 5.1 12/16/2017   HGBA1C 5.3 06/24/2017   Lab Results  Component Value Date   INSULIN 4.5 01/30/2020   INSULIN 6.7 04/18/2019   INSULIN 11.0 12/08/2018   INSULIN 13.5 12/16/2017   INSULIN 6.2 06/24/2017   Lab Results  Component Value Date   TSH 1.600 04/18/2019   Lab Results  Component Value Date   CHOL 142 04/18/2019   HDL 82 04/18/2019   LDLCALC 47 04/18/2019   TRIG 62 04/18/2019   Lab  Results  Component Value Date   WBC 6.0 11/13/2019   HGB 13.2 11/13/2019   HCT 39.4 11/13/2019   MCV 85.1 11/13/2019   PLT 179 11/13/2019   No results found for: IRON, TIBC, FERRITIN  Attestation Statements:   Reviewed by clinician on day of visit: allergies, medications, problem list, medical history, surgical history, family history, social history, and previous encounter notes.   Wilhemena Durie, am acting as Location manager for [employer], FNP-C.  I have reviewed the above documentation for accuracy and completeness, and I agree with the above. -  Georgianne Fick, FNP

## 2020-05-14 ENCOUNTER — Other Ambulatory Visit: Payer: Self-pay

## 2020-05-14 ENCOUNTER — Encounter (INDEPENDENT_AMBULATORY_CARE_PROVIDER_SITE_OTHER): Payer: Self-pay | Admitting: Family Medicine

## 2020-05-14 ENCOUNTER — Ambulatory Visit (INDEPENDENT_AMBULATORY_CARE_PROVIDER_SITE_OTHER): Payer: 59 | Admitting: Family Medicine

## 2020-05-14 VITALS — BP 119/80 | HR 69 | Temp 98.2°F | Ht 69.0 in | Wt 208.0 lb

## 2020-05-14 DIAGNOSIS — I10 Essential (primary) hypertension: Secondary | ICD-10-CM

## 2020-05-14 DIAGNOSIS — Z6841 Body Mass Index (BMI) 40.0 and over, adult: Secondary | ICD-10-CM | POA: Diagnosis not present

## 2020-05-14 DIAGNOSIS — Z9189 Other specified personal risk factors, not elsewhere classified: Secondary | ICD-10-CM

## 2020-05-14 DIAGNOSIS — G4733 Obstructive sleep apnea (adult) (pediatric): Secondary | ICD-10-CM | POA: Diagnosis not present

## 2020-05-14 MED ORDER — AMLODIPINE BESYLATE 5 MG PO TABS: 5.0000 mg | ORAL_TABLET | Freq: Every day | ORAL | 0 refills | Status: DC

## 2020-05-14 NOTE — Progress Notes (Signed)
Chief Complaint:   OBESITY Jennifer Frazier is here to discuss her progress with her obesity treatment plan along with follow-up of her obesity related diagnoses. Jennifer Frazier is on keeping a food journal and adhering to recommended goals of 1200-1300 calories and 80 g protein and states she is following her eating plan approximately 90% of the time. Jennifer Frazier states she is walking and weight training 30 minutes 5 times per week.  Today's visit was #: 67 Starting weight: 294 lbs Starting date: 06/24/2017 Today's weight: 208 lbs Today's date: 05/14/2020 Total lbs lost to date: 86 lbs Total lbs lost since last in-office visit: 0  Interim History: Jennifer Frazier did have a meal off plan on Sunday. Her weight has been essentially plateaued since last August 2021 (weight was 208 lbs on 09/17/2020), and weight today is 208 lbs. She does not like meat that she cooks at home. However, she does like meat when she eats at [employer]. She eats a lot of meat substitutes and often struggles to reach protein goals.  Subjective:   1. Essential hypertension Jennifer Frazier's BP is well controlled. She is on Norvasc 5 mg.  BP Readings from Last 3 Encounters:  05/14/20 119/80  04/22/20 111/72  03/27/20 127/84   2. OSA (obstructive sleep apnea) Jennifer Frazier is not currently on CPAP. She has lost weight since being diagnosed with OSA.  3. At risk for impaired metabolic function Jennifer Frazier is at increased risk for impaired metabolic function due to inadequate protein.  Assessment/Plan:   1. Essential hypertension Refill:  - amLODipine (NORVASC) 5 MG tablet; Take 1 tablet (5 mg total) by mouth daily.  Dispense: 30 tablet; Refill: 0  2. OSA (obstructive sleep apnea) Intensive lifestyle modifications are the first line treatment for this issue. We discussed several lifestyle modifications today and she will continue to work on diet, exercise and weight loss efforts. We will continue to monitor. Orders and follow up as documented in patient  record. Continue to monitor.  3. At risk for impaired metabolic function Jennifer Frazier was given approximately 15 minutes of impaired  metabolic function prevention counseling today. We discussed intensive lifestyle modifications today with an emphasis on specific nutrition and exercise instructions and strategies.   Repetitive spaced learning was employed today to elicit superior memory formation and behavioral change.  4. Obesity: BMI 30 Charrisse is currently in the action stage of change. As such, her goal is to continue with weight loss efforts. She has agreed to keeping a food journal and adhering to recommended goals of 1200-1300 calories and 80-90 grams protein.   Exercise goals: As is  Behavioral modification strategies: increasing lean protein intake and decreasing simple carbohydrates.  Jennifer Frazier has agreed to follow-up with our clinic in 3 weeks.   Objective:   Blood pressure 119/80, pulse 69, temperature 98.2 F (36.8 C), height 5\' 9"  (1.753 m), weight 208 lb (94.3 kg), last menstrual period 09/03/2017, SpO2 100 %. Body mass index is 30.72 kg/m.  General: Cooperative, alert, well developed, in no acute distress. HEENT: Conjunctivae and lids unremarkable. Cardiovascular: Regular rhythm.  Lungs: Normal work of breathing. Neurologic: No focal deficits.   Lab Results  Component Value Date   CREATININE 1.00 01/30/2020   BUN 16 01/30/2020   NA 145 (H) 01/30/2020   K 3.8 01/30/2020   CL 105 01/30/2020   CO2 25 01/30/2020   Lab Results  Component Value Date   ALT 15 01/30/2020   AST 22 01/30/2020   ALKPHOS 91 01/30/2020   BILITOT  0.5 01/30/2020   Lab Results  Component Value Date   HGBA1C 5.0 01/30/2020   HGBA1C 4.9 04/18/2019   HGBA1C 5.0 12/08/2018   HGBA1C 5.1 12/16/2017   HGBA1C 5.3 06/24/2017   Lab Results  Component Value Date   INSULIN 4.5 01/30/2020   INSULIN 6.7 04/18/2019   INSULIN 11.0 12/08/2018   INSULIN 13.5 12/16/2017   INSULIN 6.2 06/24/2017   Lab  Results  Component Value Date   TSH 1.600 04/18/2019   Lab Results  Component Value Date   CHOL 142 04/18/2019   HDL 82 04/18/2019   LDLCALC 47 04/18/2019   TRIG 62 04/18/2019   Lab Results  Component Value Date   WBC 6.0 11/13/2019   HGB 13.2 11/13/2019   HCT 39.4 11/13/2019   MCV 85.1 11/13/2019   PLT 179 11/13/2019    Attestation Statements:   Reviewed by clinician on day of visit: allergies, medications, problem list, medical history, surgical history, family history, social history, and previous encounter notes.  Coral Ceo, am acting as Location manager for [employer], [place].  I have reviewed the above documentation for accuracy and completeness, and I agree with the above. - Georgianne Fick, FNP

## 2020-05-15 ENCOUNTER — Encounter (INDEPENDENT_AMBULATORY_CARE_PROVIDER_SITE_OTHER): Payer: Self-pay | Admitting: Family Medicine

## 2020-06-10 ENCOUNTER — Ambulatory Visit (INDEPENDENT_AMBULATORY_CARE_PROVIDER_SITE_OTHER): Payer: 59 | Admitting: Family Medicine

## 2020-06-10 ENCOUNTER — Encounter (INDEPENDENT_AMBULATORY_CARE_PROVIDER_SITE_OTHER): Payer: Self-pay | Admitting: Family Medicine

## 2020-06-10 ENCOUNTER — Other Ambulatory Visit: Payer: Self-pay

## 2020-06-10 VITALS — BP 125/83 | HR 70 | Temp 97.9°F | Ht 69.0 in | Wt 209.0 lb

## 2020-06-10 DIAGNOSIS — E88819 Insulin resistance, unspecified: Secondary | ICD-10-CM

## 2020-06-10 DIAGNOSIS — E8881 Metabolic syndrome: Secondary | ICD-10-CM | POA: Diagnosis not present

## 2020-06-10 DIAGNOSIS — Z6841 Body Mass Index (BMI) 40.0 and over, adult: Secondary | ICD-10-CM | POA: Diagnosis not present

## 2020-06-10 DIAGNOSIS — I10 Essential (primary) hypertension: Secondary | ICD-10-CM | POA: Diagnosis not present

## 2020-06-10 MED ORDER — AMLODIPINE BESYLATE 5 MG PO TABS: 5.0000 mg | ORAL_TABLET | Freq: Every day | ORAL | 0 refills | Status: DC

## 2020-06-10 NOTE — Progress Notes (Signed)
Chief Complaint:   OBESITY Jennifer Frazier is here to discuss her progress with her obesity treatment plan along with follow-up of her obesity related diagnoses. Jennifer Frazier is on keeping a food journal and adhering to recommended goals of 1200-1300 calories and 80-90 grams of protein daily and states she is following her eating plan approximately 90% of the time. Jennifer Frazier states she is walking for 30-40 minutes 5 times per week.  Today's visit was #: 4 Starting weight: 294 lbs Starting date: 06/24/2017 Today's weight: 209 lbs Today's date: 06/10/2020 Total lbs lost to date: 50 Total lbs lost since last in-office visit: 0  Interim History: Jennifer Frazier has been very consistent with walking on the treadmill at high intensity. She has been sporadic with resistance training. Her protein has been inadequate at times (only 70 grams daily), unless she has a [employer] bar. Her calories are at goal (1300 daily). Her hunger is mostly controlled.  Subjective:   1. Essential hypertension Wynetta's blood pressure is well controlled with amlodipine 5 mg.   BP Readings from Last 3 Encounters:  06/10/20 125/83  05/14/20 119/80  04/22/20 111/72   Lab Results  Component Value Date   CREATININE 1.00 01/30/2020   CREATININE 0.96 11/13/2019   CREATININE 0.97 04/18/2019   2. Insulin resistance Jennifer Frazier's hunger is fairly well controlled, and she is not on metformin.   Lab Results  Component Value Date   INSULIN 4.5 01/30/2020   INSULIN 6.7 04/18/2019   INSULIN 11.0 12/08/2018   INSULIN 13.5 12/16/2017   INSULIN 6.2 06/24/2017   Lab Results  Component Value Date   HGBA1C 5.0 01/30/2020   Assessment/Plan:   1. Essential hypertension  We will refill amlodipine for 1 month.  - amLODipine (NORVASC) 5 MG tablet; Take 1 tablet (5 mg total) by mouth daily.  Dispense: 30 tablet; Refill: 0  2. Insulin resistance Jennifer Frazier will continue her meal plan, and will continue to work on weight loss, exercise, and decreasing simple  carbohydrates to help decrease the risk of diabetes.  3. Obesity: BMI 30.85 Jennifer Frazier is currently in the action stage of change. As such, her goal is to continue with weight loss efforts. She has agreed to keeping a food journal and adhering to recommended goals of 1000-1200 calories and 75-80 grams of protein daily.   Jennifer Frazier is to keep her calories <1200.  Exercise goals: As is, and increase resistance  Behavioral modification strategies: increasing lean protein intake.  Jennifer Frazier has agreed to follow-up with our clinic in 3 weeks.   Objective:   Blood pressure 125/83, pulse 70, temperature 97.9 F (36.6 C), height 5\' 9"  (1.753 m), weight 209 lb (94.8 kg), last menstrual period 09/03/2017, SpO2 98 %. Body mass index is 30.86 kg/m.  General: Cooperative, alert, well developed, in no acute distress. HEENT: Conjunctivae and lids unremarkable. Cardiovascular: Regular rhythm.  Lungs: Normal work of breathing. Neurologic: No focal deficits.   Lab Results  Component Value Date   CREATININE 1.00 01/30/2020   BUN 16 01/30/2020   NA 145 (H) 01/30/2020   K 3.8 01/30/2020   CL 105 01/30/2020   CO2 25 01/30/2020   Lab Results  Component Value Date   ALT 15 01/30/2020   AST 22 01/30/2020   ALKPHOS 91 01/30/2020   BILITOT 0.5 01/30/2020   Lab Results  Component Value Date   HGBA1C 5.0 01/30/2020   HGBA1C 4.9 04/18/2019   HGBA1C 5.0 12/08/2018   HGBA1C 5.1 12/16/2017   HGBA1C 5.3 06/24/2017  Lab Results  Component Value Date   INSULIN 4.5 01/30/2020   INSULIN 6.7 04/18/2019   INSULIN 11.0 12/08/2018   INSULIN 13.5 12/16/2017   INSULIN 6.2 06/24/2017   Lab Results  Component Value Date   TSH 1.600 04/18/2019   Lab Results  Component Value Date   CHOL 142 04/18/2019   HDL 82 04/18/2019   LDLCALC 47 04/18/2019   TRIG 62 04/18/2019   Lab Results  Component Value Date   WBC 6.0 11/13/2019   HGB 13.2 11/13/2019   HCT 39.4 11/13/2019   MCV 85.1 11/13/2019   PLT 179  11/13/2019   No results found for: IRON, TIBC, FERRITIN  Attestation Statements:   Reviewed by clinician on day of visit: allergies, medications, problem list, medical history, surgical history, family history, social history, and previous encounter notes.   Wilhemena Durie, am acting as Location manager for [employer], FNP-C.  I have reviewed the above documentation for accuracy and completeness, and I agree with the above. -  Georgianne Fick, FNP

## 2020-07-01 ENCOUNTER — Other Ambulatory Visit: Payer: Self-pay

## 2020-07-01 ENCOUNTER — Encounter (INDEPENDENT_AMBULATORY_CARE_PROVIDER_SITE_OTHER): Payer: Self-pay | Admitting: Family Medicine

## 2020-07-01 ENCOUNTER — Ambulatory Visit (INDEPENDENT_AMBULATORY_CARE_PROVIDER_SITE_OTHER): Payer: 59 | Admitting: Family Medicine

## 2020-07-01 VITALS — BP 129/82 | HR 63 | Temp 98.0°F | Ht 69.0 in | Wt 208.0 lb

## 2020-07-01 DIAGNOSIS — I10 Essential (primary) hypertension: Secondary | ICD-10-CM

## 2020-07-01 DIAGNOSIS — Z6841 Body Mass Index (BMI) 40.0 and over, adult: Secondary | ICD-10-CM

## 2020-07-01 DIAGNOSIS — E88819 Insulin resistance, unspecified: Secondary | ICD-10-CM

## 2020-07-01 DIAGNOSIS — E8881 Metabolic syndrome: Secondary | ICD-10-CM

## 2020-07-01 MED ORDER — AMLODIPINE BESYLATE 5 MG PO TABS
5.0000 mg | ORAL_TABLET | Freq: Every day | ORAL | 0 refills | Status: DC
Start: 2020-07-01 — End: 2020-07-24

## 2020-07-04 NOTE — Progress Notes (Signed)
.    Chief Complaint:   OBESITY Jennifer Frazier is here to discuss her progress with her obesity treatment plan along with follow-up of her obesity related diagnoses. Jennifer Frazier is on keeping a food journal and adhering to recommended goals of 1000-1200 calories and 75-80 grams of protein daily and states she is following her eating plan approximately 90% of the time. Jennifer Frazier states she is walking for 30 minutes 5 times per week.  Today's visit was #: 70 Starting weight: 294 lbs Starting date: 06/24/2017 Today's weight: 208 lbs Today's date: 07/01/2020 Total lbs lost to date: 68 Total lbs lost since last in-office visit: 1  Interim History: Jennifer Frazier has tried hard to adhere to 1000 calories per day and she was able to do this a few days. It is very hard for her to eat this few calories. She struggles to maintain motivation to do resistance training. She is considering buying a [employer]. She is averaging 70-90 grams of protein per day.  Subjective:   1. Essential hypertension Jennifer Frazier's blood pressure is well controlled with amlodipine 5 mg. Cardiovascular ROS: no chest pain or dyspnea on exertion.  BP Readings from Last 3 Encounters:  07/01/20 129/82  06/10/20 125/83  05/14/20 119/80   Lab Results  Component Value Date   CREATININE 1.00 01/30/2020   CREATININE 0.96 11/13/2019   CREATININE 0.97 04/18/2019   2. Insulin resistance Jennifer Frazier struggles with hunger at times. She did not feel metformin helped with this.  Lab Results  Component Value Date   INSULIN 4.5 01/30/2020   INSULIN 6.7 04/18/2019   INSULIN 11.0 12/08/2018   INSULIN 13.5 12/16/2017   INSULIN 6.2 06/24/2017   Lab Results  Component Value Date   HGBA1C 5.0 01/30/2020   Assessment/Plan:   1. Essential hypertension  We will refill amlodipine for 1 month.  - amLODipine (NORVASC) 5 MG tablet; Take 1 tablet (5 mg total) by mouth daily.  Dispense: 30 tablet; Refill: 0  2. Insulin resistance Jennifer Frazier will continue her meal plan and  exercise.  3. Obesity: BMI 30.7 Jennifer Frazier is currently in the action stage of change. As such, her goal is to continue with weight loss efforts. She has agreed to keeping a food journal and adhering to recommended goals of 1000-1200 calories and 75-80 grams of protein daily.   Exercise goals: As is, encouraged resistance training.  Behavioral modification strategies: planning for success.  Jennifer Frazier has agreed to follow-up with our clinic in 3 to 4 weeks.  Objective:   Blood pressure 129/82, pulse 63, temperature 98 F (36.7 C), height 5\' 9"  (1.753 m), weight 208 lb (94.3 kg), last menstrual period 09/03/2017, SpO2 98 %. Body mass index is 30.72 kg/m.  General: Cooperative, alert, well developed, in no acute distress. HEENT: Conjunctivae and lids unremarkable. Cardiovascular: Regular rhythm.  Lungs: Normal work of breathing. Neurologic: No focal deficits.   Lab Results  Component Value Date   CREATININE 1.00 01/30/2020   BUN 16 01/30/2020   NA 145 (H) 01/30/2020   K 3.8 01/30/2020   CL 105 01/30/2020   CO2 25 01/30/2020   Lab Results  Component Value Date   ALT 15 01/30/2020   AST 22 01/30/2020   ALKPHOS 91 01/30/2020   BILITOT 0.5 01/30/2020   Lab Results  Component Value Date   HGBA1C 5.0 01/30/2020   HGBA1C 4.9 04/18/2019   HGBA1C 5.0 12/08/2018   HGBA1C 5.1 12/16/2017   HGBA1C 5.3 06/24/2017   Lab Results  Component Value Date  INSULIN 4.5 01/30/2020   INSULIN 6.7 04/18/2019   INSULIN 11.0 12/08/2018   INSULIN 13.5 12/16/2017   INSULIN 6.2 06/24/2017   Lab Results  Component Value Date   TSH 1.600 04/18/2019   Lab Results  Component Value Date   CHOL 142 04/18/2019   HDL 82 04/18/2019   LDLCALC 47 04/18/2019   TRIG 62 04/18/2019   Lab Results  Component Value Date   WBC 6.0 11/13/2019   HGB 13.2 11/13/2019   HCT 39.4 11/13/2019   MCV 85.1 11/13/2019   PLT 179 11/13/2019   No results found for: IRON, TIBC, FERRITIN  Attestation Statements:    Reviewed by clinician on day of visit: allergies, medications, problem list, medical history, surgical history, family history, social history, and previous encounter notes.   Wilhemena Durie, am acting as Location manager for [employer], FNP-C.  I have reviewed the above documentation for accuracy and completeness, and I agree with the above. -  Georgianne Fick, FNP

## 2020-07-24 ENCOUNTER — Encounter (INDEPENDENT_AMBULATORY_CARE_PROVIDER_SITE_OTHER): Payer: Self-pay | Admitting: Family Medicine

## 2020-07-24 ENCOUNTER — Other Ambulatory Visit: Payer: Self-pay

## 2020-07-24 ENCOUNTER — Encounter (INDEPENDENT_AMBULATORY_CARE_PROVIDER_SITE_OTHER): Payer: Self-pay

## 2020-07-24 ENCOUNTER — Ambulatory Visit (INDEPENDENT_AMBULATORY_CARE_PROVIDER_SITE_OTHER): Payer: 59 | Admitting: Family Medicine

## 2020-07-24 VITALS — BP 121/77 | HR 62 | Temp 98.1°F | Ht 69.0 in | Wt 211.0 lb

## 2020-07-24 DIAGNOSIS — E8881 Metabolic syndrome: Secondary | ICD-10-CM

## 2020-07-24 DIAGNOSIS — I10 Essential (primary) hypertension: Secondary | ICD-10-CM | POA: Diagnosis not present

## 2020-07-24 DIAGNOSIS — Z6841 Body Mass Index (BMI) 40.0 and over, adult: Secondary | ICD-10-CM

## 2020-07-24 MED ORDER — AMLODIPINE BESYLATE 5 MG PO TABS
5.0000 mg | ORAL_TABLET | Freq: Every day | ORAL | 0 refills | Status: DC
Start: 2020-07-24 — End: 2020-08-21

## 2020-07-31 ENCOUNTER — Encounter (INDEPENDENT_AMBULATORY_CARE_PROVIDER_SITE_OTHER): Payer: Self-pay | Admitting: Family Medicine

## 2020-07-31 NOTE — Progress Notes (Signed)
Chief Complaint:   OBESITY Jennifer Frazier is here to discuss her progress with her obesity treatment plan along with follow-up of her obesity related diagnoses. Jennifer Frazier is on keeping a food journal and adhering to recommended goals of 1000-1200 calories and 75-80 grams of protein daily and states she is following her eating plan approximately 40-50% of the time. Jennifer Frazier states she is walking for 30 minutes 3 times per week.  Today's visit was #: 28 Starting weight: 294 lbs Starting date: 06/24/2017 Today's weight: 211 lbs Today's date: 07/24/2020 Total lbs lost to date: 37 Total lbs lost since last in-office visit: 0  Interim History: Jennifer Frazier took time off and had "staycation". She went to the movies and had nachos, and she treated herself to some foods that were off the plan. She is up 3 lbs but she is also up 2 lbs in water. She did a live stream yoga class through work and enjoyed it.  Subjective:   1. Essential hypertension Ellen's blood pressure is well controlled. She is on amlodipine 5 mg.  BP Readings from Last 3 Encounters:  07/24/20 121/77  07/01/20 129/82  06/10/20 125/83   Lab Results  Component Value Date   CREATININE 1.00 01/30/2020   CREATININE 0.96 11/13/2019   CREATININE 0.97 04/18/2019   2. Insulin resistance Jennifer Frazier is not on metformin. Last fasting insulin level was within normal limits at 4.5, but it was 6.2 at her first office visit.  Lab Results  Component Value Date   INSULIN 4.5 01/30/2020   INSULIN 6.7 04/18/2019   INSULIN 11.0 12/08/2018   INSULIN 13.5 12/16/2017   INSULIN 6.2 06/24/2017   Lab Results  Component Value Date   HGBA1C 5.0 01/30/2020   Assessment/Plan:   1. Essential hypertension Refill amlodipine for 1 month.  - amLODipine (NORVASC) 5 MG tablet; Take 1 tablet (5 mg total) by mouth daily.  Dispense: 30 tablet; Refill: 0  2. Insulin resistance Jennifer Frazier will continue her meal plan, and will continue to work on weight loss, exercise, and  decreasing simple carbohydrates to help decrease the risk of diabetes.   3. Obesity: BMI 31.15 Jennifer Frazier is currently in the action stage of change. As such, her goal is to continue with weight loss efforts. She has agreed to keeping a food journal and adhering to recommended goals of 1100-1200 calories and 75-80 grams of protein daily.   Exercise goals: As is.  Behavioral modification strategies: increasing lean protein intake and meal planning and cooking strategies.  Jennifer Frazier has agreed to follow-up with our clinic in 3 weeks.  Objective:   Blood pressure 121/77, pulse 62, temperature 98.1 F (36.7 C), height 5\' 9"  (1.753 m), weight 211 lb (95.7 kg), last menstrual period 09/03/2017, SpO2 99 %. Body mass index is 31.16 kg/m.  General: Cooperative, alert, well developed, in no acute distress. HEENT: Conjunctivae and lids unremarkable. Cardiovascular: Regular rhythm.  Lungs: Normal work of breathing. Neurologic: No focal deficits.   Lab Results  Component Value Date   CREATININE 1.00 01/30/2020   BUN 16 01/30/2020   NA 145 (H) 01/30/2020   K 3.8 01/30/2020   CL 105 01/30/2020   CO2 25 01/30/2020   Lab Results  Component Value Date   ALT 15 01/30/2020   AST 22 01/30/2020   ALKPHOS 91 01/30/2020   BILITOT 0.5 01/30/2020   Lab Results  Component Value Date   HGBA1C 5.0 01/30/2020   HGBA1C 4.9 04/18/2019   HGBA1C 5.0 12/08/2018   HGBA1C  5.1 12/16/2017   HGBA1C 5.3 06/24/2017   Lab Results  Component Value Date   INSULIN 4.5 01/30/2020   INSULIN 6.7 04/18/2019   INSULIN 11.0 12/08/2018   INSULIN 13.5 12/16/2017   INSULIN 6.2 06/24/2017   Lab Results  Component Value Date   TSH 1.600 04/18/2019   Lab Results  Component Value Date   CHOL 142 04/18/2019   HDL 82 04/18/2019   LDLCALC 47 04/18/2019   TRIG 62 04/18/2019   Lab Results  Component Value Date   VD25OH 59.9 04/18/2019   VD25OH 66.6 12/08/2018   VD25OH 59.8 12/16/2017   Lab Results  Component Value  Date   WBC 6.0 11/13/2019   HGB 13.2 11/13/2019   HCT 39.4 11/13/2019   MCV 85.1 11/13/2019   PLT 179 11/13/2019   No results found for: IRON, TIBC, FERRITIN  Attestation Statements:   Reviewed by clinician on day of visit: allergies, medications, problem list, medical history, surgical history, family history, social history, and previous encounter notes.   Wilhemena Durie, am acting as Location manager for [employer], FNP-C.  I have reviewed the above documentation for accuracy and completeness, and I agree with the above. -  Georgianne Fick, FNP

## 2020-08-21 ENCOUNTER — Ambulatory Visit (INDEPENDENT_AMBULATORY_CARE_PROVIDER_SITE_OTHER): Payer: 59 | Admitting: Family Medicine

## 2020-08-21 ENCOUNTER — Other Ambulatory Visit: Payer: Self-pay

## 2020-08-21 ENCOUNTER — Encounter (INDEPENDENT_AMBULATORY_CARE_PROVIDER_SITE_OTHER): Payer: Self-pay | Admitting: Family Medicine

## 2020-08-21 VITALS — BP 116/77 | HR 74 | Temp 98.6°F | Ht 69.0 in | Wt 211.0 lb

## 2020-08-21 DIAGNOSIS — Z6841 Body Mass Index (BMI) 40.0 and over, adult: Secondary | ICD-10-CM | POA: Diagnosis not present

## 2020-08-21 DIAGNOSIS — M25512 Pain in left shoulder: Secondary | ICD-10-CM

## 2020-08-21 DIAGNOSIS — I10 Essential (primary) hypertension: Secondary | ICD-10-CM | POA: Diagnosis not present

## 2020-08-21 MED ORDER — AMLODIPINE BESYLATE 5 MG PO TABS: 5.0000 mg | ORAL_TABLET | Freq: Every day | ORAL | 0 refills | Status: DC

## 2020-08-26 ENCOUNTER — Other Ambulatory Visit: Payer: Self-pay | Admitting: Family Medicine

## 2020-08-26 DIAGNOSIS — Z1231 Encounter for screening mammogram for malignant neoplasm of breast: Secondary | ICD-10-CM

## 2020-08-27 NOTE — Progress Notes (Signed)
Chief Complaint:   OBESITY Brodi is here to discuss her progress with her obesity treatment plan along with follow-up of her obesity related diagnoses. Gabrella is on keeping a food journal and adhering to recommended goals of 1100-1200 calories and 75-80 grams of protein daily and states she is following her eating plan approximately 95% of the time. Cherilynn states she is walking for 30 minutes 5 times per week.  Today's visit was #: 29 Starting weight: 294 lbs Starting date: 06/24/2017 Today's weight: 211 lbs Today's date: 08/21/2020 Total lbs lost to date: 83 Total lbs lost since last in-office visit: 0  Interim History: Valissa gained 3 lbs at her last office visit and maintained today. She has increased her walking time by 10 minutes. Her calories are averaging 1200 per day. Her protein is averaging 75-90 grams per day on weekdays but less on the weekends.  Subjective:   1. Essential hypertension Calleigh's blood pressure is well controlled on amlodipine 5 mg.  BP Readings from Last 3 Encounters:  08/21/20 116/77  07/24/20 121/77  07/01/20 129/82   Lab Results  Component Value Date   CREATININE 1.00 01/30/2020   CREATININE 0.96 11/13/2019   CREATININE 0.97 04/18/2019   2. Acute pain of left shoulder Asteria has had pain in her left shoulder for a few weeks. She has reduced range of motion, reports no injury. She has also had some numbness and tingling in her left arm that has nowsubsided.  Assessment/Plan:   1. Essential hypertension We will refill amlodipine for 1 month.  - amLODipine (NORVASC) 5 MG tablet; Take 1 tablet (5 mg total) by mouth daily.  Dispense: 30 tablet; Refill: 0  2. Acute pain of left shoulder Carlie will FU with primary care physician if her shoulder pain continues.  3. Obesity: BMI 31.15 Elia is currently in the action stage of change. As such, her goal is to continue with weight loss efforts. She has agreed to keeping a food journal and adhering to  recommended goals of 1100-1200 calories and 75-80 grams of protein daily.   Exercise goals: As is, add resistance training 2 times per week.  Behavioral modification strategies: meal planning and cooking strategies.  Sinthia has agreed to follow-up with our clinic in 3 to 4 weeks. Objective:   Blood pressure 116/77, pulse 74, temperature 98.6 F (37 C), height '5\' 9"'$  (1.753 m), weight 211 lb (95.7 kg), last menstrual period 09/03/2017, SpO2 99 %. Body mass index is 31.16 kg/m.  General: Cooperative, alert, well developed, in no acute distress. HEENT: Conjunctivae and lids unremarkable. Cardiovascular: Regular rhythm.  Lungs: Normal work of breathing. Neurologic: No focal deficits.   Lab Results  Component Value Date   CREATININE 1.00 01/30/2020   BUN 16 01/30/2020   NA 145 (H) 01/30/2020   K 3.8 01/30/2020   CL 105 01/30/2020   CO2 25 01/30/2020   Lab Results  Component Value Date   ALT 15 01/30/2020   AST 22 01/30/2020   ALKPHOS 91 01/30/2020   BILITOT 0.5 01/30/2020   Lab Results  Component Value Date   HGBA1C 5.0 01/30/2020   HGBA1C 4.9 04/18/2019   HGBA1C 5.0 12/08/2018   HGBA1C 5.1 12/16/2017   HGBA1C 5.3 06/24/2017   Lab Results  Component Value Date   INSULIN 4.5 01/30/2020   INSULIN 6.7 04/18/2019   INSULIN 11.0 12/08/2018   INSULIN 13.5 12/16/2017   INSULIN 6.2 06/24/2017   Lab Results  Component Value Date  TSH 1.600 04/18/2019   Lab Results  Component Value Date   CHOL 142 04/18/2019   HDL 82 04/18/2019   LDLCALC 47 04/18/2019   TRIG 62 04/18/2019   Lab Results  Component Value Date   VD25OH 59.9 04/18/2019   VD25OH 66.6 12/08/2018   VD25OH 59.8 12/16/2017   Lab Results  Component Value Date   WBC 6.0 11/13/2019   HGB 13.2 11/13/2019   HCT 39.4 11/13/2019   MCV 85.1 11/13/2019   PLT 179 11/13/2019   No results found for: IRON, TIBC, FERRITIN  Attestation Statements:   Reviewed by clinician on day of visit: allergies,  medications, problem list, medical history, surgical history, family history, social history, and previous encounter notes.   Wilhemena Durie, am acting as Location manager for [employer], FNP-C.  I have reviewed the above documentation for accuracy and completeness, and I agree with the above. -  Georgianne Fick, FNP

## 2020-09-04 LAB — CBC: RBC: 5.12 — AB (ref 3.87–5.11)

## 2020-09-04 LAB — LIPID PANEL
Cholesterol: 150 (ref 0–200)
HDL: 84 — AB (ref 35–70)
LDL Cholesterol: 56
Triglycerides: 40 (ref 40–160)

## 2020-09-04 LAB — COMPREHENSIVE METABOLIC PANEL: Calcium: 10.3 (ref 8.7–10.7)

## 2020-09-04 LAB — CBC AND DIFFERENTIAL
HCT: 43 (ref 36–46)
Hemoglobin: 14.3 (ref 12.0–16.0)
Neutrophils Absolute: 2
Platelets: 214 (ref 150–399)
WBC: 4

## 2020-09-04 LAB — HEPATIC FUNCTION PANEL
ALT: 13 (ref 7–35)
AST: 20 (ref 13–35)
Alkaline Phosphatase: 97 (ref 25–125)
Bilirubin, Total: 0.3

## 2020-09-04 LAB — BASIC METABOLIC PANEL
BUN: 19 (ref 4–21)
Chloride: 105 (ref 99–108)
Creatinine: 0.8 (ref 0.5–1.1)
Glucose: 79
Potassium: 4 (ref 3.4–5.3)
Sodium: 145 (ref 137–147)

## 2020-09-04 LAB — TSH: TSH: 1.65 (ref 0.41–5.90)

## 2020-09-18 ENCOUNTER — Ambulatory Visit (INDEPENDENT_AMBULATORY_CARE_PROVIDER_SITE_OTHER): Payer: 59 | Admitting: Family Medicine

## 2020-09-18 ENCOUNTER — Encounter (INDEPENDENT_AMBULATORY_CARE_PROVIDER_SITE_OTHER): Payer: Self-pay | Admitting: Family Medicine

## 2020-09-18 ENCOUNTER — Other Ambulatory Visit: Payer: Self-pay

## 2020-09-18 VITALS — BP 130/83 | HR 73 | Temp 98.1°F | Ht 69.0 in | Wt 208.0 lb

## 2020-09-18 DIAGNOSIS — E8881 Metabolic syndrome: Secondary | ICD-10-CM | POA: Diagnosis not present

## 2020-09-18 DIAGNOSIS — I10 Essential (primary) hypertension: Secondary | ICD-10-CM | POA: Diagnosis not present

## 2020-09-18 DIAGNOSIS — Z6841 Body Mass Index (BMI) 40.0 and over, adult: Secondary | ICD-10-CM

## 2020-09-18 DIAGNOSIS — E88819 Insulin resistance, unspecified: Secondary | ICD-10-CM

## 2020-09-18 MED ORDER — AMLODIPINE BESYLATE 5 MG PO TABS: 5.0000 mg | ORAL_TABLET | Freq: Every day | ORAL | 0 refills | Status: DC

## 2020-09-18 NOTE — Progress Notes (Signed)
Chief Complaint:   OBESITY Jennifer Frazier is here to discuss her progress with her obesity treatment plan along with follow-up of her obesity related diagnoses. Jennifer Frazier is on keeping a food journal and adhering to recommended goals of 1100-1200 calories and 75-80 grams of protein and states she is following her eating plan approximately 95% of the time. Jennifer Frazier states she is walking and doing resistance training (inconsistently) 35 minutes 5 times per week.  Today's visit was #: 48 Starting weight: 294 lbs Starting date: 06/24/2017 Today's weight: 208 lbs Today's date: 09/18/2020 Total lbs lost to date: 86 lbs Total lbs lost since last in-office visit: 3 lbs  Interim History: Jennifer Frazier is journaling consistently and keeping calories close to 1200. She is exceeding her protein goals (90-100 grams per day). She has increased her walking by 1 mile per day. She is limiting carbs 2-3 days per week.  Subjective:   1. Insulin resistance Jennifer Frazier is currently not on medications.  Lab Results  Component Value Date   INSULIN 4.5 01/30/2020   INSULIN 6.7 04/18/2019   INSULIN 11.0 12/08/2018   INSULIN 13.5 12/16/2017   INSULIN 6.2 06/24/2017   Lab Results  Component Value Date   HGBA1C 5.0 01/30/2020    2. Essential hypertension Jennifer Frazier's hypertension is well controlled. She is currently on Norvasc.  BP Readings from Last 3 Encounters:  09/18/20 130/83  08/21/20 116/77  07/24/20 121/77     Assessment/Plan:   1. Insulin resistance Jennifer Frazier will continue meal plan and she will work on weight loss, exercise, and decreasing simple carbohydrates to help decrease the risk of diabetes.   2. Essential hypertension We will refill Norvasc 5 mg every day for 1 month with no refills. We will watch for signs of hypotension as she continues her lifestyle modifications.  - amLODipine (NORVASC) 5 MG tablet; Take 1 tablet (5 mg total) by mouth daily.  Dispense: 30 tablet; Refill: 0  3. Obesity: BMI 30.7 Jennifer Frazier  is currently in the action stage of change. As such, her goal is to continue with weight loss efforts. She has agreed to keeping a food journal and adhering to recommended goals of 1100-1200 calories and 75-80 grams of protein daily.  Exercise goals:  As is.  Behavioral modification strategies: decreasing simple carbohydrates and keeping a strict food journal.  Jennifer Frazier has agreed to follow-up with our clinic in 3-4 weeks.  Jennifer Frazier was informed we would discuss her lab results at her next visit unless there is a critical issue that needs to be addressed sooner. Jennifer Frazier agreed to keep her next visit at the agreed upon time to discuss these results.  Objective:   Blood pressure 130/83, pulse 73, temperature 98.1 F (36.7 C), height '5\' 9"'$  (1.753 m), weight 208 lb (94.3 kg), last menstrual period 09/03/2017, SpO2 98 %. Body mass index is 30.72 kg/m.  General: Cooperative, alert, well developed, in no acute distress. HEENT: Conjunctivae and lids unremarkable. Cardiovascular: Regular rhythm.  Lungs: Normal work of breathing. Neurologic: No focal deficits.   Lab Results  Component Value Date   CREATININE 1.00 01/30/2020   BUN 16 01/30/2020   NA 145 (H) 01/30/2020   K 3.8 01/30/2020   CL 105 01/30/2020   CO2 25 01/30/2020   Lab Results  Component Value Date   ALT 15 01/30/2020   AST 22 01/30/2020   ALKPHOS 91 01/30/2020   BILITOT 0.5 01/30/2020   Lab Results  Component Value Date   HGBA1C 5.0 01/30/2020  HGBA1C 4.9 04/18/2019   HGBA1C 5.0 12/08/2018   HGBA1C 5.1 12/16/2017   HGBA1C 5.3 06/24/2017   Lab Results  Component Value Date   INSULIN 4.5 01/30/2020   INSULIN 6.7 04/18/2019   INSULIN 11.0 12/08/2018   INSULIN 13.5 12/16/2017   INSULIN 6.2 06/24/2017   Lab Results  Component Value Date   TSH 1.600 04/18/2019   Lab Results  Component Value Date   CHOL 142 04/18/2019   HDL 82 04/18/2019   LDLCALC 47 04/18/2019   TRIG 62 04/18/2019   Lab Results  Component Value  Date   VD25OH 59.9 04/18/2019   VD25OH 66.6 12/08/2018   VD25OH 59.8 12/16/2017   Lab Results  Component Value Date   WBC 6.0 11/13/2019   HGB 13.2 11/13/2019   HCT 39.4 11/13/2019   MCV 85.1 11/13/2019   PLT 179 11/13/2019   No results found for: IRON, TIBC, FERRITIN  Attestation Statements:   Reviewed by clinician on day of visit: allergies, medications, problem list, medical history, surgical history, family history, social history, and previous encounter notes.  I, Jennifer Frazier, RMA, am acting as Location manager for [employer], [place].   I have reviewed the above documentation for accuracy and completeness, and I agree with the above. -  Jennifer Fick, FNP

## 2020-10-15 ENCOUNTER — Other Ambulatory Visit: Payer: Self-pay

## 2020-10-15 ENCOUNTER — Ambulatory Visit (INDEPENDENT_AMBULATORY_CARE_PROVIDER_SITE_OTHER): Payer: 59 | Admitting: Physician Assistant

## 2020-10-15 VITALS — BP 119/79 | HR 66 | Temp 97.9°F | Ht 69.0 in | Wt 207.0 lb

## 2020-10-15 DIAGNOSIS — E669 Obesity, unspecified: Secondary | ICD-10-CM | POA: Diagnosis not present

## 2020-10-15 DIAGNOSIS — I10 Essential (primary) hypertension: Secondary | ICD-10-CM

## 2020-10-15 DIAGNOSIS — Z9189 Other specified personal risk factors, not elsewhere classified: Secondary | ICD-10-CM

## 2020-10-15 DIAGNOSIS — Z6830 Body mass index (BMI) 30.0-30.9, adult: Secondary | ICD-10-CM | POA: Diagnosis not present

## 2020-10-15 DIAGNOSIS — E66811 Obesity, class 1: Secondary | ICD-10-CM

## 2020-10-15 MED ORDER — AMLODIPINE BESYLATE 5 MG PO TABS: 5.0000 mg | ORAL_TABLET | Freq: Every day | ORAL | 0 refills | Status: DC

## 2020-10-15 NOTE — Progress Notes (Signed)
Chief Complaint:   OBESITY Jennifer Frazier is here to discuss her progress with her obesity treatment plan along with follow-up of her obesity related diagnoses. Jennifer Frazier is on keeping a food journal and adhering to recommended goals of 1100-1200 calories and 75-80 grams of protein and states she is following her eating plan approximately 90% of the time. Jennifer Frazier states she is walking for 30-45 minutes 5 times per week.  Today's visit was #: 45 Starting weight: 294 lbs Starting date: 06/24/2017 Today's weight: 207 lbs Today's date: 10/15/2020 Total lbs lost to date: 87 lbs Total lbs lost since last in-office visit: 1 lb  Interim History: Jennifer Frazier reports struggling with strength training, as she feels like she needs a "liveImmunologist. She is trying to eat lower carb diet. She is journaling most days. Peloton app information given as suggestion for resistance training.  Subjective:   1. Essential hypertension Jennifer Frazier's blood pressure is controlled on Norvasc. She denies headache or chest pains.  2. At risk for heart disease Jennifer Frazier is at a higher than average risk for cardiovascular disease due to obesity.    Assessment/Plan:   1. Essential hypertension We will refill Norvasc for 1 month with no refills. [name] is working on healthy weight loss and exercise to improve blood pressure control. We will watch for signs of hypotension as she continues her lifestyle modifications.  - amLODipine (NORVASC) 5 MG tablet; Take 1 tablet (5 mg total) by mouth daily.  Dispense: 30 tablet; Refill: 0  2. At risk for heart disease Jennifer Frazier was given approximately 15 minutes of coronary artery disease prevention counseling today. She is 45 y.o. female and has risk factors for heart disease including obesity. We discussed intensive lifestyle modifications today with an emphasis on specific weight loss instructions and strategies.   Repetitive spaced learning was employed today to elicit superior memory formation and  behavioral change.   3. Obesity with current BMI 30.55 Jennifer Frazier is currently in the action stage of change. As such, her goal is to continue with weight loss efforts. She has agreed to keeping a food journal and adhering to recommended goals of 1100-1200 calories and 80 grams of protein daily.  Exercise goals:  As is.  Behavioral modification strategies: planning for success and keeping a strict food journal.  Jennifer Frazier has agreed to follow-up with our clinic in 3-4 weeks. She was informed of the importance of frequent follow-up visits to maximize her success with intensive lifestyle modifications for her multiple health conditions.   Objective:   Blood pressure 119/79, pulse 66, temperature 97.9 F (36.6 C), height 5\' 9"  (1.753 m), weight 207 lb (93.9 kg), SpO2 99 %. Body mass index is 30.57 kg/m.  General: Cooperative, alert, well developed, in no acute distress. HEENT: Conjunctivae and lids unremarkable. Cardiovascular: Regular rhythm.  Lungs: Normal work of breathing. Neurologic: No focal deficits.   Lab Results  Component Value Date   CREATININE 0.8 09/04/2020   BUN 19 09/04/2020   NA 145 09/04/2020   K 4.0 09/04/2020   CL 105 09/04/2020   CO2 25 01/30/2020   Lab Results  Component Value Date   ALT 13 09/04/2020   AST 20 09/04/2020   ALKPHOS 97 09/04/2020   BILITOT 0.5 01/30/2020   Lab Results  Component Value Date   HGBA1C 5.0 01/30/2020   HGBA1C 4.9 04/18/2019   HGBA1C 5.0 12/08/2018   HGBA1C 5.1 12/16/2017   HGBA1C 5.3 06/24/2017   Lab Results  Component Value Date  INSULIN 4.5 01/30/2020   INSULIN 6.7 04/18/2019   INSULIN 11.0 12/08/2018   INSULIN 13.5 12/16/2017   INSULIN 6.2 06/24/2017   Lab Results  Component Value Date   TSH 1.65 09/04/2020   Lab Results  Component Value Date   CHOL 150 09/04/2020   HDL 84 (A) 09/04/2020   LDLCALC 56 09/04/2020   TRIG 40 09/04/2020   Lab Results  Component Value Date   VD25OH 59.9 04/18/2019   VD25OH 66.6  12/08/2018   VD25OH 59.8 12/16/2017   Lab Results  Component Value Date   WBC 4.0 09/04/2020   HGB 14.3 09/04/2020   HCT 43 09/04/2020   MCV 85.1 11/13/2019   PLT 214 09/04/2020   No results found for: IRON, TIBC, FERRITIN  Attestation Statements:   Reviewed by clinician on day of visit: allergies, medications, problem list, medical history, surgical history, family history, social history, and previous encounter notes.  I, Tonye Pearson, am acting as Location manager for [employer], PA-C.   I have reviewed the above documentation for accuracy and completeness, and I agree with the above. Abby Potash, PA-C

## 2020-10-16 ENCOUNTER — Ambulatory Visit
Admission: RE | Admit: 2020-10-16 | Discharge: 2020-10-16 | Disposition: A | Discharge status: Home / Self Care | Payer: 59 | Source: Ambulatory Visit | Attending: Family Medicine | Admitting: Family Medicine

## 2020-10-16 DIAGNOSIS — Z1231 Encounter for screening mammogram for malignant neoplasm of breast: Secondary | ICD-10-CM

## 2020-11-12 ENCOUNTER — Other Ambulatory Visit: Payer: Self-pay

## 2020-11-12 ENCOUNTER — Encounter (INDEPENDENT_AMBULATORY_CARE_PROVIDER_SITE_OTHER): Payer: Self-pay | Admitting: Family Medicine

## 2020-11-12 ENCOUNTER — Ambulatory Visit (INDEPENDENT_AMBULATORY_CARE_PROVIDER_SITE_OTHER): Payer: 59 | Admitting: Family Medicine

## 2020-11-12 VITALS — BP 135/80 | HR 62 | Temp 98.0°F | Ht 69.0 in | Wt 207.0 lb

## 2020-11-12 DIAGNOSIS — Z6830 Body mass index (BMI) 30.0-30.9, adult: Secondary | ICD-10-CM

## 2020-11-12 DIAGNOSIS — E88819 Insulin resistance, unspecified: Secondary | ICD-10-CM

## 2020-11-12 DIAGNOSIS — E669 Obesity, unspecified: Secondary | ICD-10-CM | POA: Diagnosis not present

## 2020-11-12 DIAGNOSIS — E8881 Metabolic syndrome: Secondary | ICD-10-CM | POA: Diagnosis not present

## 2020-11-12 DIAGNOSIS — I10 Essential (primary) hypertension: Secondary | ICD-10-CM

## 2020-11-12 MED ORDER — AMLODIPINE BESYLATE 5 MG PO TABS: 5.0000 mg | ORAL_TABLET | Freq: Every day | ORAL | 0 refills | Status: DC

## 2020-11-12 NOTE — Progress Notes (Signed)
Chief Complaint:   OBESITY Jennifer Frazier is here to discuss her progress with her obesity treatment plan along with follow-up of her obesity related diagnoses. Admire is on keeping a food journal and adhering to recommended goals of 1100-1200 calories and 80 grams of protein and states she is following her eating plan approximately 70% of the time. Keydi states she is not consistent 30 minutes 5 (when able)  times per week.  Today's visit was #: 32 Starting weight: 294 lbs Starting date: 06/24/2017 Today's weight: 207 lbs Today's date: 11/12/2020 Total lbs lost to date: 87 lbs Total lbs lost since last in-office visit: 0  Interim History: Jaclyne has been struggling to be consistent with exercise. She has not been consistent with journaling and says she is not getting in enough protein. Her water intake has been low.  Her weight has been plateaued for a year around 207 lbs.  Her goal is to get below 200 lbs.  Subjective:   1. Essential hypertension Iliyah's blood pressure is controlled on Amlodipine.  BP Readings from Last 3 Encounters:  11/12/20 135/80  10/15/20 119/79  09/18/20 130/83    2. Insulin resistance Akeria's appetite is well controlled. She denies excessive hunger.  Lab Results  Component Value Date   INSULIN 4.5 01/30/2020   INSULIN 6.7 04/18/2019   INSULIN 11.0 12/08/2018   INSULIN 13.5 12/16/2017   INSULIN 6.2 06/24/2017   Lab Results  Component Value Date   HGBA1C 5.0 01/30/2020    Assessment/Plan:   1. Essential hypertension We will refill Amlodipine 5 mg daily. We will watch for signs of hypotension as she continues her lifestyle modifications.  - amLODipine (NORVASC) 5 MG tablet; Take 1 tablet (5 mg total) by mouth daily.  Dispense: 30 tablet; Refill: 0  2. Insulin resistance  Fonnie declines Mounjaro. Jaylia agreed to follow-up with [name] as directed to closely monitor her progress.  3. Obesity with current BMI 30.55 Ilhan is currently in the action  stage of change. As such, her goal is to continue with weight loss efforts. She has agreed to keeping a food journal and adhering to recommended goals of 1100-1200 calories and 80 grams of protein daily.  Avalyn will journal 5 times per week.  Exercise goals:  Shandale will work on frequency of exercise.  Behavioral modification strategies: increasing lean protein intake, increasing water intake, and keeping a strict food journal.  Brycelyn has agreed to follow-up with our clinic in 3-4 weeks.  Objective:   Blood pressure 135/80, pulse 62, temperature 98 F (36.7 C), height 5\' 9"  (1.753 m), weight 207 lb (93.9 kg), SpO2 98 %. Body mass index is 30.57 kg/m.  General: Cooperative, alert, well developed, in no acute distress. HEENT: Conjunctivae and lids unremarkable. Cardiovascular: Regular rhythm.  Lungs: Normal work of breathing. Neurologic: No focal deficits.   Lab Results  Component Value Date   CREATININE 0.8 09/04/2020   BUN 19 09/04/2020   NA 145 09/04/2020   K 4.0 09/04/2020   CL 105 09/04/2020   CO2 25 01/30/2020   Lab Results  Component Value Date   ALT 13 09/04/2020   AST 20 09/04/2020   ALKPHOS 97 09/04/2020   BILITOT 0.5 01/30/2020   Lab Results  Component Value Date   HGBA1C 5.0 01/30/2020   HGBA1C 4.9 04/18/2019   HGBA1C 5.0 12/08/2018   HGBA1C 5.1 12/16/2017   HGBA1C 5.3 06/24/2017   Lab Results  Component Value Date   INSULIN 4.5 01/30/2020  INSULIN 6.7 04/18/2019   INSULIN 11.0 12/08/2018   INSULIN 13.5 12/16/2017   INSULIN 6.2 06/24/2017   Lab Results  Component Value Date   TSH 1.65 09/04/2020   Lab Results  Component Value Date   CHOL 150 09/04/2020   HDL 84 (A) 09/04/2020   LDLCALC 56 09/04/2020   TRIG 40 09/04/2020   Lab Results  Component Value Date   VD25OH 59.9 04/18/2019   VD25OH 66.6 12/08/2018   VD25OH 59.8 12/16/2017   Lab Results  Component Value Date   WBC 4.0 09/04/2020   HGB 14.3 09/04/2020   HCT 43 09/04/2020   MCV  85.1 11/13/2019   PLT 214 09/04/2020   No results found for: IRON, TIBC, FERRITIN  Attestation Statements:   Reviewed by clinician on day of visit: allergies, medications, problem list, medical history, surgical history, family history, social history, and previous encounter notes.  I, Lizbeth Bark, RMA, am acting as Location manager for [employer], [place].   I have reviewed the above documentation for accuracy and completeness, and I agree with the above. -  Georgianne Fick, FNP

## 2020-11-26 ENCOUNTER — Other Ambulatory Visit: Payer: Self-pay

## 2020-11-26 ENCOUNTER — Ambulatory Visit (INDEPENDENT_AMBULATORY_CARE_PROVIDER_SITE_OTHER): Payer: 59 | Admitting: Podiatry

## 2020-11-26 DIAGNOSIS — L84 Corns and callosities: Secondary | ICD-10-CM

## 2020-11-26 DIAGNOSIS — R52 Pain, unspecified: Secondary | ICD-10-CM

## 2020-11-26 NOTE — Patient Instructions (Signed)
Look for urea 40% cream or ointment and apply to the thickened dry skin / calluses. This can be bought over the counter, at a pharmacy or online such as [employer].  

## 2020-11-28 NOTE — Progress Notes (Signed)
  Subjective:  Patient ID: Jennifer Frazier, female    DOB: 08/23/1975,  MRN: [id number]  Chief Complaint  Patient presents with   Nail Problem     left foot 2nd toe possible ingrown    45 y.o. female returns with the above complaint. History confirmed with patient.  Calluses have returned and are painful again.  She thinks is an ingrown nail in the left second toe  Objective:  Physical Exam: warm, good capillary refill, no trophic changes or ulcerative lesions, normal DP and PT pulses and normal sensory exam.  Bilaterally she has hallux valgus with bunions, mild hammertoes, tailor's bunions and prominent fifth metatarsal bases, suspect metatarsus adductus is present as well.  Bilateral mild pes planus.  Calluses submet 2, 5 bilaterally, she also has right foot calluses on the fifth metatarsal base and medial hallux.  Left second toe medial border ingrowing nail with callus formation   Radiographs of the right foot were taken: Moderate hallux valgus with metatarsus adductus and increased hallux abductus angle.  The second metatarsal is elongated hammertoe contractures are present  Assessment:   No diagnosis found.    Plan:  Patient was evaluated and treated and all questions answered.  All symptomatic hyperkeratoses were safely debrided with a sterile #15 blade to patient's level of comfort without incident. We discussed preventative and palliative care of these lesions including supportive and accommodative shoegear, padding, prefabricated and custom molded accommodative orthoses, use of a pumice stone and lotions/creams daily.  Recommend she use urea cream and a pumice stone daily  Fingernail left side I debride this in a slant back fashion alleviate pain and pressure.  Should take care of most of her issue here I think if this recurs would need to consider partial permanent nail avulsion Return if symptoms worsen or fail to improve.

## 2020-12-03 ENCOUNTER — Ambulatory Visit (INDEPENDENT_AMBULATORY_CARE_PROVIDER_SITE_OTHER): Payer: 59 | Admitting: Family Medicine

## 2020-12-03 ENCOUNTER — Other Ambulatory Visit: Payer: Self-pay

## 2020-12-03 ENCOUNTER — Encounter (INDEPENDENT_AMBULATORY_CARE_PROVIDER_SITE_OTHER): Payer: Self-pay | Admitting: Family Medicine

## 2020-12-03 VITALS — BP 108/71 | HR 71 | Temp 97.8°F | Ht 69.0 in | Wt 212.0 lb

## 2020-12-03 DIAGNOSIS — G4733 Obstructive sleep apnea (adult) (pediatric): Secondary | ICD-10-CM

## 2020-12-03 DIAGNOSIS — Z6841 Body Mass Index (BMI) 40.0 and over, adult: Secondary | ICD-10-CM

## 2020-12-03 DIAGNOSIS — I10 Essential (primary) hypertension: Secondary | ICD-10-CM

## 2020-12-03 MED ORDER — AMLODIPINE BESYLATE 5 MG PO TABS: 5.0000 mg | ORAL_TABLET | Freq: Every day | ORAL | 0 refills | Status: DC

## 2020-12-03 NOTE — Progress Notes (Signed)
Chief Complaint:   OBESITY Jennifer Frazier is here to discuss her progress with her obesity treatment plan along with follow-up of her obesity related diagnoses. Jennifer Frazier is on keeping a food journal and adhering to recommended goals of 1100-1200 calories and 80 grams of protein and states she is following her eating plan approximately 90% of the time. Jennifer Frazier states she is walking for 30 minutes 5 times per week.  Today's visit was #: 40 Starting weight: 294 lbs Starting date: 06/24/2017 Today's weight: 212 lbs Today's date: 12/03/2020 Total lbs lost to date: 82 lbs Total lbs lost since last in-office visit: 0  Interim History: Jennifer Frazier is very surprised with 5 lb weight gain. She has been consistent with exercise and journaling. Her calories average 1200 per day. She is getting > than 90 grams of protein per day. She does notes hunger at times. Her RMR was 1463 in Jan. 2022. She feels she accounts for all calories when she journals.  Subjective:   1. Essential hypertension Jennifer Frazier's blood pressure is well controlled. She is on Amlodipine 5 mg. She denies dizziness.   BP Readings from Last 3 Encounters:  12/03/20 108/71  11/12/20 135/80  10/15/20 119/79    2. OSA (obstructive sleep apnea) Jennifer Frazier declined using a CPAP. She does not feel she gets enough sleep. She gets about 5 hours of sleep per night. She tends to wake up early. She has had Cognitive behavioural therapy (CBT) for insomnia in the past which she enjoyed and felt was beneficial. She declines prescription medication.   Assessment/Plan:   1. Essential hypertension We will refill Amlodipine 5 mg for 1 month with no refills. Rejoice is working on healthy weight loss and exercise to improve blood pressure control. We will watch for signs of hypotension as she continues her lifestyle modifications.  - amLODipine (NORVASC) 5 MG tablet; Take 1 tablet (5 mg total) by mouth daily.  Dispense: 30 tablet; Refill: 0  2. OSA (obstructive sleep  apnea) Intensive lifestyle modifications are the first line treatment for this issue. We discussed several lifestyle modifications today and she will continue to work on diet, exercise and weight loss efforts. Jennifer Frazier will consider trying OTC doxylamine. She will consider going back to counseling for Cognitive behavioural therapy (CBT). We will continue to monitor. I advised that poor sleep can affect weight loss.   3. Obesity with current BMI 31.29 Jennifer Frazier is currently in the action stage of change. As such, her goal is to continue with weight loss efforts. She has agreed to keeping a food journal and adhering to recommended goals of 1100-1200 calories and 100 grams of protein daily.    We will check IC next office visit.  Jennifer Frazier will increase protein to 100 grams per day.   Exercise goals:  As is.  Behavioral modification strategies: increasing lean protein intake.  Jennifer Frazier has agreed to follow-up with our clinic in 3-4 weeks ( fasting).  Objective:   Blood pressure 108/71, pulse 71, temperature 97.8 F (36.6 C), height 5\' 9"  (1.753 m), weight 212 lb (96.2 kg), SpO2 98 %. Body mass index is 31.31 kg/m.  General: Cooperative, alert, well developed, in no acute distress. HEENT: Conjunctivae and lids unremarkable. Cardiovascular: Regular rhythm.  Lungs: Normal work of breathing. Neurologic: No focal deficits.   Lab Results  Component Value Date   CREATININE 0.8 09/04/2020   BUN 19 09/04/2020   NA 145 09/04/2020   K 4.0 09/04/2020   CL 105 09/04/2020   CO2  25 01/30/2020   Lab Results  Component Value Date   ALT 13 09/04/2020   AST 20 09/04/2020   ALKPHOS 97 09/04/2020   BILITOT 0.5 01/30/2020   Lab Results  Component Value Date   HGBA1C 5.0 01/30/2020   HGBA1C 4.9 04/18/2019   HGBA1C 5.0 12/08/2018   HGBA1C 5.1 12/16/2017   HGBA1C 5.3 06/24/2017   Lab Results  Component Value Date   INSULIN 4.5 01/30/2020   INSULIN 6.7 04/18/2019   INSULIN 11.0 12/08/2018   INSULIN 13.5  12/16/2017   INSULIN 6.2 06/24/2017   Lab Results  Component Value Date   TSH 1.65 09/04/2020   Lab Results  Component Value Date   CHOL 150 09/04/2020   HDL 84 (A) 09/04/2020   LDLCALC 56 09/04/2020   TRIG 40 09/04/2020   Lab Results  Component Value Date   VD25OH 59.9 04/18/2019   VD25OH 66.6 12/08/2018   VD25OH 59.8 12/16/2017   Lab Results  Component Value Date   WBC 4.0 09/04/2020   HGB 14.3 09/04/2020   HCT 43 09/04/2020   MCV 85.1 11/13/2019   PLT 214 09/04/2020   No results found for: IRON, TIBC, FERRITIN  Attestation Statements:   Reviewed by clinician on day of visit: allergies, medications, problem list, medical history, surgical history, family history, social history, and previous encounter notes.  I, Lizbeth Bark, RMA, am acting as Location manager for [employer], [place].   I have reviewed the above documentation for accuracy and completeness, and I agree with the above. -  Georgianne Fick, FNP

## 2021-01-06 ENCOUNTER — Encounter (INDEPENDENT_AMBULATORY_CARE_PROVIDER_SITE_OTHER): Payer: Self-pay | Admitting: Adult Health

## 2021-01-06 ENCOUNTER — Other Ambulatory Visit: Payer: Self-pay

## 2021-01-06 ENCOUNTER — Ambulatory Visit (INDEPENDENT_AMBULATORY_CARE_PROVIDER_SITE_OTHER): Payer: 59 | Admitting: Adult Health

## 2021-01-06 VITALS — BP 129/83 | HR 74 | Temp 98.1°F | Ht 69.0 in | Wt 212.0 lb

## 2021-01-06 DIAGNOSIS — Z6841 Body Mass Index (BMI) 40.0 and over, adult: Secondary | ICD-10-CM

## 2021-01-06 DIAGNOSIS — Z0000 Encounter for general adult medical examination without abnormal findings: Secondary | ICD-10-CM

## 2021-01-06 DIAGNOSIS — Z9189 Other specified personal risk factors, not elsewhere classified: Secondary | ICD-10-CM | POA: Diagnosis not present

## 2021-01-06 DIAGNOSIS — R0602 Shortness of breath: Secondary | ICD-10-CM

## 2021-01-06 DIAGNOSIS — I10 Essential (primary) hypertension: Secondary | ICD-10-CM | POA: Diagnosis not present

## 2021-01-06 MED ORDER — AMLODIPINE BESYLATE 5 MG PO TABS: 5.0000 mg | ORAL_TABLET | Freq: Every day | ORAL | 0 refills | Status: DC

## 2021-01-06 NOTE — Progress Notes (Signed)
Chief Complaint:   OBESITY Jennifer Frazier is here to discuss her progress with her obesity treatment plan along with follow-up of her obesity related diagnoses. Jennifer Frazier is on keeping a food journal and adhering to recommended goals of 1100-1200 calories and 100 grams of protein and states she is following her eating plan approximately 90% of the time. Jennifer Frazier states she is walking, weights for 30 minutes 5 times per week.  Today's visit was #: 64 Starting weight: 294 lbs Starting date: 06/24/2017 Today's weight: 212 lbs Today's date: 01/06/2021 Total lbs lost to date: 83 lbs Total lbs lost since last in-office visit: 0  Interim History:  Jennifer Frazier's RMR was 1463 in 01/2020.  RMR today is 2232. Increase of metabolism by 769. She reports constant hunger levels when eating on current journaling plan. Of note:  Declined GLP-1 therapy on 11/12/2020.  Subjective:   1. Shortness of breath on exertion Jennifer Frazier reports SOB with extreme exertion.  Denies CP with exertion. Jennifer Frazier's RMR was 1463 in 01/2020.  RMR today is 2232. Increase of metabolism by 769.  2. Essential hypertension BP/HR stable at office visit. She denies lower extremity edema.  3. Healthcare maintenance Her mother has T2D. She reports constant hunger levels when eating on current journaling plan. Declined GLP-1 therapy on 11/12/2020.  4. At risk for deficient intake of food The patient is at a higher than average risk of deficient intake of food due to increased RMR.   Assessment/Plan:   1. Shortness of breath on exertion Check IC today.  2. Essential hypertension Check labs today. Refill amlodipine 5 mg daily.  - Refill amLODipine (NORVASC) 5 MG tablet; Take 1 tablet (5 mg total) by mouth daily.  Dispense: 30 tablet; Refill: 0 - Comprehensive metabolic panel  3. Healthcare maintenance Check labs today.  - Hemoglobin A1c - Insulin, random - VITAMIN D 25 Hydroxy (Vit-D Deficiency, Fractures)  4. At risk for deficient  intake of food Jennifer Frazier was given approximately 15 minutes of deficit intake of food prevention counseling today. Jennifer Frazier is at risk for eating too few calories based on current food recall. She was encouraged to focus on meeting caloric and protein goals according to her recommended meal plan.    5. Obesity: BMI 31.3  Jennifer Frazier is currently in the action stage of change. As such, her goal is to continue with weight loss efforts. She has agreed to keeping a food journal and adhering to recommended goals of 1500-1700 calories and 100-120 grams of protein.   Exercise goals:  As is.  Behavioral modification strategies: increasing lean protein intake, decreasing simple carbohydrates, meal planning and cooking strategies, keeping healthy foods in the home, and planning for success.  Jennifer Frazier has agreed to follow-up with our clinic in 3 weeks. She was informed of the importance of frequent follow-up visits to maximize her success with intensive lifestyle modifications for her multiple health conditions.   Jennifer Frazier was informed we would discuss her lab results at her next visit unless there is a critical issue that needs to be addressed sooner. Jennifer Frazier agreed to keep her next visit at the agreed upon time to discuss these results.  Objective:   Blood pressure 129/83, pulse 74, temperature 98.1 F (36.7 C), height 5\' 9"  (1.753 m), weight 212 lb (96.2 kg), SpO2 99 %. Body mass index is 31.31 kg/m.  General: Cooperative, alert, well developed, in no acute distress. HEENT: Conjunctivae and lids unremarkable. Cardiovascular: Regular rhythm.  Lungs: Normal work of breathing. Neurologic: No focal  deficits.   Lab Results  Component Value Date   CREATININE 0.8 09/04/2020   BUN 19 09/04/2020   NA 145 09/04/2020   K 4.0 09/04/2020   CL 105 09/04/2020   CO2 25 01/30/2020   Lab Results  Component Value Date   ALT 13 09/04/2020   AST 20 09/04/2020   ALKPHOS 97 09/04/2020   BILITOT 0.5 01/30/2020   Lab Results   Component Value Date   HGBA1C 5.0 01/30/2020   HGBA1C 4.9 04/18/2019   HGBA1C 5.0 12/08/2018   HGBA1C 5.1 12/16/2017   HGBA1C 5.3 06/24/2017   Lab Results  Component Value Date   INSULIN 4.5 01/30/2020   INSULIN 6.7 04/18/2019   INSULIN 11.0 12/08/2018   INSULIN 13.5 12/16/2017   INSULIN 6.2 06/24/2017   Lab Results  Component Value Date   TSH 1.65 09/04/2020   Lab Results  Component Value Date   CHOL 150 09/04/2020   HDL 84 (A) 09/04/2020   LDLCALC 56 09/04/2020   TRIG 40 09/04/2020   Lab Results  Component Value Date   VD25OH 59.9 04/18/2019   VD25OH 66.6 12/08/2018   VD25OH 59.8 12/16/2017   Lab Results  Component Value Date   WBC 4.0 09/04/2020   HGB 14.3 09/04/2020   HCT 43 09/04/2020   MCV 85.1 11/13/2019   PLT 214 09/04/2020   Attestation Statements:   Reviewed by clinician on day of visit: allergies, medications, problem list, medical history, surgical history, family history, social history, and previous encounter notes.  I, Water quality scientist, CMA, am acting as Location manager for Mina Marble, NP.  I have reviewed the above documentation for accuracy and completeness, and I agree with the above. -  Gayland Nicol d. Shellia Hartl, NP-C

## 2021-01-07 LAB — INSULIN, RANDOM: INSULIN: 3.9 u[IU]/mL (ref 2.6–24.9)

## 2021-01-07 LAB — COMPREHENSIVE METABOLIC PANEL
ALT: 12 IU/L (ref 0–32)
AST: 20 IU/L (ref 0–40)
Albumin/Globulin Ratio: 1.7 (ref 1.2–2.2)
Albumin: 5 g/dL — ABNORMAL HIGH (ref 3.8–4.8)
Alkaline Phosphatase: 108 IU/L (ref 44–121)
BUN/Creatinine Ratio: 18 (ref 9–23)
BUN: 16 mg/dL (ref 6–24)
Bilirubin Total: 0.5 mg/dL (ref 0.0–1.2)
CO2: 26 mmol/L (ref 20–29)
Calcium: 10.1 mg/dL (ref 8.7–10.2)
Chloride: 100 mmol/L (ref 96–106)
Creatinine, Ser: 0.87 mg/dL (ref 0.57–1.00)
Globulin, Total: 3 g/dL (ref 1.5–4.5)
Glucose: 77 mg/dL (ref 70–99)
Potassium: 4.4 mmol/L (ref 3.5–5.2)
Sodium: 141 mmol/L (ref 134–144)
Total Protein: 8 g/dL (ref 6.0–8.5)
eGFR: 83 mL/min/{1.73_m2} (ref >59)

## 2021-01-07 LAB — HEMOGLOBIN A1C
Est. average glucose Bld gHb Est-mCnc: 97 mg/dL
Hgb A1c MFr Bld: 5 % (ref 4.8–5.6)

## 2021-01-07 LAB — VITAMIN D 25 HYDROXY (VIT D DEFICIENCY, FRACTURES): Vit D, 25-Hydroxy: 40.8 ng/mL (ref 30.0–100.0)

## 2021-02-03 ENCOUNTER — Ambulatory Visit (INDEPENDENT_AMBULATORY_CARE_PROVIDER_SITE_OTHER): Payer: 59 | Admitting: Family Medicine

## 2021-02-03 ENCOUNTER — Other Ambulatory Visit: Payer: Self-pay

## 2021-02-03 ENCOUNTER — Encounter (INDEPENDENT_AMBULATORY_CARE_PROVIDER_SITE_OTHER): Payer: Self-pay

## 2021-02-03 ENCOUNTER — Encounter (INDEPENDENT_AMBULATORY_CARE_PROVIDER_SITE_OTHER): Payer: Self-pay | Admitting: Family Medicine

## 2021-02-03 VITALS — BP 134/86 | HR 64 | Temp 97.8°F | Ht 69.0 in | Wt 216.0 lb

## 2021-02-03 DIAGNOSIS — I10 Essential (primary) hypertension: Secondary | ICD-10-CM

## 2021-02-03 DIAGNOSIS — E669 Obesity, unspecified: Secondary | ICD-10-CM | POA: Diagnosis not present

## 2021-02-03 DIAGNOSIS — Z9189 Other specified personal risk factors, not elsewhere classified: Secondary | ICD-10-CM

## 2021-02-03 DIAGNOSIS — Z6831 Body mass index (BMI) 31.0-31.9, adult: Secondary | ICD-10-CM

## 2021-02-03 DIAGNOSIS — Z6841 Body Mass Index (BMI) 40.0 and over, adult: Secondary | ICD-10-CM

## 2021-02-03 MED ORDER — AMLODIPINE BESYLATE 5 MG PO TABS: 5.0000 mg | ORAL_TABLET | Freq: Every day | ORAL | 0 refills | Status: DC

## 2021-02-04 NOTE — Progress Notes (Signed)
Chief Complaint:   OBESITY Jennifer Frazier is here to discuss her progress with her obesity treatment plan along with follow-up of her obesity related diagnoses. Jennifer Frazier is on keeping a food journal and adhering to recommended goals of 1500-1700 calories and 120 grams protein and states she is following her eating plan approximately 70% of the time. Jennifer Frazier states she is walking 30 minutes 5 times per week.  Today's visit was #: 56 Starting weight: 294 lbs Starting date: 06/24/2017 Today's weight: 216 lbs Today's date: 02/03/2021 Total lbs lost to date: 37 Total lbs lost since last in-office visit: +4  Interim History: Jennifer Frazier usually sees [employer]. She overindulged a little over the holidays but was shocked it was a 4 lb weight gain. Pt has been back on plan for 7 days. She likes the structure of being back at work. At last OV, we increased pt's calorie and protein goal due to new IC results. She is tolerating new meal plan well. Pt is drinking 4 bottles of water per day.  Subjective:   1. Essential hypertension Cardiovascular ROS: no chest pain or dyspnea on exertion. Medication: Norvasc  2. At risk for complication associated with hypotension The patient is at a higher than average risk of hypotension due to continued weight loss.  Assessment/Plan:  No orders of the defined types were placed in this encounter.   Medications Discontinued During This Encounter  Medication Reason   amLODipine (NORVASC) 5 MG tablet Reorder     Meds ordered this encounter  Medications   amLODipine (NORVASC) 5 MG tablet    Sig: Take 1 tablet (5 mg total) by mouth daily.    Dispense:  30 tablet    Refill:  0     1. Essential hypertension Jennifer Frazier is working on healthy weight loss and exercise to improve blood pressure control. We will watch for signs of hypotension as she continues her lifestyle modifications.  Refill- amLODipine (NORVASC) 5 MG tablet; Take 1 tablet (5 mg total) by mouth daily.  Dispense:  30 tablet; Refill: 0  2. At risk for complication associated with hypotension Jennifer Frazier was given approximately 9 minutes of education and counseling today to help avoid hypotension. We discussed risks of hypotension with weight loss and signs of hypotension such as feeling lightheaded or unsteady.  Repetitive spaced learning was employed today to elicit superior memory formation and behavioral change.  3. Obesity with current BMI of 31.9  Jennifer Frazier is currently in the action stage of change. As such, her goal is to continue with weight loss efforts. She has agreed to keeping a food journal and adhering to recommended goals of 1500-1700 calories and 110+ grams protein.   Exercise goals:  As is  Behavioral modification strategies: avoiding temptations and planning for success.  Jennifer Frazier has agreed to follow-up with our clinic in 3 weeks with FNP Dawn. She was informed of the importance of frequent follow-up visits to maximize her success with intensive lifestyle modifications for her multiple health conditions.   Objective:   Blood pressure 134/86, pulse 64, temperature 97.8 F (36.6 C), height 5\' 9"  (1.753 m), weight 216 lb (98 kg), SpO2 100 %. Body mass index is 31.9 kg/m.  General: Cooperative, alert, well developed, in no acute distress. HEENT: Conjunctivae and lids unremarkable. Cardiovascular: Regular rhythm.  Lungs: Normal work of breathing. Neurologic: No focal deficits.   Lab Results  Component Value Date   CREATININE 0.87 01/06/2021   BUN 16 01/06/2021   NA 141 01/06/2021  K 4.4 01/06/2021   CL 100 01/06/2021   CO2 26 01/06/2021   Lab Results  Component Value Date   ALT 12 01/06/2021   AST 20 01/06/2021   ALKPHOS 108 01/06/2021   BILITOT 0.5 01/06/2021   Lab Results  Component Value Date   HGBA1C 5.0 01/06/2021   HGBA1C 5.0 01/30/2020   HGBA1C 4.9 04/18/2019   HGBA1C 5.0 12/08/2018   HGBA1C 5.1 12/16/2017   Lab Results  Component Value Date   INSULIN 3.9  01/06/2021   INSULIN 4.5 01/30/2020   INSULIN 6.7 04/18/2019   INSULIN 11.0 12/08/2018   INSULIN 13.5 12/16/2017   Lab Results  Component Value Date   TSH 1.65 09/04/2020   Lab Results  Component Value Date   CHOL 150 09/04/2020   HDL 84 (A) 09/04/2020   LDLCALC 56 09/04/2020   TRIG 40 09/04/2020   Lab Results  Component Value Date   VD25OH 40.8 01/06/2021   VD25OH 59.9 04/18/2019   VD25OH 66.6 12/08/2018   Lab Results  Component Value Date   WBC 4.0 09/04/2020   HGB 14.3 09/04/2020   HCT 43 09/04/2020   MCV 85.1 11/13/2019   PLT 214 09/04/2020    Attestation Statements:   Reviewed by clinician on day of visit: allergies, medications, problem list, medical history, surgical history, family history, social history, and previous encounter notes.  Coral Ceo, CMA, am acting as transcriptionist for [employer], DO.  I have reviewed the above documentation for accuracy and completeness, and I agree with the above. Marjory Sneddon, D.O.  The [place] was signed into law in 2016 which includes the topic of electronic health records.  This provides immediate access to information in MyChart.  This includes consultation notes, operative notes, office notes, lab results and pathology reports.  If you have any questions about what you read please let us know at your next visit so we can discuss your concerns and take corrective action if need be.  We are right here with you.

## 2021-02-24 ENCOUNTER — Encounter (INDEPENDENT_AMBULATORY_CARE_PROVIDER_SITE_OTHER): Payer: Self-pay | Admitting: Family Medicine

## 2021-02-24 ENCOUNTER — Ambulatory Visit (INDEPENDENT_AMBULATORY_CARE_PROVIDER_SITE_OTHER): Payer: 59 | Admitting: Family Medicine

## 2021-02-24 ENCOUNTER — Other Ambulatory Visit: Payer: Self-pay

## 2021-02-24 VITALS — BP 124/82 | HR 75 | Temp 98.0°F | Ht 69.0 in | Wt 216.0 lb

## 2021-02-24 DIAGNOSIS — I10 Essential (primary) hypertension: Secondary | ICD-10-CM

## 2021-02-24 DIAGNOSIS — E8881 Metabolic syndrome: Secondary | ICD-10-CM | POA: Diagnosis not present

## 2021-02-24 DIAGNOSIS — E88819 Insulin resistance, unspecified: Secondary | ICD-10-CM

## 2021-02-24 DIAGNOSIS — E669 Obesity, unspecified: Secondary | ICD-10-CM

## 2021-02-24 DIAGNOSIS — Z6831 Body mass index (BMI) 31.0-31.9, adult: Secondary | ICD-10-CM | POA: Diagnosis not present

## 2021-02-24 MED ORDER — AMLODIPINE BESYLATE 5 MG PO TABS: 5.0000 mg | ORAL_TABLET | Freq: Every day | ORAL | 0 refills | Status: DC

## 2021-02-24 NOTE — Progress Notes (Signed)
Chief Complaint:   OBESITY Jennifer Frazier is here to discuss her progress with her obesity treatment plan along with follow-up of her obesity related diagnoses. Jennifer Frazier is on keeping a food journal and adhering to recommended goals of 1500-1700 calories and 100 grams of protein and states she is following her eating plan approximately 100% of the time. Jennifer Frazier states she is walking for 30 minutes 5 times per week.  Today's visit was #: 35 Starting weight: 294 lbs Starting date: 06/24/2017 Today's weight: 216 lbs Today's date: 02/24/2021 Total lbs lost to date: 78 lbs Total lbs lost since last in-office visit: 0  Interim History: Jennifer Frazier has increased water intake, exercised consistently and journaled consistently since her last OV. She felt she should have lost weight today. She is enjoying the extra calories as IC showed increased RMR. Her protein intake is at goal. Her hunger is controlled.  Subjective:   1. Essential hypertension Jennifer Frazier's blood pressure is controlled. She is currently on Norvasc.  BP Readings from Last 3 Encounters:  02/24/21 124/82  02/03/21 134/86  01/06/21 129/83    2. Insulin resistance Jennifer Frazier polphagia. Her fasting insulin is at goal -3.9.  Assessment/Plan:   1. Essential hypertension We will refill Norvasc .  - amLODipine (NORVASC) 5 MG tablet; Take 1 tablet (5 mg total) by mouth daily.  Dispense: 30 tablet; Refill: 0  2. Insulin resistance Jennifer Frazier will continue taking her medications.   3. Obesity with current BMI of 31.88 Roux is currently in the action stage of change. As such, her goal is to continue with weight loss efforts. She has agreed to keeping a food journal and adhering to recommended goals of 1500-1700 calories and 110 grams of protein daily.  Exercise goals:  Jennifer Frazier will add resistance 2-3 times per week.   Behavioral modification strategies: planning for success.  Jennifer Frazier has agreed to follow-up with our clinic in 3 weeks.  Objective:    Blood pressure 124/82, pulse 75, temperature 98 F (36.7 C), height 5\' 9"  (1.753 m), weight 216 lb (98 kg), SpO2 99 %. Body mass index is 31.9 kg/m.  General: Cooperative, alert, well developed, in no acute distress. HEENT: Conjunctivae and lids unremarkable. Cardiovascular: Regular rhythm.  Lungs: Normal work of breathing. Neurologic: No focal deficits.   Lab Results  Component Value Date   CREATININE 0.87 01/06/2021   BUN 16 01/06/2021   NA 141 01/06/2021   K 4.4 01/06/2021   CL 100 01/06/2021   CO2 26 01/06/2021   Lab Results  Component Value Date   ALT 12 01/06/2021   AST 20 01/06/2021   ALKPHOS 108 01/06/2021   BILITOT 0.5 01/06/2021   Lab Results  Component Value Date   HGBA1C 5.0 01/06/2021   HGBA1C 5.0 01/30/2020   HGBA1C 4.9 04/18/2019   HGBA1C 5.0 12/08/2018   HGBA1C 5.1 12/16/2017   Lab Results  Component Value Date   INSULIN 3.9 01/06/2021   INSULIN 4.5 01/30/2020   INSULIN 6.7 04/18/2019   INSULIN 11.0 12/08/2018   INSULIN 13.5 12/16/2017   Lab Results  Component Value Date   TSH 1.65 09/04/2020   Lab Results  Component Value Date   CHOL 150 09/04/2020   HDL 84 (A) 09/04/2020   LDLCALC 56 09/04/2020   TRIG 40 09/04/2020   Lab Results  Component Value Date   VD25OH 40.8 01/06/2021   VD25OH 59.9 04/18/2019   VD25OH 66.6 12/08/2018   Lab Results  Component Value Date   WBC  4.0 09/04/2020   HGB 14.3 09/04/2020   HCT 43 09/04/2020   MCV 85.1 11/13/2019   PLT 214 09/04/2020   No results found for: IRON, TIBC, FERRITIN  Attestation Statements:   Reviewed by clinician on day of visit: allergies, medications, problem list, medical history, surgical history, family history, social history, and previous encounter notes.  I, Lizbeth Bark, RMA, am acting as Location manager for [employer], [place].  I have reviewed the above documentation for accuracy and completeness, and I agree with the above. -  Georgianne Fick, FNP

## 2021-03-17 ENCOUNTER — Other Ambulatory Visit: Payer: Self-pay

## 2021-03-17 ENCOUNTER — Ambulatory Visit (INDEPENDENT_AMBULATORY_CARE_PROVIDER_SITE_OTHER): Payer: 59 | Admitting: Family Medicine

## 2021-03-17 ENCOUNTER — Encounter (INDEPENDENT_AMBULATORY_CARE_PROVIDER_SITE_OTHER): Payer: Self-pay | Admitting: Family Medicine

## 2021-03-17 VITALS — BP 131/79 | HR 78 | Temp 97.9°F | Ht 69.0 in | Wt 220.0 lb

## 2021-03-17 DIAGNOSIS — Z6832 Body mass index (BMI) 32.0-32.9, adult: Secondary | ICD-10-CM | POA: Diagnosis not present

## 2021-03-17 DIAGNOSIS — E669 Obesity, unspecified: Secondary | ICD-10-CM | POA: Diagnosis not present

## 2021-03-17 DIAGNOSIS — E8881 Metabolic syndrome: Secondary | ICD-10-CM

## 2021-03-17 DIAGNOSIS — I10 Essential (primary) hypertension: Secondary | ICD-10-CM

## 2021-03-17 MED ORDER — AMLODIPINE BESYLATE 5 MG PO TABS: 5.0000 mg | ORAL_TABLET | Freq: Every day | ORAL | 0 refills | Status: DC

## 2021-03-17 NOTE — Progress Notes (Signed)
Chief Complaint:   OBESITY Jennifer Frazier is here to discuss her progress with her obesity treatment plan along with follow-up of her obesity related diagnoses. Jennifer Frazier is on keeping a food journal and adhering to recommended goals of 1500-1700 calories and 110 grams of protein and states she is following her eating plan approximately 100% of the time. Jennifer Frazier states she is walking, lifting weights for 30-45 minutes 5 times per week.  Today's visit was #: 4 Starting weight: 294 lbs Starting date: 06/24/2017 Today's weight: 220 lbs Today's date: 03/17/2021 Total lbs lost to date: 74 lbs Total lbs lost since last in-office visit: 0  Interim History: Jennifer Frazier is quite frustrated with her weight gain despite sticking to plan 100%.  She met calorie and protein goals.  She is doing a great job with exercise.  Her weight goal is 170-180 pounds.  She is not hungry when she eats 1500-1700 calories. However, she gained 4 pounds on 1500-1700 calories/day.  Subjective:   1. Essential hypertension Well controlled.  Denies chest pain, shortness of breath.  BP Readings from Last 3 Encounters:  03/17/21 131/79  02/24/21 124/82  02/03/21 134/86   2. Insulin resistance She has some polyphagia.    Lab Results  Component Value Date   HGBA1C 5.0 01/06/2021   Lab Results  Component Value Date   INSULIN 3.9 01/06/2021   INSULIN 4.5 01/30/2020   INSULIN 6.7 04/18/2019   INSULIN 11.0 12/08/2018   INSULIN 13.5 12/16/2017   Assessment/Plan:   1. Essential hypertension Will refill Norvasc 5 mg today, as per below.  - Refill amLODipine (NORVASC) 5 MG tablet; Take 1 tablet (5 mg total) by mouth daily.  Dispense: 30 tablet; Refill: 0  2. Insulin resistance Continue meal plan.  3. Obesity with current BMI of 32.47  Jennifer Frazier is currently in the action stage of change. As such, her goal is to continue with weight loss efforts. She has agreed to keeping a food journal and adhering to recommended goals of  1200-1400 calories and 90 grams of protein.   Exercise goals:  Increase intensity of exercise.  Behavioral modification strategies: meal planning and cooking strategies.  Jennifer Frazier has agreed to follow-up with our clinic in 3-4 weeks.  Objective:   Blood pressure 131/79, pulse 78, temperature 97.9 F (36.6 C), height '5\' 9"'  (1.753 m), weight 220 lb (99.8 kg), SpO2 99 %. Body mass index is 32.49 kg/m.  General: Cooperative, alert, well developed, in no acute distress. HEENT: Conjunctivae and lids unremarkable. Cardiovascular: Regular rhythm.  Lungs: Normal work of breathing. Neurologic: No focal deficits.   Lab Results  Component Value Date   CREATININE 0.87 01/06/2021   BUN 16 01/06/2021   NA 141 01/06/2021   K 4.4 01/06/2021   CL 100 01/06/2021   CO2 26 01/06/2021   Lab Results  Component Value Date   ALT 12 01/06/2021   AST 20 01/06/2021   ALKPHOS 108 01/06/2021   BILITOT 0.5 01/06/2021   Lab Results  Component Value Date   HGBA1C 5.0 01/06/2021   HGBA1C 5.0 01/30/2020   HGBA1C 4.9 04/18/2019   HGBA1C 5.0 12/08/2018   HGBA1C 5.1 12/16/2017   Lab Results  Component Value Date   INSULIN 3.9 01/06/2021   INSULIN 4.5 01/30/2020   INSULIN 6.7 04/18/2019   INSULIN 11.0 12/08/2018   INSULIN 13.5 12/16/2017   Lab Results  Component Value Date   TSH 1.65 09/04/2020   Lab Results  Component Value Date   CHOL  150 09/04/2020   HDL 84 (A) 09/04/2020   LDLCALC 56 09/04/2020   TRIG 40 09/04/2020   Lab Results  Component Value Date   VD25OH 40.8 01/06/2021   VD25OH 59.9 04/18/2019   VD25OH 66.6 12/08/2018   Lab Results  Component Value Date   WBC 4.0 09/04/2020   HGB 14.3 09/04/2020   HCT 43 09/04/2020   MCV 85.1 11/13/2019   PLT 214 09/04/2020   Attestation Statements:   Reviewed by clinician on day of visit: allergies, medications, problem list, medical history, surgical history, family history, social history, and previous encounter notes.  I, Network engineer, CMA, am acting as Location manager for [employer], [place].  I have reviewed the above documentation for accuracy and completeness, and I agree with the above. -  Georgianne Fick, FNP

## 2021-04-09 ENCOUNTER — Other Ambulatory Visit: Payer: Self-pay

## 2021-04-09 ENCOUNTER — Ambulatory Visit (INDEPENDENT_AMBULATORY_CARE_PROVIDER_SITE_OTHER): Payer: 59 | Admitting: Adult Health

## 2021-04-09 ENCOUNTER — Encounter (INDEPENDENT_AMBULATORY_CARE_PROVIDER_SITE_OTHER): Payer: Self-pay | Admitting: Adult Health

## 2021-04-09 VITALS — BP 138/80 | HR 71 | Temp 98.2°F | Ht 69.0 in | Wt 213.0 lb

## 2021-04-09 DIAGNOSIS — I10 Essential (primary) hypertension: Secondary | ICD-10-CM | POA: Diagnosis not present

## 2021-04-09 DIAGNOSIS — Z6831 Body mass index (BMI) 31.0-31.9, adult: Secondary | ICD-10-CM | POA: Diagnosis not present

## 2021-04-09 DIAGNOSIS — E669 Obesity, unspecified: Secondary | ICD-10-CM | POA: Diagnosis not present

## 2021-04-09 MED ORDER — AMLODIPINE BESYLATE 5 MG PO TABS: 5.0000 mg | ORAL_TABLET | Freq: Every day | ORAL | 0 refills | Status: DC

## 2021-04-09 NOTE — Progress Notes (Signed)
? ? ? ?Chief Complaint:  ? ?OBESITY ?Jennifer Frazier is here to discuss her progress with her obesity treatment plan along with follow-up of her obesity related diagnoses. Jennifer Frazier is on keeping a food journal and adhering to recommended goals of 1200-1400 calories and 90 grams of protein and states she is following her eating plan approximately 100% of the time. Jennifer Frazier states she is doing cardio for 30+ minutes 5 times per week. ? ?Today's visit was #: 56 ?Starting weight: 294 lbs ?Starting date: 06/24/2017 ?Today's weight: 213 lbs ?Today's date: 04/09/2021 ?Total lbs lost to date: 81 lbs ?Total lbs lost since last in-office visit: 7 lbs ? ?Interim History:  ?01/06/21- ?Jennifer Frazier's RMR was 1463 in 01/2020.  RMR today is 2232. ?Increase of metabolism by 769. ?She reports constant hunger levels when eating on current journaling plan. ?03/17/21-  ?Jennifer Frazier is quite frustrated with her weight gain despite sticking to plan 100%.  She met calorie and protein goals.  She is doing a great job with exercise.  Her weight goal is 170-180 pounds.  She is not hungry when she eats 1500-1700 calories. However, she gained 4 pounds on 1500-1700 calories/day. ? ?THEREFORE -Calorie/protein goals reduced at last office visit- currently on 1200-1400 cal and 90g protein/day, she reports cal intake 1200-1250/day and always consuming >90g/day. ? ?Typical daily intake: ?Breakfast:  Coffee, [employer] bar, cheese, yogurt. ?Lunch:  Bag of cabbage, 2 tbsp feta cheese, [employer] chips, [employer] dressing. ?Dinner:  [employer]	[place], cheese, keto bun, vegetables. ? ?Of note:  Declined GLP-1 therapy initiation on 11/12/2020. ? ?Subjective:  ? ?1. Essential hypertension ?BP/HR stable at office visit. ?On amlodipine 5 mg daily - denies lower extremity edema. ?She reports 10 mg strength caused pedal edema. ?She denies acute cardiac sx's at present. ? ?Assessment/Plan:  ? ?1. Essential hypertension ?Refill amlodipine 5 mg daily. ? ?- Refill amLODipine (NORVASC) 5 MG  tablet; Take 1 tablet (5 mg total) by mouth daily.  Dispense: 90 tablet; Refill: 0 ? ?2. Obesity with current BMI of 31.5 ? ?Jennifer Frazier is currently in the action stage of change. As such, her goal is to continue with weight loss efforts. She has agreed to keeping a food journal and adhering to recommended goals of 1200-1350 calories and >90+ grams of protein.  ? ?Exercise goals:  As is. ? ?Behavioral modification strategies: increasing lean protein intake, decreasing simple carbohydrates, meal planning and cooking strategies, keeping healthy foods in the home, planning for success, and keeping a strict food journal. ? ?Jennifer Frazier has agreed to follow-up with our clinic in 3 weeks. She was informed of the importance of frequent follow-up visits to maximize her success with intensive lifestyle modifications for her multiple health conditions.  ? ?Objective:  ? ?Blood pressure 138/80, pulse 71, temperature 98.2 ?F (36.8 ?C), height '5\' 9"'  (1.753 m), weight 213 lb (96.6 kg), SpO2 100 %. ?Body mass index is 31.45 kg/m?. ? ?General: Cooperative, alert, well developed, in no acute distress. ?HEENT: Conjunctivae and lids unremarkable. ?Cardiovascular: Regular rhythm.  ?Lungs: Normal work of breathing. ?Neurologic: No focal deficits.  ? ?Lab Results  ?Component Value Date  ? CREATININE 0.87 01/06/2021  ? BUN 16 01/06/2021  ? NA 141 01/06/2021  ? K 4.4 01/06/2021  ? CL 100 01/06/2021  ? CO2 26 01/06/2021  ? ?Lab Results  ?Component Value Date  ? ALT 12 01/06/2021  ? AST 20 01/06/2021  ? ALKPHOS 108 01/06/2021  ? BILITOT 0.5 01/06/2021  ? ?Lab Results  ?Component Value Date  ?  HGBA1C 5.0 01/06/2021  ? HGBA1C 5.0 01/30/2020  ? HGBA1C 4.9 04/18/2019  ? HGBA1C 5.0 12/08/2018  ? HGBA1C 5.1 12/16/2017  ? ?Lab Results  ?Component Value Date  ? INSULIN 3.9 01/06/2021  ? INSULIN 4.5 01/30/2020  ? INSULIN 6.7 04/18/2019  ? INSULIN 11.0 12/08/2018  ? INSULIN 13.5 12/16/2017  ? ?Lab Results  ?Component Value Date  ? TSH 1.65 09/04/2020  ? ?Lab  Results  ?Component Value Date  ? CHOL 150 09/04/2020  ? HDL 84 (A) 09/04/2020  ? [place] 56 09/04/2020  ? TRIG 40 09/04/2020  ? ?Lab Results  ?Component Value Date  ? VD25OH 40.8 01/06/2021  ? VD25OH 59.9 04/18/2019  ? VD25OH 66.6 12/08/2018  ? ?Lab Results  ?Component Value Date  ? WBC 4.0 09/04/2020  ? HGB 14.3 09/04/2020  ? HCT 43 09/04/2020  ? MCV 85.1 11/13/2019  ? PLT 214 09/04/2020  ? ?Attestation Statements:  ? ?Reviewed by clinician on day of visit: allergies, medications, problem list, medical history, surgical history, family history, social history, and previous encounter notes. ? ?I, Water quality scientist, CMA, am acting as Location manager for Mina Marble, NP. ? ?I have reviewed the above documentation for accuracy and completeness, and I agree with the above. -  Aubery Date d. Jashay Roddy, NP-C ?

## 2021-05-01 ENCOUNTER — Ambulatory Visit (INDEPENDENT_AMBULATORY_CARE_PROVIDER_SITE_OTHER): Payer: 59 | Admitting: Physician Assistant

## 2021-05-01 ENCOUNTER — Encounter (INDEPENDENT_AMBULATORY_CARE_PROVIDER_SITE_OTHER): Payer: Self-pay | Admitting: Physician Assistant

## 2021-05-01 VITALS — BP 130/85 | HR 79 | Temp 98.0°F | Ht 69.0 in | Wt 215.0 lb

## 2021-05-01 DIAGNOSIS — E8881 Metabolic syndrome: Secondary | ICD-10-CM | POA: Diagnosis not present

## 2021-05-01 DIAGNOSIS — Z6831 Body mass index (BMI) 31.0-31.9, adult: Secondary | ICD-10-CM

## 2021-05-01 DIAGNOSIS — E669 Obesity, unspecified: Secondary | ICD-10-CM

## 2021-05-02 NOTE — Progress Notes (Signed)
? ? ? ?Chief Complaint:  ? ?OBESITY ?Jennifer Frazier is here to discuss her progress with her obesity treatment plan along with follow-up of her obesity related diagnoses. Jennifer Frazier is on keeping a food journal and adhering to recommended goals of 1200-1350 calories and 90 grams of protein and states she is following her eating plan approximately 100% of the time. Jennifer Frazier states she is walking for 30 minutes 5 times per week. ? ?Today's visit was #: 62 ?Starting weight: 294 lbs ?Starting date: 06/24/2017 ?Today's weight: 215 lbs ?Today's date: 05/01/2021 ?Total lbs lost to date: 79 lbs ?Total lbs lost since last in-office visit: 0 ? ?Interim History: Jennifer Frazier averaged 1250-1350 calories and 105 grams of protein. She is hungry during the day, especially when she has a deadline at work.  At those times she tends to want to snack. She is especially hungry after dinner. She is eating mainly plant based. We discussed increasing protein to at least 120 grams daily based on the latest studies, using her current weight.   ? ?Subjective:  ? ?1. Insulin resistance ?Jennifer Frazier endorses polyphagia despite last insulin and A1C levels being normal.  ? ?Assessment/Plan:  ? ?1. Insulin resistance ?Jennifer Frazier will increase calories and protein amounts based on her RMR. She will continue to work on weight loss, exercise, and decreasing simple carbohydrates to help decrease the risk of diabetes. Jennifer Frazier agreed to follow-up with [name] as directed to closely monitor her progress. ? ?2. Obesity with current BMI of 31.5 ?Jennifer Frazier is currently in the action stage of change. As such, her goal is to continue with weight loss efforts. She has agreed to keeping a food journal and adhering to recommended goals of 1400-1600 calories and 120 grams of protein.  ? ?Jennifer Frazier will add strength training.  ? ?Exercise goals:  As is. Jennifer Frazier will add strength training 2 times per week.  ? ?Behavioral modification strategies: increasing lean protein intake and meal planning and cooking  strategies. ? ?Jennifer Frazier has agreed to follow-up with our clinic in 3 weeks. She was informed of the importance of frequent follow-up visits to maximize her success with intensive lifestyle modifications for her multiple health conditions.  ? ?Objective:  ? ?Blood pressure 130/85, pulse 79, temperature 98 ?F (36.7 ?C), height '5\' 9"'$  (1.753 m), weight 215 lb (97.5 kg), SpO2 100 %. ?Body mass index is 31.75 kg/m?. ? ?General: Cooperative, alert, well developed, in no acute distress. ?HEENT: Conjunctivae and lids unremarkable. ?Cardiovascular: Regular rhythm.  ?Lungs: Normal work of breathing. ?Neurologic: No focal deficits.  ? ?Lab Results  ?Component Value Date  ? CREATININE 0.87 01/06/2021  ? BUN 16 01/06/2021  ? NA 141 01/06/2021  ? K 4.4 01/06/2021  ? CL 100 01/06/2021  ? CO2 26 01/06/2021  ? ?Lab Results  ?Component Value Date  ? ALT 12 01/06/2021  ? AST 20 01/06/2021  ? ALKPHOS 108 01/06/2021  ? BILITOT 0.5 01/06/2021  ? ?Lab Results  ?Component Value Date  ? HGBA1C 5.0 01/06/2021  ? HGBA1C 5.0 01/30/2020  ? HGBA1C 4.9 04/18/2019  ? HGBA1C 5.0 12/08/2018  ? HGBA1C 5.1 12/16/2017  ? ?Lab Results  ?Component Value Date  ? INSULIN 3.9 01/06/2021  ? INSULIN 4.5 01/30/2020  ? INSULIN 6.7 04/18/2019  ? INSULIN 11.0 12/08/2018  ? INSULIN 13.5 12/16/2017  ? ?Lab Results  ?Component Value Date  ? TSH 1.65 09/04/2020  ? ?Lab Results  ?Component Value Date  ? CHOL 150 09/04/2020  ? HDL 84 (A) 09/04/2020  ? [place] 56  09/04/2020  ? TRIG 40 09/04/2020  ? ?Lab Results  ?Component Value Date  ? VD25OH 40.8 01/06/2021  ? VD25OH 59.9 04/18/2019  ? VD25OH 66.6 12/08/2018  ? ?Lab Results  ?Component Value Date  ? WBC 4.0 09/04/2020  ? HGB 14.3 09/04/2020  ? HCT 43 09/04/2020  ? MCV 85.1 11/13/2019  ? PLT 214 09/04/2020  ? ?No results found for: IRON, TIBC, FERRITIN ? ?Attestation Statements:  ? ?Reviewed by clinician on day of visit: allergies, medications, problem list, medical history, surgical history, family history, social  history, and previous encounter notes. ? ?Time spent on visit including pre-visit chart review and post-visit care and charting was 42 minutes.  ? ?I, Tonye Pearson, am acting as Location manager for [employer], PA-C. ? ?I have reviewed the above documentation for accuracy and completeness, and I agree with the above. Abby Potash, PA-C ? ?

## 2021-05-22 ENCOUNTER — Ambulatory Visit (INDEPENDENT_AMBULATORY_CARE_PROVIDER_SITE_OTHER): Payer: 59 | Admitting: Family Medicine

## 2021-05-22 ENCOUNTER — Encounter (INDEPENDENT_AMBULATORY_CARE_PROVIDER_SITE_OTHER): Payer: Self-pay | Admitting: Family Medicine

## 2021-05-22 VITALS — HR 71 | Temp 98.3°F | Ht 69.0 in | Wt 216.0 lb

## 2021-05-22 DIAGNOSIS — E669 Obesity, unspecified: Secondary | ICD-10-CM | POA: Diagnosis not present

## 2021-05-22 DIAGNOSIS — I10 Essential (primary) hypertension: Secondary | ICD-10-CM | POA: Diagnosis not present

## 2021-05-22 DIAGNOSIS — Z6831 Body mass index (BMI) 31.0-31.9, adult: Secondary | ICD-10-CM

## 2021-05-26 MED ORDER — AMLODIPINE BESYLATE 5 MG PO TABS: 5.0000 mg | ORAL_TABLET | Freq: Every day | ORAL | 0 refills | Status: DC

## 2021-06-05 NOTE — Progress Notes (Signed)
Chief Complaint:   OBESITY Jennifer Frazier is here to discuss her progress with her obesity treatment plan along with follow-up of her obesity related diagnoses. Jennifer Frazier is on keeping a food journal and adhering to recommended goals of 1400-1600 calories and 120 grams of protein daily and states she is following her eating plan approximately 100% of the time. Jennifer Frazier states she is walking and strength training for 30 minutes 5 times per week.  Today's visit was #: 60 Starting weight: 294 lbs Starting date: 06/24/2017 Today's weight: 216 lbs Today's date: 05/22/2021 Total lbs lost to date: 11 Total lbs lost since last in-office visit: 0  Interim History: Jennifer Frazier is working on increasing protein and eating more plant-based. She rely's on protein supplements frequently and she is increasing strength training. She is retaining some water weight today.   Subjective:   1. Essential hypertension Jennifer Frazier is working on her diet and weight loss. No side effects were noted.   Assessment/Plan:   1. Essential hypertension We will refill amlodipine for 90 days with no refills. Jennifer Frazier will continue working on healthy weight loss and exercise to improve blood pressure control.   - amLODipine (NORVASC) 5 MG tablet; Take 1 tablet (5 mg total) by mouth daily.  Dispense: 90 tablet; Refill: 0  2. Obesity with current BMI of 31.9 Jennifer Frazier is currently in the action stage of change. As such, her goal is to continue with weight loss efforts. She has agreed to keeping a food journal and adhering to recommended goals of 1300-1600 calories and 120+ grams of protein daily.   Exercise goals: As is.   Behavioral modification strategies: increasing lean protein intake and meal planning and cooking strategies.  Jennifer Frazier has agreed to follow-up with our clinic in 3 to 4 weeks. She was informed of the importance of frequent follow-up visits to maximize her success with intensive lifestyle modifications for her multiple health  conditions.   Objective:   Pulse 71, temperature 98.3 F (36.8 C), height '5\' 9"'$  (1.753 m), weight 216 lb (98 kg), SpO2 100 %. Body mass index is 31.9 kg/m.  General: Cooperative, alert, well developed, in no acute distress. HEENT: Conjunctivae and lids unremarkable. Cardiovascular: Regular rhythm.  Lungs: Normal work of breathing. Neurologic: No focal deficits.   Lab Results  Component Value Date   CREATININE 0.87 01/06/2021   BUN 16 01/06/2021   NA 141 01/06/2021   K 4.4 01/06/2021   CL 100 01/06/2021   CO2 26 01/06/2021   Lab Results  Component Value Date   ALT 12 01/06/2021   AST 20 01/06/2021   ALKPHOS 108 01/06/2021   BILITOT 0.5 01/06/2021   Lab Results  Component Value Date   HGBA1C 5.0 01/06/2021   HGBA1C 5.0 01/30/2020   HGBA1C 4.9 04/18/2019   HGBA1C 5.0 12/08/2018   HGBA1C 5.1 12/16/2017   Lab Results  Component Value Date   INSULIN 3.9 01/06/2021   INSULIN 4.5 01/30/2020   INSULIN 6.7 04/18/2019   INSULIN 11.0 12/08/2018   INSULIN 13.5 12/16/2017   Lab Results  Component Value Date   TSH 1.65 09/04/2020   Lab Results  Component Value Date   CHOL 150 09/04/2020   HDL 84 (A) 09/04/2020   LDLCALC 56 09/04/2020   TRIG 40 09/04/2020   Lab Results  Component Value Date   VD25OH 40.8 01/06/2021   VD25OH 59.9 04/18/2019   VD25OH 66.6 12/08/2018   Lab Results  Component Value Date   WBC 4.0 09/04/2020  HGB 14.3 09/04/2020   HCT 43 09/04/2020   MCV 85.1 11/13/2019   PLT 214 09/04/2020   No results found for: IRON, TIBC, FERRITIN  Attestation Statements:   Reviewed by clinician on day of visit: allergies, medications, problem list, medical history, surgical history, family history, social history, and previous encounter notes.  Time spent on visit including pre-visit chart review and post-visit care and charting was 32 minutes.    I, Trixie Dredge, am acting as transcriptionist for Dennard Nip, MD.  I have reviewed the above  documentation for accuracy and completeness, and I agree with the above. -  Dennard Nip, MD

## 2021-06-18 ENCOUNTER — Encounter (INDEPENDENT_AMBULATORY_CARE_PROVIDER_SITE_OTHER): Payer: Self-pay | Admitting: Family Medicine

## 2021-06-18 ENCOUNTER — Ambulatory Visit (INDEPENDENT_AMBULATORY_CARE_PROVIDER_SITE_OTHER): Payer: 59 | Admitting: Family Medicine

## 2021-06-18 VITALS — BP 145/84 | HR 73 | Temp 98.5°F | Ht 69.0 in | Wt 217.0 lb

## 2021-06-18 DIAGNOSIS — N951 Menopausal and female climacteric states: Secondary | ICD-10-CM | POA: Diagnosis not present

## 2021-06-18 DIAGNOSIS — Z6832 Body mass index (BMI) 32.0-32.9, adult: Secondary | ICD-10-CM

## 2021-06-18 DIAGNOSIS — E669 Obesity, unspecified: Secondary | ICD-10-CM

## 2021-06-18 DIAGNOSIS — I10 Essential (primary) hypertension: Secondary | ICD-10-CM | POA: Diagnosis not present

## 2021-06-18 DIAGNOSIS — Z9189 Other specified personal risk factors, not elsewhere classified: Secondary | ICD-10-CM

## 2021-06-18 MED ORDER — AMLODIPINE BESYLATE 5 MG PO TABS: 5.0000 mg | ORAL_TABLET | Freq: Every day | ORAL | 0 refills | Status: DC

## 2021-07-01 NOTE — Progress Notes (Signed)
Chief Complaint:   OBESITY Jennifer Frazier is here to discuss her progress with her obesity treatment plan along with follow-up of her obesity related diagnoses. Jennifer Frazier is on keeping a food journal and adhering to recommended goals of 1300-1600 calories and 120+ grams of protein daily and states she is following her eating plan approximately 95% of the time. Jennifer Frazier states she is walking and lifting weights for 30-45 minutes 5 times per week.  Today's visit was #: 24 Starting weight: 294 lbs Starting date: 06/24/2017 Today's weight: 217 lbs Today's date: 06/18/2021 Total lbs lost to date: 77 Total lbs lost since last in-office visit: 0  Interim History: Jennifer Frazier continues to work on [employer], [employer] and exercise. She is losing fat but she is retaining some fluid.   Subjective:   1. Essential hypertension Jennifer Frazier's blood pressure is mildly elevated today, but it is normally well controlled.   2. Perimenopause Jennifer Frazier is having irregular cycles, some hot flashes, and less then ideal sleep. She wonders if she is in pre-menopause.   3. At risk for heart disease Jennifer Frazier is at higher than average risk for cardiovascular disease due to obesity.  Assessment/Plan:   1. Essential hypertension We will refill Norvasc for 1 month. Janesha will continue working on healthy weight loss and exercise to improve blood pressure control. We will watch for signs of hypotension as she continues her lifestyle modifications.  - amLODipine (NORVASC) 5 MG tablet; Take 1 tablet (5 mg total) by mouth daily.  Dispense: 30 tablet; Refill: 0  2. Perimenopause Jennifer Frazier was educated on pre-menopause and the need to track any bleeding so she can know when it has been over 1 year. She will continue her diet and exercise to help manage her symptoms.   3. At risk for heart disease Jennifer Frazier was given approximately 15 minutes of coronary artery disease prevention counseling today. She is 46 y.o. female and has risk factors for heart disease  including obesity. We discussed intensive lifestyle modifications today with an emphasis on specific weight loss instructions and strategies.  Repetitive spaced learning was employed today to elicit superior memory formation and behavioral change.   4. Obesity, Current BMI 32.2 Jennifer Frazier is currently in the action stage of change. As such, her goal is to continue with weight loss efforts. She has agreed to keeping a food journal and adhering to recommended goals of 1300-1600 calories and 120+ grams of protein daily.   Exercise goals: As is.   Behavioral modification strategies: increasing lean protein intake.  Jennifer Frazier has agreed to follow-up with our clinic in 4 weeks. She was informed of the importance of frequent follow-up visits to maximize her success with intensive lifestyle modifications for her multiple health conditions.   Objective:   Blood pressure (!) 145/84, pulse 73, temperature 98.5 F (36.9 C), height '5\' 9"'$  (1.753 m), weight 217 lb (98.4 kg), SpO2 99 %. Body mass index is 32.05 kg/m.  General: Cooperative, alert, well developed, in no acute distress. HEENT: Conjunctivae and lids unremarkable. Cardiovascular: Regular rhythm.  Lungs: Normal work of breathing. Neurologic: No focal deficits.   Lab Results  Component Value Date   CREATININE 0.87 01/06/2021   BUN 16 01/06/2021   NA 141 01/06/2021   K 4.4 01/06/2021   CL 100 01/06/2021   CO2 26 01/06/2021   Lab Results  Component Value Date   ALT 12 01/06/2021   AST 20 01/06/2021   ALKPHOS 108 01/06/2021   BILITOT 0.5 01/06/2021   Lab Results  Component Value Date   HGBA1C 5.0 01/06/2021   HGBA1C 5.0 01/30/2020   HGBA1C 4.9 04/18/2019   HGBA1C 5.0 12/08/2018   HGBA1C 5.1 12/16/2017   Lab Results  Component Value Date   INSULIN 3.9 01/06/2021   INSULIN 4.5 01/30/2020   INSULIN 6.7 04/18/2019   INSULIN 11.0 12/08/2018   INSULIN 13.5 12/16/2017   Lab Results  Component Value Date   TSH 1.65 09/04/2020    Lab Results  Component Value Date   CHOL 150 09/04/2020   HDL 84 (A) 09/04/2020   LDLCALC 56 09/04/2020   TRIG 40 09/04/2020   Lab Results  Component Value Date   VD25OH 40.8 01/06/2021   VD25OH 59.9 04/18/2019   VD25OH 66.6 12/08/2018   Lab Results  Component Value Date   WBC 4.0 09/04/2020   HGB 14.3 09/04/2020   HCT 43 09/04/2020   MCV 85.1 11/13/2019   PLT 214 09/04/2020   No results found for: IRON, TIBC, FERRITIN  Attestation Statements:   Reviewed by clinician on day of visit: allergies, medications, problem list, medical history, surgical history, family history, social history, and previous encounter notes.   I, Trixie Dredge, am acting as transcriptionist for Dennard Nip, MD.  I have reviewed the above documentation for accuracy and completeness, and I agree with the above. -  Dennard Nip, MD

## 2021-07-15 ENCOUNTER — Ambulatory Visit (INDEPENDENT_AMBULATORY_CARE_PROVIDER_SITE_OTHER): Payer: 59 | Admitting: Family Medicine

## 2021-07-15 ENCOUNTER — Encounter (INDEPENDENT_AMBULATORY_CARE_PROVIDER_SITE_OTHER): Payer: Self-pay | Admitting: Family Medicine

## 2021-07-15 VITALS — BP 138/89 | HR 65 | Temp 98.2°F | Ht 69.0 in | Wt 216.0 lb

## 2021-07-15 DIAGNOSIS — E669 Obesity, unspecified: Secondary | ICD-10-CM | POA: Diagnosis not present

## 2021-07-15 DIAGNOSIS — F439 Reaction to severe stress, unspecified: Secondary | ICD-10-CM | POA: Diagnosis not present

## 2021-07-15 DIAGNOSIS — I10 Essential (primary) hypertension: Secondary | ICD-10-CM

## 2021-07-15 DIAGNOSIS — Z6832 Body mass index (BMI) 32.0-32.9, adult: Secondary | ICD-10-CM | POA: Diagnosis not present

## 2021-07-15 MED ORDER — AMLODIPINE BESYLATE 5 MG PO TABS: 5.0000 mg | ORAL_TABLET | Freq: Every day | ORAL | 0 refills | Status: DC

## 2021-07-15 MED ORDER — BUPROPION HCL ER (SR) 150 MG PO TB12: 150.0000 mg | ORAL_TABLET | Freq: Every day | ORAL | 0 refills | Status: DC

## 2021-07-16 NOTE — Progress Notes (Signed)
Chief Complaint:   OBESITY Jennifer Frazier is here to discuss her progress with her obesity treatment plan along with follow-up of her obesity related diagnoses. Jennifer Frazier is on keeping a food journal and adhering to recommended goals of 1300-1600 calories and 120+ grams of protein daily and states she is following her eating plan approximately 95% of the time. Jennifer Frazier states she is walking for 30 minutes 5 times per week, and doing strengthening for 30 minutes 2 times per week.  Today's visit was #: 45 Starting weight: 294 lbs Starting date: 06/24/2017 Today's weight: 216 lbs Today's date: 07/15/2021 Total lbs lost to date: 17 Total lbs lost since last in-office visit: 1  Interim History: Jennifer Frazier continues to do well with weight loss.  She is getting a bit tired of having to monitor her food intake so diligently, but she is happy with her weight loss.  Subjective:   1. Stress Jennifer Frazier notes feeling exhausted and feeling overwhelmed.  She notes feeling more down when it is not sunny outside.  She admits to some perfectionist tendencies.  2. Essential hypertension Jennifer Frazier's blood pressure is stable on her medications, and with diet and weight loss.  Assessment/Plan:   1. Stress Jennifer Frazier agreed to start Wellbutrin SR 150 mg every morning, with no refills.  We will follow-up at her next visit.  - buPROPion (WELLBUTRIN SR) 150 MG 12 hr tablet; Take 1 tablet (150 mg total) by mouth daily.  Dispense: 30 tablet; Refill: 0  2. Essential hypertension Jennifer Frazier will continue her medications, diet, and weight loss.  We will refill amlodipine for 1 month.  - amLODipine (NORVASC) 5 MG tablet; Take 1 tablet (5 mg total) by mouth daily.  Dispense: 30 tablet; Refill: 0  3. Obesity, Current BMI 32.0 Jennifer Frazier is currently in the action stage of change. As such, her goal is to continue with weight loss efforts. She has agreed to keeping a food journal and adhering to recommended goals of 1300-1600 calories and 120+ grams of  protein daily.   Exercise goals: As is.   Behavioral modification strategies: increasing lean protein intake and meal planning and cooking strategies.  Averil has agreed to follow-up with our clinic in 3 to 4 weeks. She was informed of the importance of frequent follow-up visits to maximize her success with intensive lifestyle modifications for her multiple health conditions.   Objective:   Blood pressure 138/89, pulse 65, temperature 98.2 F (36.8 C), height '5\' 9"'$  (1.753 m), weight 216 lb (98 kg), SpO2 100 %. Body mass index is 31.9 kg/m.  General: Cooperative, alert, well developed, in no acute distress. HEENT: Conjunctivae and lids unremarkable. Cardiovascular: Regular rhythm.  Lungs: Normal work of breathing. Neurologic: No focal deficits.   Lab Results  Component Value Date   CREATININE 0.87 01/06/2021   BUN 16 01/06/2021   NA 141 01/06/2021   K 4.4 01/06/2021   CL 100 01/06/2021   CO2 26 01/06/2021   Lab Results  Component Value Date   ALT 12 01/06/2021   AST 20 01/06/2021   ALKPHOS 108 01/06/2021   BILITOT 0.5 01/06/2021   Lab Results  Component Value Date   HGBA1C 5.0 01/06/2021   HGBA1C 5.0 01/30/2020   HGBA1C 4.9 04/18/2019   HGBA1C 5.0 12/08/2018   HGBA1C 5.1 12/16/2017   Lab Results  Component Value Date   INSULIN 3.9 01/06/2021   INSULIN 4.5 01/30/2020   INSULIN 6.7 04/18/2019   INSULIN 11.0 12/08/2018   INSULIN 13.5 12/16/2017  Lab Results  Component Value Date   TSH 1.65 09/04/2020   Lab Results  Component Value Date   CHOL 150 09/04/2020   HDL 84 (A) 09/04/2020   LDLCALC 56 09/04/2020   TRIG 40 09/04/2020   Lab Results  Component Value Date   VD25OH 40.8 01/06/2021   VD25OH 59.9 04/18/2019   VD25OH 66.6 12/08/2018   Lab Results  Component Value Date   WBC 4.0 09/04/2020   HGB 14.3 09/04/2020   HCT 43 09/04/2020   MCV 85.1 11/13/2019   PLT 214 09/04/2020   No results found for: "IRON", "TIBC", "FERRITIN"  Attestation  Statements:   Reviewed by clinician on day of visit: allergies, medications, problem list, medical history, surgical history, family history, social history, and previous encounter notes.   I, Trixie Dredge, am acting as transcriptionist for Dennard Nip, MD.  I have reviewed the above documentation for accuracy and completeness, and I agree with the above. -  Dennard Nip, MD

## 2021-08-05 ENCOUNTER — Ambulatory Visit (INDEPENDENT_AMBULATORY_CARE_PROVIDER_SITE_OTHER): Payer: 59 | Admitting: Family Medicine

## 2021-08-05 ENCOUNTER — Encounter (INDEPENDENT_AMBULATORY_CARE_PROVIDER_SITE_OTHER): Payer: Self-pay | Admitting: Family Medicine

## 2021-08-05 VITALS — BP 130/86 | HR 67 | Temp 98.1°F | Ht 69.0 in | Wt 217.0 lb

## 2021-08-05 DIAGNOSIS — E669 Obesity, unspecified: Secondary | ICD-10-CM | POA: Diagnosis not present

## 2021-08-05 DIAGNOSIS — Z6832 Body mass index (BMI) 32.0-32.9, adult: Secondary | ICD-10-CM | POA: Diagnosis not present

## 2021-08-05 DIAGNOSIS — I10 Essential (primary) hypertension: Secondary | ICD-10-CM

## 2021-08-05 DIAGNOSIS — F3289 Other specified depressive episodes: Secondary | ICD-10-CM | POA: Diagnosis not present

## 2021-08-05 MED ORDER — BUPROPION HCL ER (SR) 200 MG PO TB12: 200.0000 mg | ORAL_TABLET | Freq: Every day | ORAL | 0 refills | Status: DC

## 2021-08-05 MED ORDER — AMLODIPINE BESYLATE 5 MG PO TABS: 5.0000 mg | ORAL_TABLET | Freq: Every day | ORAL | 0 refills | Status: DC

## 2021-08-05 NOTE — Progress Notes (Unsigned)
Chief Complaint:   OBESITY Jennifer Frazier is here to discuss her progress with her obesity treatment plan along with follow-up of her obesity related diagnoses. Jennifer Frazier is on keeping a food journal and adhering to recommended goals of 1300-1600 calories and 120+ grams of protein daily and states she is following her eating plan approximately 95% of the time. Jennifer Frazier states she is walking for 30 minutes and doing strengthening for 30 minutes 5 times per week.  Today's visit was #: 80 Starting weight: 294 lbs Starting date: 06/24/2017 Today's weight: 217 lbs Today's date: 08/05/2021 Total lbs lost to date: 77 Total lbs lost since last in-office visit: 0  Interim History: Jennifer Frazier continues to work on her diet and weight loss.  She is working on meeting her protein goals mostly with "real food" protein sources versus protein supplements.  She has increased her weights from 5 pounds to 10 pounds.  Subjective:   1. Essential hypertension Jennifer Frazier's blood pressure is stable on her medications, with no side effects noted.  She denies chest pain or headache.  2. Other depression, emotional eating behaviors Jennifer Frazier started on Wellbutrin at 150 mg, and she felt it helped initially but not as much anymore.  Assessment/Plan:   1. Essential hypertension Jennifer Frazier will continue Norvasc 5 mg once daily, and we will refill for 1 month.  She will continue to work on her diet and exercise.  - amLODipine (NORVASC) 5 MG tablet; Take 1 tablet (5 mg total) by mouth daily.  Dispense: 30 tablet; Refill: 0  2. Other depression, emotional eating behaviors Jennifer Frazier agreed to increase Wellbutrin SR to 200 mg once daily, and we will refill for 1 month.  - buPROPion (WELLBUTRIN SR) 200 MG 12 hr tablet; Take 1 tablet (200 mg total) by mouth daily.  Dispense: 30 tablet; Refill: 0  3. Obesity, Current BMI 32.0 Jennifer Frazier is currently in the action stage of change. As such, her goal is to continue with weight loss efforts. She has agreed to  keeping a food journal and adhering to recommended goals of 1300-1600 calories and 120+ grams of protein daily.   Exercise goals: As is.   Behavioral modification strategies: increasing lean protein intake.  Jennifer Frazier has agreed to follow-up with our clinic in 3 weeks. She was informed of the importance of frequent follow-up visits to maximize her success with intensive lifestyle modifications for her multiple health conditions.   Objective:   Blood pressure 130/86, pulse 67, temperature 98.1 F (36.7 C), height '5\' 9"'$  (1.753 m), weight 217 lb (98.4 kg), SpO2 100 %. Body mass index is 32.05 kg/m.  General: Cooperative, alert, well developed, in no acute distress. HEENT: Conjunctivae and lids unremarkable. Cardiovascular: Regular rhythm.  Lungs: Normal work of breathing. Neurologic: No focal deficits.   Lab Results  Component Value Date   CREATININE 0.87 01/06/2021   BUN 16 01/06/2021   NA 141 01/06/2021   K 4.4 01/06/2021   CL 100 01/06/2021   CO2 26 01/06/2021   Lab Results  Component Value Date   ALT 12 01/06/2021   AST 20 01/06/2021   ALKPHOS 108 01/06/2021   BILITOT 0.5 01/06/2021   Lab Results  Component Value Date   HGBA1C 5.0 01/06/2021   HGBA1C 5.0 01/30/2020   HGBA1C 4.9 04/18/2019   HGBA1C 5.0 12/08/2018   HGBA1C 5.1 12/16/2017   Lab Results  Component Value Date   INSULIN 3.9 01/06/2021   INSULIN 4.5 01/30/2020   INSULIN 6.7 04/18/2019   INSULIN  11.0 12/08/2018   INSULIN 13.5 12/16/2017   Lab Results  Component Value Date   TSH 1.65 09/04/2020   Lab Results  Component Value Date   CHOL 150 09/04/2020   HDL 84 (A) 09/04/2020   LDLCALC 56 09/04/2020   TRIG 40 09/04/2020   Lab Results  Component Value Date   VD25OH 40.8 01/06/2021   VD25OH 59.9 04/18/2019   VD25OH 66.6 12/08/2018   Lab Results  Component Value Date   WBC 4.0 09/04/2020   HGB 14.3 09/04/2020   HCT 43 09/04/2020   MCV 85.1 11/13/2019   PLT 214 09/04/2020   No results  found for: "IRON", "TIBC", "FERRITIN"  Attestation Statements:   Reviewed by clinician on day of visit: allergies, medications, problem list, medical history, surgical history, family history, social history, and previous encounter notes.   I, Trixie Dredge, am acting as transcriptionist for Dennard Nip, MD.  I have reviewed the above documentation for accuracy and completeness, and I agree with the above. -  Dennard Nip, MD

## 2021-08-11 ENCOUNTER — Other Ambulatory Visit (INDEPENDENT_AMBULATORY_CARE_PROVIDER_SITE_OTHER): Payer: Self-pay | Admitting: Family Medicine

## 2021-08-11 DIAGNOSIS — F439 Reaction to severe stress, unspecified: Secondary | ICD-10-CM

## 2021-08-26 ENCOUNTER — Encounter (INDEPENDENT_AMBULATORY_CARE_PROVIDER_SITE_OTHER): Payer: Self-pay | Admitting: Family Medicine

## 2021-08-26 ENCOUNTER — Ambulatory Visit (INDEPENDENT_AMBULATORY_CARE_PROVIDER_SITE_OTHER): Payer: 59 | Admitting: Family Medicine

## 2021-08-26 VITALS — BP 137/89 | HR 82 | Temp 98.0°F | Ht 69.0 in | Wt 217.0 lb

## 2021-08-26 DIAGNOSIS — Z6832 Body mass index (BMI) 32.0-32.9, adult: Secondary | ICD-10-CM

## 2021-08-26 DIAGNOSIS — F3289 Other specified depressive episodes: Secondary | ICD-10-CM | POA: Diagnosis not present

## 2021-08-26 DIAGNOSIS — E559 Vitamin D deficiency, unspecified: Secondary | ICD-10-CM | POA: Diagnosis not present

## 2021-08-26 DIAGNOSIS — E8881 Metabolic syndrome: Secondary | ICD-10-CM

## 2021-08-26 DIAGNOSIS — E66813 Obesity, class 3: Secondary | ICD-10-CM

## 2021-08-26 DIAGNOSIS — I10 Essential (primary) hypertension: Secondary | ICD-10-CM

## 2021-08-26 DIAGNOSIS — E88819 Insulin resistance, unspecified: Secondary | ICD-10-CM

## 2021-08-26 DIAGNOSIS — E669 Obesity, unspecified: Secondary | ICD-10-CM

## 2021-08-26 MED ORDER — AMLODIPINE BESYLATE 5 MG PO TABS: 5.0000 mg | ORAL_TABLET | Freq: Every day | ORAL | 0 refills | Status: DC

## 2021-08-26 MED ORDER — BUPROPION HCL ER (SR) 200 MG PO TB12: 200.0000 mg | ORAL_TABLET | Freq: Every day | ORAL | 0 refills | Status: DC

## 2021-09-03 ENCOUNTER — Encounter (INDEPENDENT_AMBULATORY_CARE_PROVIDER_SITE_OTHER): Payer: Self-pay

## 2021-09-06 LAB — CMP14+EGFR
ALT: 12 IU/L (ref 0–32)
AST: 15 IU/L (ref 0–40)
Albumin/Globulin Ratio: 1.4 (ref 1.2–2.2)
Albumin: 4.7 g/dL (ref 3.9–4.9)
Alkaline Phosphatase: 92 IU/L (ref 44–121)
BUN/Creatinine Ratio: 20 (ref 9–23)
BUN: 18 mg/dL (ref 6–24)
Bilirubin Total: 0.4 mg/dL (ref 0.0–1.2)
CO2: 22 mmol/L (ref 20–29)
Calcium: 10.1 mg/dL (ref 8.7–10.2)
Chloride: 103 mmol/L (ref 96–106)
Creatinine, Ser: 0.91 mg/dL (ref 0.57–1.00)
Globulin, Total: 3.3 g/dL (ref 1.5–4.5)
Glucose: 84 mg/dL (ref 70–99)
Potassium: 3.8 mmol/L (ref 3.5–5.2)
Sodium: 141 mmol/L (ref 134–144)
Total Protein: 8 g/dL (ref 6.0–8.5)
eGFR: 78 mL/min/{1.73_m2} (ref >59)

## 2021-09-06 LAB — LIPID PANEL WITH LDL/HDL RATIO
Cholesterol, Total: 148 mg/dL (ref 100–199)
HDL: 78 mg/dL (ref >39)
LDL Chol Calc (NIH): 60 mg/dL (ref 0–99)
LDL/HDL Ratio: 0.8 ratio (ref 0.0–3.2)
Triglycerides: 46 mg/dL (ref 0–149)
VLDL Cholesterol Cal: 10 mg/dL (ref 5–40)

## 2021-09-06 LAB — INSULIN, RANDOM: INSULIN: 6.7 u[IU]/mL (ref 2.6–24.9)

## 2021-09-06 LAB — VITAMIN D 25 HYDROXY (VIT D DEFICIENCY, FRACTURES): Vit D, 25-Hydroxy: 49.8 ng/mL (ref 30.0–100.0)

## 2021-09-06 LAB — HEMOGLOBIN A1C
Est. average glucose Bld gHb Est-mCnc: 100 mg/dL
Hgb A1c MFr Bld: 5.1 % (ref 4.8–5.6)

## 2021-09-07 NOTE — Progress Notes (Unsigned)
**Note De-Identified Jennifer Frazier Obfuscation** Chief Complaint:   OBESITY Jennifer Frazier is here to discuss her progress with her obesity treatment plan along with follow-up of her obesity related diagnoses. Stanislawa is on keeping a food journal and adhering to recommended goals of 1300-1600 calories and 120+ grams of protein daily and states she is following her eating plan approximately 70% of the time. Joaquina states she is walking for 30 minutes 5 times per week, and doing strengthening 2 days per week.  Today's visit was #: 61 Starting weight: 294 lbs Starting date: 06/24/2017 Today's weight: 217 lbs Today's date: 08/26/2021 Total lbs lost to date: 77 Total lbs lost since last in-office visit: 0  Interim History: Jennifer Frazier has done well with maintaining her weight loss.  She has had increased stress and multiple doctor visits for herself, her family, and her dog.  She has not been able to meal plan as much.  Her recent dental issues have made meeting her protein goals difficult.  Subjective:   1. Vitamin D deficiency Jennifer Frazier is on vitamin D, and she is due for labs.  She has no signs of over replacement.  2. Insulin resistance Jennifer Frazier is working on her diet and exercise, and she is due for labs.  3. Essential hypertension Jennifer Frazier's blood pressure is stable on her medications.  She has been in increased pain with an infected root canal, which could affect her blood pressure.  No side effects were noted.  4. Other depression, emotional eating behaviors Jennifer Frazier is stable on Wellbutrin.  She has had a very stressful month, but she has done well with decreasing emotional eating behaviors.  Assessment/Plan:   1. Vitamin D deficiency We will check labs today.  Timisha will follow-up for routine testing of Vitamin D, at least 2-3 times per year to avoid over-replacement.  - VITAMIN D 25 Hydroxy (Vit-D Deficiency, Fractures)  2. Insulin resistance We will check labs today.  Cesily will continue to work on weight loss, exercise, and decreasing simple  carbohydrates to help decrease the risk of diabetes. Sarit agreed to follow-up with [name] as directed to closely monitor her progress.  - Insulin, random - Lipid Panel With LDL/HDL Ratio - CMP14+EGFR - Hemoglobin A1c  3. Essential hypertension We will refill amlodipine for 1 month.  Shanielle will continue working on her diet and exercise to improve blood pressure control. We will watch for signs of hypotension as she continues her lifestyle modifications.  - amLODipine (NORVASC) 5 MG tablet; Take 1 tablet (5 mg total) by mouth daily.  Dispense: 30 tablet; Refill: 0  4. Other depression, emotional eating behaviors Vala will continue Wellbutrin SR 200 mg once daily, and we will refill for 1 month.  - buPROPion (WELLBUTRIN SR) 200 MG 12 hr tablet; Take 1 tablet (200 mg total) by mouth daily.  Dispense: 30 tablet; Refill: 0  5. Obesity, Current BMI 32.1 Jennifer Frazier is currently in the action stage of change. As such, her goal is to continue with weight loss efforts. She has agreed to keeping a food journal and adhering to recommended goals of 1300-1600 calories and 120+ grams of protein daily.   Soft protein rich foods were discussed.   Exercise goals: As is.   Behavioral modification strategies: meal planning and cooking strategies.  Olina has agreed to follow-up with our clinic in 4 weeks. She was informed of the importance of frequent follow-up visits to maximize her success with intensive lifestyle modifications for her multiple health conditions.   Jennifer Frazier was informed we  would discuss her lab results at her next visit unless there is a critical issue that needs to be addressed sooner. Jennifer Frazier agreed to keep her next visit at the agreed upon time to discuss these results.  Objective:   Blood pressure 137/89, pulse 82, temperature 98 F (36.7 C), height '5\' 9"'  (1.753 m), weight 217 lb (98.4 kg), SpO2 98 %. Body mass index is 32.05 kg/m.  General: Cooperative, alert, well developed, in no acute  distress. HEENT: Conjunctivae and lids unremarkable. Cardiovascular: Regular rhythm.  Lungs: Normal work of breathing. Neurologic: No focal deficits.   Lab Results  Component Value Date   CREATININE 0.91 09/05/2021   BUN 18 09/05/2021   NA 141 09/05/2021   K 3.8 09/05/2021   CL 103 09/05/2021   CO2 22 09/05/2021   Lab Results  Component Value Date   ALT 12 09/05/2021   AST 15 09/05/2021   ALKPHOS 92 09/05/2021   BILITOT 0.4 09/05/2021   Lab Results  Component Value Date   HGBA1C 5.1 09/05/2021   HGBA1C 5.0 01/06/2021   HGBA1C 5.0 01/30/2020   HGBA1C 4.9 04/18/2019   HGBA1C 5.0 12/08/2018   Lab Results  Component Value Date   INSULIN 6.7 09/05/2021   INSULIN 3.9 01/06/2021   INSULIN 4.5 01/30/2020   INSULIN 6.7 04/18/2019   INSULIN 11.0 12/08/2018   Lab Results  Component Value Date   TSH 1.65 09/04/2020   Lab Results  Component Value Date   CHOL 148 09/05/2021   HDL 78 09/05/2021   LDLCALC 60 09/05/2021   TRIG 46 09/05/2021   Lab Results  Component Value Date   VD25OH 49.8 09/05/2021   VD25OH 40.8 01/06/2021   VD25OH 59.9 04/18/2019   Lab Results  Component Value Date   WBC 4.0 09/04/2020   HGB 14.3 09/04/2020   HCT 43 09/04/2020   MCV 85.1 11/13/2019   PLT 214 09/04/2020   No results found for: "IRON", "TIBC", "FERRITIN"  Attestation Statements:   Reviewed by clinician on day of visit: allergies, medications, problem list, medical history, surgical history, family history, social history, and previous encounter notes.   I, Trixie Dredge, am acting as transcriptionist for Dennard Nip, MD.  I have reviewed the above documentation for accuracy and completeness, and I agree with the above. -  Dennard Nip, MD

## 2021-09-10 ENCOUNTER — Other Ambulatory Visit: Payer: Self-pay | Admitting: Family Medicine

## 2021-09-10 DIAGNOSIS — Z1231 Encounter for screening mammogram for malignant neoplasm of breast: Secondary | ICD-10-CM

## 2021-09-25 ENCOUNTER — Encounter (INDEPENDENT_AMBULATORY_CARE_PROVIDER_SITE_OTHER): Payer: Self-pay | Admitting: Family Medicine

## 2021-09-25 ENCOUNTER — Ambulatory Visit (INDEPENDENT_AMBULATORY_CARE_PROVIDER_SITE_OTHER): Payer: 59 | Admitting: Family Medicine

## 2021-09-25 VITALS — BP 121/85 | HR 64 | Temp 97.9°F | Ht 69.0 in | Wt 217.0 lb

## 2021-09-25 DIAGNOSIS — F3289 Other specified depressive episodes: Secondary | ICD-10-CM | POA: Diagnosis not present

## 2021-09-25 DIAGNOSIS — E669 Obesity, unspecified: Secondary | ICD-10-CM | POA: Diagnosis not present

## 2021-09-25 DIAGNOSIS — Z6832 Body mass index (BMI) 32.0-32.9, adult: Secondary | ICD-10-CM

## 2021-09-25 DIAGNOSIS — I10 Essential (primary) hypertension: Secondary | ICD-10-CM

## 2021-09-25 MED ORDER — BUPROPION HCL ER (XL) 300 MG PO TB24: 300.0000 mg | ORAL_TABLET | Freq: Every day | ORAL | 0 refills | Status: DC

## 2021-09-25 MED ORDER — AMLODIPINE BESYLATE 10 MG PO TABS: 10.0000 mg | ORAL_TABLET | Freq: Every day | ORAL | 0 refills | Status: DC

## 2021-10-01 NOTE — Progress Notes (Signed)
Chief Complaint:   OBESITY Jennifer Frazier is here to discuss her progress with her obesity treatment plan along with follow-up of her obesity related diagnoses. Jennifer Frazier is on keeping a food journal and adhering to recommended goals of 1300-1600 calories and 120+ grams of protein daily and states she is following her eating plan approximately 95% of the time. Jennifer Frazier states she is walking for 30 minutes 5 times per week, and doing strengthening for 30 minutes 2 times per week.  Today's visit was #: 78 Starting weight: 294 lbs Starting date: 06/24/2017 Today's weight: 217 lbs Today's date: 09/25/2021 Total lbs lost to date: 77 Total lbs lost since last in-office visit: 0  Interim History: Jennifer Frazier continues to do well with maintaining her weight loss. She is mindful of her protein. She os doing well with tracking and exercise, but her weight loss is stalling.   Subjective:   1. Essential hypertension Jennifer Frazier's amlodipine was increased to 10 mg and her blood pressure is controlled today.   2. Other depression, emotional eating behaviors Jennifer Frazier's Wellbutrin changed from 200 mg SR to 300 mg XL. She notes feeling a bit different.   Assessment/Plan:   1. Essential hypertension Jennifer Frazier will continue amlodipine 10 mg once daily, and we will refill for 1 month.  - amLODipine (NORVASC) 10 MG tablet; Take 1 tablet (10 mg total) by mouth daily.  Dispense: 30 tablet; Refill: 0  2. Other depression, emotional eating behaviors Jennifer Frazier will continue Wellbutrin XL as is, and we will refill for 1 month.  - buPROPion (WELLBUTRIN XL) 300 MG 24 hr tablet; Take 1 tablet (300 mg total) by mouth daily.  Dispense: 30 tablet; Refill: 0  3. Obesity, Current BMI 32.1 Jennifer Frazier is currently in the action stage of change. As such, her goal is to continue with weight loss efforts. She has agreed to keeping a food journal and adhering to recommended goals of 1300-1600 calories and 120+ grams of protein daily.   We will plan to  recheck her RMR (fasting IC) at her next visit.   Exercise goals: As is.   Behavioral modification strategies: increasing lean protein intake.  Jennifer Frazier has agreed to follow-up with our clinic in 4 weeks. She was informed of the importance of frequent follow-up visits to maximize her success with intensive lifestyle modifications for her multiple health conditions.   Objective:   Blood pressure 121/85, pulse 64, temperature 97.9 F (36.6 C), height '5\' 9"'$  (1.753 m), weight 217 lb (98.4 kg), SpO2 99 %. Body mass index is 32.05 kg/m.  General: Cooperative, alert, well developed, in no acute distress. HEENT: Conjunctivae and lids unremarkable. Cardiovascular: Regular rhythm.  Lungs: Normal work of breathing. Neurologic: No focal deficits.   Lab Results  Component Value Date   CREATININE 0.91 09/05/2021   BUN 18 09/05/2021   NA 141 09/05/2021   K 3.8 09/05/2021   CL 103 09/05/2021   CO2 22 09/05/2021   Lab Results  Component Value Date   ALT 12 09/05/2021   AST 15 09/05/2021   ALKPHOS 92 09/05/2021   BILITOT 0.4 09/05/2021   Lab Results  Component Value Date   HGBA1C 5.1 09/05/2021   HGBA1C 5.0 01/06/2021   HGBA1C 5.0 01/30/2020   HGBA1C 4.9 04/18/2019   HGBA1C 5.0 12/08/2018   Lab Results  Component Value Date   INSULIN 6.7 09/05/2021   INSULIN 3.9 01/06/2021   INSULIN 4.5 01/30/2020   INSULIN 6.7 04/18/2019   INSULIN 11.0 12/08/2018   Lab  Results  Component Value Date   TSH 1.65 09/04/2020   Lab Results  Component Value Date   CHOL 148 09/05/2021   HDL 78 09/05/2021   LDLCALC 60 09/05/2021   TRIG 46 09/05/2021   Lab Results  Component Value Date   VD25OH 49.8 09/05/2021   VD25OH 40.8 01/06/2021   VD25OH 59.9 04/18/2019   Lab Results  Component Value Date   WBC 4.0 09/04/2020   HGB 14.3 09/04/2020   HCT 43 09/04/2020   MCV 85.1 11/13/2019   PLT 214 09/04/2020   No results found for: "IRON", "TIBC", "FERRITIN"  Attestation Statements:    Reviewed by clinician on day of visit: allergies, medications, problem list, medical history, surgical history, family history, social history, and previous encounter notes.   I, Jennifer Frazier, am acting as transcriptionist for Dennard Nip, MD.  I have reviewed the above documentation for accuracy and completeness, and I agree with the above. -  Dennard Nip, MD

## 2021-10-02 ENCOUNTER — Telehealth (INDEPENDENT_AMBULATORY_CARE_PROVIDER_SITE_OTHER): Payer: Self-pay | Admitting: Family Medicine

## 2021-10-02 NOTE — Telephone Encounter (Signed)
Patient went to the doctor for tendintis and they prescribe her Methylprednisolone '4mg'$   She wants know if there is anything else can take or something Dr Leafy Ro can give her  to crave the cravings.

## 2021-10-17 ENCOUNTER — Ambulatory Visit
Admission: RE | Admit: 2021-10-17 | Discharge: 2021-10-17 | Disposition: A | Discharge status: Home / Self Care | Payer: 59 | Source: Ambulatory Visit | Attending: Family Medicine | Admitting: Family Medicine

## 2021-10-17 DIAGNOSIS — Z1231 Encounter for screening mammogram for malignant neoplasm of breast: Secondary | ICD-10-CM

## 2021-10-27 ENCOUNTER — Ambulatory Visit (INDEPENDENT_AMBULATORY_CARE_PROVIDER_SITE_OTHER): Payer: 59 | Admitting: Family Medicine

## 2021-10-27 VITALS — BP 122/85 | HR 84 | Temp 98.3°F | Ht 69.0 in | Wt 220.0 lb

## 2021-10-27 DIAGNOSIS — E669 Obesity, unspecified: Secondary | ICD-10-CM | POA: Diagnosis not present

## 2021-10-27 DIAGNOSIS — R0602 Shortness of breath: Secondary | ICD-10-CM

## 2021-10-27 DIAGNOSIS — Z6832 Body mass index (BMI) 32.0-32.9, adult: Secondary | ICD-10-CM

## 2021-10-27 DIAGNOSIS — F3289 Other specified depressive episodes: Secondary | ICD-10-CM | POA: Diagnosis not present

## 2021-10-27 DIAGNOSIS — I10 Essential (primary) hypertension: Secondary | ICD-10-CM | POA: Diagnosis not present

## 2021-10-27 MED ORDER — AMLODIPINE BESYLATE 10 MG PO TABS: 10.0000 mg | ORAL_TABLET | Freq: Every day | ORAL | 0 refills | Status: DC

## 2021-10-28 NOTE — Progress Notes (Signed)
Chief Complaint:   OBESITY Jennifer Frazier is here to discuss her progress with her obesity treatment plan along with follow-up of her obesity related diagnoses. Jennifer Frazier is on keeping a food journal and adhering to recommended goals of 1300-1600 calories and 120+ grams of protein daily and states she is following her eating plan approximately 95% of the time. Jennifer Frazier states she is walking and doing strengthening for 30 minutes 2-5 times per week.  Today's visit was #: 11 Starting weight: 294 lbs Starting date: 06/24/2017 Today's weight: 220 lbs Today's date: 10/27/2021 Total lbs lost to date: 74 Total lbs lost since last in-office visit: 0  Interim History: Jennifer Frazier has done well with weight loss overall.  Her hunger is okay.  She reports she just feels more tired lately, and not the same effort lately as she is just mentally fatigued and stressed lately.  She is feeling like she has too much to do, and she feels she is doing well with journaling overall.  Subjective:   1. Essential hypertension Jennifer Frazier is taking amlodipine without side effects.  Her blood pressure is near goal.  2. SOBOE (shortness of breath on exertion) We discussed today's RMR at 1987.  Discussed that she has varied quite a bit in the past, but she remained in better range currently.  3. Other depression, emotional eating behaviors Jennifer Frazier stop taking Wellbutrin as she did not feel it was helping about 3 weeks ago.  She felt like it was only initially that she had more energy, but that feeling went away quick.  Assessment/Plan:   1. Essential hypertension We will refill amlodipine 10 mg once weekly for 1 month.  Jennifer Frazier will continue with her diet and exercise, and we will continue to follow.  - amLODipine (NORVASC) 10 MG tablet; Take 1 tablet (10 mg total) by mouth daily.  Dispense: 30 tablet; Refill: 0  2. SOBOE (shortness of breath on exertion) Jennifer Frazier will continue with her diet and exercise.  Encourage careful journaling and  avoiding hidden calories.  3. Other depression, emotional eating behaviors We discussed options for other medications, and she would like to wait until her next visit to discuss.  Jennifer Frazier agreed to discontinue Wellbutrin.  4. Obesity, Current BMI 32.6 Jennifer Frazier is currently in the action stage of change. As such, her goal is to continue with weight loss efforts. She has agreed to keeping a food journal and adhering to recommended goals of 1500 calories and 100+ grams of protein daily.   Jennifer Frazier was encouraged to increase her protein intake and decrease simple carbohydrates.  Encouraging careful journaling and watch out for hidden calories.  Exercise goals: As is.   Behavioral modification strategies: increasing lean protein intake, decreasing simple carbohydrates, increasing water intake, meal planning and cooking strategies, emotional eating strategies, and keeping a strict food journal.  Jennifer Frazier has agreed to follow-up with our clinic in 4 weeks. She was informed of the importance of frequent follow-up visits to maximize her success with intensive lifestyle modifications for her multiple health conditions.   Objective:   Blood pressure 122/85, pulse 84, temperature 98.3 F (36.8 C), height '5\' 9"'$  (1.753 m), weight 220 lb (99.8 kg), SpO2 100 %. Body mass index is 32.49 kg/m.  General: Cooperative, alert, well developed, in no acute distress. HEENT: Conjunctivae and lids unremarkable. Cardiovascular: Regular rhythm.  Lungs: Normal work of breathing. Neurologic: No focal deficits.   Lab Results  Component Value Date   CREATININE 0.91 09/05/2021   BUN 18 09/05/2021  NA 141 09/05/2021   K 3.8 09/05/2021   CL 103 09/05/2021   CO2 22 09/05/2021   Lab Results  Component Value Date   ALT 12 09/05/2021   AST 15 09/05/2021   ALKPHOS 92 09/05/2021   BILITOT 0.4 09/05/2021   Lab Results  Component Value Date   HGBA1C 5.1 09/05/2021   HGBA1C 5.0 01/06/2021   HGBA1C 5.0 01/30/2020   HGBA1C  4.9 04/18/2019   HGBA1C 5.0 12/08/2018   Lab Results  Component Value Date   INSULIN 6.7 09/05/2021   INSULIN 3.9 01/06/2021   INSULIN 4.5 01/30/2020   INSULIN 6.7 04/18/2019   INSULIN 11.0 12/08/2018   Lab Results  Component Value Date   TSH 1.65 09/04/2020   Lab Results  Component Value Date   CHOL 148 09/05/2021   HDL 78 09/05/2021   LDLCALC 60 09/05/2021   TRIG 46 09/05/2021   Lab Results  Component Value Date   VD25OH 49.8 09/05/2021   VD25OH 40.8 01/06/2021   VD25OH 59.9 04/18/2019   Lab Results  Component Value Date   WBC 4.0 09/04/2020   HGB 14.3 09/04/2020   HCT 43 09/04/2020   MCV 85.1 11/13/2019   PLT 214 09/04/2020   No results found for: "IRON", "TIBC", "FERRITIN"  Attestation Statements:   Reviewed by clinician on day of visit: allergies, medications, problem list, medical history, surgical history, family history, social history, and previous encounter notes.   I, Trixie Dredge, am acting as transcriptionist for Dennard Nip, MD.  I have reviewed the above documentation for accuracy and completeness, and I agree with the above. -  Dennard Nip, MD

## 2021-11-25 ENCOUNTER — Encounter (INDEPENDENT_AMBULATORY_CARE_PROVIDER_SITE_OTHER): Payer: Self-pay | Admitting: Family Medicine

## 2021-11-25 ENCOUNTER — Ambulatory Visit (INDEPENDENT_AMBULATORY_CARE_PROVIDER_SITE_OTHER): Payer: 59 | Admitting: Family Medicine

## 2021-11-25 VITALS — BP 121/79 | HR 68 | Temp 97.9°F | Ht 69.0 in | Wt 219.0 lb

## 2021-11-25 DIAGNOSIS — Z6832 Body mass index (BMI) 32.0-32.9, adult: Secondary | ICD-10-CM | POA: Diagnosis not present

## 2021-11-25 DIAGNOSIS — G479 Sleep disorder, unspecified: Secondary | ICD-10-CM | POA: Diagnosis not present

## 2021-11-25 DIAGNOSIS — I10 Essential (primary) hypertension: Secondary | ICD-10-CM

## 2021-11-25 DIAGNOSIS — E669 Obesity, unspecified: Secondary | ICD-10-CM

## 2021-11-25 MED ORDER — AMLODIPINE BESYLATE 10 MG PO TABS: 10.0000 mg | ORAL_TABLET | Freq: Every day | ORAL | 0 refills | Status: DC

## 2021-12-02 NOTE — Progress Notes (Unsigned)
Chief Complaint:   OBESITY Jennifer Frazier is here to discuss her progress with her obesity treatment plan along with follow-up of her obesity related diagnoses. Jennifer Frazier is on {MWMwtlossportion/plan2:23431} and states she is following her eating plan approximately ***% of the time. Jennifer Frazier states she is *** *** minutes *** times per week.  Today's visit was #: *** Starting weight: *** Starting date: *** Today's weight: *** Today's date: 11/25/2021 Total lbs lost to date: *** Total lbs lost since last in-office visit: ***  Interim History: ***  Subjective:   1. Essential hypertension ***  2. Sleep disorder ***  Assessment/Plan:   1. Essential hypertension *** - amLODipine (NORVASC) 10 MG tablet; Take 1 tablet (10 mg total) by mouth daily.  Dispense: 30 tablet; Refill: 0  2. Sleep disorder ***  3. Obesity, Current BMI 32.4 Jennifer Frazier is currently in the action stage of change. As such, her goal is to continue with weight loss efforts. She has agreed to keeping a food journal and adhering to recommended goals of 1500 calories and 100+ grams of protein daily.   Protein handout was given to help with vegetarian protein equivalents. [email or web address] for other ideas.   Exercise goals: As is.   Behavioral modification strategies: increasing lean protein intake, decreasing simple carbohydrates, planning for success, and keeping a strict food journal.  Jennifer Frazier has agreed to follow-up with our clinic in 3 to 4 weeks. She was informed of the importance of frequent follow-up visits to maximize her success with intensive lifestyle modifications for her multiple health conditions.   Objective:   Blood pressure 121/79, pulse 68, temperature 97.9 F (36.6 C), height '5\' 9"'$  (1.753 m), weight 219 lb (99.3 kg), SpO2 99 %. Body mass index is 32.34 kg/m.  General: Cooperative, alert, well developed, in no acute distress. HEENT: Conjunctivae and lids unremarkable. Cardiovascular: Regular rhythm.   Lungs: Normal work of breathing. Neurologic: No focal deficits.   Lab Results  Component Value Date   CREATININE 0.91 09/05/2021   BUN 18 09/05/2021   NA 141 09/05/2021   K 3.8 09/05/2021   CL 103 09/05/2021   CO2 22 09/05/2021   Lab Results  Component Value Date   ALT 12 09/05/2021   AST 15 09/05/2021   ALKPHOS 92 09/05/2021   BILITOT 0.4 09/05/2021   Lab Results  Component Value Date   HGBA1C 5.1 09/05/2021   HGBA1C 5.0 01/06/2021   HGBA1C 5.0 01/30/2020   HGBA1C 4.9 04/18/2019   HGBA1C 5.0 12/08/2018   Lab Results  Component Value Date   INSULIN 6.7 09/05/2021   INSULIN 3.9 01/06/2021   INSULIN 4.5 01/30/2020   INSULIN 6.7 04/18/2019   INSULIN 11.0 12/08/2018   Lab Results  Component Value Date   TSH 1.65 09/04/2020   Lab Results  Component Value Date   CHOL 148 09/05/2021   HDL 78 09/05/2021   LDLCALC 60 09/05/2021   TRIG 46 09/05/2021   Lab Results  Component Value Date   VD25OH 49.8 09/05/2021   VD25OH 40.8 01/06/2021   VD25OH 59.9 04/18/2019   Lab Results  Component Value Date   WBC 4.0 09/04/2020   HGB 14.3 09/04/2020   HCT 43 09/04/2020   MCV 85.1 11/13/2019   PLT 214 09/04/2020   No results found for: "IRON", "TIBC", "FERRITIN"  Attestation Statements:   Reviewed by clinician on day of visit: allergies, medications, problem list, medical history, surgical history, family history, social history, and previous encounter notes.   Clide Dales  Hassell Done, am acting as Location manager for Jennifer Nip, MD.  I have reviewed the above documentation for accuracy and completeness, and I agree with the above. -  ***

## 2021-12-30 ENCOUNTER — Encounter (INDEPENDENT_AMBULATORY_CARE_PROVIDER_SITE_OTHER): Payer: Self-pay | Admitting: Family Medicine

## 2021-12-30 ENCOUNTER — Ambulatory Visit (INDEPENDENT_AMBULATORY_CARE_PROVIDER_SITE_OTHER): Payer: 59 | Admitting: Family Medicine

## 2021-12-30 VITALS — BP 131/85 | HR 74 | Temp 98.2°F | Ht 69.0 in | Wt 216.2 lb

## 2021-12-30 DIAGNOSIS — E669 Obesity, unspecified: Secondary | ICD-10-CM | POA: Diagnosis not present

## 2021-12-30 DIAGNOSIS — I10 Essential (primary) hypertension: Secondary | ICD-10-CM

## 2021-12-30 DIAGNOSIS — E88819 Insulin resistance, unspecified: Secondary | ICD-10-CM | POA: Diagnosis not present

## 2021-12-30 DIAGNOSIS — Z6841 Body Mass Index (BMI) 40.0 and over, adult: Secondary | ICD-10-CM

## 2021-12-30 DIAGNOSIS — Z6831 Body mass index (BMI) 31.0-31.9, adult: Secondary | ICD-10-CM | POA: Diagnosis not present

## 2021-12-30 MED ORDER — AMLODIPINE BESYLATE 10 MG PO TABS: 10.0000 mg | ORAL_TABLET | Freq: Every day | ORAL | 0 refills | Status: DC

## 2022-01-12 NOTE — Progress Notes (Signed)
Chief Complaint:   OBESITY Jennifer Frazier is here to discuss her progress with her obesity treatment plan along with follow-up of her obesity related diagnoses. Jennifer Frazier is on keeping a food journal and adhering to recommended goals of 1500 calories and 100+ grams of protein and states she is following her eating plan approximately 95% of the time. Jennifer Frazier states she is doing strength training for 30 minutes 5 times per week.  Today's visit was #: 52 Starting weight: 294 lbs Starting date: 06/24/2017 Today's weight: 216 lbs Today's date: 12/30/2021 Total lbs lost to date: 83 Total lbs lost since last in-office visit: 3  Interim History: Jennifer Frazier continues to work on [employer], [employer] and exercise.  She is doing well with meeting her calorie and protein goals, and she has increased her water intake.  Subjective:   1. Insulin resistance Jennifer Frazier is doing well with decreasing simple carbohydrates, and her A1c and fasting insulin has improved.  She has no signs of hypoglycemia.  2. Essential hypertension Jennifer Frazier's blood pressure is stable on her medications with no side effects noted.  Assessment/Plan:   1. Insulin resistance Jennifer Frazier will continue to work on decreasing simple carbohydrates in her diet.  2. Essential hypertension Jennifer Frazier will continue amlodipine 10 mg daily, and we will refill for 1 month.  - amLODipine (NORVASC) 10 MG tablet; Take 1 tablet (10 mg total) by mouth daily.  Dispense: 30 tablet; Refill: 0  3. Obesity, Current BMI 31.9 Jennifer Frazier is currently in the action stage of change. As such, her goal is to continue with weight loss efforts. She has agreed to keeping a food journal and adhering to recommended goals of 1500 calories and 100+ grams of protein daily.   Exercise goals: As is.   Behavioral modification strategies: increasing lean protein intake, meal planning and cooking strategies, and holiday eating strategies .  Jennifer Frazier has agreed to follow-up with our clinic in 4 weeks. She was  informed of the importance of frequent follow-up visits to maximize her success with intensive lifestyle modifications for her multiple health conditions.   Objective:   Blood pressure 131/85, pulse 74, temperature 98.2 F (36.8 C), height '5\' 9"'$  (1.753 m), weight 216 lb 3.2 oz (98.1 kg), SpO2 99 %. Body mass index is 31.93 kg/m.  General: Cooperative, alert, well developed, in no acute distress. HEENT: Conjunctivae and lids unremarkable. Cardiovascular: Regular rhythm.  Lungs: Normal work of breathing. Neurologic: No focal deficits.   Lab Results  Component Value Date   CREATININE 0.91 09/05/2021   BUN 18 09/05/2021   NA 141 09/05/2021   K 3.8 09/05/2021   CL 103 09/05/2021   CO2 22 09/05/2021   Lab Results  Component Value Date   ALT 12 09/05/2021   AST 15 09/05/2021   ALKPHOS 92 09/05/2021   BILITOT 0.4 09/05/2021   Lab Results  Component Value Date   HGBA1C 5.1 09/05/2021   HGBA1C 5.0 01/06/2021   HGBA1C 5.0 01/30/2020   HGBA1C 4.9 04/18/2019   HGBA1C 5.0 12/08/2018   Lab Results  Component Value Date   INSULIN 6.7 09/05/2021   INSULIN 3.9 01/06/2021   INSULIN 4.5 01/30/2020   INSULIN 6.7 04/18/2019   INSULIN 11.0 12/08/2018   Lab Results  Component Value Date   TSH 1.65 09/04/2020   Lab Results  Component Value Date   CHOL 148 09/05/2021   HDL 78 09/05/2021   LDLCALC 60 09/05/2021   TRIG 46 09/05/2021   Lab Results  Component Value Date  VD25OH 49.8 09/05/2021   VD25OH 40.8 01/06/2021   VD25OH 59.9 04/18/2019   Lab Results  Component Value Date   WBC 4.0 09/04/2020   HGB 14.3 09/04/2020   HCT 43 09/04/2020   MCV 85.1 11/13/2019   PLT 214 09/04/2020   No results found for: "IRON", "TIBC", "FERRITIN"  Attestation Statements:   Reviewed by clinician on day of visit: allergies, medications, problem list, medical history, surgical history, family history, social history, and previous encounter notes.   I, Trixie Dredge, am acting as  transcriptionist for Dennard Nip, MD.  I have reviewed the above documentation for accuracy and completeness, and I agree with the above. -  Dennard Nip, MD

## 2022-02-04 ENCOUNTER — Ambulatory Visit (INDEPENDENT_AMBULATORY_CARE_PROVIDER_SITE_OTHER): Payer: 59 | Admitting: Family Medicine

## 2022-02-04 ENCOUNTER — Encounter (INDEPENDENT_AMBULATORY_CARE_PROVIDER_SITE_OTHER): Payer: Self-pay | Admitting: Family Medicine

## 2022-02-04 VITALS — BP 119/79 | HR 77 | Temp 98.0°F | Ht 69.0 in | Wt 219.0 lb

## 2022-02-04 DIAGNOSIS — Z6832 Body mass index (BMI) 32.0-32.9, adult: Secondary | ICD-10-CM | POA: Diagnosis not present

## 2022-02-04 DIAGNOSIS — I10 Essential (primary) hypertension: Secondary | ICD-10-CM | POA: Diagnosis not present

## 2022-02-04 DIAGNOSIS — E669 Obesity, unspecified: Secondary | ICD-10-CM | POA: Diagnosis not present

## 2022-02-04 MED ORDER — AMLODIPINE BESYLATE 10 MG PO TABS: 10.0000 mg | ORAL_TABLET | Freq: Every day | ORAL | 0 refills | Status: DC

## 2022-02-16 NOTE — Progress Notes (Signed)
Chief Complaint:   OBESITY Jennifer Frazier is here to discuss her progress with her obesity treatment plan along with follow-up of her obesity related diagnoses. Jennifer Frazier is on keeping a food journal and adhering to recommended goals of 1500 calories and 100+ grams of protein daily and states she is following her eating plan approximately 90% of the time. Jennifer Frazier states she is walking for 30 minutes 5 times per week.  Today's visit was #: 62 Starting weight: 294 lbs Starting date: 06/24/2017 Today's weight: 219 lbs Today's date: 02/04/2022 Total lbs lost to date: 75 Total lbs lost since last in-office visit: 0  Interim History: Jennifer Frazier did some celebration eating over the holidays, but she wants to work on weight loss again. She is frustrated with how difficult weight loss can be. She would like to discuss anti-obesity medications.   Subjective:   1. Essential hypertension Zani's blood pressure is stable on her medications, and she is working on her weight loss. She denies chest pain or headache.   Assessment/Plan:   1. Essential hypertension Jennifer Frazier will continue her medications, diet, and exercise. We will refill amlodipine for 1 month.   - amLODipine (NORVASC) 10 MG tablet; Take 1 tablet (10 mg total) by mouth daily.  Dispense: 30 tablet; Refill: 0  2. Obesity, Current BMI 32.5 We discussed various medication options to help Jennifer Frazier with her weight loss efforts and we both agreed to start Jennifer Frazier at 0.25 mg once weekly, with no refills. Patient was informed of mechanism of action and how to take the medication.  Jennifer Frazier is currently in the action stage of change. As such, her goal is to continue with weight loss efforts. She has agreed to keeping a food journal and adhering to recommended goals of 1200-1500 calories and 90+ grams of protein daily.   Exercise goals: As is.   Behavioral modification strategies: increasing lean protein intake.  Jennifer Frazier has agreed to follow-up with our clinic in 4  weeks. She was informed of the importance of frequent follow-up visits to maximize her success with intensive lifestyle modifications for her multiple health conditions.   Objective:   Blood pressure 119/79, pulse 77, temperature 98 F (36.7 C), height '5\' 9"'$  (1.753 m), weight 219 lb (99.3 kg), SpO2 96 %. Body mass index is 32.34 kg/m.  General: Cooperative, alert, well developed, in no acute distress. HEENT: Conjunctivae and lids unremarkable. Cardiovascular: Regular rhythm.  Lungs: Normal work of breathing. Neurologic: No focal deficits.   Lab Results  Component Value Date   CREATININE 0.91 09/05/2021   BUN 18 09/05/2021   NA 141 09/05/2021   K 3.8 09/05/2021   CL 103 09/05/2021   CO2 22 09/05/2021   Lab Results  Component Value Date   ALT 12 09/05/2021   AST 15 09/05/2021   ALKPHOS 92 09/05/2021   BILITOT 0.4 09/05/2021   Lab Results  Component Value Date   HGBA1C 5.1 09/05/2021   HGBA1C 5.0 01/06/2021   HGBA1C 5.0 01/30/2020   HGBA1C 4.9 04/18/2019   HGBA1C 5.0 12/08/2018   Lab Results  Component Value Date   INSULIN 6.7 09/05/2021   INSULIN 3.9 01/06/2021   INSULIN 4.5 01/30/2020   INSULIN 6.7 04/18/2019   INSULIN 11.0 12/08/2018   Lab Results  Component Value Date   TSH 1.65 09/04/2020   Lab Results  Component Value Date   CHOL 148 09/05/2021   HDL 78 09/05/2021   LDLCALC 60 09/05/2021   TRIG 46 09/05/2021   Lab  Results  Component Value Date   VD25OH 49.8 09/05/2021   VD25OH 40.8 01/06/2021   VD25OH 59.9 04/18/2019   Lab Results  Component Value Date   WBC 4.0 09/04/2020   HGB 14.3 09/04/2020   HCT 43 09/04/2020   MCV 85.1 11/13/2019   PLT 214 09/04/2020   No results found for: "IRON", "TIBC", "FERRITIN"  Attestation Statements:   Reviewed by clinician on day of visit: allergies, medications, problem list, medical history, surgical history, family history, social history, and previous encounter notes.   I, Trixie Dredge, am acting as  transcriptionist for Dennard Nip, MD.  I have reviewed the above documentation for accuracy and completeness, and I agree with the above. -  Dennard Nip, MD

## 2022-02-17 MED ORDER — WEGOVY 0.25 MG/0.5ML ~~LOC~~ SOAJ: 0.2500 mg | SUBCUTANEOUS | 0 refills | Status: DC

## 2022-02-19 ENCOUNTER — Telehealth (INDEPENDENT_AMBULATORY_CARE_PROVIDER_SITE_OTHER): Payer: Self-pay | Admitting: Family Medicine

## 2022-02-19 NOTE — Telephone Encounter (Signed)
Alleya Lindeman (Key: [id number])  [facility] is reviewing your PA request. Typically an electronic response will be received within 24-72 hours. To check for an update later, open this request from your dashboard.  You may close this dialog and return to your dashboard to perform other tasks.

## 2022-02-19 NOTE — Telephone Encounter (Signed)
  On behalf of [employer], [place] is responsible for reviewing pharmacy services provided to [facility]. We received a request from your prescriber for coverage of Wegovy Inj 0.'25mg'$ .  We reviewed all of the information you and/or your doctor sent to [name] and sent the information to an appropriate physician specialist if needed. Unfortunately, we must deny coverage for [facility].  Why was my request denied?  This request was denied because you did not meet the following requirements: The requested medication and/or diagnosis are not a covered benefit and are excluded from coverage in accordance with the terms and conditions of your plan benefit. Therefore, this request has been administratively denied. How can I obtain the material(s) used to review this request? You may request, free of charge, a copy of the drug coverage policy, actual benefit provision, guideline, protocol or other information that factored into the decision, including the diagnosis code and the treatment code and their corresponding meanings, by calling us at [phone], or by writing to the address below: [facility] [address] [place] [place] [address] Please note that this decision only affects whether your prescription plan will pay for this medication. Only you and your prescriber can decide what is best for you and your treatment. You may still buy this medication (at full cost) at your local pharmacy.

## 2022-03-04 ENCOUNTER — Ambulatory Visit (INDEPENDENT_AMBULATORY_CARE_PROVIDER_SITE_OTHER): Payer: 59 | Admitting: Family Medicine

## 2022-03-04 ENCOUNTER — Encounter (INDEPENDENT_AMBULATORY_CARE_PROVIDER_SITE_OTHER): Payer: Self-pay | Admitting: Family Medicine

## 2022-03-04 VITALS — BP 134/85 | HR 77 | Temp 97.8°F | Ht 69.0 in | Wt 220.0 lb

## 2022-03-04 DIAGNOSIS — E669 Obesity, unspecified: Secondary | ICD-10-CM

## 2022-03-04 DIAGNOSIS — I10 Essential (primary) hypertension: Secondary | ICD-10-CM | POA: Diagnosis not present

## 2022-03-04 DIAGNOSIS — Z6832 Body mass index (BMI) 32.0-32.9, adult: Secondary | ICD-10-CM

## 2022-03-04 MED ORDER — AMLODIPINE BESYLATE 10 MG PO TABS: 10.0000 mg | ORAL_TABLET | Freq: Every day | ORAL | 0 refills | Status: DC

## 2022-03-18 NOTE — Progress Notes (Signed)
Chief Complaint:   OBESITY Jennifer Frazier is here to discuss her progress with her obesity treatment plan along with follow-up of her obesity related diagnoses. Jennifer Frazier is on keeping a food journal and adhering to recommended goals of 1200-1500 calories and 90+ grams of protein and states she is following her eating plan approximately 100% of the time. Jennifer Frazier states she is on the treadmill and lifting weights for 30 minutes 5 times per week.  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 She continues to gain weight and she is frustrated at how difficult continued weight loss has been. She is very busy caring for her elderly mother.   Subjective:   1. Essential hypertension Jennifer Frazier's blood pressure is stable on her medications. She denies lightheadedness or falls.   Assessment/Plan:   1. Essential hypertension Jennifer Frazier will continue amlodipine, and we will refill for 90 days.   - amLODipine (NORVASC) 10 MG tablet; Take 1 tablet (10 mg total) by mouth daily.  Dispense: 90 tablet; Refill: 0  2. BMI 32.0-32.9,adult  3. Obesity, Beginning BMI 43.42 Jennifer Frazier is currently in the action stage of change. As such, her goal is to continue with weight loss efforts. She has agreed to keeping a food journal and adhering to recommended goals of 1200-1500 calories and 90+ grams of protein daily.   Exercise goals: As is. Increase strengthening exercises.  Behavioral modification strategies: increasing lean protein intake.  Jennifer Frazier has agreed to follow-up with our clinic in 8 weeks. She was informed of the importance of frequent follow-up visits to maximize her success with intensive lifestyle modifications for her multiple health conditions.   Objective:   Blood  pressure 134/85, pulse 77, temperature 97.8 F (36.6 C), height 5' 9"$  (1.753 m), weight 220 lb (99.8 kg), SpO2 98 %. Body mass index is 32.49 kg/m.  General: Cooperative, alert, well developed, in no acute distress. HEENT: Conjunctivae and lids unremarkable. Cardiovascular: Regular rhythm.  Lungs: Normal work of breathing. Neurologic: No focal deficits.   Lab Results  Component Value Date   CREATININE 0.91 09/05/2021   BUN 18 09/05/2021   NA 141 09/05/2021   K 3.8 09/05/2021   CL 103 09/05/2021   CO2 22 09/05/2021   Lab Results  Component Value Date   ALT 12 09/05/2021   AST 15 09/05/2021   ALKPHOS 92 09/05/2021   BILITOT 0.4 09/05/2021   Lab Results  Component Value Date   HGBA1C 5.1 09/05/2021   HGBA1C 5.0 01/06/2021   HGBA1C 5.0 01/30/2020   HGBA1C 4.9 04/18/2019   HGBA1C 5.0 12/08/2018   Lab Results  Component Value Date   INSULIN 6.7 09/05/2021   INSULIN 3.9 01/06/2021   INSULIN 4.5 01/30/2020   INSULIN 6.7 04/18/2019   INSULIN 11.0 12/08/2018   Lab Results  Component Value Date   TSH 1.65 09/04/2020   Lab Results  Component Value Date   CHOL 148 09/05/2021   HDL 78 09/05/2021   LDLCALC 60 09/05/2021   TRIG 46 09/05/2021   Lab Results  Component Value Date   VD25OH 49.8 09/05/2021   VD25OH 40.8 01/06/2021   VD25OH 59.9 04/18/2019   Lab Results  Component Value Date   WBC 4.0 09/04/2020   HGB 14.3 09/04/2020   HCT  43 09/04/2020   MCV 85.1 11/13/2019   PLT 214 09/04/2020   No results found for: "IRON", "TIBC", "FERRITIN"  Attestation Statements:   Reviewed by clinician on day of visit: allergies, medications, problem list, medical history, surgical history, family history, social history, and previous encounter notes.  Time spent on visit including pre-visit chart review and post-visit care and charting was 30 minutes.   I, Trixie Dredge, am acting as transcriptionist for Dennard Nip, MD.  I have reviewed the above documentation for  accuracy and completeness, and I agree with the above. -  Dennard Nip, MD

## 2022-04-27 ENCOUNTER — Encounter (INDEPENDENT_AMBULATORY_CARE_PROVIDER_SITE_OTHER): Payer: Self-pay | Admitting: *Deleted

## 2022-04-29 ENCOUNTER — Encounter (INDEPENDENT_AMBULATORY_CARE_PROVIDER_SITE_OTHER): Payer: Self-pay | Admitting: Family Medicine

## 2022-04-29 ENCOUNTER — Ambulatory Visit (INDEPENDENT_AMBULATORY_CARE_PROVIDER_SITE_OTHER): Payer: 59 | Admitting: Family Medicine

## 2022-04-29 VITALS — BP 117/79 | HR 70 | Temp 97.6°F | Ht 69.0 in | Wt 223.0 lb

## 2022-04-29 DIAGNOSIS — Z6833 Body mass index (BMI) 33.0-33.9, adult: Secondary | ICD-10-CM | POA: Diagnosis not present

## 2022-04-29 DIAGNOSIS — Z6834 Body mass index (BMI) 34.0-34.9, adult: Secondary | ICD-10-CM | POA: Insufficient documentation

## 2022-04-29 DIAGNOSIS — I10 Essential (primary) hypertension: Secondary | ICD-10-CM

## 2022-04-29 DIAGNOSIS — R0602 Shortness of breath: Secondary | ICD-10-CM

## 2022-04-29 DIAGNOSIS — E669 Obesity, unspecified: Secondary | ICD-10-CM | POA: Diagnosis not present

## 2022-04-29 MED ORDER — AMLODIPINE BESYLATE 10 MG PO TABS: 10.0000 mg | ORAL_TABLET | Freq: Every day | ORAL | 0 refills | Status: DC

## 2022-04-29 NOTE — Progress Notes (Unsigned)
Chief Complaint:   OBESITY Jennifer Frazier is here to discuss her progress with her obesity treatment plan along with follow-up of her obesity related diagnoses. [name] is on keeping a food journal and adhering to recommended goals of 1200-1500 calories and 90+ grams of protein and states she is following her eating plan approximately 90% of the time. [name] states she is doing core and arm strengthening for 15 minutes 5 times per week.  Today's visit was #: 20 Starting weight: 294 lbs Starting date: 06/24/2017 Today's weight: 223 lbs Today's date: 04/29/2022 Total lbs lost to date: 71 Total lbs lost since last in-office visit: 0  Interim History: [name] has been experimenting with more variety of foods and she is working on increasing her protein intake.  Subjective:   1. SOBOE (shortness of breath on exertion) [name] notes no change in shortness of breath.  She is doing well with her exercise and working on eating healthier.  She is due to have her IC repeated.  2. Essential hypertension [name]'s blood pressure is at goal with her diet, exercise, and weight loss and amlodipine 10 mg once daily.  She denies chest pain or headache.  Assessment/Plan:   1. SOBOE (shortness of breath on exertion) Repeat IC shows her RMR has decreased approximately 150 kcal.  Trudell was educated on the importance of nutrition and exercise to improve her RMR.  2. Essential hypertension [name] will continue amlodipine 10 mg once daily, and we will refill for 90 days.  - amLODipine (NORVASC) 10 MG tablet; Take 1 tablet (10 mg total) by mouth daily.  Dispense: 90 tablet; Refill: 0  3. BMI 33.0-33.9,adult  4. Obesity, Beginning BMI 43.42 [name] is currently in the action stage of change. As such, her goal is to continue with weight loss efforts. She has agreed to keeping a food journal and adhering to recommended goals of 1200-1500 calories and 100+ grams of protein daily.   "Real food" protein ideas were discussed  and she will work on decreasing protein supplements. We discussed hidden calories and getting back to measuring.   Exercise goals: As is.   Behavioral modification strategies: increasing lean protein intake and keeping a strict food journal.  [name] has agreed to follow-up with our clinic in 8 weeks. She was informed of the importance of frequent follow-up visits to maximize her success with intensive lifestyle modifications for her multiple health conditions.   Objective:   Blood pressure 117/79, pulse 70, temperature 97.6 F (36.4 C), height 5\' 9"  (1.753 m), weight 223 lb (101.2 kg), SpO2 98 %. Body mass index is 32.93 kg/m.  Lab Results  Component Value Date   CREATININE 0.91 09/05/2021   BUN 18 09/05/2021   NA 141 09/05/2021   K 3.8 09/05/2021   CL 103 09/05/2021   CO2 22 09/05/2021   Lab Results  Component Value Date   ALT 12 09/05/2021   AST 15 09/05/2021   ALKPHOS 92 09/05/2021   BILITOT 0.4 09/05/2021   Lab Results  Component Value Date   HGBA1C 5.1 09/05/2021   HGBA1C 5.0 01/06/2021   HGBA1C 5.0 01/30/2020   HGBA1C 4.9 04/18/2019   HGBA1C 5.0 12/08/2018   Lab Results  Component Value Date   INSULIN 6.7 09/05/2021   INSULIN 3.9 01/06/2021   INSULIN 4.5 01/30/2020   INSULIN 6.7 04/18/2019   INSULIN 11.0 12/08/2018   Lab Results  Component Value Date   TSH 1.65 09/04/2020   Lab Results  Component Value Date  CHOL 148 09/05/2021   HDL 78 09/05/2021   LDLCALC 60 09/05/2021   TRIG 46 09/05/2021   Lab Results  Component Value Date   VD25OH 49.8 09/05/2021   VD25OH 40.8 01/06/2021   VD25OH 59.9 04/18/2019   Lab Results  Component Value Date   WBC 4.0 09/04/2020   HGB 14.3 09/04/2020   HCT 43 09/04/2020   MCV 85.1 11/13/2019   PLT 214 09/04/2020   No results found for: "IRON", "TIBC", "FERRITIN"  Attestation Statements:   Reviewed by clinician on day of visit: allergies, medications, problem list, medical history, surgical history, family  history, social history, and previous encounter notes.   I, Trixie Dredge, am acting as transcriptionist for Dennard Nip, MD.  I have reviewed the above documentation for accuracy and completeness, and I agree with the above. -  Dennard Nip, MD

## 2022-07-22 ENCOUNTER — Encounter (INDEPENDENT_AMBULATORY_CARE_PROVIDER_SITE_OTHER): Payer: Self-pay | Admitting: Family Medicine

## 2022-07-22 ENCOUNTER — Ambulatory Visit (INDEPENDENT_AMBULATORY_CARE_PROVIDER_SITE_OTHER): Payer: 59 | Admitting: Family Medicine

## 2022-07-22 VITALS — BP 123/85 | HR 81 | Temp 98.0°F | Ht 69.0 in | Wt 226.0 lb

## 2022-07-22 DIAGNOSIS — E88819 Insulin resistance, unspecified: Secondary | ICD-10-CM | POA: Diagnosis not present

## 2022-07-22 DIAGNOSIS — E669 Obesity, unspecified: Secondary | ICD-10-CM | POA: Diagnosis not present

## 2022-07-22 DIAGNOSIS — I10 Essential (primary) hypertension: Secondary | ICD-10-CM | POA: Diagnosis not present

## 2022-07-22 DIAGNOSIS — Z6833 Body mass index (BMI) 33.0-33.9, adult: Secondary | ICD-10-CM

## 2022-07-22 DIAGNOSIS — E559 Vitamin D deficiency, unspecified: Secondary | ICD-10-CM | POA: Diagnosis not present

## 2022-07-22 MED ORDER — AMLODIPINE BESYLATE 10 MG PO TABS: 10.0000 mg | ORAL_TABLET | Freq: Every day | ORAL | 0 refills | Status: DC

## 2022-07-22 MED ORDER — METFORMIN HCL 500 MG PO TABS
500.0000 mg | ORAL_TABLET | Freq: Every day | ORAL | 0 refills | Status: DC
Start: 2022-07-22 — End: 2022-10-15

## 2022-07-23 NOTE — Progress Notes (Signed)
Chief Complaint:   OBESITY Jennifer Frazier is here to discuss her progress with her obesity treatment plan along with follow-up of her obesity related diagnoses. Jennifer Frazier is on keeping a food journal and adhering to recommended goals of 1200-1500 calories and 100+ grams of protein and states she is following her eating plan approximately 90% of the time. Jennifer Frazier states she is doing cardio and weights for 30-45 minutes 5 times per week.  Today's visit was #: 83 Starting weight: 294 lbs Starting date: 06/24/2017 Today's weight: 226 lbs Today's date: 07/22/2022 Total lbs lost to date: 68 Total lbs lost since last in-office visit: 0  Interim History: Patient is working on Orthoptist.  She is trying to follow a plant-based diet and she is doing a lot of reading about various eating strategies.  She is working on increasing her protein but not always meeting her goal.  Subjective:   1. Insulin resistance Patient is struggling with some polyphagia.  She had been on a low-dose metformin in the past, but she did not fill the benefit.  2. Vitamin D deficiency Patient's last vitamin D level was at goal, and she is not on vitamin D currently.  She is due to have her labs rechecked.  3. Essential hypertension Patient's blood pressure is well-controlled on Norvasc.  She denies chest pain or dizziness.  Assessment/Plan:   1. Insulin resistance Patient agreed to start metformin 500 mg every morning with food with no refills; she will increase her dose as needed.  We will check labs today.  - Vitamin B12 - CMP14+EGFR - Insulin, random - Hemoglobin A1c - metFORMIN (GLUCOPHAGE) 500 MG tablet; Take 1 tablet (500 mg total) by mouth daily with breakfast.  Dispense: 30 tablet; Refill: 0  2. Vitamin D deficiency We will check labs today, and we will follow-up at patient's next visit.  - VITAMIN D 25 Hydroxy (Vit-D Deficiency, Fractures)  3. Essential hypertension We will check labs today, and we will  refill Norvasc for 90 days.  - amLODipine (NORVASC) 10 MG tablet; Take 1 tablet (10 mg total) by mouth daily.  Dispense: 90 tablet; Refill: 0  4. BMI 33.0-33.9,adult  5. Obesity, Beginning BMI 43.42 Jennifer Frazier is currently in the action stage of change. As such, her goal is to continue with weight loss efforts. She has agreed to keeping a food journal and adhering to recommended goals of 1200-1500 calories and 100+ grams of protein daily.   Exercise goals: As is.   Behavioral modification strategies: increasing lean protein intake.  Jennifer Frazier has agreed to follow-up with our clinic in 4 weeks. She was informed of the importance of frequent follow-up visits to maximize her success with intensive lifestyle modifications for her multiple health conditions.   Jennifer Frazier was informed we would discuss her lab results at her next visit unless there is a critical issue that needs to be addressed sooner. Jennifer Frazier agreed to keep her next visit at the agreed upon time to discuss these results.  Objective:   Blood pressure 123/85, pulse 81, temperature 98 F (36.7 C), height 5\' 9"  (1.753 m), weight 226 lb (102.5 kg), SpO2 98 %. Body mass index is 33.37 kg/m.  Lab Results  Component Value Date   CREATININE 0.91 09/05/2021   BUN 18 09/05/2021   NA 141 09/05/2021   K 3.8 09/05/2021   CL 103 09/05/2021   CO2 22 09/05/2021   Lab Results  Component Value Date   ALT 12 09/05/2021   AST 15  09/05/2021   ALKPHOS 92 09/05/2021   BILITOT 0.4 09/05/2021   Lab Results  Component Value Date   HGBA1C 5.1 09/05/2021   HGBA1C 5.0 01/06/2021   HGBA1C 5.0 01/30/2020   HGBA1C 4.9 04/18/2019   HGBA1C 5.0 12/08/2018   Lab Results  Component Value Date   INSULIN 6.7 09/05/2021   INSULIN 3.9 01/06/2021   INSULIN 4.5 01/30/2020   INSULIN 6.7 04/18/2019   INSULIN 11.0 12/08/2018   Lab Results  Component Value Date   TSH 1.65 09/04/2020   Lab Results  Component Value Date   CHOL 148 09/05/2021   HDL 78  09/05/2021   LDLCALC 60 09/05/2021   TRIG 46 09/05/2021   Lab Results  Component Value Date   VD25OH 49.8 09/05/2021   VD25OH 40.8 01/06/2021   VD25OH 59.9 04/18/2019   Lab Results  Component Value Date   WBC 4.0 09/04/2020   HGB 14.3 09/04/2020   HCT 43 09/04/2020   MCV 85.1 11/13/2019   PLT 214 09/04/2020   No results found for: "IRON", "TIBC", "FERRITIN"  Attestation Statements:   Reviewed by clinician on day of visit: allergies, medications, problem list, medical history, surgical history, family history, social history, and previous encounter notes.  Time spent on visit including pre-visit chart review and post-visit care and charting was 42 minutes.   I, Burt Knack, am acting as transcriptionist for Quillian Quince, MD.  I have reviewed the above documentation for accuracy and completeness, and I agree with the above. -  Quillian Quince, MD

## 2022-07-24 LAB — INSULIN, RANDOM: INSULIN: 7.3 u[IU]/mL (ref 2.6–24.9)

## 2022-07-24 LAB — CMP14+EGFR
ALT: 13 IU/L (ref 0–32)
AST: 19 IU/L (ref 0–40)
Albumin: 4.4 g/dL (ref 3.9–4.9)
Alkaline Phosphatase: 97 IU/L (ref 44–121)
BUN/Creatinine Ratio: 20 (ref 9–23)
BUN: 17 mg/dL (ref 6–24)
Bilirubin Total: 0.4 mg/dL (ref 0.0–1.2)
CO2: 25 mmol/L (ref 20–29)
Calcium: 9.9 mg/dL (ref 8.7–10.2)
Chloride: 103 mmol/L (ref 96–106)
Creatinine, Ser: 0.83 mg/dL (ref 0.57–1.00)
Globulin, Total: 2.9 g/dL (ref 1.5–4.5)
Glucose: 85 mg/dL (ref 70–99)
Potassium: 4.2 mmol/L (ref 3.5–5.2)
Sodium: 143 mmol/L (ref 134–144)
Total Protein: 7.3 g/dL (ref 6.0–8.5)
eGFR: 86 mL/min/{1.73_m2} (ref >59)

## 2022-07-24 LAB — HEMOGLOBIN A1C
Est. average glucose Bld gHb Est-mCnc: 103 mg/dL
Hgb A1c MFr Bld: 5.2 % (ref 4.8–5.6)

## 2022-07-24 LAB — VITAMIN B12: Vitamin B-12: 1466 pg/mL — ABNORMAL HIGH (ref 232–1245)

## 2022-07-24 LAB — VITAMIN D 25 HYDROXY (VIT D DEFICIENCY, FRACTURES): Vit D, 25-Hydroxy: 36.9 ng/mL (ref 30.0–100.0)

## 2022-08-26 ENCOUNTER — Ambulatory Visit (INDEPENDENT_AMBULATORY_CARE_PROVIDER_SITE_OTHER): Payer: 59 | Admitting: Family Medicine

## 2022-09-03 ENCOUNTER — Ambulatory Visit (INDEPENDENT_AMBULATORY_CARE_PROVIDER_SITE_OTHER): Payer: 59 | Admitting: Family Medicine

## 2022-09-09 ENCOUNTER — Other Ambulatory Visit (INDEPENDENT_AMBULATORY_CARE_PROVIDER_SITE_OTHER): Payer: Self-pay | Admitting: Family Medicine

## 2022-09-09 ENCOUNTER — Encounter (INDEPENDENT_AMBULATORY_CARE_PROVIDER_SITE_OTHER): Payer: Self-pay | Admitting: Family Medicine

## 2022-09-09 ENCOUNTER — Ambulatory Visit (INDEPENDENT_AMBULATORY_CARE_PROVIDER_SITE_OTHER): Payer: 59 | Admitting: Family Medicine

## 2022-09-09 VITALS — BP 127/88 | HR 71 | Temp 98.1°F | Ht 69.0 in | Wt 225.0 lb

## 2022-09-09 DIAGNOSIS — E669 Obesity, unspecified: Secondary | ICD-10-CM

## 2022-09-09 DIAGNOSIS — E88819 Insulin resistance, unspecified: Secondary | ICD-10-CM

## 2022-09-09 DIAGNOSIS — Z6833 Body mass index (BMI) 33.0-33.9, adult: Secondary | ICD-10-CM

## 2022-09-09 DIAGNOSIS — I10 Essential (primary) hypertension: Secondary | ICD-10-CM

## 2022-09-09 MED ORDER — AMLODIPINE BESYLATE 10 MG PO TABS
10.0000 mg | ORAL_TABLET | Freq: Every day | ORAL | 0 refills | Status: DC
Start: 2022-09-09 — End: 2022-10-15

## 2022-09-14 NOTE — Progress Notes (Unsigned)
Chief Complaint:   OBESITY Jennifer Frazier is here to discuss her progress with her obesity treatment plan along with follow-up of her obesity related diagnoses. Jennifer Frazier is on keeping a food journal and adhering to recommended goals of 1200-1500 calories and 100+ grams of protein and states she is following her eating plan approximately 80% of the time. Jennifer Frazier states she is walking for 30 minutes 5 times per week, and lifting weights 2-3 times per week.  Today's visit was #: 84 Starting weight: 294 lbs Starting date: 06/24/2017 Today's weight: 226 lbs Today's date: 09/09/2022 Total lbs lost to date: 68 Total lbs lost since last in-office visit: 0  Interim History: Patient has done well with maintaining her weight loss.  She has been reading on pre and probiotics and she is taking OTC supplements.  She is frustrated with how difficult weight loss is.  Subjective:   1. Essential hypertension Patient's blood pressure is stable with her diet, exercise, and Norvasc.  She denies hypotension.  2. Insulin resistance Patient is still struggling with polyphagia.  She was hesitant to start metformin, but she is reconsidering after struggling with polyphagia.  Assessment/Plan:   1. Essential hypertension Patient will continue Norvasc 10 mg once daily, and we will refill for 1 month.  - amLODipine (NORVASC) 10 MG tablet; Take 1 tablet (10 mg total) by mouth daily.  Dispense: 30 tablet; Refill: 0  2. Insulin resistance Patient agreed to start metformin (no refill needed), and she will continue with her diet and exercise.  3. BMI 33.0-33.9,adult  4. Obesity, Beginning BMI 43.42 Jennifer Frazier is currently in the action stage of change. As such, her goal is to continue with weight loss efforts. She has agreed to keeping a food journal and adhering to recommended goals of 1200-1500 calories and 100+ grams of protein daily.   Patient is okay to take pre and probiotics, GI microbians was discussed as it relates  to health and weight loss.  Exercise goals: As is.   Behavioral modification strategies: increasing lean protein intake, decreasing simple carbohydrates, and increasing high fiber foods.  Jennifer Frazier has agreed to follow-up with our clinic in 4 weeks. She was informed of the importance of frequent follow-up visits to maximize her success with intensive lifestyle modifications for her multiple health conditions.   Objective:   Blood pressure 127/88, pulse 71, temperature 98.1 F (36.7 C), height 5\' 9"  (1.753 m), weight 225 lb (102.1 kg), SpO2 98%. Body mass index is 33.23 kg/m.  Lab Results  Component Value Date   CREATININE 0.83 07/22/2022   BUN 17 07/22/2022   NA 143 07/22/2022   K 4.2 07/22/2022   CL 103 07/22/2022   CO2 25 07/22/2022   Lab Results  Component Value Date   ALT 13 07/22/2022   AST 19 07/22/2022   ALKPHOS 97 07/22/2022   BILITOT 0.4 07/22/2022   Lab Results  Component Value Date   HGBA1C 5.2 07/22/2022   HGBA1C 5.1 09/05/2021   HGBA1C 5.0 01/06/2021   HGBA1C 5.0 01/30/2020   HGBA1C 4.9 04/18/2019   Lab Results  Component Value Date   INSULIN 7.3 07/22/2022   INSULIN 6.7 09/05/2021   INSULIN 3.9 01/06/2021   INSULIN 4.5 01/30/2020   INSULIN 6.7 04/18/2019   Lab Results  Component Value Date   TSH 1.65 09/04/2020   Lab Results  Component Value Date   CHOL 148 09/05/2021   HDL 78 09/05/2021   LDLCALC 60 09/05/2021   TRIG 46 09/05/2021  Lab Results  Component Value Date   VD25OH 36.9 07/22/2022   VD25OH 49.8 09/05/2021   VD25OH 40.8 01/06/2021   Lab Results  Component Value Date   WBC 4.0 09/04/2020   HGB 14.3 09/04/2020   HCT 43 09/04/2020   MCV 85.1 11/13/2019   PLT 214 09/04/2020   No results found for: "IRON", "TIBC", "FERRITIN"  Attestation Statements:   Reviewed by clinician on day of visit: allergies, medications, problem list, medical history, surgical history, family history, social history, and previous encounter  notes.   I, Burt Knack, am acting as transcriptionist for Quillian Quince, MD.  I have reviewed the above documentation for accuracy and completeness, and I agree with the above. -  Quillian Quince, MD

## 2022-09-16 ENCOUNTER — Other Ambulatory Visit: Payer: Self-pay | Admitting: Family Medicine

## 2022-09-16 DIAGNOSIS — Z1231 Encounter for screening mammogram for malignant neoplasm of breast: Secondary | ICD-10-CM

## 2022-10-07 ENCOUNTER — Ambulatory Visit (INDEPENDENT_AMBULATORY_CARE_PROVIDER_SITE_OTHER): Payer: 59 | Admitting: Family Medicine

## 2022-10-07 ENCOUNTER — Other Ambulatory Visit (INDEPENDENT_AMBULATORY_CARE_PROVIDER_SITE_OTHER): Payer: Self-pay | Admitting: Family Medicine

## 2022-10-07 DIAGNOSIS — I10 Essential (primary) hypertension: Secondary | ICD-10-CM

## 2022-10-07 DIAGNOSIS — E88819 Insulin resistance, unspecified: Secondary | ICD-10-CM

## 2022-10-10 ENCOUNTER — Other Ambulatory Visit (INDEPENDENT_AMBULATORY_CARE_PROVIDER_SITE_OTHER): Payer: Self-pay | Admitting: Family Medicine

## 2022-10-10 DIAGNOSIS — E88819 Insulin resistance, unspecified: Secondary | ICD-10-CM

## 2022-10-15 ENCOUNTER — Encounter (INDEPENDENT_AMBULATORY_CARE_PROVIDER_SITE_OTHER): Payer: Self-pay | Admitting: Family Medicine

## 2022-10-15 ENCOUNTER — Ambulatory Visit (INDEPENDENT_AMBULATORY_CARE_PROVIDER_SITE_OTHER): Payer: 59 | Admitting: Family Medicine

## 2022-10-15 VITALS — BP 111/76 | HR 77 | Temp 97.8°F | Ht 69.0 in | Wt 227.0 lb

## 2022-10-15 DIAGNOSIS — I10 Essential (primary) hypertension: Secondary | ICD-10-CM

## 2022-10-15 DIAGNOSIS — E669 Obesity, unspecified: Secondary | ICD-10-CM

## 2022-10-15 DIAGNOSIS — Z6833 Body mass index (BMI) 33.0-33.9, adult: Secondary | ICD-10-CM

## 2022-10-15 DIAGNOSIS — E860 Dehydration: Secondary | ICD-10-CM | POA: Diagnosis not present

## 2022-10-15 DIAGNOSIS — E88819 Insulin resistance, unspecified: Secondary | ICD-10-CM | POA: Diagnosis not present

## 2022-10-15 DIAGNOSIS — N959 Unspecified menopausal and perimenopausal disorder: Secondary | ICD-10-CM

## 2022-10-15 DIAGNOSIS — G4489 Other headache syndrome: Secondary | ICD-10-CM | POA: Insufficient documentation

## 2022-10-15 MED ORDER — AMLODIPINE BESYLATE 10 MG PO TABS
10.0000 mg | ORAL_TABLET | Freq: Every day | ORAL | 0 refills | Status: DC
Start: 2022-10-15 — End: 2022-11-25

## 2022-10-15 MED ORDER — METFORMIN HCL 500 MG PO TABS
500.0000 mg | ORAL_TABLET | Freq: Every day | ORAL | 0 refills | Status: DC
Start: 2022-10-15 — End: 2022-11-25

## 2022-10-15 NOTE — Progress Notes (Signed)
[facility]  Office: [phone]  /  Fax: [phone]  WEIGHT SUMMARY AND BIOMETRICS  Anthropometric Measurements Height: 5\' 9"  (1.753 m) Weight: 227 lb (103 kg) BMI (Calculated): 33.51 Weight at Last Visit: 225 lb Weight Lost Since Last Visit: 0 Weight Gained Since Last Visit: 2 lb Total Weight Loss (lbs): 66 lb (29.9 kg)   Body Composition  Body Fat %: 44 % Fat Mass (lbs): 100 lbs Muscle Mass (lbs): 121 lbs Total Body Water (lbs): 88.2 lbs Visceral Fat Rating : 11   Other Clinical Data Fasting: No Labs: No Today's Visit #: 85    Chief Complaint: OBESITY   History of Present Illness   The patient, with a history of obesity, insulin resistance, and hypertension, presents for a follow-up visit. She is currently on metformin for insulin resistance and amlodipine for hypertension, with well-controlled blood pressure. However, she has gained two pounds since the last visit a month ago. She reports adherence to a diet of 1200-1500 calories per day, with a focus on protein intake, and maintains a regular exercise regimen of walking and strength training five to six times a week.  The patient has noticed an increase in muscle mass, which she attributes to her strength training exercises. She has been following a variety of exercise routines, including cardio, weight training, and Pilates. She reports using 10-pound weights for her strength training exercises and aims to do three sets of twelve repetitions for each exercise.  The patient also reports an increase in the frequency of hot flashes, which occur throughout the day. She describes these hot flashes as being at a level three or four in intensity. She also reports occasional headaches, which she attributes to possible dehydration due to her weight loss efforts. She has noticed no significant changes in her memory or sleep patterns.  The patient has run out of her metformin medication and reports experiencing mild nausea and  headaches since discontinuing the medication. She has previously considered a hysterectomy but decided against it due to concerns about entering menopause.          PHYSICAL EXAM:  Blood pressure 111/76, pulse 77, temperature 97.8 F (36.6 C), height 5\' 9"  (1.753 m), weight 227 lb (103 kg), SpO2 98%. Body mass index is 33.52 kg/m.  DIAGNOSTIC DATA REVIEWED:  BMET    Component Value Date/Time   NA 143 07/22/2022 0759   NA 140 11/04/2016 1500   K 4.2 07/22/2022 0759   K 3.8 11/04/2016 1500   CL 103 07/22/2022 0759   CO2 25 07/22/2022 0759   CO2 27 11/04/2016 1500   GLUCOSE 85 07/22/2022 0759   GLUCOSE 81 11/13/2019 1321   GLUCOSE 88 11/04/2016 1500   BUN 17 07/22/2022 0759   BUN 17.6 11/04/2016 1500   CREATININE 0.83 07/22/2022 0759   CREATININE 1.1 11/04/2016 1500   CALCIUM 9.9 07/22/2022 0759   CALCIUM 9.6 11/04/2016 1500   GFRNONAA 68 01/30/2020 1155   GFRNONAA >60 11/13/2019 1321   GFRAA 78 01/30/2020 1155   Lab Results  Component Value Date   HGBA1C 5.2 07/22/2022   HGBA1C 5.3 06/24/2017   Lab Results  Component Value Date   INSULIN 7.3 07/22/2022   INSULIN 6.2 06/24/2017   Lab Results  Component Value Date   TSH 1.65 09/04/2020   CBC    Component Value Date/Time   WBC 4.0 09/04/2020 0000   WBC 6.0 11/13/2019 1321   RBC 5.12 (A) 09/04/2020 0000   HGB 14.3 09/04/2020 0000  HGB 13.8 06/24/2017 1236   HGB 13.3 11/04/2016 1500   HCT 43 09/04/2020 0000   HCT 43.5 06/24/2017 1236   HCT 40.6 11/04/2016 1500   PLT 214 09/04/2020 0000   PLT 208 11/04/2016 1500   MCV 85.1 11/13/2019 1321   MCV 86 06/24/2017 1236   MCV 84.4 11/04/2016 1500   MCH 28.5 11/13/2019 1321   MCHC 33.5 11/13/2019 1321   RDW 13.0 11/13/2019 1321   RDW 14.8 06/24/2017 1236   RDW 13.9 11/04/2016 1500   Iron Studies No results found for: "IRON", "TIBC", "FERRITIN", "IRONPCTSAT" Lipid Panel     Component Value Date/Time   CHOL 148 09/05/2021 1519   TRIG 46 09/05/2021 1519    HDL 78 09/05/2021 1519   LDLCALC 60 09/05/2021 1519   Hepatic Function Panel     Component Value Date/Time   PROT 7.3 07/22/2022 0759   PROT 7.5 11/04/2016 1500   ALBUMIN 4.4 07/22/2022 0759   ALBUMIN 3.8 11/04/2016 1500   AST 19 07/22/2022 0759   AST 20 11/04/2016 1500   ALT 13 07/22/2022 0759   ALT 15 11/04/2016 1500   ALKPHOS 97 07/22/2022 0759   ALKPHOS 87 11/04/2016 1500   BILITOT 0.4 07/22/2022 0759   BILITOT 0.31 11/04/2016 1500      Component Value Date/Time   TSH 1.65 09/04/2020 0000   TSH 1.600 04/18/2019 1228   Nutritional Lab Results  Component Value Date   VD25OH 36.9 07/22/2022   VD25OH 49.8 09/05/2021   VD25OH 40.8 01/06/2021     Assessment and Plan    Obesity Gained 2 pounds since last visit, but some of this is muscle mass. Patient is adhering to a calorie-controlled diet and regular exercise regimen, including strength training. Discussed the importance of challenging muscles to the point of exhaustion for optimal strengthening and calorie burning, and the need for muscle recovery time. -Continue current diet and exercise regimen. -Consider increasing weight or sets in strength training to reach point of exhaustion.  Hypertension Well controlled on current medication, with today's reading at 111/76. -Continue Amlodipine. Refill sent to pharmacy.  Insulin Resistance Patient ran out of Metformin recently and experienced some mild side effects upon resuming the medication. -Continue Metformin. Refill sent to pharmacy.  Menopausal Symptoms post total hysterectomy Increasing frequency of hot flashes, affecting quality of life. Discussed the potential benefits of hormone replacement therapy (HRT) and the need to consult with a GYN or primary care provider. -Consult with primary care provider or GYN regarding HRT options. -pt reassured hormone replacement will not significantly increase weight gain.   Dehydration Patient experiencing headaches,  possibly due to dehydration associated with weight loss. -Increase fluid intake, particularly during exercise.         I have personally spent 42 minutes total time today in preparation, patient care, and documentation for this visit, including the following: review of clinical lab tests; review of medical tests/procedures/services.    She was informed of the importance of frequent follow up visits to maximize her success with intensive lifestyle modifications for her multiple health conditions.    Quillian Quince, MD

## 2022-10-20 ENCOUNTER — Ambulatory Visit
Admission: RE | Admit: 2022-10-20 | Discharge: 2022-10-20 | Disposition: A | Discharge status: Home / Self Care | Payer: 59 | Source: Ambulatory Visit | Attending: Family Medicine | Admitting: Family Medicine

## 2022-10-20 DIAGNOSIS — Z1231 Encounter for screening mammogram for malignant neoplasm of breast: Secondary | ICD-10-CM

## 2022-11-09 ENCOUNTER — Other Ambulatory Visit (INDEPENDENT_AMBULATORY_CARE_PROVIDER_SITE_OTHER): Payer: Self-pay | Admitting: Family Medicine

## 2022-11-09 DIAGNOSIS — E88819 Insulin resistance, unspecified: Secondary | ICD-10-CM

## 2022-11-16 ENCOUNTER — Telehealth (INDEPENDENT_AMBULATORY_CARE_PROVIDER_SITE_OTHER): Payer: Self-pay | Admitting: Family Medicine

## 2022-11-16 ENCOUNTER — Other Ambulatory Visit (INDEPENDENT_AMBULATORY_CARE_PROVIDER_SITE_OTHER): Payer: Self-pay | Admitting: Family Medicine

## 2022-11-16 DIAGNOSIS — E88819 Insulin resistance, unspecified: Secondary | ICD-10-CM

## 2022-11-16 NOTE — Telephone Encounter (Signed)
Patient called in to get a refill on her Metformin sent to [facility]. Patient does have an appointment scheduled for next week.  Thank you

## 2022-11-25 ENCOUNTER — Encounter (INDEPENDENT_AMBULATORY_CARE_PROVIDER_SITE_OTHER): Payer: Self-pay | Admitting: Family Medicine

## 2022-11-25 ENCOUNTER — Ambulatory Visit (INDEPENDENT_AMBULATORY_CARE_PROVIDER_SITE_OTHER): Payer: 59 | Admitting: Family Medicine

## 2022-11-25 VITALS — BP 113/76 | HR 77 | Temp 98.0°F | Ht 69.0 in | Wt 227.0 lb

## 2022-11-25 DIAGNOSIS — E88819 Insulin resistance, unspecified: Secondary | ICD-10-CM | POA: Diagnosis not present

## 2022-11-25 DIAGNOSIS — Z6833 Body mass index (BMI) 33.0-33.9, adult: Secondary | ICD-10-CM

## 2022-11-25 DIAGNOSIS — E669 Obesity, unspecified: Secondary | ICD-10-CM | POA: Diagnosis not present

## 2022-11-25 DIAGNOSIS — I10 Essential (primary) hypertension: Secondary | ICD-10-CM

## 2022-11-25 DIAGNOSIS — N951 Menopausal and female climacteric states: Secondary | ICD-10-CM

## 2022-11-25 MED ORDER — AMLODIPINE BESYLATE 10 MG PO TABS: 10.0000 mg | ORAL_TABLET | Freq: Every day | ORAL | 0 refills | Status: DC

## 2022-11-25 MED ORDER — METFORMIN HCL 500 MG PO TABS: 500.0000 mg | ORAL_TABLET | Freq: Every day | ORAL | 0 refills | Status: DC

## 2022-11-25 MED ORDER — ESCITALOPRAM OXALATE 10 MG PO TABS: 10.0000 mg | ORAL_TABLET | Freq: Every day | ORAL | 0 refills | Status: DC

## 2022-11-25 NOTE — Progress Notes (Unsigned)
Chief Complaint:   OBESITY Jennifer Frazier is here to discuss her progress with her obesity treatment plan along with follow-up of her obesity related diagnoses. Jennifer Frazier is on keeping a food journal and adhering to recommended goals of 1200-1500 calories and 100+ grams of protein and states she is following her eating plan approximately 90% of the time. Jennifer Frazier states she is walking for 30 minutes 5 times per week.  Today's visit was #: 86 Starting weight: 294 lbs Starting date: 06/24/2017 Today's weight: 227 lbs Today's date: 11/25/2022 Total lbs lost to date: 67 Total lbs lost since last in-office visit: 0  Interim History: Patient has done well with maintaining her weight loss.  She has continued to walk 1-2 times per day on most days, but she has not done as much strengthening exercise.  She does not still Halloween candy will be a temptation.  Subjective:   1. Perimenopause Patient has very irregular cycles with some spotting.  She notes increased hot flashes, and decreased mood and irritability.  2. Insulin resistance Patient is on metformin, and her last labs were within normal limits.  She continues to do well with her diet.  3. Essential hypertension Patient's blood pressure is well-controlled with her diet, weight loss, and medications.  Assessment/Plan:   1. Perimenopause Patient agreed to start Lexapro 10 mg once daily with no refills.  We will follow-up at her next visit in 1 month.  - escitalopram (LEXAPRO) 10 MG tablet; Take 1 tablet (10 mg total) by mouth daily.  Dispense: 30 tablet; Refill: 0  2. Insulin resistance Patient will continue metformin 500 mg every morning, and we will refill for 1 month.  - metFORMIN (GLUCOPHAGE) 500 MG tablet; Take 1 tablet (500 mg total) by mouth daily with breakfast.  Dispense: 30 tablet; Refill: 0  3. Essential hypertension Patient will continue amlodipine 10 mg once daily, and we will refill for 1 month.  - amLODipine (NORVASC) 10  MG tablet; Take 1 tablet (10 mg total) by mouth daily.  Dispense: 30 tablet; Refill: 0  4. BMI 33.0-33.9,adult  5. Obesity, Beginning BMI 43.42 Jennifer Frazier is currently in the action stage of change. As such, her goal is to continue with weight loss efforts. She has agreed to keeping a food journal and adhering to recommended goals of 1200-1500 calories and 85+ grams of protein daily.   Exercise goals: As is, add strengthening when possible.   Behavioral modification strategies: holiday eating strategies .  Jennifer Frazier has agreed to follow-up with our clinic in 4 weeks. She was informed of the importance of frequent follow-up visits to maximize her success with intensive lifestyle modifications for her multiple health conditions.   Objective:   Blood pressure 113/76, pulse 77, temperature 98 F (36.7 C), height 5\' 9"  (1.753 m), weight 227 lb (103 kg), SpO2 100%. Body mass index is 33.52 kg/m.  Lab Results  Component Value Date   CREATININE 0.83 07/22/2022   BUN 17 07/22/2022   NA 143 07/22/2022   K 4.2 07/22/2022   CL 103 07/22/2022   CO2 25 07/22/2022   Lab Results  Component Value Date   ALT 13 07/22/2022   AST 19 07/22/2022   ALKPHOS 97 07/22/2022   BILITOT 0.4 07/22/2022   Lab Results  Component Value Date   HGBA1C 5.2 07/22/2022   HGBA1C 5.1 09/05/2021   HGBA1C 5.0 01/06/2021   HGBA1C 5.0 01/30/2020   HGBA1C 4.9 04/18/2019   Lab Results  Component Value Date  INSULIN 7.3 07/22/2022   INSULIN 6.7 09/05/2021   INSULIN 3.9 01/06/2021   INSULIN 4.5 01/30/2020   INSULIN 6.7 04/18/2019   Lab Results  Component Value Date   TSH 1.65 09/04/2020   Lab Results  Component Value Date   CHOL 148 09/05/2021   HDL 78 09/05/2021   LDLCALC 60 09/05/2021   TRIG 46 09/05/2021   Lab Results  Component Value Date   VD25OH 36.9 07/22/2022   VD25OH 49.8 09/05/2021   VD25OH 40.8 01/06/2021   Lab Results  Component Value Date   WBC 4.0 09/04/2020   HGB 14.3 09/04/2020   HCT  43 09/04/2020   MCV 85.1 11/13/2019   PLT 214 09/04/2020   No results found for: "IRON", "TIBC", "FERRITIN"  Attestation Statements:   Reviewed by clinician on day of visit: allergies, medications, problem list, medical history, surgical history, family history, social history, and previous encounter notes.   I, Burt Knack, am acting as transcriptionist for Quillian Quince, MD.  I have reviewed the above documentation for accuracy and completeness, and I agree with the above. -  Quillian Quince, MD

## 2022-12-30 ENCOUNTER — Encounter (INDEPENDENT_AMBULATORY_CARE_PROVIDER_SITE_OTHER): Payer: Self-pay | Admitting: Family Medicine

## 2022-12-31 ENCOUNTER — Other Ambulatory Visit: Payer: Self-pay | Admitting: Nurse Practitioner

## 2022-12-31 DIAGNOSIS — E88819 Insulin resistance, unspecified: Secondary | ICD-10-CM

## 2022-12-31 DIAGNOSIS — N951 Menopausal and female climacteric states: Secondary | ICD-10-CM

## 2022-12-31 MED ORDER — METFORMIN HCL 500 MG PO TABS: 500.0000 mg | ORAL_TABLET | Freq: Every day | ORAL | 0 refills | Status: DC

## 2022-12-31 MED ORDER — ESCITALOPRAM OXALATE 10 MG PO TABS: 10.0000 mg | ORAL_TABLET | Freq: Every day | ORAL | 0 refills | Status: DC

## 2023-01-04 ENCOUNTER — Ambulatory Visit (INDEPENDENT_AMBULATORY_CARE_PROVIDER_SITE_OTHER): Payer: 59 | Admitting: Family Medicine

## 2023-01-04 ENCOUNTER — Encounter (INDEPENDENT_AMBULATORY_CARE_PROVIDER_SITE_OTHER): Payer: Self-pay | Admitting: Family Medicine

## 2023-01-04 VITALS — BP 123/88 | HR 65 | Temp 97.6°F | Ht 69.0 in | Wt 229.0 lb

## 2023-01-04 DIAGNOSIS — I10 Essential (primary) hypertension: Secondary | ICD-10-CM

## 2023-01-04 DIAGNOSIS — F3289 Other specified depressive episodes: Secondary | ICD-10-CM | POA: Diagnosis not present

## 2023-01-04 DIAGNOSIS — R7303 Prediabetes: Secondary | ICD-10-CM | POA: Insufficient documentation

## 2023-01-04 DIAGNOSIS — Z6833 Body mass index (BMI) 33.0-33.9, adult: Secondary | ICD-10-CM

## 2023-01-04 DIAGNOSIS — F5089 Other specified eating disorder: Secondary | ICD-10-CM

## 2023-01-04 DIAGNOSIS — E669 Obesity, unspecified: Secondary | ICD-10-CM

## 2023-01-04 MED ORDER — METFORMIN HCL 500 MG PO TABS: 500.0000 mg | ORAL_TABLET | Freq: Every day | ORAL | 0 refills | Status: DC

## 2023-01-04 MED ORDER — AMLODIPINE BESYLATE 10 MG PO TABS: 10.0000 mg | ORAL_TABLET | Freq: Every day | ORAL | 0 refills | Status: DC

## 2023-01-04 MED ORDER — ESCITALOPRAM OXALATE 20 MG PO TABS: 20.0000 mg | ORAL_TABLET | Freq: Every day | ORAL | 0 refills | Status: DC

## 2023-01-04 NOTE — Progress Notes (Signed)
.smr  Office: [phone]  /  Fax: [phone]  WEIGHT SUMMARY AND BIOMETRICS  Anthropometric Measurements Height: 5\' 9"  (1.753 m) Weight: 229 lb (103.9 kg) BMI (Calculated): 33.8 Weight at Last Visit: 227 lb Weight Lost Since Last Visit: 0 Weight Gained Since Last Visit: 2 lb Starting Weight: 294 lb Total Weight Loss (lbs): 64 lb (29 kg)   Body Composition  Body Fat %: 45 % Fat Mass (lbs): 103.4 lbs Muscle Mass (lbs): 119.8 lbs Total Body Water (lbs): 92 lbs Visceral Fat Rating : 11   Other Clinical Data Fasting: no Labs: no Today's Visit #: 22 Starting Date: 06/24/17    Chief Complaint: OBESITY   History of Present Illness   The patient, with a history of hypertension, prediabetes, and depression, presents for a routine follow-up. She is currently on Norvasc for hypertension, which is well-controlled with a recent reading of 123/88. She is also on Metformin for prediabetes and has been making dietary changes to decrease simple carbohydrate intake. For her mood, she is on Lexapro and has requested a refill for all medications.  Over the past six weeks, the patient has gained two pounds, which includes the Thanksgiving period. She has been journaling more frequently and aims for a daily caloric intake of 1200-1500 calories, with a focus on protein intake of 85 grams or more. She is also engaging in physical activity, including walking and weight training, for 30 minutes five times a week.  The patient has been experiencing hot flashes, which she rates as a 3 on a severity scale. These hot flashes seem to occur more frequently when it is cold outside, and less so during warmer weather. She has also noticed some bloating and a feeling of water retention, particularly in her feet, despite not consuming salty foods.  The patient has been trying to incorporate strength training into her routine but has been hesitant due to concerns about her back. She has been walking her dog  daily and using a treadmill for additional exercise. She has expressed interest in joining a gym and working with a trainer for more structured strength training.  The patient's sleep has been improving, although she still occasionally goes to bed late. She has not noticed any significant changes in her mood or emotional eating behaviors. She has been adhering to her medication regimen and has not reported any new symptoms or side effects.          PHYSICAL EXAM:  Blood pressure 123/88, pulse 65, temperature 97.6 F (36.4 C), height 5\' 9"  (1.753 m), weight 229 lb (103.9 kg), last menstrual period 01/26/2014, SpO2 99%. Body mass index is 33.82 kg/m.  DIAGNOSTIC DATA REVIEWED:  BMET    Component Value Date/Time   NA 143 07/22/2022 0759   NA 140 11/04/2016 1500   K 4.2 07/22/2022 0759   K 3.8 11/04/2016 1500   CL 103 07/22/2022 0759   CO2 25 07/22/2022 0759   CO2 27 11/04/2016 1500   GLUCOSE 85 07/22/2022 0759   GLUCOSE 81 11/13/2019 1321   GLUCOSE 88 11/04/2016 1500   BUN 17 07/22/2022 0759   BUN 17.6 11/04/2016 1500   CREATININE 0.83 07/22/2022 0759   CREATININE 1.1 11/04/2016 1500   CALCIUM 9.9 07/22/2022 0759   CALCIUM 9.6 11/04/2016 1500   GFRNONAA 68 01/30/2020 1155   GFRNONAA >60 11/13/2019 1321   GFRAA 78 01/30/2020 1155   Lab Results  Component Value Date   HGBA1C 5.2 07/22/2022   HGBA1C 5.3 06/24/2017  Lab Results  Component Value Date   INSULIN 7.3 07/22/2022   INSULIN 6.2 06/24/2017   Lab Results  Component Value Date   TSH 1.65 09/04/2020   CBC    Component Value Date/Time   WBC 4.0 09/04/2020 0000   WBC 6.0 11/13/2019 1321   RBC 5.12 (A) 09/04/2020 0000   HGB 14.3 09/04/2020 0000   HGB 13.8 06/24/2017 1236   HGB 13.3 11/04/2016 1500   HCT 43 09/04/2020 0000   HCT 43.5 06/24/2017 1236   HCT 40.6 11/04/2016 1500   PLT 214 09/04/2020 0000   PLT 208 11/04/2016 1500   MCV 85.1 11/13/2019 1321   MCV 86 06/24/2017 1236   MCV 84.4 11/04/2016  1500   MCH 28.5 11/13/2019 1321   MCHC 33.5 11/13/2019 1321   RDW 13.0 11/13/2019 1321   RDW 14.8 06/24/2017 1236   RDW 13.9 11/04/2016 1500   Iron Studies No results found for: "IRON", "TIBC", "FERRITIN", "IRONPCTSAT" Lipid Panel     Component Value Date/Time   CHOL 148 09/05/2021 1519   TRIG 46 09/05/2021 1519   HDL 78 09/05/2021 1519   LDLCALC 60 09/05/2021 1519   Hepatic Function Panel     Component Value Date/Time   PROT 7.3 07/22/2022 0759   PROT 7.5 11/04/2016 1500   ALBUMIN 4.4 07/22/2022 0759   ALBUMIN 3.8 11/04/2016 1500   AST 19 07/22/2022 0759   AST 20 11/04/2016 1500   ALT 13 07/22/2022 0759   ALT 15 11/04/2016 1500   ALKPHOS 97 07/22/2022 0759   ALKPHOS 87 11/04/2016 1500   BILITOT 0.4 07/22/2022 0759   BILITOT 0.31 11/04/2016 1500      Component Value Date/Time   TSH 1.65 09/04/2020 0000   TSH 1.600 04/18/2019 1228   Nutritional Lab Results  Component Value Date   VD25OH 36.9 07/22/2022   VD25OH 49.8 09/05/2021   VD25OH 40.8 01/06/2021     Assessment and Plan    Depression with emotional eating behaviors Depression managed with Lexapro. Reports some improvement in sleep but continues to experience emotional eating behaviors. Current dose is 10 mg, considering increasing to 20 mg for better symptom control. Discussed potential benefits of increased dose and encouraged to continue journaling and maintaining a calorie goal of 1200-1500 with 85+ grams of protein per day. - Increase Lexapro to 20 mg - Refill Lexapro  Postmenopausal hot flashes Experiences mild hot flashes, rated at a 3/10, more frequently in cold weather. Currently managed with Lexapro. Discussed potential benefits of increasing Lexapro to 20 mg. - Increase Lexapro to 20 mg  Hypertension Hypertension well controlled on Norvasc. Current blood pressure is 123/88 mmHg. - Refill Norvasc  Prediabetes Prediabetes managed with metformin and dietary modifications. Continues to work on  decreasing simple carbohydrates. Gained 2 pounds in the last six weeks, likely due to water retention and holiday eating. - Refill metformin  General Health Maintenance Engaging in regular physical activity, including walking and weight training. Discussed the importance of core strengthening exercises for back issues and overall health. Encouraged to explore gym membership for structured exercise. Discussed benefits of using a wobble cushion for core exercises and potential advantages of a gym with access to trainers and medical staff. - Encourage core strengthening exercises - Consider gym membership for structured exercise  Follow-up - Schedule January appointments at the front desk - Send a message if any issues arise before the next appointment.        She was informed of the importance of  frequent follow up visits to maximize her success with intensive lifestyle modifications for her multiple health conditions.    Quillian Quince, MD

## 2023-02-01 ENCOUNTER — Ambulatory Visit (INDEPENDENT_AMBULATORY_CARE_PROVIDER_SITE_OTHER): Payer: 59 | Admitting: Family Medicine

## 2023-02-03 ENCOUNTER — Telehealth: Payer: Self-pay

## 2023-02-03 ENCOUNTER — Encounter (INDEPENDENT_AMBULATORY_CARE_PROVIDER_SITE_OTHER): Payer: Self-pay | Admitting: Family Medicine

## 2023-02-03 DIAGNOSIS — I10 Essential (primary) hypertension: Secondary | ICD-10-CM

## 2023-02-03 MED ORDER — AMLODIPINE BESYLATE 10 MG PO TABS: 10.0000 mg | ORAL_TABLET | Freq: Every day | ORAL | 0 refills | Status: DC

## 2023-02-03 NOTE — Telephone Encounter (Signed)
 Ok to rf x 1

## 2023-02-03 NOTE — Telephone Encounter (Signed)
 PA submitted for Metformin through Cover My Meds. Awaiting insurance determination. Key: [id number]

## 2023-02-04 NOTE — Telephone Encounter (Signed)
 This medication or product is on your plan's list of covered drugs. Prior authorization is not required at this time. If your pharmacy has questions regarding the processing of your prescription, please have them call the [facility] pharmacy help desk at [phone]. **Please note: This request was submitted electronically. Formulary lowering, tiering exception, cost reduction and/or pre-benefit determination review (including prospective Medicare hospice reviews) requests cannot be requested using this method of submission. Providers contact us at [phone] for further assistance.

## 2023-02-17 ENCOUNTER — Encounter (INDEPENDENT_AMBULATORY_CARE_PROVIDER_SITE_OTHER): Payer: Self-pay | Admitting: Family Medicine

## 2023-02-17 DIAGNOSIS — F3289 Other specified depressive episodes: Secondary | ICD-10-CM

## 2023-02-17 MED ORDER — ESCITALOPRAM OXALATE 20 MG PO TABS: 20.0000 mg | ORAL_TABLET | Freq: Every day | ORAL | 0 refills | Status: DC

## 2023-03-02 ENCOUNTER — Ambulatory Visit (INDEPENDENT_AMBULATORY_CARE_PROVIDER_SITE_OTHER): Payer: 59 | Admitting: Family Medicine

## 2023-03-02 ENCOUNTER — Encounter (INDEPENDENT_AMBULATORY_CARE_PROVIDER_SITE_OTHER): Payer: Self-pay | Admitting: Family Medicine

## 2023-03-02 VITALS — BP 132/86 | HR 76 | Temp 98.1°F | Ht 69.0 in | Wt 234.0 lb

## 2023-03-02 DIAGNOSIS — I10 Essential (primary) hypertension: Secondary | ICD-10-CM | POA: Diagnosis not present

## 2023-03-02 DIAGNOSIS — R7303 Prediabetes: Secondary | ICD-10-CM | POA: Diagnosis not present

## 2023-03-02 DIAGNOSIS — F3289 Other specified depressive episodes: Secondary | ICD-10-CM

## 2023-03-02 DIAGNOSIS — Z6834 Body mass index (BMI) 34.0-34.9, adult: Secondary | ICD-10-CM

## 2023-03-02 DIAGNOSIS — E669 Obesity, unspecified: Secondary | ICD-10-CM | POA: Diagnosis not present

## 2023-03-02 DIAGNOSIS — F5089 Other specified eating disorder: Secondary | ICD-10-CM | POA: Diagnosis not present

## 2023-03-02 MED ORDER — ESCITALOPRAM OXALATE 20 MG PO TABS: 20.0000 mg | ORAL_TABLET | Freq: Every day | ORAL | 0 refills | Status: DC

## 2023-03-02 MED ORDER — AMLODIPINE BESYLATE 10 MG PO TABS: 10.0000 mg | ORAL_TABLET | Freq: Every day | ORAL | 0 refills | Status: DC

## 2023-03-02 MED ORDER — METFORMIN HCL 500 MG PO TABS: 500.0000 mg | ORAL_TABLET | Freq: Every day | ORAL | 0 refills | Status: DC

## 2023-03-02 NOTE — Progress Notes (Signed)
 .smr  Office: [phone]  /  Fax: [phone]  WEIGHT SUMMARY AND BIOMETRICS  Anthropometric Measurements Height: 5' 9 (1.753 m) Weight: 234 lb (106.1 kg) BMI (Calculated): 34.54 Weight at Last Visit: 229 lb Weight Lost Since Last Visit: 0 Weight Gained Since Last Visit: 5 lb Starting Weight: 294 lb Total Weight Loss (lbs): 59 lb (26.8 kg)   Body Composition  Body Fat %: 44.9 % Fat Mass (lbs): 105.2 lbs Muscle Mass (lbs): 122.4 lbs Total Body Water (lbs): 87.2 lbs Visceral Fat Rating : 11   Other Clinical Data Fasting: No Labs: No Today's Visit #: 70 Starting Date: 06/24/17    Chief Complaint: OBESITY   History of Present Illness   Jennifer Frazier is a 48 year old female with obesity, prediabetes, and hypertension who presents for weight management and medication refills.  She has gained five pounds over the holidays, which she attributes to decreased exercise and holiday eating. She is working on resuming her journaling with a calorie goal of 1200 to 1500 and a protein goal of at least 85 grams per day, which she is achieving about 90% of the time. She is actively working on diet, exercise, and weight loss to manage her prediabetes, hypertension, and obesity.  She is currently on metformin  for her prediabetes and requests a refill. Her blood pressure is controlled at 132/86, and she requests a refill for her amlodipine . She is also on Lexapro  20 mg daily for emotional eating behaviors and requests a refill. No problems with the medication are reported.  Her exercise routine includes walking her dog Ava for 15 minutes and using a treadmill for 30 to 45 minutes, sometimes incorporating weights. She is considering incorporating more strength training exercises using wrist weights and resistance bands. She engages in strengthening and cardio exercises, spending 30 minutes five times per week.  Her dietary habits include consuming yogurt, cheese, and protein bars to meet  her protein goals. She experiences some fatigue with yogurt consumption and is exploring other protein sources. She mentions using eating plans that include pescatarian and vegetarian options to meet her dietary needs.          PHYSICAL EXAM:  Blood pressure 132/86, pulse 76, temperature 98.1 F (36.7 C), height 5' 9 (1.753 m), weight 234 lb (106.1 kg), SpO2 99%. Body mass index is 34.56 kg/m.  DIAGNOSTIC DATA REVIEWED:  BMET    Component Value Date/Time   NA 143 07/22/2022 0759   NA 140 11/04/2016 1500   K 4.2 07/22/2022 0759   K 3.8 11/04/2016 1500   CL 103 07/22/2022 0759   CO2 25 07/22/2022 0759   CO2 27 11/04/2016 1500   GLUCOSE 85 07/22/2022 0759   GLUCOSE 81 11/13/2019 1321   GLUCOSE 88 11/04/2016 1500   BUN 17 07/22/2022 0759   BUN 17.6 11/04/2016 1500   CREATININE 0.83 07/22/2022 0759   CREATININE 1.1 11/04/2016 1500   CALCIUM 9.9 07/22/2022 0759   CALCIUM 9.6 11/04/2016 1500   GFRNONAA 68 01/30/2020 1155   GFRNONAA >60 11/13/2019 1321   GFRAA 78 01/30/2020 1155   Lab Results  Component Value Date   HGBA1C 5.2 07/22/2022   HGBA1C 5.3 06/24/2017   Lab Results  Component Value Date   INSULIN  7.3 07/22/2022   INSULIN  6.2 06/24/2017   Lab Results  Component Value Date   TSH 1.65 09/04/2020   CBC    Component Value Date/Time   WBC 4.0 09/04/2020 0000   WBC 6.0 11/13/2019 1321  RBC 5.12 (A) 09/04/2020 0000   HGB 14.3 09/04/2020 0000   HGB 13.8 06/24/2017 1236   HGB 13.3 11/04/2016 1500   HCT 43 09/04/2020 0000   HCT 43.5 06/24/2017 1236   HCT 40.6 11/04/2016 1500   PLT 214 09/04/2020 0000   PLT 208 11/04/2016 1500   MCV 85.1 11/13/2019 1321   MCV 86 06/24/2017 1236   MCV 84.4 11/04/2016 1500   MCH 28.5 11/13/2019 1321   MCHC 33.5 11/13/2019 1321   RDW 13.0 11/13/2019 1321   RDW 14.8 06/24/2017 1236   RDW 13.9 11/04/2016 1500   Iron Studies No results found for: IRON, TIBC, FERRITIN, IRONPCTSAT Lipid Panel     Component Value  Date/Time   CHOL 148 09/05/2021 1519   TRIG 46 09/05/2021 1519   HDL 78 09/05/2021 1519   LDLCALC 60 09/05/2021 1519   Hepatic Function Panel     Component Value Date/Time   PROT 7.3 07/22/2022 0759   PROT 7.5 11/04/2016 1500   ALBUMIN 4.4 07/22/2022 0759   ALBUMIN 3.8 11/04/2016 1500   AST 19 07/22/2022 0759   AST 20 11/04/2016 1500   ALT 13 07/22/2022 0759   ALT 15 11/04/2016 1500   ALKPHOS 97 07/22/2022 0759   ALKPHOS 87 11/04/2016 1500   BILITOT 0.4 07/22/2022 0759   BILITOT 0.31 11/04/2016 1500      Component Value Date/Time   TSH 1.65 09/04/2020 0000   TSH 1.600 04/18/2019 1228   Nutritional Lab Results  Component Value Date   VD25OH 36.9 07/22/2022   VD25OH 49.8 09/05/2021   VD25OH 40.8 01/06/2021     Assessment and Plan    Obesity Recent weight gain of 5 pounds over the holidays. Actively working on diet and exercise, including journaling with a calorie goal of 1200-1500 and a protein goal of at least 85 grams per day. Engaging in strengthening and cardio exercises for 30 minutes, five times per week. Muscle mass increase contributing to weight gain. Emphasized structured journaling, avoiding guessing situations, and preferring natural protein sources over supplements. Encouraged use of ankle and wrist weights during walks and treadmill sessions. - Continue journaling with a calorie goal of 1200-1500 and a protein goal of at least 85 grams per day - Incorporate more structured strength training and cardio exercises - Use provided eating plans (category two, pescatarian, vegetarian) as guidelines - Consider using ankle and wrist weights during walks and treadmill sessions - Follow up in four weeks  Emotional Eating Managed with Lexapro  20 mg daily. No issues with current regimen. Discussed maintaining medication adherence and monitoring for changes in emotional eating behaviors. - Refill Lexapro   Prediabetes Managed with metformin  and lifestyle modifications  including diet and exercise. Discussed the importance of continued diet and exercise to manage blood glucose levels. - Refill metformin   Hypertension Controlled with amlodipine . Blood pressure today is 132/86. Emphasized the importance of diet, exercise, and weight loss in managing hypertension. - Refill amlodipine   Follow-up - Schedule follow-up appointment in four weeks.        She was informed of the importance of frequent follow up visits to maximize her success with intensive lifestyle modifications for her multiple health conditions.    Louann Penton, MD

## 2023-03-30 ENCOUNTER — Encounter (INDEPENDENT_AMBULATORY_CARE_PROVIDER_SITE_OTHER): Payer: Self-pay | Admitting: Family Medicine

## 2023-03-30 ENCOUNTER — Ambulatory Visit (INDEPENDENT_AMBULATORY_CARE_PROVIDER_SITE_OTHER): Payer: 59 | Admitting: Family Medicine

## 2023-03-30 VITALS — BP 134/82 | HR 70 | Temp 97.9°F | Ht 69.0 in | Wt 235.0 lb

## 2023-03-30 DIAGNOSIS — R7303 Prediabetes: Secondary | ICD-10-CM

## 2023-03-30 DIAGNOSIS — I10 Essential (primary) hypertension: Secondary | ICD-10-CM

## 2023-03-30 DIAGNOSIS — Z6834 Body mass index (BMI) 34.0-34.9, adult: Secondary | ICD-10-CM

## 2023-03-30 DIAGNOSIS — R5383 Other fatigue: Secondary | ICD-10-CM | POA: Diagnosis not present

## 2023-03-30 DIAGNOSIS — E669 Obesity, unspecified: Secondary | ICD-10-CM

## 2023-03-30 MED ORDER — AMLODIPINE BESYLATE 10 MG PO TABS: 10.0000 mg | ORAL_TABLET | Freq: Every day | ORAL | 0 refills | Status: DC

## 2023-03-30 MED ORDER — METFORMIN HCL 500 MG PO TABS: 500.0000 mg | ORAL_TABLET | Freq: Two times a day (BID) | ORAL | 0 refills | Status: DC

## 2023-03-30 NOTE — Progress Notes (Signed)
 Office: [phone]  /  Fax: [phone]  WEIGHT SUMMARY AND BIOMETRICS  Anthropometric Measurements Height: 5\' 9"  (1.753 m) Weight: 235 lb (106.6 kg) BMI (Calculated): 34.69 Weight at Last Visit: 234 lb Weight Lost Since Last Visit: 0 lb Weight Gained Since Last Visit: 1 lb Starting Weight: 294 lb Total Weight Loss (lbs): 58 lb (26.3 kg)   Body Composition  Body Fat %: 44.6 % Fat Mass (lbs): 104.8 lbs Muscle Mass (lbs): 123.6 lbs Total Body Water (lbs): 88.8 lbs Visceral Fat Rating : 11   Other Clinical Data Fasting: Yes Labs: No Today's Visit #: 53 Starting Date: 06/24/17    Chief Complaint: OBESITY    History of Present Illness   Jennifer Frazier is a 48 year old female with obesity, prediabetes, and hypertension who presents for obesity treatment plan and progress monitoring.  She has experienced a weight gain of one pound over the last month. She maintains a food journal with a goal of 1500 calories and 120 grams of protein daily, achieving this 90% of the time. Her exercise routine includes walking and strength training for 45 minutes, two to three times per week. She describes her exercise routine in detail, including walking on a treadmill and outdoors, and performing strength training exercises such as leg lifts and squats. She aims to increase her daily steps to 10,000 and incorporate daily strength training.  She has a history of prediabetes and is currently taking metformin 500 mg daily. She experiences increased hunger, particularly in the evening and at night, which she attributes to her current metformin regimen. Her hunger is manageable during weekends when she is less routine-bound.  She has a history of hypertension and is on amlodipine 10 mg daily. Her blood pressure is currently controlled at 134/82 mmHg. She requests a refill of her medication.  She reports fatigue, particularly with routine activities. She inquired about taking a B12 complex for  energy, but her previous B12 levels were high, indicating supplementation is unnecessary.          PHYSICAL EXAM:  Blood pressure 134/82, pulse 70, temperature 97.9 F (36.6 C), height 5\' 9"  (1.753 m), weight 235 lb (106.6 kg), SpO2 100%. Body mass index is 34.7 kg/m.  DIAGNOSTIC DATA REVIEWED:  BMET    Component Value Date/Time   NA 143 07/22/2022 0759   NA 140 11/04/2016 1500   K 4.2 07/22/2022 0759   K 3.8 11/04/2016 1500   CL 103 07/22/2022 0759   CO2 25 07/22/2022 0759   CO2 27 11/04/2016 1500   GLUCOSE 85 07/22/2022 0759   GLUCOSE 81 11/13/2019 1321   GLUCOSE 88 11/04/2016 1500   BUN 17 07/22/2022 0759   BUN 17.6 11/04/2016 1500   CREATININE 0.83 07/22/2022 0759   CREATININE 1.1 11/04/2016 1500   CALCIUM 9.9 07/22/2022 0759   CALCIUM 9.6 11/04/2016 1500   GFRNONAA 68 01/30/2020 1155   GFRNONAA >60 11/13/2019 1321   GFRAA 78 01/30/2020 1155   Lab Results  Component Value Date   HGBA1C 5.2 07/22/2022   HGBA1C 5.3 06/24/2017   Lab Results  Component Value Date   INSULIN 7.3 07/22/2022   INSULIN 6.2 06/24/2017   Lab Results  Component Value Date   TSH 1.65 09/04/2020   CBC    Component Value Date/Time   WBC 4.0 09/04/2020 0000   WBC 6.0 11/13/2019 1321   RBC 5.12 (A) 09/04/2020 0000   HGB 14.3 09/04/2020 0000   HGB 13.8 06/24/2017 1236  HGB 13.3 11/04/2016 1500   HCT 43 09/04/2020 0000   HCT 43.5 06/24/2017 1236   HCT 40.6 11/04/2016 1500   PLT 214 09/04/2020 0000   PLT 208 11/04/2016 1500   MCV 85.1 11/13/2019 1321   MCV 86 06/24/2017 1236   MCV 84.4 11/04/2016 1500   MCH 28.5 11/13/2019 1321   MCHC 33.5 11/13/2019 1321   RDW 13.0 11/13/2019 1321   RDW 14.8 06/24/2017 1236   RDW 13.9 11/04/2016 1500   Iron Studies No results found for: "IRON", "TIBC", "FERRITIN", "IRONPCTSAT" Lipid Panel     Component Value Date/Time   CHOL 148 09/05/2021 1519   TRIG 46 09/05/2021 1519   HDL 78 09/05/2021 1519   LDLCALC 60 09/05/2021 1519    Hepatic Function Panel     Component Value Date/Time   PROT 7.3 07/22/2022 0759   PROT 7.5 11/04/2016 1500   ALBUMIN 4.4 07/22/2022 0759   ALBUMIN 3.8 11/04/2016 1500   AST 19 07/22/2022 0759   AST 20 11/04/2016 1500   ALT 13 07/22/2022 0759   ALT 15 11/04/2016 1500   ALKPHOS 97 07/22/2022 0759   ALKPHOS 87 11/04/2016 1500   BILITOT 0.4 07/22/2022 0759   BILITOT 0.31 11/04/2016 1500      Component Value Date/Time   TSH 1.65 09/04/2020 0000   TSH 1.600 04/18/2019 1228   Nutritional Lab Results  Component Value Date   VD25OH 36.9 07/22/2022   VD25OH 49.8 09/05/2021   VD25OH 40.8 01/06/2021     Assessment and Plan    Obesity Patient has gained one pound in the last month but shows a decrease in fat and an increase in muscle mass. Adhering to a 1500 calorie and 120 gram protein diet 90% of the time and engaging in walking and strength training exercises for 45 minutes, 2-3 times per week. Discussed setting smaller, achievable goals for exercise and incorporating stair exercises for lower body strength training. - Continue current diet and exercise regimen. - Encourage setting smaller, achievable goals for exercise. - Incorporate stair exercises for lower body strength training.  Prediabetes Currently on metformin 500 mg daily. Reports increased hunger, especially in the evening and at night, possibly due to insufficient coverage from the current dose. Proposed adding a second dose to cover the second half of the day. Explained that metformin can be taken with or without food but is less likely to cause stomach upset if taken with food. - Increase metformin to 500 mg twice daily, with the second dose taken at least four hours after breakfast. - Send in a new prescription for the increased number of metformin pills.  Hypertension On amlodipine 10 mg daily. Blood pressure is well-controlled today at 134/82 mmHg. Discussed the goal of potentially discontinuing amlodipine in  the future if blood pressure remains well-controlled. - Refill amlodipine 10 mg daily. - Continue monitoring blood pressure with the goal of potentially discontinuing amlodipine in the future.  Fatigue Reports occasional fatigue, particularly related to routine activities. Inquired about taking a B12 complex for energy. Previous B12 levels were high, indicating no deficiency. Explained that supplementing B12 when levels are already high is unlikely to provide additional benefits and may be a placebo effect. - Advise against taking B12 complex due to high B12 levels. - Encourage non-pharmacological methods to boost energy, such as listening to upbeat music and varying daily routines.  General Health Maintenance Inquired about taking a B12 complex and biotin. Previous B12 levels were high, and she is currently taking  biotin for hair and nails. - Advise continuing biotin supplementation as desired.  Follow-up - Schedule the next two appointments. - Plan to perform blood work at the next appointment if it can be scheduled early enough for fasting.          She was informed of the importance of frequent follow up visits to maximize her success with intensive lifestyle modifications for her multiple health conditions.    Quillian Quince, MD

## 2023-05-04 ENCOUNTER — Ambulatory Visit (INDEPENDENT_AMBULATORY_CARE_PROVIDER_SITE_OTHER): Admitting: Family Medicine

## 2023-05-04 ENCOUNTER — Encounter (INDEPENDENT_AMBULATORY_CARE_PROVIDER_SITE_OTHER): Payer: Self-pay | Admitting: Family Medicine

## 2023-05-04 VITALS — BP 119/81 | HR 67 | Temp 98.2°F | Ht 69.0 in | Wt 234.0 lb

## 2023-05-04 DIAGNOSIS — R7303 Prediabetes: Secondary | ICD-10-CM

## 2023-05-04 DIAGNOSIS — E669 Obesity, unspecified: Secondary | ICD-10-CM

## 2023-05-04 DIAGNOSIS — I10 Essential (primary) hypertension: Secondary | ICD-10-CM | POA: Diagnosis not present

## 2023-05-04 DIAGNOSIS — E559 Vitamin D deficiency, unspecified: Secondary | ICD-10-CM | POA: Diagnosis not present

## 2023-05-04 DIAGNOSIS — F3289 Other specified depressive episodes: Secondary | ICD-10-CM

## 2023-05-04 DIAGNOSIS — F5089 Other specified eating disorder: Secondary | ICD-10-CM | POA: Diagnosis not present

## 2023-05-04 DIAGNOSIS — Z6834 Body mass index (BMI) 34.0-34.9, adult: Secondary | ICD-10-CM

## 2023-05-04 MED ORDER — ESCITALOPRAM OXALATE 20 MG PO TABS
20.0000 mg | ORAL_TABLET | Freq: Every day | ORAL | 0 refills | Status: DC
Start: 2023-05-04 — End: 2023-06-01

## 2023-05-04 MED ORDER — METFORMIN HCL 500 MG PO TABS: 500.0000 mg | ORAL_TABLET | Freq: Two times a day (BID) | ORAL | 0 refills | Status: DC

## 2023-05-04 MED ORDER — AMLODIPINE BESYLATE 10 MG PO TABS: 10.0000 mg | ORAL_TABLET | Freq: Every day | ORAL | 0 refills | Status: DC

## 2023-05-04 NOTE — Progress Notes (Signed)
 Office: [phone]  /  Fax: [phone]  WEIGHT SUMMARY AND BIOMETRICS  Anthropometric Measurements Height: 5\' 9"  (1.753 m) Weight: 234 lb (106.1 kg) BMI (Calculated): 34.54 Weight at Last Visit: 235 lb Weight Lost Since Last Visit: 1 lb Weight Gained Since Last Visit: 0 Starting Weight: 294 lb Total Weight Loss (lbs): 59 lb (26.8 kg)   Body Composition  Body Fat %: 44.3 % Fat Mass (lbs): 104 lbs Muscle Mass (lbs): 124 lbs Total Body Water (lbs): 86.6 lbs Visceral Fat Rating : 11   Other Clinical Data Fasting: no Labs: no Today's Visit #: 90 Starting Date: 06/24/17    Chief Complaint: OBESITY    History of Present Illness The patient presents to discuss her obesity treatment plan and assess her progress.  She is maintaining a food journal with a goal of 1500 calories and 120 grams of protein daily, achieving this about 25% of the time. She has not exercised for the last two weeks but has lost one pound since her last visit a month ago.  The first two weeks of the month were successful in following her diet and exercise plan, but the last two weeks were disrupted by a dental issue. She had a tooth removed, requiring an oral surgeon due to long roots, leading to tenderness and a diet of soft foods like mashed potatoes and macaroni, impacting her dietary plan and exercise routine.  She describes a different hunger sensation during the first two weeks compared to the last two weeks. Initially, she consumed oatmeal for breakfast and light meals like soup or smoothies for lunch, experiencing a hunger that did not compel her to eat more.  She has a history of hypertension, currently managed with amlodipine 10 mg daily, with controlled blood pressure at 119/81. She is also on Lexapro for emotional eating behaviors and metformin for prediabetes, taken twice daily. She sometimes struggles to remember the second dose but has been more structured with her medication schedule  recently.  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 She manages stress and emotional well-being by engaging in activities that make her happy, such as listening to music and taking naps when possible.      PHYSICAL EXAM:  Blood pressure 119/81, pulse 67, temperature 98.2 F (36.8 C), height 5\' 9"  (1.753 m), weight 234 lb (106.1 kg), SpO2 99%. Body mass index is 34.56 kg/m.  DIAGNOSTIC DATA REVIEWED:  BMET    Component Value Date/Time   NA 143 07/22/2022 0759   NA 140 11/04/2016 1500   K 4.2 07/22/2022 0759   K 3.8 11/04/2016 1500   CL 103 07/22/2022 0759   CO2 25 07/22/2022 0759   CO2 27 11/04/2016 1500   GLUCOSE 85 07/22/2022 0759   GLUCOSE 81 11/13/2019 1321   GLUCOSE 88 11/04/2016 1500   BUN 17 07/22/2022 0759   BUN 17.6 11/04/2016 1500   CREATININE 0.83 07/22/2022 0759   CREATININE 1.1 11/04/2016 1500   CALCIUM 9.9 07/22/2022 0759   CALCIUM 9.6 11/04/2016 1500   GFRNONAA 68 01/30/2020 1155   GFRNONAA >60 11/13/2019 1321   GFRAA 78 01/30/2020 1155   Lab Results  Component Value Date   HGBA1C 5.2 07/22/2022   HGBA1C 5.3 06/24/2017   Lab Results  Component Value Date   INSULIN 7.3 07/22/2022   INSULIN 6.2 06/24/2017   Lab Results  Component Value Date   TSH 1.65 09/04/2020  CBC    Component Value Date/Time   WBC 4.0 09/04/2020 0000   WBC 6.0 11/13/2019 1321   RBC 5.12 (A) 09/04/2020 0000   HGB 14.3 09/04/2020 0000   HGB 13.8 06/24/2017 1236   HGB 13.3 11/04/2016 1500   HCT 43 09/04/2020 0000   HCT 43.5 06/24/2017 1236   HCT 40.6 11/04/2016 1500   PLT 214 09/04/2020 0000   PLT 208 11/04/2016 1500   MCV 85.1 11/13/2019 1321   MCV 86 06/24/2017 1236   MCV 84.4 11/04/2016 1500   MCH 28.5 11/13/2019 1321   MCHC 33.5 11/13/2019 1321   RDW 13.0 11/13/2019 1321   RDW 14.8 06/24/2017 1236   RDW 13.9 11/04/2016 1500   Iron Studies No results found for: "IRON", "TIBC",  "FERRITIN", "IRONPCTSAT" Lipid Panel     Component Value Date/Time   CHOL 148 09/05/2021 1519   TRIG 46 09/05/2021 1519   HDL 78 09/05/2021 1519   LDLCALC 60 09/05/2021 1519   Hepatic Function Panel     Component Value Date/Time   PROT 7.3 07/22/2022 0759   PROT 7.5 11/04/2016 1500   ALBUMIN 4.4 07/22/2022 0759   ALBUMIN 3.8 11/04/2016 1500   AST 19 07/22/2022 0759   AST 20 11/04/2016 1500   ALT 13 07/22/2022 0759   ALT 15 11/04/2016 1500   ALKPHOS 97 07/22/2022 0759   ALKPHOS 87 11/04/2016 1500   BILITOT 0.4 07/22/2022 0759   BILITOT 0.31 11/04/2016 1500      Component Value Date/Time   TSH 1.65 09/04/2020 0000   TSH 1.600 04/18/2019 1228   Nutritional Lab Results  Component Value Date   VD25OH 36.9 07/22/2022   VD25OH 49.8 09/05/2021   VD25OH 40.8 01/06/2021     Assessment and Plan Assessment & Plan Obesity She is working on a weight loss plan with a goal of 1500 calories and 120 grams of protein daily, achieving success 25% of the time. She has not exercised in the last two weeks due to oral surgery but has lost one pound in the last month. Emphasized the importance of maintaining protein intake to prevent metabolic slowdown. Provided a high-protein dessert recipe to help meet her goals. Discussed the importance of finding enjoyable foods that do not hinder progress and the concept of practicing maintenance when indulging occasionally. - Continue current diet and exercise plan with a focus on protein intake - Incorporate high-protein dessert recipe into diet - Encourage finding enjoyable, low-calorie, high-protein foods - Practice maintenance when indulging occasionally  Emotional Eating Behavior She is on Lexapro to help manage emotional eating behaviors, especially given the stressors she is experiencing. Emphasized the importance of continuing this medication to support her weight management efforts. - Refill Lexapro  Prediabetes She is on metformin for  prediabetes and has been making efforts to take it twice daily, especially after oral surgery. Emphasized the importance of maintaining this regimen to manage her condition. - Refill metformin - Continue taking metformin twice daily  Continue current diet and exercise plan with a focus on protein intake  Hypertension Her hypertension is well-controlled with amlodipine 10 mg, with a current blood pressure reading of 119/81 mmHg. - Refill amlodipine 10 mg  Continue current diet and exercise plan with a focus on protein intake  Vit D deficiency Due for labs - Check vit D level and follow  General Health Maintenance She is due for routine blood work as her last labs were done in June. She is currently fasting, making it an  opportune time to perform the tests. - Order routine blood work  Follow-up She is to follow up in four weeks to assess progress and review lab results. - Schedule follow-up appointment in four weeks   She was informed of the importance of frequent follow up visits to maximize her success with intensive lifestyle modifications for her multiple health conditions.    Quillian Quince, MD

## 2023-05-05 LAB — LIPID PANEL WITH LDL/HDL RATIO
Cholesterol, Total: 174 mg/dL (ref 100–199)
HDL: 92 mg/dL (ref >39)
LDL Chol Calc (NIH): 72 mg/dL (ref 0–99)
LDL/HDL Ratio: 0.8 ratio (ref 0.0–3.2)
Triglycerides: 49 mg/dL (ref 0–149)
VLDL Cholesterol Cal: 10 mg/dL (ref 5–40)

## 2023-05-05 LAB — CMP14+EGFR
ALT: 11 IU/L (ref 0–32)
AST: 16 IU/L (ref 0–40)
Albumin: 4.5 g/dL (ref 3.9–4.9)
Alkaline Phosphatase: 104 IU/L (ref 44–121)
BUN/Creatinine Ratio: 20 (ref 9–23)
BUN: 18 mg/dL (ref 6–24)
Bilirubin Total: 0.3 mg/dL (ref 0.0–1.2)
CO2: 23 mmol/L (ref 20–29)
Calcium: 10.1 mg/dL (ref 8.7–10.2)
Chloride: 102 mmol/L (ref 96–106)
Creatinine, Ser: 0.91 mg/dL (ref 0.57–1.00)
Globulin, Total: 3.1 g/dL (ref 1.5–4.5)
Glucose: 81 mg/dL (ref 70–99)
Potassium: 4 mmol/L (ref 3.5–5.2)
Sodium: 142 mmol/L (ref 134–144)
Total Protein: 7.6 g/dL (ref 6.0–8.5)
eGFR: 77 mL/min/{1.73_m2} (ref >59)

## 2023-05-05 LAB — INSULIN, RANDOM: INSULIN: 6.9 u[IU]/mL (ref 2.6–24.9)

## 2023-05-05 LAB — VITAMIN B12: Vitamin B-12: 1302 pg/mL — ABNORMAL HIGH (ref 232–1245)

## 2023-05-05 LAB — VITAMIN D 25 HYDROXY (VIT D DEFICIENCY, FRACTURES): Vit D, 25-Hydroxy: 35.6 ng/mL (ref 30.0–100.0)

## 2023-05-05 LAB — HEMOGLOBIN A1C
Est. average glucose Bld gHb Est-mCnc: 103 mg/dL
Hgb A1c MFr Bld: 5.2 % (ref 4.8–5.6)

## 2023-05-07 ENCOUNTER — Other Ambulatory Visit (INDEPENDENT_AMBULATORY_CARE_PROVIDER_SITE_OTHER): Payer: Self-pay | Admitting: Family Medicine

## 2023-05-07 DIAGNOSIS — I10 Essential (primary) hypertension: Secondary | ICD-10-CM

## 2023-05-24 ENCOUNTER — Other Ambulatory Visit (INDEPENDENT_AMBULATORY_CARE_PROVIDER_SITE_OTHER): Payer: Self-pay | Admitting: Family Medicine

## 2023-05-24 DIAGNOSIS — F3289 Other specified depressive episodes: Secondary | ICD-10-CM

## 2023-06-01 ENCOUNTER — Ambulatory Visit (INDEPENDENT_AMBULATORY_CARE_PROVIDER_SITE_OTHER): Admitting: Family Medicine

## 2023-06-01 ENCOUNTER — Encounter (INDEPENDENT_AMBULATORY_CARE_PROVIDER_SITE_OTHER): Payer: Self-pay | Admitting: Family Medicine

## 2023-06-01 VITALS — BP 121/82 | HR 73 | Temp 98.1°F | Ht 69.0 in | Wt 235.0 lb

## 2023-06-01 DIAGNOSIS — F5089 Other specified eating disorder: Secondary | ICD-10-CM | POA: Diagnosis not present

## 2023-06-01 DIAGNOSIS — E559 Vitamin D deficiency, unspecified: Secondary | ICD-10-CM | POA: Diagnosis not present

## 2023-06-01 DIAGNOSIS — I10 Essential (primary) hypertension: Secondary | ICD-10-CM | POA: Diagnosis not present

## 2023-06-01 DIAGNOSIS — F3289 Other specified depressive episodes: Secondary | ICD-10-CM

## 2023-06-01 DIAGNOSIS — E669 Obesity, unspecified: Secondary | ICD-10-CM

## 2023-06-01 DIAGNOSIS — Z6834 Body mass index (BMI) 34.0-34.9, adult: Secondary | ICD-10-CM

## 2023-06-01 MED ORDER — ESCITALOPRAM OXALATE 20 MG PO TABS: 20.0000 mg | ORAL_TABLET | Freq: Every day | ORAL | 0 refills | Status: DC

## 2023-06-01 MED ORDER — AMLODIPINE BESYLATE 10 MG PO TABS: 10.0000 mg | ORAL_TABLET | Freq: Every day | ORAL | 0 refills | Status: DC

## 2023-06-01 MED ORDER — VITAMIN D (ERGOCALCIFEROL) 1.25 MG (50000 UNIT) PO CAPS
50000.0000 [IU] | ORAL_CAPSULE | ORAL | 0 refills | Status: DC
Start: 2023-06-01 — End: 2023-06-29

## 2023-06-01 NOTE — Progress Notes (Signed)
 Office: [phone]  /  Fax: [phone]  WEIGHT SUMMARY AND BIOMETRICS  Anthropometric Measurements Height: 5\' 9"  (1.753 m) Weight: 235 lb (106.6 kg) BMI (Calculated): 34.69 Weight at Last Visit: 234 lb Weight Lost Since Last Visit: 0 Weight Gained Since Last Visit: 1 lb Starting Weight: 294 lb Total Weight Loss (lbs): 58 lb (26.3 kg)   Body Composition  Body Fat %: 44.7 % Fat Mass (lbs): 105.4 lbs Muscle Mass (lbs): 123.6 lbs Total Body Water (lbs): 87.8 lbs Visceral Fat Rating : 11   Other Clinical Data Fasting: No Labs: No Today's Visit #: 91 Starting Date: 06/24/17    Chief Complaint: OBESITY   History of Present Illness Jennifer Frazier is a 48 year old female who presents for a follow-up on her obesity treatment plan.  She is following a journaling plan with a goal of 1500 calories and 120 grams of protein daily, adhering to this plan about 50% of the time. She is not currently exercising and has gained one pound in the last month since her last visit. She experiences variable hunger levels, often not feeling hungry on weekends when not at work, sometimes skipping meals, and consuming only low-calorie cold brew coffee until dinner, leading to hunger at bedtime.  She has a history of hypertension and is currently taking amlodipine  10 mg daily. She requests a refill of her medication.  She has a history of emotional eating behaviors and is taking Lexapro  20 mg daily to manage this. She requests a refill of this medication as well. No side effects from Lexapro  such as dry mouth or decreased libido. She experiences a sensation similar to spinning around too fast, which is a version of lightheadedness.  She mentions a history of high B12 levels, which have been elevated since last year. She has stopped taking biotin gummies, which she previously consumed daily at a dose of 10,000 mcg.      PHYSICAL EXAM:  Blood pressure 121/82, pulse 73, temperature 98.1 F  (36.7 C), height 5\' 9"  (1.753 m), weight 235 lb (106.6 kg), SpO2 98%. Body mass index is 34.7 kg/m.  DIAGNOSTIC DATA REVIEWED:  BMET    Component Value Date/Time   NA 142 05/04/2023 1117   NA 140 11/04/2016 1500   K 4.0 05/04/2023 1117   K 3.8 11/04/2016 1500   CL 102 05/04/2023 1117   CO2 23 05/04/2023 1117   CO2 27 11/04/2016 1500   GLUCOSE 81 05/04/2023 1117   GLUCOSE 81 11/13/2019 1321   GLUCOSE 88 11/04/2016 1500   BUN 18 05/04/2023 1117   BUN 17.6 11/04/2016 1500   CREATININE 0.91 05/04/2023 1117   CREATININE 1.1 11/04/2016 1500   CALCIUM 10.1 05/04/2023 1117   CALCIUM 9.6 11/04/2016 1500   GFRNONAA 68 01/30/2020 1155   GFRNONAA >60 11/13/2019 1321   GFRAA 78 01/30/2020 1155   Lab Results  Component Value Date   HGBA1C 5.2 05/04/2023   HGBA1C 5.3 06/24/2017   Lab Results  Component Value Date   INSULIN  6.9 05/04/2023   INSULIN  6.2 06/24/2017   Lab Results  Component Value Date   TSH 1.65 09/04/2020   CBC    Component Value Date/Time   WBC 4.0 09/04/2020 0000   WBC 6.0 11/13/2019 1321   RBC 5.12 (A) 09/04/2020 0000   HGB 14.3 09/04/2020 0000   HGB 13.8 06/24/2017 1236   HGB 13.3 11/04/2016 1500   HCT 43 09/04/2020 0000   HCT 43.5 06/24/2017 1236   HCT  40.6 11/04/2016 1500   PLT 214 09/04/2020 0000   PLT 208 11/04/2016 1500   MCV 85.1 11/13/2019 1321   MCV 86 06/24/2017 1236   MCV 84.4 11/04/2016 1500   MCH 28.5 11/13/2019 1321   MCHC 33.5 11/13/2019 1321   RDW 13.0 11/13/2019 1321   RDW 14.8 06/24/2017 1236   RDW 13.9 11/04/2016 1500   Iron Studies No results found for: "IRON", "TIBC", "FERRITIN", "IRONPCTSAT" Lipid Panel     Component Value Date/Time   CHOL 174 05/04/2023 1117   TRIG 49 05/04/2023 1117   HDL 92 05/04/2023 1117   LDLCALC 72 05/04/2023 1117   Hepatic Function Panel     Component Value Date/Time   PROT 7.6 05/04/2023 1117   PROT 7.5 11/04/2016 1500   ALBUMIN 4.5 05/04/2023 1117   ALBUMIN 3.8 11/04/2016 1500   AST  16 05/04/2023 1117   AST 20 11/04/2016 1500   ALT 11 05/04/2023 1117   ALT 15 11/04/2016 1500   ALKPHOS 104 05/04/2023 1117   ALKPHOS 87 11/04/2016 1500   BILITOT 0.3 05/04/2023 1117   BILITOT 0.31 11/04/2016 1500      Component Value Date/Time   TSH 1.65 09/04/2020 0000   TSH 1.600 04/18/2019 1228   Nutritional Lab Results  Component Value Date   VD25OH 35.6 05/04/2023   VD25OH 36.9 07/22/2022   VD25OH 49.8 09/05/2021     Assessment and Plan Assessment & Plan Obesity Following a dietary plan of 1500 calories and 120 grams of protein, achieving this about 50% of the time. No current exercise regimen. Weight has increased by 1 pound, likely due to water weight. Not consistently journaling dietary intake. Discussed the importance of routine in managing hunger and dietary intake. Reinforced the benefits of tracking dietary intake for weight loss, citing a study where trackers lost three times more weight than non-trackers. - Encourage strict journaling of dietary intake to meet 1500 calorie and 100-120 grams of protein goals - Follow up in 4 weeks to assess progress  Emotional eating behaviors On Lexapro  20 mg daily for emotional eating behaviors. No significant side effects reported, except occasional lightheadedness, likely due to dehydration. Discussed the importance of hydration, especially during active weight loss. - Refill Lexapro  20 mg daily - Encourage increased hydration to address lightheadedness  Hypertension Blood pressure is well-controlled at 121/82 mmHg. She is on amlodipine  10 mg daily with no concerns regarding the current medication regimen. - Refill amlodipine  10 mg daily  Vitamin D  deficiency Vitamin D  level is in the mid-30s, below the target of 50. Previously on daily vitamin D  supplementation. Discussed that vitamin D  is hard to increase and easier to fall, hence the need for prescription supplementation. - Prescribe weekly vitamin D  supplementation -  Recheck vitamin D  levels in 3-4 months   She was informed of the importance of frequent follow up visits to maximize her success with intensive lifestyle modifications for her multiple health conditions.    Jasmine Mesi, MD

## 2023-06-29 ENCOUNTER — Encounter (INDEPENDENT_AMBULATORY_CARE_PROVIDER_SITE_OTHER): Payer: Self-pay | Admitting: Family Medicine

## 2023-06-29 ENCOUNTER — Ambulatory Visit (INDEPENDENT_AMBULATORY_CARE_PROVIDER_SITE_OTHER): Admitting: Family Medicine

## 2023-06-29 VITALS — BP 120/83 | HR 71 | Temp 98.2°F | Ht 69.0 in | Wt 240.0 lb

## 2023-06-29 DIAGNOSIS — I10 Essential (primary) hypertension: Secondary | ICD-10-CM | POA: Diagnosis not present

## 2023-06-29 DIAGNOSIS — Z6835 Body mass index (BMI) 35.0-35.9, adult: Secondary | ICD-10-CM

## 2023-06-29 DIAGNOSIS — E669 Obesity, unspecified: Secondary | ICD-10-CM

## 2023-06-29 DIAGNOSIS — H1011 Acute atopic conjunctivitis, right eye: Secondary | ICD-10-CM

## 2023-06-29 DIAGNOSIS — F5089 Other specified eating disorder: Secondary | ICD-10-CM

## 2023-06-29 DIAGNOSIS — E559 Vitamin D deficiency, unspecified: Secondary | ICD-10-CM

## 2023-06-29 DIAGNOSIS — F3289 Other specified depressive episodes: Secondary | ICD-10-CM

## 2023-06-29 DIAGNOSIS — Z6841 Body Mass Index (BMI) 40.0 and over, adult: Secondary | ICD-10-CM

## 2023-06-29 DIAGNOSIS — Z6834 Body mass index (BMI) 34.0-34.9, adult: Secondary | ICD-10-CM

## 2023-06-29 DIAGNOSIS — R7303 Prediabetes: Secondary | ICD-10-CM | POA: Diagnosis not present

## 2023-06-29 DIAGNOSIS — H1010 Acute atopic conjunctivitis, unspecified eye: Secondary | ICD-10-CM

## 2023-06-29 MED ORDER — VITAMIN D (ERGOCALCIFEROL) 1.25 MG (50000 UNIT) PO CAPS
50000.0000 [IU] | ORAL_CAPSULE | ORAL | 0 refills | Status: DC
Start: 2023-06-29 — End: 2023-07-27

## 2023-06-29 MED ORDER — ESCITALOPRAM OXALATE 20 MG PO TABS: 20.0000 mg | ORAL_TABLET | Freq: Every day | ORAL | 0 refills | Status: DC

## 2023-06-29 MED ORDER — AMLODIPINE BESYLATE 10 MG PO TABS: 10.0000 mg | ORAL_TABLET | Freq: Every day | ORAL | 0 refills | Status: DC

## 2023-06-29 MED ORDER — METFORMIN HCL ER 750 MG PO TB24: 750.0000 mg | ORAL_TABLET | Freq: Every day | ORAL | 0 refills | Status: DC

## 2023-06-29 NOTE — Progress Notes (Signed)
 Office: [phone]  /  Fax: [phone]  WEIGHT SUMMARY AND BIOMETRICS  Anthropometric Measurements Height: 5\' 9"  (1.753 m) Weight: 240 lb (108.9 kg) BMI (Calculated): 35.43 Weight at Last Visit: 235 lb Weight Lost Since Last Visit: 0 Weight Gained Since Last Visit: 5 lb Starting Weight: 294 lb Total Weight Loss (lbs): 54 lb (24.5 kg) Peak Weight: 294 lb   Body Composition  Body Fat %: 46.1 % Fat Mass (lbs): 110.8 lbs Muscle Mass (lbs): 122.8 lbs Total Body Water (lbs): 90.6 lbs Visceral Fat Rating : 12   Other Clinical Data Fasting: yes Labs: no Today's Visit #: 92 Starting Date: 06/24/17    Chief Complaint: OBESITY    History of Present Illness Jennifer Frazier is a 48 year old female who presents for obesity treatment.  She is following a prescribed diet plan of 1500 calories and 100 or more grams of protein, adhering to it 80% of the time. Despite this, she has gained five pounds in the last month. She exercises for 20 minutes five times a week. She experiences emotional eating behaviors and is on Lexapro  for this condition.  She has a history of prediabetes and is currently taking metformin . She reports inconsistency with the second dose of metformin  due to her schedule, often missing it because of meal timing issues.  She has hypertension and is on Norvasc . Her blood pressure is well-controlled at 120/83 mmHg.  She has vitamin D  deficiency and is taking vitamin D  supplements.  She reports experiencing fluid retention, noting an episode of frequent urination over a night and some swelling in her foot.  She has a history of retinal tears and has undergone laser treatment. Recently, she experienced eye discomfort and was prescribed a steroid drop and Pataday for allergies, which she completed. She reports persistent symptoms in one eye.      PHYSICAL EXAM:  Blood pressure 120/83, pulse 71, temperature 98.2 F (36.8 C), height 5\' 9"  (1.753 m), weight 240  lb (108.9 kg), SpO2 99%. Body mass index is 35.44 kg/m.  DIAGNOSTIC DATA REVIEWED:  BMET    Component Value Date/Time   NA 142 05/04/2023 1117   NA 140 11/04/2016 1500   K 4.0 05/04/2023 1117   K 3.8 11/04/2016 1500   CL 102 05/04/2023 1117   CO2 23 05/04/2023 1117   CO2 27 11/04/2016 1500   GLUCOSE 81 05/04/2023 1117   GLUCOSE 81 11/13/2019 1321   GLUCOSE 88 11/04/2016 1500   BUN 18 05/04/2023 1117   BUN 17.6 11/04/2016 1500   CREATININE 0.91 05/04/2023 1117   CREATININE 1.1 11/04/2016 1500   CALCIUM 10.1 05/04/2023 1117   CALCIUM 9.6 11/04/2016 1500   GFRNONAA 68 01/30/2020 1155   GFRNONAA >60 11/13/2019 1321   GFRAA 78 01/30/2020 1155   Lab Results  Component Value Date   HGBA1C 5.2 05/04/2023   HGBA1C 5.3 06/24/2017   Lab Results  Component Value Date   INSULIN  6.9 05/04/2023   INSULIN  6.2 06/24/2017   Lab Results  Component Value Date   TSH 1.65 09/04/2020   CBC    Component Value Date/Time   WBC 4.0 09/04/2020 0000   WBC 6.0 11/13/2019 1321   RBC 5.12 (A) 09/04/2020 0000   HGB 14.3 09/04/2020 0000   HGB 13.8 06/24/2017 1236   HGB 13.3 11/04/2016 1500   HCT 43 09/04/2020 0000   HCT 43.5 06/24/2017 1236   HCT 40.6 11/04/2016 1500   PLT 214 09/04/2020 0000   PLT  208 11/04/2016 1500   MCV 85.1 11/13/2019 1321   MCV 86 06/24/2017 1236   MCV 84.4 11/04/2016 1500   MCH 28.5 11/13/2019 1321   MCHC 33.5 11/13/2019 1321   RDW 13.0 11/13/2019 1321   RDW 14.8 06/24/2017 1236   RDW 13.9 11/04/2016 1500   Iron Studies No results found for: "IRON", "TIBC", "FERRITIN", "IRONPCTSAT" Lipid Panel     Component Value Date/Time   CHOL 174 05/04/2023 1117   TRIG 49 05/04/2023 1117   HDL 92 05/04/2023 1117   LDLCALC 72 05/04/2023 1117   Hepatic Function Panel     Component Value Date/Time   PROT 7.6 05/04/2023 1117   PROT 7.5 11/04/2016 1500   ALBUMIN 4.5 05/04/2023 1117   ALBUMIN 3.8 11/04/2016 1500   AST 16 05/04/2023 1117   AST 20 11/04/2016 1500    ALT 11 05/04/2023 1117   ALT 15 11/04/2016 1500   ALKPHOS 104 05/04/2023 1117   ALKPHOS 87 11/04/2016 1500   BILITOT 0.3 05/04/2023 1117   BILITOT 0.31 11/04/2016 1500      Component Value Date/Time   TSH 1.65 09/04/2020 0000   TSH 1.600 04/18/2019 1228   Nutritional Lab Results  Component Value Date   VD25OH 35.6 05/04/2023   VD25OH 36.9 07/22/2022   VD25OH 49.8 09/05/2021     Assessment and Plan Assessment & Plan Obesity She is adhering to a 1500-calorie, 100-gram protein diet 80% of the time and exercises 20 minutes five times weekly. Despite these efforts, she gained five pounds last month, with half attributed to water retention. The weight gain may be due to fluid retention and actual weight gain. Emphasized learning from past experiences and strategizing to regain progress. - Encourage journaling to monitor food intake - Implement structured meal planning and preparation - Continue current exercise regimen  Emotional Eating Behaviors She is on Lexapro  for emotional eating behaviors and will continue the current medication regimen. - Continue Lexapro  for emotional eating behaviors  Prediabetes She is on metformin  for prediabetes but is inconsistent with the second dose due to lifestyle constraints. Proposed changing from 500 mg twice daily to 750 mg once daily to improve compliance, acknowledging it is less effective than two doses but better than missing doses. - Change metformin  dosage from 500 mg twice daily to 750 mg once daily - Discontinue current 500 mg metformin  regimen and start new dosage as soon as possible  Hypertension She is on Norvasc  for hypertension, with blood pressure well-controlled at 120/83 mmHg. - Continue Norvasc  for hypertension management  Vitamin D  Deficiency She is on vitamin D  supplementation, with plans to continue until the end of summer and recheck levels then. - Continue vitamin D  supplementation - Recheck vitamin D  levels at the  end of summer  Allergic Conjunctivitis She reports watery eyes, particularly in one eye, likely due to allergies. She has been using steroid drops and Pataday. Symptoms may be due to increased tear production and inflammation of the tear ducts. Recommended starting Flonase, explaining it should be angled towards the outside of the eye and will take at least five days to show improvement, with full effect in two weeks. - Start Flonase daily, angling the spray towards the outside of the eye - Monitor symptoms for improvement over two weeks    She was informed of the importance of frequent follow up visits to maximize her success with intensive lifestyle modifications for her multiple health conditions.    Jasmine Mesi, MD

## 2023-07-27 ENCOUNTER — Encounter (INDEPENDENT_AMBULATORY_CARE_PROVIDER_SITE_OTHER): Payer: Self-pay | Admitting: Family Medicine

## 2023-07-27 ENCOUNTER — Ambulatory Visit (INDEPENDENT_AMBULATORY_CARE_PROVIDER_SITE_OTHER): Admitting: Family Medicine

## 2023-07-27 VITALS — BP 114/82 | HR 78 | Temp 97.9°F | Ht 69.0 in | Wt 235.0 lb

## 2023-07-27 DIAGNOSIS — F32A Depression, unspecified: Secondary | ICD-10-CM

## 2023-07-27 DIAGNOSIS — Z7984 Long term (current) use of oral hypoglycemic drugs: Secondary | ICD-10-CM

## 2023-07-27 DIAGNOSIS — F3289 Other specified depressive episodes: Secondary | ICD-10-CM

## 2023-07-27 DIAGNOSIS — Z6834 Body mass index (BMI) 34.0-34.9, adult: Secondary | ICD-10-CM

## 2023-07-27 DIAGNOSIS — E119 Type 2 diabetes mellitus without complications: Secondary | ICD-10-CM

## 2023-07-27 DIAGNOSIS — R400 Somnolence: Secondary | ICD-10-CM | POA: Diagnosis not present

## 2023-07-27 DIAGNOSIS — E559 Vitamin D deficiency, unspecified: Secondary | ICD-10-CM

## 2023-07-27 DIAGNOSIS — G4733 Obstructive sleep apnea (adult) (pediatric): Secondary | ICD-10-CM | POA: Diagnosis not present

## 2023-07-27 DIAGNOSIS — I10 Essential (primary) hypertension: Secondary | ICD-10-CM | POA: Diagnosis not present

## 2023-07-27 DIAGNOSIS — R7303 Prediabetes: Secondary | ICD-10-CM

## 2023-07-27 DIAGNOSIS — E669 Obesity, unspecified: Secondary | ICD-10-CM

## 2023-07-27 MED ORDER — ESCITALOPRAM OXALATE 20 MG PO TABS
20.0000 mg | ORAL_TABLET | Freq: Every day | ORAL | 0 refills | Status: DC
Start: 2023-07-27 — End: 2023-08-30

## 2023-07-27 MED ORDER — AMLODIPINE BESYLATE 10 MG PO TABS: 10.0000 mg | ORAL_TABLET | Freq: Every day | ORAL | 0 refills | Status: DC

## 2023-07-27 MED ORDER — METFORMIN HCL ER 750 MG PO TB24: 750.0000 mg | ORAL_TABLET | Freq: Every day | ORAL | 0 refills | Status: DC

## 2023-07-27 MED ORDER — VITAMIN D (ERGOCALCIFEROL) 1.25 MG (50000 UNIT) PO CAPS: 50000.0000 [IU] | ORAL_CAPSULE | ORAL | 0 refills | Status: DC

## 2023-07-27 NOTE — Progress Notes (Signed)
 Office: [phone]  /  Fax: [phone]  WEIGHT SUMMARY AND BIOMETRICS  Anthropometric Measurements Height: 5' 9 (1.753 m) Weight: 235 lb (106.6 kg) BMI (Calculated): 34.69 Weight at Last Visit: 240 lb Weight Lost Since Last Visit: 5 lb Weight Gained Since Last Visit: 0 Starting Weight: 294 lb Total Weight Loss (lbs): 59 lb (26.8 kg) Peak Weight: 294 lb   Body Composition  Body Fat %: 44.8 % Fat Mass (lbs): 105.2 lbs Muscle Mass (lbs): 123.2 lbs Total Body Water (lbs): 87.6 lbs Visceral Fat Rating : 11   Other Clinical Data Fasting: Yes Labs: Yes Today's Visit #: 16 Starting Date: 06/24/17    Chief Complaint: OBESITY    History of Present Illness Jennifer Frazier is a 48 year old female who presents for a follow-up on her weight management plan.  She is adhering to a weight management plan that includes a journaling strategy with a daily goal of 1500 calories and at least 100 grams of protein, which she meets 90% of the time. She engages in walking and strengthening exercises for 30 minutes, five times a week. Over the past four weeks, she has lost five pounds.  She experiences sleepiness despite getting a good night's rest, which she attributes to going to bed late. She typically gets about five hours of sleep per night and takes a 30-minute nap during her lunch break. She has a history of sleep apnea diagnosed in 2020, with an AHI of 23, indicating moderate to severe sleep apnea. She has not used any appliances for this condition.  She is currently taking amlodipine , metformin , vitamin D , and Lexapro . She takes Lexapro  and amlodipine  in the morning. She reports no issues with these medications, including no gastrointestinal issues with metformin . No episodes of lightheadedness or dizziness. Her last labs in April showed a vitamin D  level of 35.      PHYSICAL EXAM:  Blood pressure 114/82, pulse 78, temperature 97.9 F (36.6 C), height 5' 9 (1.753 m), weight  235 lb (106.6 kg), SpO2 98%. Body mass index is 34.7 kg/m.  DIAGNOSTIC DATA REVIEWED:  BMET    Component Value Date/Time   NA 142 05/04/2023 1117   NA 140 11/04/2016 1500   K 4.0 05/04/2023 1117   K 3.8 11/04/2016 1500   CL 102 05/04/2023 1117   CO2 23 05/04/2023 1117   CO2 27 11/04/2016 1500   GLUCOSE 81 05/04/2023 1117   GLUCOSE 81 11/13/2019 1321   GLUCOSE 88 11/04/2016 1500   BUN 18 05/04/2023 1117   BUN 17.6 11/04/2016 1500   CREATININE 0.91 05/04/2023 1117   CREATININE 1.1 11/04/2016 1500   CALCIUM 10.1 05/04/2023 1117   CALCIUM 9.6 11/04/2016 1500   GFRNONAA 68 01/30/2020 1155   GFRNONAA >60 11/13/2019 1321   GFRAA 78 01/30/2020 1155   Lab Results  Component Value Date   HGBA1C 5.2 05/04/2023   HGBA1C 5.3 06/24/2017   Lab Results  Component Value Date   INSULIN  6.9 05/04/2023   INSULIN  6.2 06/24/2017   Lab Results  Component Value Date   TSH 1.65 09/04/2020   CBC    Component Value Date/Time   WBC 4.0 09/04/2020 0000   WBC 6.0 11/13/2019 1321   RBC 5.12 (A) 09/04/2020 0000   HGB 14.3 09/04/2020 0000   HGB 13.8 06/24/2017 1236   HGB 13.3 11/04/2016 1500   HCT 43 09/04/2020 0000   HCT 43.5 06/24/2017 1236   HCT 40.6 11/04/2016 1500   PLT 214 09/04/2020  0000   PLT 208 11/04/2016 1500   MCV 85.1 11/13/2019 1321   MCV 86 06/24/2017 1236   MCV 84.4 11/04/2016 1500   MCH 28.5 11/13/2019 1321   MCHC 33.5 11/13/2019 1321   RDW 13.0 11/13/2019 1321   RDW 14.8 06/24/2017 1236   RDW 13.9 11/04/2016 1500   Iron Studies No results found for: IRON, TIBC, FERRITIN, IRONPCTSAT Lipid Panel     Component Value Date/Time   CHOL 174 05/04/2023 1117   TRIG 49 05/04/2023 1117   HDL 92 05/04/2023 1117   LDLCALC 72 05/04/2023 1117   Hepatic Function Panel     Component Value Date/Time   PROT 7.6 05/04/2023 1117   PROT 7.5 11/04/2016 1500   ALBUMIN 4.5 05/04/2023 1117   ALBUMIN 3.8 11/04/2016 1500   AST 16 05/04/2023 1117   AST 20 11/04/2016  1500   ALT 11 05/04/2023 1117   ALT 15 11/04/2016 1500   ALKPHOS 104 05/04/2023 1117   ALKPHOS 87 11/04/2016 1500   BILITOT 0.3 05/04/2023 1117   BILITOT 0.31 11/04/2016 1500      Component Value Date/Time   TSH 1.65 09/04/2020 0000   TSH 1.600 04/18/2019 1228   Nutritional Lab Results  Component Value Date   VD25OH 35.6 05/04/2023   VD25OH 36.9 07/22/2022   VD25OH 49.8 09/05/2021     Assessment and Plan Assessment & Plan Obesity She is actively engaged in a structured diet and exercise plan, achieving a 5-pound weight loss over the last four weeks. Her regimen includes a 1500 calorie goal with a protein intake of 100 grams or more, met 90% of the time. She participates in walking and strengthening exercises for 30 minutes, five times a week. Emphasis is placed on meeting protein goals even if calorie intake is slightly higher on some days. Avoidance of high-calorie days is advised, as occasional maintenance days are acceptable but excessive calorie intake can hinder progress. - Continue current diet and exercise regimen - Encourage protein intake over strict calorie adherence - Avoid high-calorie days  Sleep Apnea She has moderate to severe sleep apnea with an AHI of 23. Weight loss is expected to help, but some patients may not see improvement despite weight loss. Untreated sleep apnea can lower metabolism and make weight loss more difficult. If symptoms persist, retesting may be considered. - Monitor weight loss impact on sleep apnea - Consider retesting for sleep apnea if symptoms persist  Daytime Somnolence She reports persistent daytime sleepiness despite adequate nocturnal sleep. Possible anemia or thyroid  issues are suspected, and CBC and thyroid  function tests are planned. Her sleep apnea may contribute to her symptoms, as untreated sleep apnea can impact metabolism and weight loss. - Order CBC and thyroid  function tests - Consider sleep apnea management if symptoms  persist  Hypertension Blood pressure is well-controlled at 114/82 mmHg on amlodipine , with no episodes of lightheadedness or dizziness. Monitoring for symptoms of hypotension is advised as weight loss continues, which may necessitate medication adjustments. - Continue amlodipine  - Monitor for symptoms of hypotension  Type 2 Diabetes Mellitus She is on metformin  and reports no gastrointestinal issues. The current medication regimen will be continued. - Continue metformin   Depression She is taking Lexapro  in the morning along with amlodipine . There are no reported issues with this regimen. - Continue Lexapro  in the morning  Vitamin D  Deficiency Her vitamin D  level was 35 ng/mL in April, below the target of 50 ng/mL. Improvement is anticipated during the summer months, and levels  will be rechecked at the end of summer or beginning of fall. - Continue current vitamin D  supplementation - Recheck vitamin D  levels at end of summer or beginning of fall  Follow-up She has a follow-up appointment scheduled for August 4th and is advised to schedule another appointment in September. - Attend follow-up appointment on August 4th - Schedule September follow-up appointment    She was informed of the importance of frequent follow up visits to maximize her success with intensive lifestyle modifications for her multiple health conditions.    Louann Penton, MD

## 2023-07-28 LAB — CBC WITH DIFFERENTIAL/PLATELET
Basophils Absolute: 0 10*3/uL (ref 0.0–0.2)
Basos: 0 %
EOS (ABSOLUTE): 0.1 10*3/uL (ref 0.0–0.4)
Eos: 3 %
Hematocrit: 44.3 % (ref 34.0–46.6)
Hemoglobin: 14 g/dL (ref 11.1–15.9)
Immature Grans (Abs): 0 10*3/uL (ref 0.0–0.1)
Immature Granulocytes: 0 %
Lymphocytes Absolute: 1.7 10*3/uL (ref 0.7–3.1)
Lymphs: 38 %
MCH: 27.4 pg (ref 26.6–33.0)
MCHC: 31.6 g/dL (ref 31.5–35.7)
MCV: 87 fL (ref 79–97)
Monocytes Absolute: 0.3 10*3/uL (ref 0.1–0.9)
Monocytes: 6 %
Neutrophils Absolute: 2.5 10*3/uL (ref 1.4–7.0)
Neutrophils: 53 %
Platelets: 222 10*3/uL (ref 150–450)
RBC: 5.11 x10E6/uL (ref 3.77–5.28)
RDW: 13.5 % (ref 11.7–15.4)
WBC: 4.6 10*3/uL (ref 3.4–10.8)

## 2023-07-28 LAB — TSH: TSH: 1.36 u[IU]/mL (ref 0.450–4.500)

## 2023-08-30 ENCOUNTER — Encounter (INDEPENDENT_AMBULATORY_CARE_PROVIDER_SITE_OTHER): Payer: Self-pay | Admitting: Family Medicine

## 2023-08-30 ENCOUNTER — Ambulatory Visit (INDEPENDENT_AMBULATORY_CARE_PROVIDER_SITE_OTHER): Admitting: Family Medicine

## 2023-08-30 VITALS — BP 117/80 | HR 77 | Temp 97.7°F | Ht 69.0 in | Wt 237.0 lb

## 2023-08-30 DIAGNOSIS — F3289 Other specified depressive episodes: Secondary | ICD-10-CM

## 2023-08-30 DIAGNOSIS — R7303 Prediabetes: Secondary | ICD-10-CM

## 2023-08-30 DIAGNOSIS — F439 Reaction to severe stress, unspecified: Secondary | ICD-10-CM

## 2023-08-30 DIAGNOSIS — F5089 Other specified eating disorder: Secondary | ICD-10-CM

## 2023-08-30 DIAGNOSIS — E559 Vitamin D deficiency, unspecified: Secondary | ICD-10-CM | POA: Diagnosis not present

## 2023-08-30 DIAGNOSIS — I10 Essential (primary) hypertension: Secondary | ICD-10-CM

## 2023-08-30 DIAGNOSIS — Z6835 Body mass index (BMI) 35.0-35.9, adult: Secondary | ICD-10-CM

## 2023-08-30 DIAGNOSIS — E669 Obesity, unspecified: Secondary | ICD-10-CM

## 2023-08-30 DIAGNOSIS — H04203 Unspecified epiphora, bilateral lacrimal glands: Secondary | ICD-10-CM

## 2023-08-30 MED ORDER — ESCITALOPRAM OXALATE 20 MG PO TABS: 20.0000 mg | ORAL_TABLET | Freq: Every day | ORAL | 0 refills | Status: DC

## 2023-08-30 MED ORDER — VITAMIN D (ERGOCALCIFEROL) 1.25 MG (50000 UNIT) PO CAPS: 50000.0000 [IU] | ORAL_CAPSULE | ORAL | 0 refills | Status: DC

## 2023-08-30 MED ORDER — AMLODIPINE BESYLATE 10 MG PO TABS: 10.0000 mg | ORAL_TABLET | Freq: Every day | ORAL | 0 refills | Status: DC

## 2023-08-30 MED ORDER — METFORMIN HCL ER 750 MG PO TB24: 750.0000 mg | ORAL_TABLET | Freq: Every day | ORAL | 0 refills | Status: DC

## 2023-08-30 NOTE — Progress Notes (Signed)
 Office: [phone]  /  Fax: [phone]  WEIGHT SUMMARY AND BIOMETRICS  Anthropometric Measurements Height: 5' 9 (1.753 m) Weight: 237 lb (107.5 kg) BMI (Calculated): 34.98 Weight at Last Visit: 235lb Weight Lost Since Last Visit: 0lb Weight Gained Since Last Visit: 2lb Starting Weight: 294lb Total Weight Loss (lbs): 57 lb (25.9 kg) Peak Weight: 294lb   Body Composition  Body Fat %: 45 % Fat Mass (lbs): 106.6 lbs Muscle Mass (lbs): 123.8 lbs Total Body Water (lbs): 88 lbs Visceral Fat Rating : 12   Other Clinical Data Fasting: Yes Labs: no Today's Visit #: 38 Starting Date: 06/24/17    Chief Complaint: OBESITY   Discussed the use of AI scribe software for clinical note transcription with the patient, who gave verbal consent to proceed.  History of Present Illness Jennifer Frazier is a 48 year old female with obesity and hypertension who presents for obesity treatment and progress assessment.  She is adhering to a structured weight management plan, consuming 1500 calories daily with over 100 grams of protein, and follows this regimen 95% of the time. Her diet includes more whole foods and increased water intake. She maintains a consistent exercise routine, engaging in 30-40 minutes of activity five days a week, walking 10,000 steps daily, and incorporating strength training. Despite these efforts, she has gained two pounds in the last month. She struggles with achieving 7-8 hours of sleep per night, often going to bed late and waking up on the sofa, and does not feel refreshed upon waking.  She is currently on amlodipine  10 mg daily for hypertension. Her blood pressure today was 117/80 mmHg. She is taking her medication as prescribed and is working on diet, exercise, and weight loss. She requests a refill of her amlodipine .  She has a diagnosis of vitamin D  deficiency and is being treated with prescription vitamin D , 50,000 IU weekly. Her last vitamin D  level was 35.6  ng/mL, which is below the target of 50 ng/mL. She requests a refill of her vitamin D  prescription.  She experiences emotional eating behavior and is being treated with Lexapro , on which she is stable. She requests a refill of her Lexapro .  She has prediabetes and is working on diet, exercise, and weight loss. She is also being treated with metformin  XR, 750 mg daily.  She reports persistent watery eyes, which might be due to allergies. No lightheadedness or dizziness is noted.      PHYSICAL EXAM:  Blood pressure 117/80, pulse 77, temperature 97.7 F (36.5 C), height 5' 9 (1.753 m), weight 237 lb (107.5 kg), SpO2 98%. Body mass index is 35 kg/m.  DIAGNOSTIC DATA REVIEWED:  BMET    Component Value Date/Time   NA 142 05/04/2023 1117   NA 140 11/04/2016 1500   K 4.0 05/04/2023 1117   K 3.8 11/04/2016 1500   CL 102 05/04/2023 1117   CO2 23 05/04/2023 1117   CO2 27 11/04/2016 1500   GLUCOSE 81 05/04/2023 1117   GLUCOSE 81 11/13/2019 1321   GLUCOSE 88 11/04/2016 1500   BUN 18 05/04/2023 1117   BUN 17.6 11/04/2016 1500   CREATININE 0.91 05/04/2023 1117   CREATININE 1.1 11/04/2016 1500   CALCIUM 10.1 05/04/2023 1117   CALCIUM 9.6 11/04/2016 1500   GFRNONAA 68 01/30/2020 1155   GFRNONAA >60 11/13/2019 1321   GFRAA 78 01/30/2020 1155   Lab Results  Component Value Date   HGBA1C 5.2 05/04/2023   HGBA1C 5.3 06/24/2017   Lab Results  Component Value Date   INSULIN  6.9 05/04/2023   INSULIN  6.2 06/24/2017   Lab Results  Component Value Date   TSH 1.360 07/27/2023   CBC    Component Value Date/Time   WBC 4.6 07/27/2023 0834   WBC 6.0 11/13/2019 1321   RBC 5.11 07/27/2023 0834   RBC 5.12 (A) 09/04/2020 0000   HGB 14.0 07/27/2023 0834   HGB 13.3 11/04/2016 1500   HCT 44.3 07/27/2023 0834   HCT 40.6 11/04/2016 1500   PLT 222 07/27/2023 0834   MCV 87 07/27/2023 0834   MCV 84.4 11/04/2016 1500   MCH 27.4 07/27/2023 0834   MCH 28.5 11/13/2019 1321   MCHC 31.6  07/27/2023 0834   MCHC 33.5 11/13/2019 1321   RDW 13.5 07/27/2023 0834   RDW 13.9 11/04/2016 1500   Iron Studies No results found for: IRON, TIBC, FERRITIN, IRONPCTSAT Lipid Panel     Component Value Date/Time   CHOL 174 05/04/2023 1117   TRIG 49 05/04/2023 1117   HDL 92 05/04/2023 1117   LDLCALC 72 05/04/2023 1117   Hepatic Function Panel     Component Value Date/Time   PROT 7.6 05/04/2023 1117   PROT 7.5 11/04/2016 1500   ALBUMIN 4.5 05/04/2023 1117   ALBUMIN 3.8 11/04/2016 1500   AST 16 05/04/2023 1117   AST 20 11/04/2016 1500   ALT 11 05/04/2023 1117   ALT 15 11/04/2016 1500   ALKPHOS 104 05/04/2023 1117   ALKPHOS 87 11/04/2016 1500   BILITOT 0.3 05/04/2023 1117   BILITOT 0.31 11/04/2016 1500      Component Value Date/Time   TSH 1.360 07/27/2023 0834   Nutritional Lab Results  Component Value Date   VD25OH 35.6 05/04/2023   VD25OH 36.9 07/22/2022   VD25OH 49.8 09/05/2021     Assessment and Plan Assessment & Plan Stress Stress is likely related to work and personal life, with no signs of a cortisol-producing tumor. Discussed cortisol's role in stress, its effects on fat storage and insulin  resistance, and stress management strategies. - Encourage stress management techniques, such as reducing exposure to controllable stressors and avoiding negative influences like office drama and excessive news consumption.  Obesity Obesity management includes a 1500-calorie diet with over 100 grams of protein daily. She is 95% compliant, incorporating whole foods and regular exercise. Despite a two-pound weight gain, increased muscle mass indicates positive changes. Exercise routine includes walking and strength training. - Continue current dietary and exercise regimen. - Monitor weight and muscle mass changes.  Essential hypertension Hypertension is well-controlled with amlodipine  10 mg daily. Current blood pressure is 117/80 mmHg with no symptoms of  lightheadedness or dizziness. - Refill amlodipine  10 mg.  Vitamin D  deficiency Vitamin D  deficiency is treated with 50,000 IU of vitamin D  weekly. Current level is 35.6 ng/mL, not yet at the goal of 50 ng/mL. - Continue vitamin D  50,000 IU weekly. - Refill vitamin D  prescription.  Emotional eating behavior Emotional eating behavior is managed with Lexapro , and she is stable on this medication. - Refill Lexapro .  Prediabetes Prediabetes is managed with diet, exercise, and metformin  XR 750 mg daily, focusing on weight loss and improving insulin  resistance. - Continue metformin  XR 750 mg daily.  Allergic rhinitis (suspected) with eye watering Suspected allergic rhinitis with symptoms of watery eyes. Discussed Flonase use for nasal and eye symptoms. No scleral injection or blepharitis noted - Use Flonase as directed for two weeks. - If symptoms persist, refer to an eye doctor for further evaluation.  She was informed of the importance of frequent follow up visits to maximize her success with intensive lifestyle modifications for her multiple health conditions.    Louann Penton, MD

## 2023-09-13 LAB — BASIC METABOLIC PANEL WITH GFR
BUN: 22 — AB (ref 4–21)
CO2: 24 — AB (ref 13–22)
Chloride: 102 (ref 99–108)
Creatinine: 1.1 (ref 0.5–1.1)
Glucose: 78
Potassium: 4.5 meq/L (ref 3.5–5.1)
Sodium: 141 (ref 137–147)

## 2023-09-13 LAB — CBC
EGFR: 63
RBC: 5.09 (ref 3.87–5.11)

## 2023-09-13 LAB — HEPATIC FUNCTION PANEL
ALT: 12 U/L (ref 7–35)
AST: 20 (ref 13–35)
Alkaline Phosphatase: 115 (ref 25–125)
Bilirubin, Total: 0.5

## 2023-09-13 LAB — CBC AND DIFFERENTIAL
HCT: 44 (ref 36–46)
Hemoglobin: 14.3 (ref 12.0–16.0)
Platelets: 238 K/uL (ref 150–400)
WBC: 5.1

## 2023-09-13 LAB — COMPREHENSIVE METABOLIC PANEL WITH GFR
Albumin: 4.7 (ref 3.5–5.0)
Calcium: 10.1 (ref 8.7–10.7)
Globulin: 3.2
eGFR: 63

## 2023-09-13 LAB — VITAMIN D 25 HYDROXY (VIT D DEFICIENCY, FRACTURES): Vit D, 25-Hydroxy: 66.3

## 2023-09-13 LAB — VITAMIN B12: Vitamin B-12: 1851

## 2023-09-28 ENCOUNTER — Other Ambulatory Visit: Payer: Self-pay | Admitting: Family Medicine

## 2023-09-28 DIAGNOSIS — Z1231 Encounter for screening mammogram for malignant neoplasm of breast: Secondary | ICD-10-CM

## 2023-10-04 ENCOUNTER — Ambulatory Visit (INDEPENDENT_AMBULATORY_CARE_PROVIDER_SITE_OTHER): Admitting: Family Medicine

## 2023-10-04 ENCOUNTER — Encounter (INDEPENDENT_AMBULATORY_CARE_PROVIDER_SITE_OTHER): Payer: Self-pay | Admitting: Family Medicine

## 2023-10-04 VITALS — BP 122/80 | HR 72 | Temp 98.0°F | Ht 69.0 in | Wt 235.0 lb

## 2023-10-04 DIAGNOSIS — F5089 Other specified eating disorder: Secondary | ICD-10-CM | POA: Diagnosis not present

## 2023-10-04 DIAGNOSIS — Z6834 Body mass index (BMI) 34.0-34.9, adult: Secondary | ICD-10-CM

## 2023-10-04 DIAGNOSIS — R7303 Prediabetes: Secondary | ICD-10-CM

## 2023-10-04 DIAGNOSIS — I10 Essential (primary) hypertension: Secondary | ICD-10-CM

## 2023-10-04 DIAGNOSIS — F3289 Other specified depressive episodes: Secondary | ICD-10-CM

## 2023-10-04 DIAGNOSIS — Z6841 Body Mass Index (BMI) 40.0 and over, adult: Secondary | ICD-10-CM

## 2023-10-04 DIAGNOSIS — Z6835 Body mass index (BMI) 35.0-35.9, adult: Secondary | ICD-10-CM

## 2023-10-04 DIAGNOSIS — E669 Obesity, unspecified: Secondary | ICD-10-CM

## 2023-10-04 DIAGNOSIS — E559 Vitamin D deficiency, unspecified: Secondary | ICD-10-CM | POA: Diagnosis not present

## 2023-10-04 MED ORDER — ESCITALOPRAM OXALATE 20 MG PO TABS: 20.0000 mg | ORAL_TABLET | Freq: Every day | ORAL | 0 refills | Status: DC

## 2023-10-04 MED ORDER — METFORMIN HCL ER 750 MG PO TB24: 750.0000 mg | ORAL_TABLET | Freq: Every day | ORAL | 0 refills | Status: DC

## 2023-10-04 MED ORDER — VITAMIN D (ERGOCALCIFEROL) 1.25 MG (50000 UNIT) PO CAPS: 50000.0000 [IU] | ORAL_CAPSULE | ORAL | 0 refills | Status: DC

## 2023-10-04 MED ORDER — AMLODIPINE BESYLATE 10 MG PO TABS: 10.0000 mg | ORAL_TABLET | Freq: Every day | ORAL | 0 refills | Status: DC

## 2023-10-04 NOTE — Progress Notes (Signed)
 Office: [phone]  /  Fax: [phone]  WEIGHT SUMMARY AND BIOMETRICS  Anthropometric Measurements Height: 5' 9 (1.753 m) Weight: 235 lb (106.6 kg) BMI (Calculated): 34.69 Weight at Last Visit: 237 lb Weight Lost Since Last Visit: 2 lb Weight Gained Since Last Visit: 0 Starting Weight: 294 lb Total Weight Loss (lbs): 59 lb (26.8 kg) Peak Weight: 294 lb   Body Composition  Body Fat %: 44.9 % Fat Mass (lbs): 105.8 lbs Muscle Mass (lbs): 123.2 lbs Total Body Water (lbs): 86.4 lbs Visceral Fat Rating : 12   Other Clinical Data Fasting: yes Labs: no Today's Visit #: 95 Starting Date: 06/24/17    Chief Complaint: OBESITY   History of Present Illness Jennifer Frazier is a 48 year old female with obesity, hypertension, and prediabetes who presents for obesity treatment and progress assessment.  She is adhering to a dietary plan with a caloric intake of 1500 calories and over 100 grams of protein daily, achieving this about 70% of the time. She engages in walking for exercise, 30 minutes, five days a week, and has lost two pounds in the last month. She is working on increasing her intake of fruits and vegetables, ensuring adequate protein intake, and avoiding skipping meals. However, she reports inadequate hydration and insufficient sleep, not reaching the recommended seven to nine hours per night.  She is taking amlodipine  10 mg daily and requests a refill.  She is managing prediabetes with metformin  XR 750 mg daily, alongside efforts in diet, exercise, and weight loss. She is working on these lifestyle changes to address both her prediabetes and hypertension.  She has a vitamin D  deficiency, treated with ergocalciferol  50,000 IU weekly, and requests a refill.  She has a history of emotional eating behaviors and is taking Lexapro  20 mg, which she finds helpful, and requests a refill.  She reports challenges in maintaining her dietary plan, particularly in the mornings,  finding it difficult to consume low-fat, high-protein foods. She often feels hungry in the morning but not consistently. She has been using alternatives like bagels with cottage cheese and fruit, but notes these do not provide sufficient protein. Most of her protein intake occurs at dinner.  She mentions a recent sensation in her hand, described as a 'buzzy, tingly' feeling, similar to a topical numbness, which she associates with nerve issues. This sensation is not painful but feels unusual when touched.  She reports an issue with her eyes that seemed to correlate with temperature changes, not improving with Flonase use. The symptoms resolved with cooler weather but returned with warmer temperatures.      PHYSICAL EXAM:  Blood pressure 122/80, pulse 72, temperature 98 F (36.7 C), height 5' 9 (1.753 m), weight 235 lb (106.6 kg), SpO2 99%. Body mass index is 34.7 kg/m.  DIAGNOSTIC DATA REVIEWED:  BMET    Component Value Date/Time   NA 142 05/04/2023 1117   NA 140 11/04/2016 1500   K 4.0 05/04/2023 1117   K 3.8 11/04/2016 1500   CL 102 05/04/2023 1117   CO2 23 05/04/2023 1117   CO2 27 11/04/2016 1500   GLUCOSE 81 05/04/2023 1117   GLUCOSE 81 11/13/2019 1321   GLUCOSE 88 11/04/2016 1500   BUN 18 05/04/2023 1117   BUN 17.6 11/04/2016 1500   CREATININE 0.91 05/04/2023 1117   CREATININE 1.1 11/04/2016 1500   CALCIUM 10.1 05/04/2023 1117   CALCIUM 9.6 11/04/2016 1500   GFRNONAA 68 01/30/2020 1155   GFRNONAA >60 11/13/2019 1321  GFRAA 78 01/30/2020 1155   Lab Results  Component Value Date   HGBA1C 5.2 05/04/2023   HGBA1C 5.3 06/24/2017   Lab Results  Component Value Date   INSULIN  6.9 05/04/2023   INSULIN  6.2 06/24/2017   Lab Results  Component Value Date   TSH 1.360 07/27/2023   CBC    Component Value Date/Time   WBC 4.6 07/27/2023 0834   WBC 6.0 11/13/2019 1321   RBC 5.11 07/27/2023 0834   RBC 5.12 (A) 09/04/2020 0000   HGB 14.0 07/27/2023 0834   HGB 13.3  11/04/2016 1500   HCT 44.3 07/27/2023 0834   HCT 40.6 11/04/2016 1500   PLT 222 07/27/2023 0834   MCV 87 07/27/2023 0834   MCV 84.4 11/04/2016 1500   MCH 27.4 07/27/2023 0834   MCH 28.5 11/13/2019 1321   MCHC 31.6 07/27/2023 0834   MCHC 33.5 11/13/2019 1321   RDW 13.5 07/27/2023 0834   RDW 13.9 11/04/2016 1500   Iron Studies No results found for: IRON, TIBC, FERRITIN, IRONPCTSAT Lipid Panel     Component Value Date/Time   CHOL 174 05/04/2023 1117   TRIG 49 05/04/2023 1117   HDL 92 05/04/2023 1117   LDLCALC 72 05/04/2023 1117   Hepatic Function Panel     Component Value Date/Time   PROT 7.6 05/04/2023 1117   PROT 7.5 11/04/2016 1500   ALBUMIN 4.5 05/04/2023 1117   ALBUMIN 3.8 11/04/2016 1500   AST 16 05/04/2023 1117   AST 20 11/04/2016 1500   ALT 11 05/04/2023 1117   ALT 15 11/04/2016 1500   ALKPHOS 104 05/04/2023 1117   ALKPHOS 87 11/04/2016 1500   BILITOT 0.3 05/04/2023 1117   BILITOT 0.31 11/04/2016 1500      Component Value Date/Time   TSH 1.360 07/27/2023 0834   Nutritional Lab Results  Component Value Date   VD25OH 35.6 05/04/2023   VD25OH 36.9 07/22/2022   VD25OH 49.8 09/05/2021     Assessment and Plan Assessment & Plan Obesity Obesity management with a focus on dietary changes and exercise. She is following a 1500 calorie diet with over 100 grams of protein, achieving this 70% of the time. She has lost 2 pounds in the last month. She is walking 30 minutes five days a week and working on increasing fruit and vegetable intake. Challenges include inadequate hydration and sleep, and difficulty maintaining protein intake in the morning. Discussed the importance of consuming 30 or more grams of protein in the morning to better manage hunger and meet goals. - Continue 1500 calorie diet with over 100 grams of protein - Encourage hydration and adequate sleep - Provide breakfast smoothie and protein pudding recipes to increase morning protein intake -  Encourage incorporation of strength training into exercise routine  Essential hypertension Hypertension is well-controlled with a blood pressure reading of 122/80 mmHg. She is on amlodipine  10 mg daily. - Refill amlodipine  10 mg daily - Continue lifestyle modifications including diet, exercise, and weight loss  Prediabetes Prediabetes is being managed with metformin  XR 750 mg daily, along with diet, exercise, and weight loss efforts. She is actively working on these lifestyle changes. - Continue metformin  XR 750 mg daily - Continue lifestyle modifications including diet, exercise, and weight loss  Vitamin D  deficiency Vitamin D  deficiency is being treated with ergocalciferol  50,000 IU weekly. - Refill ergocalciferol  50,000 IU weekly  emotional eating behaviors Emotional eating behaviors are being managed with Lexapro  20 mg, which she finds helpful. - Refill Lexapro  20 mg  Follow-Up Follow-up appointments are necessary to monitor progress and adjust treatment plans as needed. - Schedule 4 week follow-up appointments for October and November   Nivia was informed of the importance of frequent follow up visits to maximize her success with intensive lifestyle modifications for her obesity and obesity related health conditions as recommended by USPSTF and CMS guidelines   Louann Penton, MD

## 2023-10-05 ENCOUNTER — Other Ambulatory Visit (INDEPENDENT_AMBULATORY_CARE_PROVIDER_SITE_OTHER): Payer: Self-pay

## 2023-10-05 ENCOUNTER — Telehealth (INDEPENDENT_AMBULATORY_CARE_PROVIDER_SITE_OTHER): Payer: Self-pay

## 2023-10-05 NOTE — Telephone Encounter (Signed)
 Spoke with medical records to get copy of most recent labs done.  They will fax them.

## 2023-10-05 NOTE — Telephone Encounter (Signed)
Labs received and abstracted.

## 2023-10-22 ENCOUNTER — Ambulatory Visit
Admission: RE | Admit: 2023-10-22 | Discharge: 2023-10-22 | Disposition: A | Discharge status: Home / Self Care | Source: Ambulatory Visit | Attending: Family Medicine | Admitting: Family Medicine

## 2023-10-22 DIAGNOSIS — Z1231 Encounter for screening mammogram for malignant neoplasm of breast: Secondary | ICD-10-CM

## 2023-10-28 ENCOUNTER — Ambulatory Visit (INDEPENDENT_AMBULATORY_CARE_PROVIDER_SITE_OTHER): Admitting: Family Medicine

## 2023-10-28 ENCOUNTER — Encounter (INDEPENDENT_AMBULATORY_CARE_PROVIDER_SITE_OTHER): Payer: Self-pay | Admitting: Family Medicine

## 2023-10-28 VITALS — BP 124/83 | HR 82 | Temp 98.4°F | Ht 69.0 in | Wt 234.0 lb

## 2023-10-28 DIAGNOSIS — Z6841 Body Mass Index (BMI) 40.0 and over, adult: Secondary | ICD-10-CM

## 2023-10-28 DIAGNOSIS — F32A Depression, unspecified: Secondary | ICD-10-CM

## 2023-10-28 DIAGNOSIS — E559 Vitamin D deficiency, unspecified: Secondary | ICD-10-CM | POA: Diagnosis not present

## 2023-10-28 DIAGNOSIS — Z6834 Body mass index (BMI) 34.0-34.9, adult: Secondary | ICD-10-CM

## 2023-10-28 DIAGNOSIS — F3289 Other specified depressive episodes: Secondary | ICD-10-CM

## 2023-10-28 DIAGNOSIS — I10 Essential (primary) hypertension: Secondary | ICD-10-CM | POA: Diagnosis not present

## 2023-10-28 DIAGNOSIS — R7303 Prediabetes: Secondary | ICD-10-CM

## 2023-10-28 DIAGNOSIS — E669 Obesity, unspecified: Secondary | ICD-10-CM

## 2023-10-28 MED ORDER — AMLODIPINE BESYLATE 10 MG PO TABS
10.0000 mg | ORAL_TABLET | Freq: Every day | ORAL | 0 refills | Status: DC
Start: 2023-10-28 — End: 2023-12-02

## 2023-10-28 MED ORDER — ESCITALOPRAM OXALATE 20 MG PO TABS: 20.0000 mg | ORAL_TABLET | Freq: Every day | ORAL | 0 refills | Status: DC

## 2023-10-28 MED ORDER — VITAMIN D (ERGOCALCIFEROL) 1.25 MG (50000 UNIT) PO CAPS
50000.0000 [IU] | ORAL_CAPSULE | ORAL | 0 refills | Status: DC
Start: 2023-10-28 — End: 2023-12-02

## 2023-10-28 MED ORDER — METFORMIN HCL ER 750 MG PO TB24: 750.0000 mg | ORAL_TABLET | Freq: Every day | ORAL | 0 refills | Status: DC

## 2023-10-28 NOTE — Progress Notes (Signed)
 Office: [phone]  /  Fax: [phone]  WEIGHT SUMMARY AND BIOMETRICS  Anthropometric Measurements Height: 5' 9 (1.753 m) Weight: 234 lb (106.1 kg) BMI (Calculated): 34.54 Weight at Last Visit: 235 lb Weight Lost Since Last Visit: 1 lb Weight Gained Since Last Visit: 0 Starting Weight: 294 lb Total Weight Loss (lbs): 60 lb (27.2 kg) Peak Weight: 294 lb   Body Composition  Body Fat %: 45.1 % Fat Mass (lbs): 105.6 lbs Muscle Mass (lbs): 122 lbs Total Body Water (lbs): 86.4 lbs Visceral Fat Rating : 11   Other Clinical Data Fasting: yes Labs: no Today's Visit #: 96 Starting Date: 06/24/17    Chief Complaint: OBESITY   History of Present Illness Jennifer Frazier is a 48 year old female with obesity who presents for a follow-up on her obesity treatment and progress.  She struggles to adhere to a caloric intake of 1200 to 1500 calories per day, meeting her requirements about 15% of the time over the last month. She engages in physical activity by walking for 30 minutes, four days a week, and has lost one pound in the last month. Emotional eating behaviors are present, and she is working on identifying and improving these behaviors.  She is being treated for hypertension with amlodipine  10 mg daily. Her most recent blood pressure was 124/83 mmHg.  She is being treated for vitamin D  deficiency with prescription ergocalciferol  50,000 IU weekly and requests a refill. She is also on metformin  XR 750 mg daily for prediabetes and requests a refill. Lifestyle modifications are being targeted to improve her prediabetes and prevent progression to type 2 diabetes.  She is on Lexapro  20 mg daily for emotional support and has been stable on this medication, requesting a refill. She describes a hectic month due to work-related stress and home renovations, impacting her ability to relax during her vacation. She reports eating two meals a day, typically having Special K cereal with  protein granola and [employer] skim milk for breakfast, and a tortellini dish or protein shakes for dinner. She occasionally eats out, such as having chicken tenders from [employer], and is mindful of her protein intake.      PHYSICAL EXAM:  Blood pressure 124/83, pulse 82, temperature 98.4 F (36.9 C), height 5' 9 (1.753 m), weight 234 lb (106.1 kg), SpO2 98%. Body mass index is 34.56 kg/m.  DIAGNOSTIC DATA REVIEWED:  BMET    Component Value Date/Time   NA 141 09/13/2023 0000   NA 140 11/04/2016 1500   K 4.5 09/13/2023 0000   K 3.8 11/04/2016 1500   CL 102 09/13/2023 0000   CO2 24 (A) 09/13/2023 0000   CO2 27 11/04/2016 1500   GLUCOSE 81 05/04/2023 1117   GLUCOSE 81 11/13/2019 1321   GLUCOSE 88 11/04/2016 1500   BUN 22 (A) 09/13/2023 0000   BUN 17.6 11/04/2016 1500   CREATININE 1.1 09/13/2023 0000   CREATININE 0.91 05/04/2023 1117   CREATININE 1.1 11/04/2016 1500   CALCIUM 10.1 09/13/2023 0000   CALCIUM 9.6 11/04/2016 1500   GFRNONAA 68 01/30/2020 1155   GFRNONAA >60 11/13/2019 1321   GFRAA 78 01/30/2020 1155   Lab Results  Component Value Date   HGBA1C 5.2 05/04/2023   HGBA1C 5.3 06/24/2017   Lab Results  Component Value Date   INSULIN  6.9 05/04/2023   INSULIN  6.2 06/24/2017   Lab Results  Component Value Date   TSH 1.360 07/27/2023   CBC    Component Value Date/Time  WBC 5.1 09/13/2023 0000   WBC 6.0 11/13/2019 1321   RBC 5.09 09/13/2023 0000   HGB 14.3 09/13/2023 0000   HGB 14.0 07/27/2023 0834   HGB 13.3 11/04/2016 1500   HCT 44 09/13/2023 0000   HCT 44.3 07/27/2023 0834   HCT 40.6 11/04/2016 1500   PLT 238 09/13/2023 0000   PLT 222 07/27/2023 0834   MCV 87 07/27/2023 0834   MCV 84.4 11/04/2016 1500   MCH 27.4 07/27/2023 0834   MCH 28.5 11/13/2019 1321   MCHC 31.6 07/27/2023 0834   MCHC 33.5 11/13/2019 1321   RDW 13.5 07/27/2023 0834   RDW 13.9 11/04/2016 1500   Iron Studies No results found for: IRON, TIBC, FERRITIN,  IRONPCTSAT Lipid Panel     Component Value Date/Time   CHOL 174 05/04/2023 1117   TRIG 49 05/04/2023 1117   HDL 92 05/04/2023 1117   LDLCALC 72 05/04/2023 1117   Hepatic Function Panel     Component Value Date/Time   PROT 7.6 05/04/2023 1117   PROT 7.5 11/04/2016 1500   ALBUMIN 4.7 09/13/2023 0000   ALBUMIN 4.5 05/04/2023 1117   ALBUMIN 3.8 11/04/2016 1500   AST 20 09/13/2023 0000   AST 20 11/04/2016 1500   ALT 12 09/13/2023 0000   ALT 15 11/04/2016 1500   ALKPHOS 115 09/13/2023 0000   ALKPHOS 87 11/04/2016 1500   BILITOT 0.3 05/04/2023 1117   BILITOT 0.31 11/04/2016 1500      Component Value Date/Time   TSH 1.360 07/27/2023 0834   Nutritional Lab Results  Component Value Date   VD25OH 66.3 09/13/2023   VD25OH 35.6 05/04/2023   VD25OH 36.9 07/22/2022     Assessment and Plan Assessment & Plan Obesity Obesity management is ongoing with a focus on caloric intake and exercise. She has been prescribed a caloric intake of 1200-1500 calories per day but has struggled to adhere to this plan, meeting the target only 15% of the time over the last month. She has been engaging in physical activity by walking for 30 minutes, four days a week, resulting in a weight loss of one pound in the last month. Emotional eating behaviors are present, and she is working on identifying and improving these behaviors. Despite the challenges, she is making better food choices under stressful circumstances. - Continue current caloric intake plan of 1200-1500 calories per day with 85 or more grams of protein - Encourage adherence to journaling dietary intake - Continue walking 30 minutes four days a week - Provide updated eating out handout for better meal choices  Prediabetes Prediabetes is being managed with metformin  XR 750 mg daily and lifestyle modifications. She reports no gastrointestinal side effects from metformin . Efforts are focused on preventing progression to type 2 diabetes through  medication and lifestyle changes. - Continue metformin  XR 750 mg daily - Refill metformin  prescription  Essential hypertension Hypertension is well-controlled with amlodipine  10 mg daily. Current blood pressure is 124/83 mmHg. Weight loss efforts are contributing positively to blood pressure management. - Continue amlodipine  10 mg daily  Depression Depression is well-managed on Lexapro  20 mg daily. - Continue Lexapro  20 mg daily - Refill Lexapro  prescription  Vitamin D  deficiency Vitamin D  deficiency is being treated with ergocalciferol  50,000 IU weekly. She requests a refill of this medication. - Continue ergocalciferol  50,000 IU weekly - Refill ergocalciferol  prescription     Latunya was informed of the importance of frequent follow up visits to maximize her success with intensive lifestyle modifications for her  obesity and obesity related health conditions as recommended by USPSTF and CMS guidelines   Louann Penton, MD

## 2023-12-02 ENCOUNTER — Ambulatory Visit (INDEPENDENT_AMBULATORY_CARE_PROVIDER_SITE_OTHER): Payer: Self-pay | Admitting: Family Medicine

## 2023-12-02 ENCOUNTER — Encounter (INDEPENDENT_AMBULATORY_CARE_PROVIDER_SITE_OTHER): Payer: Self-pay | Admitting: Family Medicine

## 2023-12-02 VITALS — BP 120/88 | HR 78 | Temp 98.2°F | Ht 69.0 in | Wt 237.0 lb

## 2023-12-02 DIAGNOSIS — F3289 Other specified depressive episodes: Secondary | ICD-10-CM

## 2023-12-02 DIAGNOSIS — Z6835 Body mass index (BMI) 35.0-35.9, adult: Secondary | ICD-10-CM

## 2023-12-02 DIAGNOSIS — I10 Essential (primary) hypertension: Secondary | ICD-10-CM

## 2023-12-02 DIAGNOSIS — E559 Vitamin D deficiency, unspecified: Secondary | ICD-10-CM | POA: Diagnosis not present

## 2023-12-02 DIAGNOSIS — F32A Depression, unspecified: Secondary | ICD-10-CM

## 2023-12-02 DIAGNOSIS — F5089 Other specified eating disorder: Secondary | ICD-10-CM | POA: Diagnosis not present

## 2023-12-02 DIAGNOSIS — R7303 Prediabetes: Secondary | ICD-10-CM

## 2023-12-02 DIAGNOSIS — E669 Obesity, unspecified: Secondary | ICD-10-CM

## 2023-12-02 MED ORDER — AMLODIPINE BESYLATE 10 MG PO TABS: 10.0000 mg | ORAL_TABLET | Freq: Every day | ORAL | 0 refills | Status: DC

## 2023-12-02 MED ORDER — METFORMIN HCL ER 750 MG PO TB24: 750.0000 mg | ORAL_TABLET | Freq: Every day | ORAL | 0 refills | Status: DC

## 2023-12-02 MED ORDER — VITAMIN D (ERGOCALCIFEROL) 1.25 MG (50000 UNIT) PO CAPS: 50000.0000 [IU] | ORAL_CAPSULE | ORAL | 0 refills | Status: DC

## 2023-12-02 MED ORDER — ESCITALOPRAM OXALATE 20 MG PO TABS: 20.0000 mg | ORAL_TABLET | Freq: Every day | ORAL | 0 refills | Status: DC

## 2023-12-02 NOTE — Progress Notes (Signed)
 Office: [phone]  /  Fax: [phone]  WEIGHT SUMMARY AND BIOMETRICS  Anthropometric Measurements Height: 5' 9 (1.753 m) Weight: 237 lb (107.5 kg) BMI (Calculated): 34.98 Weight at Last Visit: 234 lb Weight Lost Since Last Visit: 0 Weight Gained Since Last Visit: 3 lb Starting Weight: 294 lb Total Weight Loss (lbs): 57 lb (25.9 kg) Peak Weight: 294 lb   Body Composition  Body Fat %: 45.3 % Fat Mass (lbs): 107.6 lbs Muscle Mass (lbs): 123.4 lbs Total Body Water (lbs): 89.4 lbs Visceral Fat Rating : 12   Other Clinical Data Fasting: yes Labs: no Today's Visit #: 30 Starting Date: 06/24/17    Chief Complaint: OBESITY   Discussed the use of AI scribe software for clinical note transcription with the patient, who gave verbal consent to proceed.  History of Present Illness Jennifer Frazier is a 48 year old female with obesity and hypertension who presents for obesity treatment and progress assessment.  She is following a dietary plan targeting 1500 calories and 120 grams of protein daily but is not actively tracking her intake. She is attempting to increase fruit and vegetable consumption but is not meeting the recommended protein intake. She reports inadequate hydration and skipping meals. Over the past month, she has gained three pounds, which she attributes to fluid retention, particularly noticeable in her feet and ankles, more so on the right side. Despite this, she has lost 57 pounds overall since starting her weight loss journey.  She is being treated for hypertension with amlodipine  10 mg daily, and her blood pressure is controlled at 120/88 mmHg. She requests a refill of her medication.  She is on prescription ergocalciferol  50,000 IU weekly for vitamin D  deficiency, with her most recent vitamin D  level at 66.3 ng/mL. She plans to continue this treatment.  She is taking Lexapro  20 mg daily for emotional eating behaviors and metformin  XR 750 mg daily for  prediabetes. She is making efforts to manage her prediabetes through diet and exercise.  She reports that she has been going to bed earlier and feels her sleep quality has improved, though she sometimes feels tired. She has been walking for exercise for 30 minutes, five times a week.  She experiences stomach upset after consuming large amounts of cruciferous vegetables, such as broccoli, which she attributes to volume intolerance.      PHYSICAL EXAM:  Blood pressure 120/88, pulse 78, temperature 98.2 F (36.8 C), height 5' 9 (1.753 m), weight 237 lb (107.5 kg), SpO2 98%. Body mass index is 35 kg/m.  DIAGNOSTIC DATA REVIEWED:  BMET    Component Value Date/Time   NA 141 09/13/2023 0000   NA 140 11/04/2016 1500   K 4.5 09/13/2023 0000   K 3.8 11/04/2016 1500   CL 102 09/13/2023 0000   CO2 24 (A) 09/13/2023 0000   CO2 27 11/04/2016 1500   GLUCOSE 81 05/04/2023 1117   GLUCOSE 81 11/13/2019 1321   GLUCOSE 88 11/04/2016 1500   BUN 22 (A) 09/13/2023 0000   BUN 17.6 11/04/2016 1500   CREATININE 1.1 09/13/2023 0000   CREATININE 0.91 05/04/2023 1117   CREATININE 1.1 11/04/2016 1500   CALCIUM 10.1 09/13/2023 0000   CALCIUM 9.6 11/04/2016 1500   GFRNONAA 68 01/30/2020 1155   GFRNONAA >60 11/13/2019 1321   GFRAA 78 01/30/2020 1155   Lab Results  Component Value Date   HGBA1C 5.2 05/04/2023   HGBA1C 5.3 06/24/2017   Lab Results  Component Value Date   INSULIN   6.9 05/04/2023   INSULIN  6.2 06/24/2017   Lab Results  Component Value Date   TSH 1.360 07/27/2023   CBC    Component Value Date/Time   WBC 5.1 09/13/2023 0000   WBC 6.0 11/13/2019 1321   RBC 5.09 09/13/2023 0000   HGB 14.3 09/13/2023 0000   HGB 14.0 07/27/2023 0834   HGB 13.3 11/04/2016 1500   HCT 44 09/13/2023 0000   HCT 44.3 07/27/2023 0834   HCT 40.6 11/04/2016 1500   PLT 238 09/13/2023 0000   PLT 222 07/27/2023 0834   MCV 87 07/27/2023 0834   MCV 84.4 11/04/2016 1500   MCH 27.4 07/27/2023 0834   MCH  28.5 11/13/2019 1321   MCHC 31.6 07/27/2023 0834   MCHC 33.5 11/13/2019 1321   RDW 13.5 07/27/2023 0834   RDW 13.9 11/04/2016 1500   Iron Studies No results found for: IRON, TIBC, FERRITIN, IRONPCTSAT Lipid Panel     Component Value Date/Time   CHOL 174 05/04/2023 1117   TRIG 49 05/04/2023 1117   HDL 92 05/04/2023 1117   LDLCALC 72 05/04/2023 1117   Hepatic Function Panel     Component Value Date/Time   PROT 7.6 05/04/2023 1117   PROT 7.5 11/04/2016 1500   ALBUMIN 4.7 09/13/2023 0000   ALBUMIN 4.5 05/04/2023 1117   ALBUMIN 3.8 11/04/2016 1500   AST 20 09/13/2023 0000   AST 20 11/04/2016 1500   ALT 12 09/13/2023 0000   ALT 15 11/04/2016 1500   ALKPHOS 115 09/13/2023 0000   ALKPHOS 87 11/04/2016 1500   BILITOT 0.3 05/04/2023 1117   BILITOT 0.31 11/04/2016 1500      Component Value Date/Time   TSH 1.360 07/27/2023 0834   Nutritional Lab Results  Component Value Date   VD25OH 66.3 09/13/2023   VD25OH 35.6 05/04/2023   VD25OH 36.9 07/22/2022     Assessment and Plan Assessment & Plan Obesity Recent weight gain of 3 pounds, likely due to fluid retention. Overall weight loss of 57 pounds since starting treatment. Current dietary plan includes 1500 calories and 120 mg of protein, but she is not tracking calories or macros. She is trying to increase fruit and vegetable intake but is not meeting protein goals. Hydration is inadequate, and she is skipping meals. Exercise includes walking 30 minutes five times a week. Fluid retention noted, particularly in the right foot and ankle, likely due to increased sodium and carbohydrate intake. - Continue current dietary plan with focus on increasing protein intake and adequate hydration. - Encouraged tracking of calories and macros.(1500 kcal and 120+ gm protein) - Advised on reducing sodium and carbohydrate intake to manage fluid retention. - Continue walking 30 minutes five times a week.  Essential  hypertension Hypertension is well-controlled with a blood pressure of 120/88 mmHg. She is on amlodipine  10 mg daily. - Refilled amlodipine  10 mg daily. - Continue monitoring blood pressure. - Continue diet, exercise and weight loss as discussed today as an important part of the treatment plan   Vitamin D  deficiency Vitamin D  levels are at goal with a recent level of 66.3 ng/mL. She is on ergocalciferol  50,000 IU weekly. - Refilled ergocalciferol  50,000 IU weekly. - Continue monitoring vitamin D  levels.  Prediabetes Managed with metformin  XR 750 mg daily. She is making efforts with diet and exercise to manage prediabetes. - Continue metformin  XR 750 mg daily. - Encouraged continued efforts with diet and exercise.  Emotional eating behaviors associated with depression Emotional eating behaviors are managed with Lexapro   20 mg daily. - Continue Lexapro  20 mg daily.  Peripheral edema Noted, particularly in the right foot and ankle, likely due to fluid retention from dietary factors. - Advised on reducing sodium and carbohydrate intake to manage fluid retention.    Jennifer Frazier was counseled on the importance of maintaining healthy lifestyle habits, including balanced nutrition, regular physical activity, and behavioral modifications, while taking antiobesity medication.  Patient verbalized understanding that medication is an adjunct to, not a replacement for, lifestyle changes and that the long-term success and weight maintenance depend on continued adherence to these strategies.   Jennifer Frazier was informed of the importance of frequent follow up visits to maximize her success with intensive lifestyle modifications for her obesity and obesity related health conditions as recommended by USPSTF and CMS guidelines   Louann Penton, MD

## 2024-01-04 ENCOUNTER — Ambulatory Visit (INDEPENDENT_AMBULATORY_CARE_PROVIDER_SITE_OTHER): Payer: Self-pay | Admitting: Family Medicine

## 2024-01-04 VITALS — BP 125/84 | HR 71 | Temp 97.8°F | Ht 69.0 in | Wt 241.0 lb

## 2024-01-04 DIAGNOSIS — Z6835 Body mass index (BMI) 35.0-35.9, adult: Secondary | ICD-10-CM | POA: Diagnosis not present

## 2024-01-04 DIAGNOSIS — E88819 Insulin resistance, unspecified: Secondary | ICD-10-CM

## 2024-01-04 DIAGNOSIS — I10 Essential (primary) hypertension: Secondary | ICD-10-CM

## 2024-01-04 DIAGNOSIS — F5089 Other specified eating disorder: Secondary | ICD-10-CM | POA: Diagnosis not present

## 2024-01-04 DIAGNOSIS — G4733 Obstructive sleep apnea (adult) (pediatric): Secondary | ICD-10-CM

## 2024-01-04 DIAGNOSIS — E669 Obesity, unspecified: Secondary | ICD-10-CM

## 2024-01-04 DIAGNOSIS — F3289 Other specified depressive episodes: Secondary | ICD-10-CM

## 2024-01-04 MED ORDER — AMLODIPINE BESYLATE 10 MG PO TABS: 10.0000 mg | ORAL_TABLET | Freq: Every day | ORAL | 0 refills | Status: AC

## 2024-01-04 MED ORDER — ESCITALOPRAM OXALATE 20 MG PO TABS: 20.0000 mg | ORAL_TABLET | Freq: Every day | ORAL | 0 refills | Status: AC

## 2024-01-04 MED ORDER — METFORMIN HCL ER 750 MG PO TB24: 750.0000 mg | ORAL_TABLET | Freq: Every day | ORAL | 0 refills | Status: AC

## 2024-01-04 NOTE — Progress Notes (Unsigned)
   SUBJECTIVE:  Chief Complaint: Obesity  Interim History: Patient voices she has been slightly less motivated than she was previously.  She is sometimes journaling protein intake but not calories and isn't getting her exercise in as frequently.  She is wanting to really explore why her motivation is lacking.  She is trying to make decisions based on mental log of calories and protein daily.    Jennifer Frazier is here to discuss her progress with her obesity treatment plan. She is on the keeping a food journal and adhering to recommended goals of 1500 calories and 120 grams of protein and states she is following her eating plan approximately 30-40 % of the time. She states she is walking 30 minutes 5 times for 2 weeks.   OBJECTIVE: Visit Diagnoses: Problem List Items Addressed This Visit   None   Vitals Temp: 97.8 F (36.6 C) BP: 125/84 Pulse Rate: 71 SpO2: 99 %   Anthropometric Measurements Height: 5' 9 (1.753 m) Weight: 241 lb (109.3 kg) BMI (Calculated): 35.57 Weight at Last Visit: 237 lb Weight Lost Since Last Visit: 0 Weight Gained Since Last Visit: 4 Starting Weight: 294 lb Total Weight Loss (lbs): 53 lb (24 kg)   Body Composition  Body Fat %: 45.9 % Fat Mass (lbs): 110.6 lbs Muscle Mass (lbs): 123.8 lbs Total Body Water (lbs): 90.2 lbs Visceral Fat Rating : 12   Other Clinical Data Today's Visit #: 48 Starting Date: 06/24/17 Comments: 1500/120     ASSESSMENT AND PLAN:  Diet: Talli is currently in the action stage of change. As such, her goal is to continue with weight loss efforts and has agreed to keeping a food journal and adhering to recommended goals of 1500 calories and 95 or more grams of protein.   Exercise:  For additional and more extensive health benefits, adults should increase their aerobic physical activity to 300 minutes (5 hours) a week of moderate-intensity, or 150 minutes a week of vigorous-intensity aerobic physical activity, or an equivalent  combination of moderate- and vigorous-intensity activity. Additional health benefits are gained by engaging in physical activity beyond this amount.   Behavior Modification:  We discussed the following Behavioral Modification Strategies today: increasing lean protein intake, decreasing simple carbohydrates, increasing vegetables, meal planning and cooking strategies, holiday eating strategies, planning for success, and keep a strict food journal. We discussed various medication options to help Saudia with her weight loss efforts and we both agreed to ***.  No follow-ups on file.   She was informed of the importance of frequent follow up visits to maximize her success with intensive lifestyle modifications for her multiple health conditions.  Attestation Statements:   Reviewed by clinician on day of visit: allergies, medications, problem list, medical history, surgical history, family history, social history, and previous encounter notes.     Adelita Cho, MD

## 2024-01-16 NOTE — Assessment & Plan Note (Signed)
 Tolerating metformin  750 mg daily.  Needs refill of metformin  today and will not change dosage as patient is not experiencing side effects but is having some good control of intake.

## 2024-01-16 NOTE — Assessment & Plan Note (Signed)
 Blood pressure well-controlled today.  No chest pain, chest pressure, or headache reported.  Needs refill of amlodipine  at current dose.

## 2024-01-16 NOTE — Assessment & Plan Note (Signed)
 Discussed treatment of obstructive sleep apnea with patient today.  May be worthwhile revisiting sleep apnea treatment with patient at subsequent appointments.

## 2024-01-16 NOTE — Assessment & Plan Note (Signed)
 Patient reports improvement of symptoms on Lexapro .  No suicidal or homicidal ideation reported.  Needs refill of Lexapro  today at current dosage.

## 2024-02-01 ENCOUNTER — Ambulatory Visit (INDEPENDENT_AMBULATORY_CARE_PROVIDER_SITE_OTHER): Admitting: Family Medicine

## 2024-02-01 ENCOUNTER — Encounter (INDEPENDENT_AMBULATORY_CARE_PROVIDER_SITE_OTHER): Payer: Self-pay | Admitting: Family Medicine

## 2024-02-01 VITALS — BP 113/77 | HR 72 | Temp 98.1°F | Ht 69.0 in | Wt 240.0 lb

## 2024-02-01 DIAGNOSIS — Z6835 Body mass index (BMI) 35.0-35.9, adult: Secondary | ICD-10-CM | POA: Diagnosis not present

## 2024-02-01 DIAGNOSIS — E559 Vitamin D deficiency, unspecified: Secondary | ICD-10-CM

## 2024-02-01 DIAGNOSIS — E88819 Insulin resistance, unspecified: Secondary | ICD-10-CM

## 2024-02-01 MED ORDER — VITAMIN D (ERGOCALCIFEROL) 1.25 MG (50000 UNIT) PO CAPS: 50000.0000 [IU] | ORAL_CAPSULE | ORAL | 0 refills | Status: AC

## 2024-02-01 NOTE — Progress Notes (Signed)
 "  Office: [phone]  /  Fax: [phone]  WEIGHT SUMMARY AND BIOMETRICS  Anthropometric Measurements Height: 5' 9 (1.753 m) Weight: 240 lb (108.9 kg) BMI (Calculated): 35.43 Weight at Last Visit: 241 lb Weight Lost Since Last Visit: 1 lb Weight Gained Since Last Visit: 0 Starting Weight: 294 lb Total Weight Loss (lbs): 54 lb (24.5 kg) Peak Weight: 294 lb   Body Composition  Body Fat %: 46.4 % Fat Mass (lbs): 111.6 lbs Muscle Mass (lbs): 122.4 lbs Total Body Water (lbs): 89.6 lbs Visceral Fat Rating : 12   Other Clinical Data Fasting: yes Labs: no Today's Visit #: 99 Starting Date: 06/24/17 Comments: 1500+120    Chief Complaint: OBESITY    History of Present Illness Jennifer Frazier is a 49 year old female with obesity and insulin  resistance who presents for obesity treatment and progress assessment.  She has been following a journaling plan with a calorie goal of 1500 and a protein goal of 120 grams, achieving these targets approximately 60% of the time. Her physical activity includes walking four to five days a week for about 15 minutes per session. She reports inadequate hydration and inconsistent intake of fruits and vegetables. Sleep duration is generally less than the recommended seven to nine hours per night. Over the last month, she has lost one pound, including during the holiday period.  She is currently being treated for vitamin D  deficiency with calcifediol 50,000 IU weekly. Her most recent vitamin D  level was 66.3 in August of last year, and she requests a refill of this medication. She is due for lab checks today to reassess her vitamin D  status.  For insulin  resistance, she is on metformin  XR 750 mg daily and is actively working on lifestyle modifications. Her most recent hemoglobin A1c was 5.2 in April of last year, and she is due for lab checks today to monitor her condition.      PHYSICAL EXAM:  Blood pressure 113/77, pulse 72, temperature 98.1  F (36.7 C), height 5' 9 (1.753 m), weight 240 lb (108.9 kg), SpO2 97%. Body mass index is 35.44 kg/m.  DIAGNOSTIC DATA REVIEWED:  BMET    Component Value Date/Time   NA 141 09/13/2023 0000   NA 140 11/04/2016 1500   K 4.5 09/13/2023 0000   K 3.8 11/04/2016 1500   CL 102 09/13/2023 0000   CO2 24 (A) 09/13/2023 0000   CO2 27 11/04/2016 1500   GLUCOSE 81 05/04/2023 1117   GLUCOSE 81 11/13/2019 1321   GLUCOSE 88 11/04/2016 1500   BUN 22 (A) 09/13/2023 0000   BUN 17.6 11/04/2016 1500   CREATININE 1.1 09/13/2023 0000   CREATININE 0.91 05/04/2023 1117   CREATININE 1.1 11/04/2016 1500   CALCIUM 10.1 09/13/2023 0000   CALCIUM 9.6 11/04/2016 1500   GFRNONAA 68 01/30/2020 1155   GFRNONAA >60 11/13/2019 1321   GFRAA 78 01/30/2020 1155   Lab Results  Component Value Date   HGBA1C 5.2 05/04/2023   HGBA1C 5.3 06/24/2017   Lab Results  Component Value Date   INSULIN  6.9 05/04/2023   INSULIN  6.2 06/24/2017   Lab Results  Component Value Date   TSH 1.360 07/27/2023   CBC    Component Value Date/Time   WBC 5.1 09/13/2023 0000   WBC 6.0 11/13/2019 1321   RBC 5.09 09/13/2023 0000   HGB 14.3 09/13/2023 0000   HGB 14.0 07/27/2023 0834   HGB 13.3 11/04/2016 1500   HCT 44 09/13/2023 0000   HCT  44.3 07/27/2023 0834   HCT 40.6 11/04/2016 1500   PLT 238 09/13/2023 0000   PLT 222 07/27/2023 0834   MCV 87 07/27/2023 0834   MCV 84.4 11/04/2016 1500   MCH 27.4 07/27/2023 0834   MCH 28.5 11/13/2019 1321   MCHC 31.6 07/27/2023 0834   MCHC 33.5 11/13/2019 1321   RDW 13.5 07/27/2023 0834   RDW 13.9 11/04/2016 1500   Iron Studies No results found for: IRON, TIBC, FERRITIN, IRONPCTSAT Lipid Panel     Component Value Date/Time   CHOL 174 05/04/2023 1117   TRIG 49 05/04/2023 1117   HDL 92 05/04/2023 1117   LDLCALC 72 05/04/2023 1117   Hepatic Function Panel     Component Value Date/Time   PROT 7.6 05/04/2023 1117   PROT 7.5 11/04/2016 1500   ALBUMIN 4.7 09/13/2023  0000   ALBUMIN 4.5 05/04/2023 1117   ALBUMIN 3.8 11/04/2016 1500   AST 20 09/13/2023 0000   AST 20 11/04/2016 1500   ALT 12 09/13/2023 0000   ALT 15 11/04/2016 1500   ALKPHOS 115 09/13/2023 0000   ALKPHOS 87 11/04/2016 1500   BILITOT 0.3 05/04/2023 1117   BILITOT 0.31 11/04/2016 1500      Component Value Date/Time   TSH 1.360 07/27/2023 0834   Nutritional Lab Results  Component Value Date   VD25OH 66.3 09/13/2023   VD25OH 35.6 05/04/2023   VD25OH 36.9 07/22/2022     Assessment and Plan Assessment & Plan Morbid obesity She has lost one pound over the last month, attributed to journaling, simplifying meals, and reducing coffee intake. She is meeting her calorie and protein goals about 60% of the time, walking 4-5 days a week for 15 minutes, and working on meal planning and preparation. She is not adequately hydrating, not consistently consuming fruits and vegetables, and not getting 7-9 hours of sleep per night. - Continue journaling and simplifying meals. 1500 calories and 100 g protei daily - Encouraged adequate hydration and consumption of fruits and vegetables. - Encouraged aiming for 7-9 hours of sleep per night. - Continue walking 4-5 days a week for 15 minutes. - Encouraged meal planning and preparation to maintain nutritional goals.  Insulin  resistance Managed with diet, exercise, weight loss, and metformin  XR 750 mg daily. Her most recent hemoglobin A1c was 5.2 in April last year. She is due for lab checks to monitor blood sugar and insulin  levels. - Continue metformin  XR 750 mg daily. - Ordered labs to check blood sugar and insulin  levels. - Continue diet, exercise and weight loss as discussed today as an important part of the treatment plan   Vitamin D  deficiency Managed with calcifediol 50,000 IU weekly. Her most recent vitamin D  level was 66.3 in August last year. She is due for lab checks to monitor vitamin D  levels. There is a risk of vitamin D  levels becoming  too high as she loses weight, which could lead to tissue deposition and potential problems. - Refilled calcifediol 50,000 IU weekly. - Ordered labs to check vitamin D  levels. - Instructed to stop vitamin D  if levels reach 100 or above.      Patients who are on anti-obesity medications are counseled on the importance of maintaining healthy lifestyle habits, including balanced nutrition, regular physical activity, and behavioral modifications,  Medication is an adjunct to, not a replacement for, lifestyle changes and that the long-term success and weight maintenance depend on continued adherence to these strategies.   Williemae was informed of the importance of frequent  follow up visits to maximize her success with intensive lifestyle modifications for her obesity and obesity related health conditions as recommended by USPSTF and CMS guidelines  Louann Penton, MD   "

## 2024-02-02 LAB — CMP14+EGFR
ALT: 10 IU/L (ref 0–32)
AST: 17 IU/L (ref 0–40)
Albumin: 4.3 g/dL (ref 3.9–4.9)
Alkaline Phosphatase: 107 IU/L (ref 41–116)
BUN/Creatinine Ratio: 27 — ABNORMAL HIGH (ref 9–23)
BUN: 20 mg/dL (ref 6–24)
Bilirubin Total: 0.5 mg/dL (ref 0.0–1.2)
CO2: 24 mmol/L (ref 20–29)
Calcium: 10.1 mg/dL (ref 8.7–10.2)
Chloride: 103 mmol/L (ref 96–106)
Creatinine, Ser: 0.74 mg/dL (ref 0.57–1.00)
Globulin, Total: 3 g/dL (ref 1.5–4.5)
Glucose: 76 mg/dL (ref 70–99)
Potassium: 4.3 mmol/L (ref 3.5–5.2)
Sodium: 141 mmol/L (ref 134–144)
Total Protein: 7.3 g/dL (ref 6.0–8.5)
eGFR: 99 mL/min/1.73

## 2024-02-02 LAB — HEMOGLOBIN A1C
Est. average glucose Bld gHb Est-mCnc: 100 mg/dL
Hgb A1c MFr Bld: 5.1 % (ref 4.8–5.6)

## 2024-02-02 LAB — VITAMIN B12: Vitamin B-12: 1309 pg/mL — ABNORMAL HIGH (ref 232–1245)

## 2024-02-02 LAB — VITAMIN D 25 HYDROXY (VIT D DEFICIENCY, FRACTURES): Vit D, 25-Hydroxy: 61.3 ng/mL (ref 30.0–100.0)

## 2024-02-28 ENCOUNTER — Ambulatory Visit (INDEPENDENT_AMBULATORY_CARE_PROVIDER_SITE_OTHER): Admitting: Family Medicine

## 2024-03-16 ENCOUNTER — Ambulatory Visit (INDEPENDENT_AMBULATORY_CARE_PROVIDER_SITE_OTHER): Admitting: Family Medicine

## 2024-03-27 ENCOUNTER — Ambulatory Visit (INDEPENDENT_AMBULATORY_CARE_PROVIDER_SITE_OTHER): Admitting: Family Medicine

## 2024-04-10 ENCOUNTER — Ambulatory Visit (INDEPENDENT_AMBULATORY_CARE_PROVIDER_SITE_OTHER): Admitting: Family Medicine
